# Patient Record
Sex: Female | Born: 1965 | Race: White | Hispanic: No | Smoking: Current some day smoker
Health system: Southern US, Community
[De-identification: ages and names within clinical notes are randomized; demographics above are authoritative.]

## PROBLEM LIST (undated history)

## (undated) DIAGNOSIS — N200 Calculus of kidney: Secondary | ICD-10-CM

## (undated) DIAGNOSIS — K429 Umbilical hernia without obstruction or gangrene: Secondary | ICD-10-CM

## (undated) DIAGNOSIS — I639 Cerebral infarction, unspecified: Secondary | ICD-10-CM

## (undated) DIAGNOSIS — R42 Dizziness and giddiness: Secondary | ICD-10-CM

## (undated) DIAGNOSIS — Z8489 Family history of other specified conditions: Secondary | ICD-10-CM

## (undated) DIAGNOSIS — G43409 Hemiplegic migraine, not intractable, without status migrainosus: Secondary | ICD-10-CM

## (undated) DIAGNOSIS — I2510 Atherosclerotic heart disease of native coronary artery without angina pectoris: Secondary | ICD-10-CM

## (undated) DIAGNOSIS — M9979 Connective tissue and disc stenosis of intervertebral foramina of abdomen and other regions: Secondary | ICD-10-CM

## (undated) DIAGNOSIS — K649 Unspecified hemorrhoids: Secondary | ICD-10-CM

## (undated) DIAGNOSIS — F32A Depression, unspecified: Secondary | ICD-10-CM

## (undated) DIAGNOSIS — M359 Systemic involvement of connective tissue, unspecified: Secondary | ICD-10-CM

## (undated) DIAGNOSIS — I2699 Other pulmonary embolism without acute cor pulmonale: Secondary | ICD-10-CM

## (undated) DIAGNOSIS — J329 Chronic sinusitis, unspecified: Secondary | ICD-10-CM

## (undated) DIAGNOSIS — K2270 Barrett's esophagus without dysplasia: Secondary | ICD-10-CM

## (undated) DIAGNOSIS — I739 Peripheral vascular disease, unspecified: Secondary | ICD-10-CM

## (undated) DIAGNOSIS — K635 Polyp of colon: Secondary | ICD-10-CM

## (undated) DIAGNOSIS — S060XAA Concussion with loss of consciousness status unknown, initial encounter: Secondary | ICD-10-CM

## (undated) DIAGNOSIS — T148XXA Other injury of unspecified body region, initial encounter: Secondary | ICD-10-CM

## (undated) DIAGNOSIS — Z87442 Personal history of urinary calculi: Secondary | ICD-10-CM

## (undated) DIAGNOSIS — M797 Fibromyalgia: Secondary | ICD-10-CM

## (undated) DIAGNOSIS — M329 Systemic lupus erythematosus, unspecified: Secondary | ICD-10-CM

## (undated) DIAGNOSIS — I6529 Occlusion and stenosis of unspecified carotid artery: Secondary | ICD-10-CM

## (undated) DIAGNOSIS — G4700 Insomnia, unspecified: Secondary | ICD-10-CM

## (undated) DIAGNOSIS — F419 Anxiety disorder, unspecified: Secondary | ICD-10-CM

## (undated) DIAGNOSIS — K219 Gastro-esophageal reflux disease without esophagitis: Secondary | ICD-10-CM

## (undated) DIAGNOSIS — S0990XS Unspecified injury of head, sequela: Secondary | ICD-10-CM

## (undated) DIAGNOSIS — I8390 Asymptomatic varicose veins of unspecified lower extremity: Secondary | ICD-10-CM

## (undated) DIAGNOSIS — M5136 Other intervertebral disc degeneration, lumbar region: Secondary | ICD-10-CM

## (undated) DIAGNOSIS — M51369 Other intervertebral disc degeneration, lumbar region without mention of lumbar back pain or lower extremity pain: Secondary | ICD-10-CM

## (undated) DIAGNOSIS — G43109 Migraine with aura, not intractable, without status migrainosus: Secondary | ICD-10-CM

## (undated) DIAGNOSIS — E785 Hyperlipidemia, unspecified: Secondary | ICD-10-CM

## (undated) DIAGNOSIS — N6019 Diffuse cystic mastopathy of unspecified breast: Secondary | ICD-10-CM

## (undated) DIAGNOSIS — I10 Essential (primary) hypertension: Secondary | ICD-10-CM

## (undated) DIAGNOSIS — M352 Behcet's disease: Secondary | ICD-10-CM

## (undated) DIAGNOSIS — S060X9A Concussion with loss of consciousness of unspecified duration, initial encounter: Secondary | ICD-10-CM

## (undated) DIAGNOSIS — IMO0001 Reserved for inherently not codable concepts without codable children: Secondary | ICD-10-CM

## (undated) DIAGNOSIS — K5790 Diverticulosis of intestine, part unspecified, without perforation or abscess without bleeding: Secondary | ICD-10-CM

## (undated) DIAGNOSIS — K589 Irritable bowel syndrome without diarrhea: Secondary | ICD-10-CM

## (undated) DIAGNOSIS — J189 Pneumonia, unspecified organism: Secondary | ICD-10-CM

## (undated) DIAGNOSIS — G4730 Sleep apnea, unspecified: Secondary | ICD-10-CM

## (undated) DIAGNOSIS — N390 Urinary tract infection, site not specified: Secondary | ICD-10-CM

## (undated) DIAGNOSIS — F329 Major depressive disorder, single episode, unspecified: Secondary | ICD-10-CM

## (undated) DIAGNOSIS — K760 Fatty (change of) liver, not elsewhere classified: Secondary | ICD-10-CM

## (undated) DIAGNOSIS — G43909 Migraine, unspecified, not intractable, without status migrainosus: Secondary | ICD-10-CM

## (undated) HISTORY — DX: Behcet's disease: M35.2

## (undated) HISTORY — DX: Reserved for inherently not codable concepts without codable children: IMO0001

## (undated) HISTORY — DX: Major depressive disorder, single episode, unspecified: F32.9

## (undated) HISTORY — DX: Chronic sinusitis, unspecified: J32.9

## (undated) HISTORY — DX: Asymptomatic varicose veins of unspecified lower extremity: I83.90

## (undated) HISTORY — PX: BREAST SURGERY: SHX581

## (undated) HISTORY — DX: Diverticulosis of intestine, part unspecified, without perforation or abscess without bleeding: K57.90

## (undated) HISTORY — DX: Hyperlipidemia, unspecified: E78.5

## (undated) HISTORY — DX: Peripheral vascular disease, unspecified: I73.9

## (undated) HISTORY — DX: Concussion with loss of consciousness of unspecified duration, initial encounter: S06.0X9A

## (undated) HISTORY — PX: COLONOSCOPY W/ BIOPSIES: SHX1374

## (undated) HISTORY — DX: Irritable bowel syndrome, unspecified: K58.9

## (undated) HISTORY — DX: Fatty (change of) liver, not elsewhere classified: K76.0

## (undated) HISTORY — DX: Atherosclerotic heart disease of native coronary artery without angina pectoris: I25.10

## (undated) HISTORY — DX: Urinary tract infection, site not specified: N39.0

## (undated) HISTORY — DX: Systemic lupus erythematosus, unspecified: M32.9

## (undated) HISTORY — DX: Depression, unspecified: F32.A

## (undated) HISTORY — DX: Polyp of colon: K63.5

## (undated) HISTORY — DX: Calculus of kidney: N20.0

## (undated) HISTORY — DX: Insomnia, unspecified: G47.00

## (undated) HISTORY — DX: Anxiety disorder, unspecified: F41.9

## (undated) HISTORY — DX: Occlusion and stenosis of unspecified carotid artery: I65.29

## (undated) HISTORY — DX: Diffuse cystic mastopathy of unspecified breast: N60.19

## (undated) HISTORY — DX: Cerebral infarction, unspecified: I63.9

## (undated) HISTORY — PX: OTHER SURGICAL HISTORY: SHX169

## (undated) HISTORY — DX: Other intervertebral disc degeneration, lumbar region: M51.36

## (undated) HISTORY — DX: Connective tissue and disc stenosis of intervertebral foramina of abdomen and other regions: M99.79

## (undated) HISTORY — PX: TUBAL LIGATION: SHX77

## (undated) HISTORY — DX: Concussion with loss of consciousness status unknown, initial encounter: S06.0XAA

## (undated) HISTORY — DX: Umbilical hernia without obstruction or gangrene: K42.9

## (undated) HISTORY — DX: Other intervertebral disc degeneration, lumbar region without mention of lumbar back pain or lower extremity pain: M51.369

## (undated) HISTORY — DX: Migraine with aura, not intractable, without status migrainosus: G43.109

## (undated) HISTORY — DX: Unspecified hemorrhoids: K64.9

---

## 1988-05-30 DIAGNOSIS — IMO0002 Reserved for concepts with insufficient information to code with codable children: Secondary | ICD-10-CM

## 1988-05-30 DIAGNOSIS — M329 Systemic lupus erythematosus, unspecified: Secondary | ICD-10-CM

## 1988-05-30 HISTORY — DX: Systemic lupus erythematosus, unspecified: M32.9

## 1988-05-30 HISTORY — DX: Reserved for concepts with insufficient information to code with codable children: IMO0002

## 1998-05-30 HISTORY — PX: BREAST EXCISIONAL BIOPSY: SUR124

## 2001-05-30 DIAGNOSIS — I6529 Occlusion and stenosis of unspecified carotid artery: Secondary | ICD-10-CM

## 2001-05-30 HISTORY — DX: Occlusion and stenosis of unspecified carotid artery: I65.29

## 2007-05-31 DIAGNOSIS — IMO0001 Reserved for inherently not codable concepts without codable children: Secondary | ICD-10-CM

## 2007-05-31 HISTORY — DX: Reserved for inherently not codable concepts without codable children: IMO0001

## 2008-08-19 LAB — HM COLONOSCOPY

## 2009-05-30 DIAGNOSIS — M9979 Connective tissue and disc stenosis of intervertebral foramina of abdomen and other regions: Secondary | ICD-10-CM

## 2009-05-30 HISTORY — DX: Connective tissue and disc stenosis of intervertebral foramina of abdomen and other regions: M99.79

## 2009-09-10 LAB — HM PAP SMEAR: HM Pap smear: NORMAL

## 2010-12-12 LAB — HM PAP SMEAR: HM Pap smear: NORMAL

## 2011-05-31 DIAGNOSIS — N200 Calculus of kidney: Secondary | ICD-10-CM

## 2011-05-31 DIAGNOSIS — K429 Umbilical hernia without obstruction or gangrene: Secondary | ICD-10-CM

## 2011-05-31 DIAGNOSIS — K760 Fatty (change of) liver, not elsewhere classified: Secondary | ICD-10-CM

## 2011-05-31 HISTORY — DX: Fatty (change of) liver, not elsewhere classified: K76.0

## 2011-05-31 HISTORY — DX: Calculus of kidney: N20.0

## 2011-05-31 HISTORY — DX: Umbilical hernia without obstruction or gangrene: K42.9

## 2011-07-29 LAB — HM COLONOSCOPY

## 2012-03-22 LAB — HM MAMMOGRAPHY

## 2014-05-08 ENCOUNTER — Encounter: Payer: Self-pay | Admitting: Podiatry

## 2014-05-08 ENCOUNTER — Ambulatory Visit (INDEPENDENT_AMBULATORY_CARE_PROVIDER_SITE_OTHER): Payer: BC Managed Care – PPO

## 2014-05-08 ENCOUNTER — Ambulatory Visit (INDEPENDENT_AMBULATORY_CARE_PROVIDER_SITE_OTHER): Payer: BC Managed Care – PPO | Admitting: Podiatry

## 2014-05-08 VITALS — BP 156/100 | HR 88 | Resp 16 | Ht 65.0 in | Wt 260.0 lb

## 2014-05-08 DIAGNOSIS — M722 Plantar fascial fibromatosis: Secondary | ICD-10-CM

## 2014-05-08 DIAGNOSIS — M79672 Pain in left foot: Secondary | ICD-10-CM

## 2014-05-08 MED ORDER — MELOXICAM 7.5 MG PO TABS: 7.5000 mg | ORAL_TABLET | Freq: Every day | ORAL | Status: DC

## 2014-05-08 NOTE — Progress Notes (Signed)
Subjective:    Patient ID: Shirley Rogers, female    DOB: [birth year], 48 y.o.   MRN: [id number]  HPI Comments: 48 year old female presents the office today with complaints of left heel pain which has been ongoing for approximate one month. 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 She states that she can barely put pressure on her foot without any discomfort. She states that although the pain is in present for approximate one month and has become progressively worse. She does have pain particularly in the mornings or after periods of rest. She is previously been diagnosed with plantar fasciitis while living in [place] and she has had a steroid injection which helped. She states that this pain is about the same as it was prior although worse. She states this feels as if she is walking on glass and almost feels like a bone is "breaking". She's been performing stretching exercises as well as rolling a cold water bottle under her foot and wearing various inserts in her shoes without any resolution of symptoms. She is also been soaking in Epson salts. She denies any recent injury or trauma to the area.   Patient also has secondary complaints stating that she feels as if the second MTPJ  (which she pointed to) is painful and becoming  "dislocated". Denies any recent injury or trauma to the area. No prior treatment for this. No other complaints at this time.  Foot Pain Associated symptoms include fatigue, headaches and weakness.      Review of Systems  Constitutional: Positive for fatigue.  HENT: Positive for ear pain.   Gastrointestinal:       Bloating   Musculoskeletal: Positive for back pain.       Joint pain Rash Difficulty walking Muscle pain   Allergic/Immunologic: Positive for environmental allergies.  Neurological: Positive for weakness and headaches.  Hematological: Bruises/bleeds easily.  All other systems reviewed and are negative.      Objective:    Physical Exam AAO x3, NAD DP/PT pulses palpable bilaterally, CRT less than 3 seconds Protective sensation intact with Simms Weinstein monofilament, vibratory sensation intact, Achilles tendon reflex intact Tenderness palpation along the plantar medial tubercle of the calcaneus at the insertion of the plantar fashion left foot. There is no pain along the course of plantar fascial in the arch of the foot. There is no pain with lateral compression of the calcaneus or pain with vibratory sensation. No pain on the posterior aspect of the calcaneus or pain on the course last insertion of the Achilles tendon. MMT 5/5, ROM WNL. There is no overlying edema, erythema, increased warmth. Mild edema overlying the second MTPJ on the left foot. There is mild pain with range of motion of second MTPJ. There is no pain with palpation or vibratory sensation are on the digit or metatarsals No overlying erythema or increase in warmth.  No open lesions or pre-ulcerative lesions. No pain with calf compression, swelling, warmth, erythema.       Assessment & Plan:  48 year old female with left heel pain, likely plantar fasciitis vs. stress fracture; left second MTPJ likely capsulitis. -X-rays were obtained and reviewed with the patient. -At this time will immobilize and a CAM walker. Risks, complications of immobilization including DVT/PE were discussed the patient injection to the emergency room should any occur. -Ice to the area. -Prescribed meloxicam. -Discussed possible steroid injection however we'll hold off until next appointment we'll repeat x-rays. -Follow-up in  2 weeks for repeat x-rays or sooner if any problems are to arise. In the meantime, call the office in the questions, concerns, change in symptoms. Follow-up with PCP for other issues mentioned in the review of systems.

## 2014-05-19 ENCOUNTER — Ambulatory Visit: Payer: Self-pay | Admitting: Family Medicine

## 2014-05-19 ENCOUNTER — Encounter: Payer: Self-pay | Admitting: Family Medicine

## 2014-05-20 NOTE — Progress Notes (Signed)
error 

## 2014-05-25 DIAGNOSIS — W19XXXA Unspecified fall, initial encounter: Secondary | ICD-10-CM | POA: Insufficient documentation

## 2014-05-27 ENCOUNTER — Ambulatory Visit (INDEPENDENT_AMBULATORY_CARE_PROVIDER_SITE_OTHER): Payer: BC Managed Care – PPO | Admitting: Podiatry

## 2014-05-27 ENCOUNTER — Ambulatory Visit (INDEPENDENT_AMBULATORY_CARE_PROVIDER_SITE_OTHER): Payer: BC Managed Care – PPO

## 2014-05-27 VITALS — BP 140/90 | HR 94 | Resp 16

## 2014-05-27 DIAGNOSIS — M722 Plantar fascial fibromatosis: Secondary | ICD-10-CM

## 2014-05-27 MED ORDER — MELOXICAM 15 MG PO TABS: 15.0000 mg | ORAL_TABLET | Freq: Every day | ORAL | Status: DC

## 2014-05-27 MED ORDER — TRIAMCINOLONE ACETONIDE 10 MG/ML IJ SUSP
10.0000 mg | Freq: Once | INTRAMUSCULAR | Status: AC
  Administered 2014-05-27: 10 mg

## 2014-05-27 NOTE — Patient Instructions (Signed)
Plantar Fasciitis (Heel Spur Syndrome) with Rehab The plantar fascia is a fibrous, ligament-like, soft-tissue structure that spans the bottom of the foot. Plantar fasciitis is a condition that causes pain in the foot due to inflammation of the tissue. SYMPTOMS   Pain and tenderness on the underneath side of the foot.  Pain that worsens with standing or walking. CAUSES  Plantar fasciitis is caused by irritation and injury to the plantar fascia on the underneath side of the foot. Common mechanisms of injury include:  Direct trauma to bottom of the foot.  Damage to a small nerve that runs under the foot where the main fascia attaches to the heel bone.  Stress placed on the plantar fascia due to bone spurs. RISK INCREASES WITH:   Activities that place stress on the plantar fascia (running, jumping, pivoting, or cutting).  Poor strength and flexibility.  Improperly fitted shoes.  Tight calf muscles.  Flat feet.  Failure to warm-up properly before activity.  Obesity. PREVENTION  Warm up and stretch properly before activity.  Allow for adequate recovery between workouts.  Maintain physical fitness:  Strength, flexibility, and endurance.  Cardiovascular fitness.  Maintain a health body weight.  Avoid stress on the plantar fascia.  Wear properly fitted shoes, including arch supports for individuals who have flat feet. PROGNOSIS  If treated properly, then the symptoms of plantar fasciitis usually resolve without surgery. However, occasionally surgery is necessary. RELATED COMPLICATIONS   Recurrent symptoms that may result in a chronic condition.  Problems of the lower back that are caused by compensating for the injury, such as limping.  Pain or weakness of the foot during push-off following surgery.  Chronic inflammation, scarring, and partial or complete fascia tear, occurring more often from repeated injections. TREATMENT  Treatment initially involves the use of  ice and medication to help reduce pain and inflammation. The use of strengthening and stretching exercises may help reduce pain with activity, especially stretches of the Achilles tendon. These exercises may be performed at home or with a therapist. Your caregiver may recommend that you use heel cups of arch supports to help reduce stress on the plantar fascia. Occasionally, corticosteroid injections are given to reduce inflammation. If symptoms persist for greater than 6 months despite non-surgical (conservative), then surgery may be recommended.  MEDICATION   If pain medication is necessary, then nonsteroidal anti-inflammatory medications, such as aspirin and ibuprofen, or other minor pain relievers, such as acetaminophen, are often recommended.  Do not take pain medication within 7 days before surgery.  Prescription pain relievers may be given if deemed necessary by your caregiver. Use only as directed and only as much as you need.  Corticosteroid injections may be given by your caregiver. These injections should be reserved for the most serious cases, because they may only be given a certain number of times. HEAT AND COLD  Cold treatment (icing) relieves pain and reduces inflammation. Cold treatment should be applied for 10 to 15 minutes every 2 to 3 hours for inflammation and pain and immediately after any activity that aggravates your symptoms. Use ice packs or massage the area with a piece of ice (ice massage).  Heat treatment may be used prior to performing the stretching and strengthening activities prescribed by your caregiver, physical therapist, or athletic trainer. Use a heat pack or soak the injury in warm water. SEEK IMMEDIATE MEDICAL CARE IF:  Treatment seems to offer no benefit, or the condition worsens.  Any medications produce adverse side effects. EXERCISES RANGE   OF MOTION (ROM) AND STRETCHING EXERCISES - Plantar Fasciitis (Heel Spur Syndrome) These exercises may help you  when beginning to rehabilitate your injury. Your symptoms may resolve with or without further involvement from your physician, physical therapist or athletic trainer. While completing these exercises, remember:   Restoring tissue flexibility helps normal motion to return to the joints. This allows healthier, less painful movement and activity.  An effective stretch should be held for at least 30 seconds.  A stretch should never be painful. You should only feel a gentle lengthening or release in the stretched tissue. RANGE OF MOTION - Toe Extension, Flexion  Sit with your right / left leg crossed over your opposite knee.  Grasp your toes and gently pull them back toward the top of your foot. You should feel a stretch on the bottom of your toes and/or foot.  Hold this stretch for __________ seconds.  Now, gently pull your toes toward the bottom of your foot. You should feel a stretch on the top of your toes and or foot.  Hold this stretch for __________ seconds. Repeat __________ times. Complete this stretch __________ times per day.  RANGE OF MOTION - Ankle Dorsiflexion, Active Assisted  Remove shoes and sit on a chair that is preferably not on a carpeted surface.  Place right / left foot under knee. Extend your opposite leg for support.  Keeping your heel down, slide your right / left foot back toward the chair until you feel a stretch at your ankle or calf. If you do not feel a stretch, slide your bottom forward to the edge of the chair, while still keeping your heel down.  Hold this stretch for __________ seconds. Repeat __________ times. Complete this stretch __________ times per day.  STRETCH - Gastroc, Standing  Place hands on wall.  Extend right / left leg, keeping the front knee somewhat bent.  Slightly point your toes inward on your back foot.  Keeping your right / left heel on the floor and your knee straight, shift your weight toward the wall, not allowing your back to  arch.  You should feel a gentle stretch in the right / left calf. Hold this position for __________ seconds. Repeat __________ times. Complete this stretch __________ times per day. STRETCH - Soleus, Standing  Place hands on wall.  Extend right / left leg, keeping the other knee somewhat bent.  Slightly point your toes inward on your back foot.  Keep your right / left heel on the floor, bend your back knee, and slightly shift your weight over the back leg so that you feel a gentle stretch deep in your back calf.  Hold this position for __________ seconds. Repeat __________ times. Complete this stretch __________ times per day. STRETCH - Gastrocsoleus, Standing  Note: This exercise can place a lot of stress on your foot and ankle. Please complete this exercise only if specifically instructed by your caregiver.   Place the ball of your right / left foot on a step, keeping your other foot firmly on the same step.  Hold on to the wall or a rail for balance.  Slowly lift your other foot, allowing your body weight to press your heel down over the edge of the step.  You should feel a stretch in your right / left calf.  Hold this position for __________ seconds.  Repeat this exercise with a slight bend in your right / left knee. Repeat __________ times. Complete this stretch __________ times per day.    STRENGTHENING EXERCISES - Plantar Fasciitis (Heel Spur Syndrome)  These exercises may help you when beginning to rehabilitate your injury. They may resolve your symptoms with or without further involvement from your physician, physical therapist or athletic trainer. While completing these exercises, remember:   Muscles can gain both the endurance and the strength needed for everyday activities through controlled exercises.  Complete these exercises as instructed by your physician, physical therapist or athletic trainer. Progress the resistance and repetitions only as guided. STRENGTH -  Towel Curls  Sit in a chair positioned on a non-carpeted surface.  Place your foot on a towel, keeping your heel on the floor.  Pull the towel toward your heel by only curling your toes. Keep your heel on the floor.  If instructed by your physician, physical therapist or athletic trainer, add ____________________ at the end of the towel. Repeat __________ times. Complete this exercise __________ times per day. STRENGTH - Ankle Inversion  Secure one end of a rubber exercise band/tubing to a fixed object (table, pole). Loop the other end around your foot just before your toes.  Place your fists between your knees. This will focus your strengthening at your ankle.  Slowly, pull your big toe up and in, making sure the band/tubing is positioned to resist the entire motion.  Hold this position for __________ seconds.  Have your muscles resist the band/tubing as it slowly pulls your foot back to the starting position. Repeat __________ times. Complete this exercises __________ times per day.  Document Released: 05/16/2005 Document Revised: 08/08/2011 Document Reviewed: 08/28/2008 [employer] Patient Information 2015 [employer]. This information is not intended to replace advice given to you by your health care provider. Make sure you discuss any questions you have with your health care provider.  

## 2014-05-27 NOTE — Progress Notes (Signed)
Patient ID: Shirley Rogers, female   DOB: 1965/06/30, 48 y.o.   MRN: [id number]  Subjective: 48 year old female returns the office in follow-up evaluation of left heel pain. The patient has been immobilized in a cam boot. She states that the pain is somewhat better although it persists. She's been taking the meloxicam without any difficulties. She denies any acute changes since last appointment and no other complaints at this time. Denies any recent injury or trauma to the area. No fevers, chills, nausea, vomiting or other systemic complaints. No other complaints at this time.  Objective: AAO 3, NAD DP/PT pulses palpable, CRT less than 3 seconds Protective sensation intact with Simms Weinstein monofilament, vibratory sensation intact, Achilles tendon reflex intact. Tenderness to palpation overlying the plantar medial tubercle of the calcaneus on the left foot at the insertion of the plantar fascia. There is no pain on the course of plantar fascial within the arch of the foot and the plantar fascia appears to be intact. There is no pain with lateral compression of the calcaneus or pain with vibratory sensation. No pain along the posterior aspect of the calcaneus or along the course/insertion of the Achilles tendon. There is no overlying edema, erythema, increase in warmth. No other areas of pinpoint bony tenderness or pain with vibratory sensation. MMT 5/5, ROM WNL No open lesions or pre-ulcerative lesions. No pain with calf compression, swelling, warmth, erythema.  Assessment: 48 year old female left heel pain, likely plantar fasciitis  Plan: -New x-rays were obtained and reviewed the patient. -Treatment options were discussed including alternatives, risks, competitions. -Patient elects to proceed with steroid injection into the left plantar heel. Under sterile skin preparation, a total of 2.5cc of kenalog 10, 0.5% Marcaine plain, and 2% lidocaine plain were infiltrated into the symptomatic area  without complication. A band-aid was applied. Patient tolerated the injection well without complication. Discussed the patient to ice the area over the next couple days to help prevent a steroid flare. -Dispensed plantar fascial brace. -Patient can transition into a regular sneaker as tolerated with the plantar fascial brace. -Continue ice and stretching exercises. -Continue meloxicam. A new prescription was sent to the pharmacy. -Discussed shoe gear modifications and possible orthotics. -Follow-up in 3 weeks or sooner should any problems arise. In the meantime, call the office with any questions, concerns, change in symptoms.

## 2014-06-07 ENCOUNTER — Emergency Department: Payer: Self-pay | Admitting: Emergency Medicine

## 2014-06-07 LAB — URINALYSIS, COMPLETE
Bilirubin,UR: NEGATIVE
GLUCOSE, UR: NEGATIVE mg/dL (ref 0–75)
Ketone: NEGATIVE
LEUKOCYTE ESTERASE: NEGATIVE
NITRITE: NEGATIVE
PH: 6 (ref 4.5–8.0)
Protein: 100
RBC,UR: 3547 /HPF (ref 0–5)
SPECIFIC GRAVITY: 1.005 (ref 1.003–1.030)
Squamous Epithelial: 5
WBC UR: 120 /HPF (ref 0–5)

## 2014-06-07 LAB — COMPREHENSIVE METABOLIC PANEL
ALT: 41 U/L
Albumin: 4.2 g/dL (ref 3.4–5.0)
Alkaline Phosphatase: 79 U/L
Anion Gap: 7 (ref 7–16)
BILIRUBIN TOTAL: 0.4 mg/dL (ref 0.2–1.0)
BUN: 9 mg/dL (ref 7–18)
CALCIUM: 8.8 mg/dL (ref 8.5–10.1)
CHLORIDE: 102 mmol/L (ref 98–107)
CREATININE: 0.87 mg/dL (ref 0.60–1.30)
Co2: 29 mmol/L (ref 21–32)
EGFR (Non-African Amer.): >60
Glucose: 103 mg/dL — ABNORMAL HIGH (ref 65–99)
Osmolality: 275 (ref 275–301)
Potassium: 4 mmol/L (ref 3.5–5.1)
SGOT(AST): 22 U/L (ref 15–37)
Sodium: 138 mmol/L (ref 136–145)
TOTAL PROTEIN: 7.9 g/dL (ref 6.4–8.2)

## 2014-06-07 LAB — CBC WITH DIFFERENTIAL/PLATELET
BASOS PCT: 0.8 %
Basophil #: 0.1 10*3/uL (ref 0.0–0.1)
EOS PCT: 1.6 %
Eosinophil #: 0.1 10*3/uL (ref 0.0–0.7)
HCT: 44.1 % (ref 35.0–47.0)
HGB: 14.5 g/dL (ref 12.0–16.0)
LYMPHS PCT: 24.3 %
Lymphocyte #: 2 10*3/uL (ref 1.0–3.6)
MCH: 29.8 pg (ref 26.0–34.0)
MCHC: 32.9 g/dL (ref 32.0–36.0)
MCV: 91 fL (ref 80–100)
Monocyte #: 0.5 x10 3/mm (ref 0.2–0.9)
Monocyte %: 5.8 %
NEUTROS PCT: 67.5 %
Neutrophil #: 5.5 10*3/uL (ref 1.4–6.5)
PLATELETS: 245 10*3/uL (ref 150–440)
RBC: 4.85 10*6/uL (ref 3.80–5.20)
RDW: 13 % (ref 11.5–14.5)
WBC: 8.1 10*3/uL (ref 3.6–11.0)

## 2014-06-07 LAB — HCG, QUANTITATIVE, PREGNANCY: Beta Hcg, Quant.: <1 m[IU]/mL — ABNORMAL LOW

## 2014-06-07 LAB — GC/CHLAMYDIA PROBE AMP

## 2014-06-17 ENCOUNTER — Ambulatory Visit (INDEPENDENT_AMBULATORY_CARE_PROVIDER_SITE_OTHER): Payer: BC Managed Care – PPO | Admitting: Podiatry

## 2014-06-17 VITALS — BP 148/93 | HR 89 | Resp 16

## 2014-06-17 DIAGNOSIS — M722 Plantar fascial fibromatosis: Secondary | ICD-10-CM

## 2014-06-17 MED ORDER — TRIAMCINOLONE ACETONIDE 10 MG/ML IJ SUSP: 10.0000 mg | Freq: Once | INTRAMUSCULAR | Status: DC

## 2014-06-17 NOTE — Patient Instructions (Signed)
Plantar Fasciitis (Heel Spur Syndrome) with Rehab The plantar fascia is a fibrous, ligament-like, soft-tissue structure that spans the bottom of the foot. Plantar fasciitis is a condition that causes pain in the foot due to inflammation of the tissue. SYMPTOMS   Pain and tenderness on the underneath side of the foot.  Pain that worsens with standing or walking. CAUSES  Plantar fasciitis is caused by irritation and injury to the plantar fascia on the underneath side of the foot. Common mechanisms of injury include:  Direct trauma to bottom of the foot.  Damage to a small nerve that runs under the foot where the main fascia attaches to the heel bone.  Stress placed on the plantar fascia due to bone spurs. RISK INCREASES WITH:   Activities that place stress on the plantar fascia (running, jumping, pivoting, or cutting).  Poor strength and flexibility.  Improperly fitted shoes.  Tight calf muscles.  Flat feet.  Failure to warm-up properly before activity.  Obesity. PREVENTION  Warm up and stretch properly before activity.  Allow for adequate recovery between workouts.  Maintain physical fitness:  Strength, flexibility, and endurance.  Cardiovascular fitness.  Maintain a health body weight.  Avoid stress on the plantar fascia.  Wear properly fitted shoes, including arch supports for individuals who have flat feet. PROGNOSIS  If treated properly, then the symptoms of plantar fasciitis usually resolve without surgery. However, occasionally surgery is necessary. RELATED COMPLICATIONS   Recurrent symptoms that may result in a chronic condition.  Problems of the lower back that are caused by compensating for the injury, such as limping.  Pain or weakness of the foot during push-off following surgery.  Chronic inflammation, scarring, and partial or complete fascia tear, occurring more often from repeated injections. TREATMENT  Treatment initially involves the use of  ice and medication to help reduce pain and inflammation. The use of strengthening and stretching exercises may help reduce pain with activity, especially stretches of the Achilles tendon. These exercises may be performed at home or with a therapist. Your caregiver may recommend that you use heel cups of arch supports to help reduce stress on the plantar fascia. Occasionally, corticosteroid injections are given to reduce inflammation. If symptoms persist for greater than 6 months despite non-surgical (conservative), then surgery may be recommended.  MEDICATION   If pain medication is necessary, then nonsteroidal anti-inflammatory medications, such as aspirin and ibuprofen, or other minor pain relievers, such as acetaminophen, are often recommended.  Do not take pain medication within 7 days before surgery.  Prescription pain relievers may be given if deemed necessary by your caregiver. Use only as directed and only as much as you need.  Corticosteroid injections may be given by your caregiver. These injections should be reserved for the most serious cases, because they may only be given a certain number of times. HEAT AND COLD  Cold treatment (icing) relieves pain and reduces inflammation. Cold treatment should be applied for 10 to 15 minutes every 2 to 3 hours for inflammation and pain and immediately after any activity that aggravates your symptoms. Use ice packs or massage the area with a piece of ice (ice massage).  Heat treatment may be used prior to performing the stretching and strengthening activities prescribed by your caregiver, physical therapist, or athletic trainer. Use a heat pack or soak the injury in warm water. SEEK IMMEDIATE MEDICAL CARE IF:  Treatment seems to offer no benefit, or the condition worsens.  Any medications produce adverse side effects. EXERCISES RANGE   OF MOTION (ROM) AND STRETCHING EXERCISES - Plantar Fasciitis (Heel Spur Syndrome) These exercises may help you  when beginning to rehabilitate your injury. Your symptoms may resolve with or without further involvement from your physician, physical therapist or athletic trainer. While completing these exercises, remember:   Restoring tissue flexibility helps normal motion to return to the joints. This allows healthier, less painful movement and activity.  An effective stretch should be held for at least 30 seconds.  A stretch should never be painful. You should only feel a gentle lengthening or release in the stretched tissue. RANGE OF MOTION - Toe Extension, Flexion  Sit with your right / left leg crossed over your opposite knee.  Grasp your toes and gently pull them back toward the top of your foot. You should feel a stretch on the bottom of your toes and/or foot.  Hold this stretch for __________ seconds.  Now, gently pull your toes toward the bottom of your foot. You should feel a stretch on the top of your toes and or foot.  Hold this stretch for __________ seconds. Repeat __________ times. Complete this stretch __________ times per day.  RANGE OF MOTION - Ankle Dorsiflexion, Active Assisted  Remove shoes and sit on a chair that is preferably not on a carpeted surface.  Place right / left foot under knee. Extend your opposite leg for support.  Keeping your heel down, slide your right / left foot back toward the chair until you feel a stretch at your ankle or calf. If you do not feel a stretch, slide your bottom forward to the edge of the chair, while still keeping your heel down.  Hold this stretch for __________ seconds. Repeat __________ times. Complete this stretch __________ times per day.  STRETCH - Gastroc, Standing  Place hands on wall.  Extend right / left leg, keeping the front knee somewhat bent.  Slightly point your toes inward on your back foot.  Keeping your right / left heel on the floor and your knee straight, shift your weight toward the wall, not allowing your back to  arch.  You should feel a gentle stretch in the right / left calf. Hold this position for __________ seconds. Repeat __________ times. Complete this stretch __________ times per day. STRETCH - Soleus, Standing  Place hands on wall.  Extend right / left leg, keeping the other knee somewhat bent.  Slightly point your toes inward on your back foot.  Keep your right / left heel on the floor, bend your back knee, and slightly shift your weight over the back leg so that you feel a gentle stretch deep in your back calf.  Hold this position for __________ seconds. Repeat __________ times. Complete this stretch __________ times per day. STRETCH - Gastrocsoleus, Standing  Note: This exercise can place a lot of stress on your foot and ankle. Please complete this exercise only if specifically instructed by your caregiver.   Place the ball of your right / left foot on a step, keeping your other foot firmly on the same step.  Hold on to the wall or a rail for balance.  Slowly lift your other foot, allowing your body weight to press your heel down over the edge of the step.  You should feel a stretch in your right / left calf.  Hold this position for __________ seconds.  Repeat this exercise with a slight bend in your right / left knee. Repeat __________ times. Complete this stretch __________ times per day.    STRENGTHENING EXERCISES - Plantar Fasciitis (Heel Spur Syndrome)  These exercises may help you when beginning to rehabilitate your injury. They may resolve your symptoms with or without further involvement from your physician, physical therapist or athletic trainer. While completing these exercises, remember:   Muscles can gain both the endurance and the strength needed for everyday activities through controlled exercises.  Complete these exercises as instructed by your physician, physical therapist or athletic trainer. Progress the resistance and repetitions only as guided. STRENGTH -  Towel Curls  Sit in a chair positioned on a non-carpeted surface.  Place your foot on a towel, keeping your heel on the floor.  Pull the towel toward your heel by only curling your toes. Keep your heel on the floor.  If instructed by your physician, physical therapist or athletic trainer, add ____________________ at the end of the towel. Repeat __________ times. Complete this exercise __________ times per day. STRENGTH - Ankle Inversion  Secure one end of a rubber exercise band/tubing to a fixed object (table, pole). Loop the other end around your foot just before your toes.  Place your fists between your knees. This will focus your strengthening at your ankle.  Slowly, pull your big toe up and in, making sure the band/tubing is positioned to resist the entire motion.  Hold this position for __________ seconds.  Have your muscles resist the band/tubing as it slowly pulls your foot back to the starting position. Repeat __________ times. Complete this exercises __________ times per day.  Document Released: 05/16/2005 Document Revised: 08/08/2011 Document Reviewed: 08/28/2008 [employer] Patient Information 2015 [employer]. This information is not intended to replace advice given to you by your health care provider. Make sure you discuss any questions you have with your health care provider.  

## 2014-06-18 NOTE — Progress Notes (Signed)
Patient ID: Shirley Rogers, female   DOB: [birth year], 49 y.o.   MRN: [id number]  Subjective: 49 year old female presented to the office they for follow-up evaluation of left heel pain and plantar fasciitis. She does state that her pain has improved greatly compared to last appointment although she does continue to have some discomfort into the heel. She's been continuing with the stretching, icing and various over-the-counter braces. Denies any systemic complaints such as fevers, chills, nausea, vomiting. No acute changes since last appointment, and no other complaints at this time.   Objective: AAO x3, NAD DP/PT pulses palpable bilaterally, CRT less than 3 seconds Protective sensation intact with Simms Weinstein monofilament, vibratory sensation intact, Achilles tendon reflex intact There is tenderness palpation overlying the plantar medial tubercle of the calcaneus at the left foot on the insertion of the plantar fascia. There is no pain along the course of plantar fascial in the arch of the foot and the ligament appears to be intact. There is no pain along the posterior aspect of the calcaneus from a core/insertion of the Achilles tendon. No pain with lateral compression of the calcaneus or pain with vibratory sensation. No other areas of tenderness to bilateral lower extremities. MMT 5/5, ROM WNL. No edema, erythema, increase in warmth to bilateral lower extremities.  No open lesions or pre-ulcerative lesions.  No pain with calf compression, swelling, warmth, erythema  Assessment: 49 year old female with resolving left heel pain, plantar fasciitis.  Plan: -All treatment options discussed with the patient including all alternatives, risks, complications.  -Patient elects to proceed with steroid injection into the left plantar heel. Under sterile skin preparation, a total of 2.5cc of kenalog 10, 0.5% Marcaine plain, and 2% lidocaine plain were infiltrated into the symptomatic area without  complication. A band-aid was applied. Patient tolerated the injection well without complication. Post-injection care with discussed with the patient. Discussed with the patient to ice the area over the next couple of days to help prevent a steroid flare.  -Plantar fascial taking was applied. -Continue with the stretching, icing, braces. -Continue his shoe gear modifications and inserts. -Follow-up in 3 weeks or sooner should any problems arise. -Patient encouraged to call the office with any questions, concerns, change in symptoms.

## 2014-07-15 ENCOUNTER — Ambulatory Visit: Payer: BC Managed Care – PPO | Admitting: Podiatry

## 2014-07-17 ENCOUNTER — Ambulatory Visit (INDEPENDENT_AMBULATORY_CARE_PROVIDER_SITE_OTHER): Payer: BC Managed Care – PPO | Admitting: Podiatry

## 2014-07-17 VITALS — BP 134/79 | HR 93 | Resp 16

## 2014-07-17 DIAGNOSIS — S99922S Unspecified injury of left foot, sequela: Secondary | ICD-10-CM

## 2014-07-17 DIAGNOSIS — M722 Plantar fascial fibromatosis: Secondary | ICD-10-CM

## 2014-07-17 DIAGNOSIS — S8992XS Unspecified injury of left lower leg, sequela: Secondary | ICD-10-CM

## 2014-07-17 DIAGNOSIS — M779 Enthesopathy, unspecified: Secondary | ICD-10-CM

## 2014-07-20 NOTE — Progress Notes (Signed)
Patient ID: Shirley Rogers, female   DOB: Oct 04, 1965, 49 y.o.   MRN: [id number]  Subjective: 49 year old female presented to the office they for follow-up evaluation of left heel pain/plantar fasciitis and symptomatic left second digit. She states that since last appointment her heel pain has resolved and she no longer has any issues with the area. She does that she continues to have some discomfort overlying the left second digit particularly with ambulation and weightbearing. She states that she gets intermittent swelling overlying the second toe joint and discomfort. Denies any recent injury or trauma. No other complaints at this time. No acute changes since last appointment. Denies any systemic complaints as fevers, chills, nausea, vomiting.  Objective: AAO x3, NAD DP/PT pulses palpable bilaterally, CRT less than 3 seconds Protective sensation intact with Simms Weinstein monofilament, vibratory sensation intact, Achilles tendon reflex intact There is no tenderness to palpation overlying the plantar medial tubercle of the calcaneus at the left foot at the insertion of the plantar fascia at this time. There is no pain along the course of plantar fascial in the arch of the foot and the ligament appears to be intact. There is no pain along the posterior aspect of the calcaneus from a core/insertion of the Achilles tendon. No pain with lateral compression of the calcaneus or pain with vibratory sensation. There is mild discomfort along the second MTPJ plantarly with a trace amount of edema around both the plantar and dorsal aspect of the joint. There is no overlying erythema or increase in warmth. There is mild pain with range of motion of the second MTPJ. Upon performing the Lachman test, the left 2nd MTPJ did not appear to be grossly unstable compared to the right.  No other areas of tenderness to bilateral lower extremities. MMT 5/5, ROM WNL. No edema, erythema, increase in warmth to bilateral lower  extremities.  No open lesions or pre-ulcerative lesions.  No pain with calf compression, swelling, warmth, erythema  Assessment: 49 year old female withresolved left plantar fasciitis; left second toe pain, likely early plantar plate injury/capsulitis   Plan: -All treatment options discussed with the patient including all alternatives, risks, complications.  -In regards the plantar fasciitis recommended her to continue the stretching and icing activities as well she gear modifications to help prevent recurrence. -Regards the left second digit discussed likely etiology of her symptoms. Continue anti-inflammatory medications as needed. Discussed with her how to tape the digit down to help take pressure off the plantar plate. Would hold off on steroid injection at this time. Discussed with her long-term sequela of this kind of injury. -Follow-up in 4 weeks if symptoms haven't resolved. In the meantime, encouraged to call the office with any questions, concerns, change in symptoms. -Patient encouraged to call the office with any questions, concerns, change in symptoms.

## 2014-07-22 ENCOUNTER — Emergency Department: Payer: Self-pay | Admitting: Emergency Medicine

## 2014-11-04 ENCOUNTER — Other Ambulatory Visit: Payer: Self-pay

## 2014-11-04 ENCOUNTER — Emergency Department: Payer: Worker's Compensation

## 2014-11-04 ENCOUNTER — Encounter: Payer: Self-pay | Admitting: *Deleted

## 2014-11-04 ENCOUNTER — Emergency Department
Admission: EM | Admit: 2014-11-04 | Discharge: 2014-11-04 | Disposition: A | Discharge status: Home / Self Care | Payer: Worker's Compensation | Attending: Emergency Medicine | Admitting: Emergency Medicine

## 2014-11-04 DIAGNOSIS — G43109 Migraine with aura, not intractable, without status migrainosus: Secondary | ICD-10-CM | POA: Insufficient documentation

## 2014-11-04 DIAGNOSIS — I10 Essential (primary) hypertension: Secondary | ICD-10-CM | POA: Diagnosis not present

## 2014-11-04 DIAGNOSIS — Z720 Tobacco use: Secondary | ICD-10-CM | POA: Diagnosis not present

## 2014-11-04 DIAGNOSIS — M6281 Muscle weakness (generalized): Secondary | ICD-10-CM | POA: Diagnosis not present

## 2014-11-04 DIAGNOSIS — Z791 Long term (current) use of non-steroidal anti-inflammatories (NSAID): Secondary | ICD-10-CM | POA: Insufficient documentation

## 2014-11-04 DIAGNOSIS — R51 Headache: Secondary | ICD-10-CM | POA: Diagnosis present

## 2014-11-04 HISTORY — DX: Hemiplegic migraine, not intractable, without status migrainosus: G43.409

## 2014-11-04 HISTORY — DX: Barrett's esophagus without dysplasia: K22.70

## 2014-11-04 HISTORY — DX: Essential (primary) hypertension: I10

## 2014-11-04 MED ORDER — DIPHENHYDRAMINE HCL 50 MG/ML IJ SOLN
INTRAMUSCULAR | Status: AC
  Administered 2014-11-04: 50 mg via INTRAVENOUS
  Filled 2014-11-04: qty 1

## 2014-11-04 MED ORDER — KETOROLAC TROMETHAMINE 30 MG/ML IJ SOLN
INTRAMUSCULAR | Status: AC
  Administered 2014-11-04: 30 mg via INTRAVENOUS
  Filled 2014-11-04: qty 1

## 2014-11-04 MED ORDER — DIPHENHYDRAMINE HCL 50 MG/ML IJ SOLN
50.0000 mg | Freq: Once | INTRAMUSCULAR | Status: AC
  Administered 2014-11-04: 50 mg via INTRAVENOUS

## 2014-11-04 MED ORDER — METHYLPREDNISOLONE SODIUM SUCC 125 MG IJ SOLR
INTRAMUSCULAR | Status: AC
  Administered 2014-11-04: 125 mg via INTRAVENOUS
  Filled 2014-11-04: qty 2

## 2014-11-04 MED ORDER — METOCLOPRAMIDE HCL 5 MG/ML IJ SOLN
INTRAMUSCULAR | Status: AC
  Administered 2014-11-04: 10 mg via INTRAVENOUS
  Filled 2014-11-04: qty 2

## 2014-11-04 MED ORDER — METOCLOPRAMIDE HCL 5 MG/ML IJ SOLN
10.0000 mg | Freq: Once | INTRAMUSCULAR | Status: AC
  Administered 2014-11-04: 10 mg via INTRAVENOUS

## 2014-11-04 MED ORDER — DIPHENHYDRAMINE HCL 25 MG PO CAPS: 50.0000 mg | ORAL_CAPSULE | Freq: Four times a day (QID) | ORAL | Status: DC | PRN

## 2014-11-04 MED ORDER — METHYLPREDNISOLONE SODIUM SUCC 125 MG IJ SOLR
125.0000 mg | Freq: Once | INTRAMUSCULAR | Status: AC
  Administered 2014-11-04: 125 mg via INTRAVENOUS

## 2014-11-04 MED ORDER — METOCLOPRAMIDE HCL 10 MG PO TABS: 10.0000 mg | ORAL_TABLET | Freq: Three times a day (TID) | ORAL | Status: DC

## 2014-11-04 MED ORDER — SODIUM CHLORIDE 0.9 % IV BOLUS (SEPSIS)
1000.0000 mL | Freq: Once | INTRAVENOUS | Status: AC
  Administered 2014-11-04: 1000 mL via INTRAVENOUS

## 2014-11-04 MED ORDER — KETOROLAC TROMETHAMINE 30 MG/ML IJ SOLN
30.0000 mg | Freq: Once | INTRAMUSCULAR | Status: AC
  Administered 2014-11-04: 30 mg via INTRAVENOUS

## 2014-11-04 NOTE — ED Notes (Signed)
Pulled WC Employer Profile and no UDS, Breath Analysis, or Blood Alcohol required.

## 2014-11-04 NOTE — ED Provider Notes (Signed)
[facility] Emergency Department Provider Note  ____________________________________________  Time seen: 2:30 PM  I have reviewed the triage vital signs and the nursing notes.   HISTORY  Chief Complaint Migraine and Neurologic Problem    HPI Shirley Rogers is a 49 y.o. female who complains of a migraine headache and weakness of the right side of her body that started at 1:00 PM today. 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 Her current symptoms are completely consistent with her long-standing migraine history and there is nothing new about it. She is quite confident that she's not having a stroke and that this is all related to her usual migraines. No vision changes nausea vomiting. 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 All little chemical fumes are often a trigger for her. She gets her care at the [facility] clinic and gets annual three-day suppression therapy.  Currently she complains of weakness in the right side of the face, right weakness in the right hand, paresthesia in the right forearm and hand, and mild weakness in the right leg. At symptom onset she was unable to speak and unable to walk. She now reports that she is feeling much improved after receiving oxygen nonrebreather by EMS as able speak and can walk with a slightly unsteady gait.   Past Medical History  Diagnosis Date  . Hypertension   . Barrett esophagus   . Diverticulitis   . Hemiplegic migraine     There are no active problems to display for this patient.   History reviewed. No pertinent past surgical history.  Current Outpatient Rx  Name  Route  Sig  Dispense  Refill  . diphenhydrAMINE (BENADRYL) 25 mg capsule   Oral   Take 2 capsules (50 mg total) by mouth every 6 (six) hours as  needed.   60 capsule   0   . meloxicam (MOBIC) 15 MG tablet   Oral   Take 1 tablet (15 mg total) by mouth daily.   30 tablet   2   . metoCLOPramide (REGLAN) 10 MG tablet   Oral   Take 1 tablet (10 mg total) by mouth 4 (four) times daily -  before meals and at bedtime.   60 tablet   0     Allergies Biaxin; Morphine and related; Other; and Rocephin  History reviewed. No pertinent family history.  Social History History  Substance Use Topics  . Smoking status: Current Some Day Smoker -- 4.00 packs/day for 1 years    Types: Cigarettes  . Smokeless tobacco: Not on file  . Alcohol Use: 0.0 oz/week    0 Standard drinks or equivalent per week     Comment: social    Review of Systems  Constitutional: No fever or chills. No weight changes Eyes:No blurry vision or double vision.  ENT: No sore throat. Cardiovascular: No chest pain. Respiratory: No dyspnea or cough. Gastrointestinal: Negative for abdominal pain, vomiting and diarrhea.  No BRBPR or melena. Genitourinary: Negative for dysuria, urinary retention, bloody urine, or difficulty urinating. Musculoskeletal: Negative for back pain. No joint swelling or pain. Skin: Negative for rash. Neurological: Right-sided headache and right weakness as above.Marland Kitchen Psychiatric:No anxiety or depression.   Endocrine:No hot/cold intolerance, changes in energy, or sleep difficulty.  10-point ROS otherwise negative.  ____________________________________________   PHYSICAL  EXAM:  VITAL SIGNS: ED Triage Vitals  Enc Vitals Group     BP 11/04/14 1439 146/113 mmHg     Pulse Rate 11/04/14 1439 98     Resp 11/04/14 1439 16     Temp 11/04/14 1439 98.6 F (37 C)     Temp Source 11/04/14 1439 Oral     SpO2 11/04/14 1439 100 %     Weight 11/04/14 1439 243 lb (110.224 kg)     Height 11/04/14 1439 5\' 5"  (1.651 m)     Head Cir --      Peak Flow --      Pain Score --      Pain Loc --      Pain Edu? --      Excl. in [place]? --       Constitutional: Alert and oriented. Well appearing and in no distress. Eyes: No scleral icterus. No conjunctival pallor. PERRL. EOMI ENT   Head: Normocephalic and atraumatic.   Nose: No congestion/rhinnorhea. No septal hematoma   Mouth/Throat: MMM, no pharyngeal erythema. No peritonsillar mass. No uvula shift.   Neck: No stridor. No SubQ emphysema. No meningismus. Hematological/Lymphatic/Immunilogical: No cervical lymphadenopathy. Cardiovascular: RRR. Normal and symmetric distal pulses are present in all extremities. No murmurs, rubs, or gallops. Respiratory: Normal respiratory effort without tachypnea nor retractions. Breath sounds are clear and equal bilaterally. No wheezes/rales/rhonchi. Gastrointestinal: Soft and nontender. No distention. There is no CVA tenderness.  No rebound, rigidity, or guarding. Genitourinary: deferred Musculoskeletal: Nontender with normal range of motion in all extremities. No joint effusions.  No lower extremity tenderness.  No edema. Neurologic:   Dysarthria Right eyelid weakness, right facial droop, tongue deviation to right. Right forehead is spared and is symmetric with the left..  No pronator drift. Right upper extremity is weak in the grip and bicep and tricep function. She had does have 4 out of 6 strength in these areas. The left upper external is normal. Bilateral lower external ears are normal strength. She does report paresthesia to light touch in the right forearm and hand.  Normal finger to nose testing.  Skin:  Skin is warm, dry and intact. No rash noted.  No petechiae, purpura, or bullae. Psychiatric: Mood and affect are normal. Speech and behavior are normal. Patient exhibits appropriate insight and judgment.  ____________________________________________    LABS (pertinent positives/negatives) (all labs ordered are listed, but only abnormal results are displayed) Labs Reviewed - No data to  display ____________________________________________   EKG  Interpreted by me Normal sinus rhythm rate 99, normal axis and intervals, normal QRS and ST segments and T waves.  ____________________________________________    RADIOLOGY  CT head unremarkable  ____________________________________________   PROCEDURES  ____________________________________________   INITIAL IMPRESSION / ASSESSMENT AND PLAN / ED COURSE  Pertinent labs & imaging results that were available during my care of the patient were reviewed by me and considered in my medical decision making (see chart for details).  Patient presents with a complicated migraine that is completely consistent with her long-standing migraine history for which she sees specialist at [facility] clinic. I did discuss with the patient the concern that her hypertension and neurologic deficits to making a [place] distribution stroke are concerning and advise proceeding with a CT scan of the head. She is agreeable with this. However, we'll also treat her with medications for migraine and plan to discharge her home if the CT scan is negative. Her neurologic exam is somewhat inconsistent with acute stroke and does  provide some reassurance that this is all due to, located migraine, in addition to the patient's very clear history that states that she's had symptoms like this many times before. The patient is very well versed with her medical history and is able to describe her current symptoms as a "complicated migraine with hemiplegia".  ____________________________________________   FINAL CLINICAL IMPRESSION(S) / ED DIAGNOSES  Final diagnoses:  Complicated migraine      Carrie Mew, MD 11/04/14 1625

## 2014-11-04 NOTE — ED Notes (Signed)
Pt here with neuro symptoms.  Pt has hx of hemiplegic migraines.  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  Pt had right side facial drooping, right side weakness, and became dizzy.  Pt advises that her migraines causes her symptoms.  Advises she goes to [facility] annually for treatments for same.  EMS report pt symptoms much better now than when they arrived.

## 2014-11-04 NOTE — Discharge Instructions (Signed)

## 2014-12-03 ENCOUNTER — Encounter: Payer: Self-pay | Admitting: Urgent Care

## 2014-12-03 ENCOUNTER — Emergency Department: Payer: BC Managed Care – PPO

## 2014-12-03 ENCOUNTER — Emergency Department
Admission: EM | Admit: 2014-12-03 | Discharge: 2014-12-03 | Disposition: A | Discharge status: Home / Self Care | Payer: BC Managed Care – PPO | Attending: Emergency Medicine | Admitting: Emergency Medicine

## 2014-12-03 DIAGNOSIS — Y9289 Other specified places as the place of occurrence of the external cause: Secondary | ICD-10-CM | POA: Diagnosis not present

## 2014-12-03 DIAGNOSIS — Y998 Other external cause status: Secondary | ICD-10-CM | POA: Diagnosis not present

## 2014-12-03 DIAGNOSIS — Y9389 Activity, other specified: Secondary | ICD-10-CM | POA: Diagnosis not present

## 2014-12-03 DIAGNOSIS — Z79899 Other long term (current) drug therapy: Secondary | ICD-10-CM | POA: Insufficient documentation

## 2014-12-03 DIAGNOSIS — W2209XA Striking against other stationary object, initial encounter: Secondary | ICD-10-CM | POA: Insufficient documentation

## 2014-12-03 DIAGNOSIS — I10 Essential (primary) hypertension: Secondary | ICD-10-CM | POA: Insufficient documentation

## 2014-12-03 DIAGNOSIS — Z720 Tobacco use: Secondary | ICD-10-CM | POA: Diagnosis not present

## 2014-12-03 DIAGNOSIS — S0990XA Unspecified injury of head, initial encounter: Secondary | ICD-10-CM | POA: Diagnosis present

## 2014-12-03 DIAGNOSIS — S060X0A Concussion without loss of consciousness, initial encounter: Secondary | ICD-10-CM | POA: Insufficient documentation

## 2014-12-03 DIAGNOSIS — Z791 Long term (current) use of non-steroidal anti-inflammatories (NSAID): Secondary | ICD-10-CM | POA: Insufficient documentation

## 2014-12-03 HISTORY — DX: Migraine, unspecified, not intractable, without status migrainosus: G43.909

## 2014-12-03 MED ORDER — METOCLOPRAMIDE HCL 10 MG PO TABS: 10.0000 mg | ORAL_TABLET | Freq: Four times a day (QID) | ORAL | Status: DC | PRN

## 2014-12-03 NOTE — ED Notes (Signed)
Gayle,MD consulted. MD made aware of presenting complaints and triage assessment. MD with VORB for CT head without contrast. Orders to be entered and carried by this RN.

## 2014-12-03 NOTE — Discharge Instructions (Signed)
Concussion A concussion, or closed-head injury, is a brain injury caused by a direct blow to the head or by a quick and sudden movement (jolt) of the head or neck. Concussions are usually not life-threatening. Even so, the effects of a concussion can be serious. If you have had a concussion before, you are more likely to experience concussion-like symptoms after a direct blow to the head.  CAUSES  Direct blow to the head, such as from running into another player during a soccer game, being hit in a fight, or hitting your head on a hard surface.  A jolt of the head or neck that causes the brain to move back and forth inside the skull, such as in a car crash. SIGNS AND SYMPTOMS The signs of a concussion can be hard to notice. Early on, they may be missed by you, family members, and health care providers. You may look fine but act or feel differently. Symptoms are usually temporary, but they may last for days, weeks, or even longer. Some symptoms may appear right away while others may not show up for hours or days. Every head injury is different. Symptoms include:  Mild to moderate headaches that will not go away.  A feeling of pressure inside your head.  Having more trouble than usual:  Learning or remembering things you have heard.  Answering questions.  Paying attention or concentrating.  Organizing daily tasks.  Making decisions and solving problems.  Slowness in thinking, acting or reacting, speaking, or reading.  Getting lost or being easily confused.  Feeling tired all the time or lacking energy (fatigued).  Feeling drowsy.  Sleep disturbances.  Sleeping more than usual.  Sleeping less than usual.  Trouble falling asleep.  Trouble sleeping (insomnia).  Loss of balance or feeling lightheaded or dizzy.  Nausea or vomiting.  Numbness or tingling.  Increased sensitivity to:  Sounds.  Lights.  Distractions.  Vision problems or eyes that tire  easily.  Diminished sense of taste or smell.  Ringing in the ears.  Mood changes such as feeling sad or anxious.  Becoming easily irritated or angry for little or no reason.  Lack of motivation.  Seeing or hearing things other people do not see or hear (hallucinations). DIAGNOSIS Your health care provider can usually diagnose a concussion based on a description of your injury and symptoms. He or she will ask whether you passed out (lost consciousness) and whether you are having trouble remembering events that happened right before and during your injury. Your evaluation might include:  A brain scan to look for signs of injury to the brain. Even if the test shows no injury, you may still have a concussion.  Blood tests to be sure other problems are not present. TREATMENT  Concussions are usually treated in an emergency department, in urgent care, or at a clinic. You may need to stay in the hospital overnight for further treatment.  Tell your health care provider if you are taking any medicines, including prescription medicines, over-the-counter medicines, and natural remedies. Some medicines, such as blood thinners (anticoagulants) and aspirin, may increase the chance of complications. Also tell your health care provider whether you have had alcohol or are taking illegal drugs. This information may affect treatment.  Your health care provider will send you home with important instructions to follow.  How fast you will recover from a concussion depends on many factors. These factors include how severe your concussion is, what part of your brain was injured, your  age, and how healthy you were before the concussion.  Most people with mild injuries recover fully. Recovery can take time. In general, recovery is slower in older persons. Also, persons who have had a concussion in the past or have other medical problems may find that it takes longer to recover from their current injury. HOME  CARE INSTRUCTIONS General Instructions  Carefully follow the directions your health care provider gave you.  Only take over-the-counter or prescription medicines for pain, discomfort, or fever as directed by your health care provider.  Take only those medicines that your health care provider has approved.  Do not drink alcohol until your health care provider says you are well enough to do so. Alcohol and certain other drugs may slow your recovery and can put you at risk of further injury.  If it is harder than usual to remember things, write them down.  If you are easily distracted, try to do one thing at a time. For example, do not try to watch TV while fixing dinner.  Talk with family members or close friends when making important decisions.  Keep all follow-up appointments. Repeated evaluation of your symptoms is recommended for your recovery.  Watch your symptoms and tell others to do the same. Complications sometimes occur after a concussion. Older adults with a brain injury may have a higher risk of serious complications, such as a blood clot on the brain.  Tell your teachers, school nurse, school counselor, coach, athletic trainer, or work Freight forwarder about your injury, symptoms, and restrictions. Tell them about what you can or cannot do. They should watch for:  Increased problems with attention or concentration.  Increased difficulty remembering or learning new information.  Increased time needed to complete tasks or assignments.  Increased irritability or decreased ability to cope with stress.  Increased symptoms.  Rest. Rest helps the brain to heal. Make sure you:  Get plenty of sleep at night. Avoid staying up late at night.  Keep the same bedtime hours on weekends and weekdays.  Rest during the day. Take daytime naps or rest breaks when you feel tired.  Limit activities that require a lot of thought or concentration. These include:  Doing homework or job-related  work.  Watching TV.  Working on the computer.  Avoid any situation where there is potential for another head injury (football, hockey, soccer, basketball, martial arts, downhill snow sports and horseback riding). Your condition will get worse every time you experience a concussion. You should avoid these activities until you are evaluated by the appropriate follow-up health care providers. Returning To Your Regular Activities You will need to return to your normal activities slowly, not all at once. You must give your body and brain enough time for recovery.  Do not return to sports or other athletic activities until your health care provider tells you it is safe to do so.  Ask your health care provider when you can drive, ride a bicycle, or operate heavy machinery. Your ability to react may be slower after a brain injury. Never do these activities if you are dizzy.  Ask your health care provider about when you can return to work or school. Preventing Another Concussion It is very important to avoid another brain injury, especially before you have recovered. In rare cases, another injury can lead to permanent brain damage, brain swelling, or death. The risk of this is greatest during the first 7-10 days after a head injury. Avoid injuries by:  Wearing a seat  belt when riding in a car.  Drinking alcohol only in moderation.  Wearing a helmet when biking, skiing, skateboarding, skating, or doing similar activities.  Avoiding activities that could lead to a second concussion, such as contact or recreational sports, until your health care provider says it is okay.  Taking safety measures in your home.  Remove clutter and tripping hazards from floors and stairways.  Use grab bars in bathrooms and handrails by stairs.  Place non-slip mats on floors and in bathtubs.  Improve lighting in dim areas. SEEK MEDICAL CARE IF:  You have increased problems paying attention or  concentrating.  You have increased difficulty remembering or learning new information.  You need more time to complete tasks or assignments than before.  You have increased irritability or decreased ability to cope with stress.  You have more symptoms than before. Seek medical care if you have any of the following symptoms for more than 2 weeks after your injury:  Lasting (chronic) headaches.  Dizziness or balance problems.  Nausea.  Vision problems.  Increased sensitivity to noise or light.  Depression or mood swings.  Anxiety or irritability.  Memory problems.  Difficulty concentrating or paying attention.  Sleep problems.  Feeling tired all the time. SEEK IMMEDIATE MEDICAL CARE IF:  You have severe or worsening headaches. These may be a sign of a blood clot in the brain.  You have weakness (even if only in one hand, leg, or part of the face).  You have numbness.  You have decreased coordination.  You vomit repeatedly.  You have increased sleepiness.  One pupil is larger than the other.  You have convulsions.  You have slurred speech.  You have increased confusion. This may be a sign of a blood clot in the brain.  You have increased restlessness, agitation, or irritability.  You are unable to recognize people or places.  You have neck pain.  It is difficult to wake you up.  You have unusual behavior changes.  You lose consciousness. MAKE SURE YOU:  Understand these instructions.  Will watch your condition.  Will get help right away if you are not doing well or get worse. Document Released: 08/06/2003 Document Revised: 05/21/2013 Document Reviewed: 12/06/2012 [facility] Patient Information 2015 [place], [place]. This information is not intended to replace advice given to you by your health care provider. Make sure you discuss any questions you have with your health care provider.     As we have discussed please get plenty of rest, avoid  stimulation. Please take Tylenol or Motrin as needed for any headache, Reglan as needed for nausea, as prescribed. Please follow-up your primary care doctor soon as possible.

## 2014-12-03 NOTE — ED Provider Notes (Signed)
[facility] Emergency Department Provider Note  Time seen: 9:09 PM  I have reviewed the triage vital signs and the nursing notes.   HISTORY  Chief Complaint Head Injury and Dizziness    HPI Shirley Rogers is a 49 y.o. female with a past medical history of hypertension, reflux, migraines with hemiplegic migraines presents the emergency department after a head injury. According to the patient 3 days ago she hit her head on the corner of a ping-pong table. She states since that time she has felt foggy, having trouble concentrating and off balance at times. Patient also states nausea. Denies any current headache, denies any confusion, slurred speech, focal weakness or numbness at any time. Denies fever. Denies any other injuries. Describes her symptoms as moderate.     Past Medical History  Diagnosis Date  . Hypertension   . Barrett esophagus   . Diverticulitis   . Hemiplegic migraine   . Migraine     There are no active problems to display for this patient.   Past Surgical History  Procedure Laterality Date  . Tubal ligation      Current Outpatient Rx  Name  Route  Sig  Dispense  Refill  . diphenhydrAMINE (BENADRYL) 25 mg capsule   Oral   Take 2 capsules (50 mg total) by mouth every 6 (six) hours as needed.   60 capsule   0   . meloxicam (MOBIC) 15 MG tablet   Oral   Take 1 tablet (15 mg total) by mouth daily.   30 tablet   2   . metoCLOPramide (REGLAN) 10 MG tablet   Oral   Take 1 tablet (10 mg total) by mouth 4 (four) times daily -  before meals and at bedtime.   60 tablet   0     Allergies Biaxin; Morphine and related; Other; and Rocephin  No family history on file.  Social History History  Substance Use Topics  . Smoking status: Current Some Day Smoker -- 4.00 packs/day for 1 years    Types: Cigarettes  . Smokeless tobacco: Not on file  . Alcohol Use: 0.0 oz/week    0 Standard drinks or equivalent per week      Comment: social    Review of Systems Constitutional: Negative for fever. Cardiovascular: Negative for chest pain. Respiratory: Negative for shortness of breath. Gastrointestinal: Negative for abdominal pain, positive for nausea. Musculoskeletal: Negative for back pain. Neurological: Negative for headaches, focal weakness or numbness.  10-point ROS otherwise negative.  ____________________________________________   PHYSICAL EXAM:  VITAL SIGNS: ED Triage Vitals  Enc Vitals Group     BP 12/03/14 1916 143/96 mmHg     Pulse Rate 12/03/14 1916 93     Resp 12/03/14 1916 18     Temp 12/03/14 1916 98.5 F (36.9 C)     Temp Source 12/03/14 1916 Oral     SpO2 12/03/14 1916 98 %     Weight 12/03/14 1916 238 lb (107.956 kg)     Height 12/03/14 1916 5\' 5"  (1.651 m)     Head Cir --      Peak Flow --      Pain Score 12/03/14 1917 6     Pain Loc --      Pain Edu? --      Excl. in [place]? --     Constitutional: Alert and oriented. Well appearing and in no distress. Eyes: Normal exam, 3 mm PERRL bilaterally. ENT   Head: Normocephalic.  Mild tenderness to palpation of the right temple. No hematoma or laceration noted.   Nose: No congestion/rhinnorhea.   Mouth/Throat: Mucous membranes are moist. Cardiovascular: Normal rate, regular rhythm. No murmur Respiratory: Normal respiratory effort without tachypnea nor retractions. Breath sounds are clear  Gastrointestinal: Soft and nontender. No distention.   Musculoskeletal: Nontender with normal range of motion in all extremities.  Neurologic:  Normal speech and language. No gross focal neurologic deficits are appreciated. Speech is normal. No pronator drift. 5/5 motor. Skin:  Skin is warm, dry and intact.  Psychiatric: Mood and affect are normal. Speech and behavior are normal.  ____________________________________________      RADIOLOGY  CT head shows no acute  abnormalities.  ____________________________________________   INITIAL IMPRESSION / ASSESSMENT AND PLAN / ED COURSE  Pertinent labs & imaging results that were available during my care of the patient were reviewed by me and considered in my medical decision making (see chart for details).  Patient with symptoms 3 days status post head injury. Symptoms most consistent with concussion. CT head shows no acute abnormalities. I discussed with the patient Tylenol/Motrin as needed, for any headaches. We will prescribe Reglan as needed for any nausea as the patient states this works very well for her. I discussed with the patient staying home from work, with plenty of rest and avoiding stimulation. Patient agreeable to plan we'll discharge the patient home with primary care follow-up. Patient agreeable.  ____________________________________________   FINAL CLINICAL IMPRESSION(S) / ED DIAGNOSES  concussion   Harvest Dark, MD 12/03/14 2112

## 2014-12-03 NOTE — ED Notes (Signed)
Patient presents with c/o severe dizziness, nausea, and pre-syncopal feelings since Sunday. Patient reports that she was sitting in the floor on Sunday and turned her head - hit head on a table. Patient advising that it did not knoock her out, however she "saw stars." Patient reporting that her equilibrium is off and she has been "falling to the side" today.

## 2014-12-26 ENCOUNTER — Emergency Department
Admission: EM | Admit: 2014-12-26 | Discharge: 2014-12-26 | Disposition: A | Discharge status: Home / Self Care | Payer: BC Managed Care – PPO | Attending: Emergency Medicine | Admitting: Emergency Medicine

## 2014-12-26 ENCOUNTER — Emergency Department: Payer: BC Managed Care – PPO

## 2014-12-26 DIAGNOSIS — Z720 Tobacco use: Secondary | ICD-10-CM | POA: Insufficient documentation

## 2014-12-26 DIAGNOSIS — Y9389 Activity, other specified: Secondary | ICD-10-CM | POA: Insufficient documentation

## 2014-12-26 DIAGNOSIS — Z79899 Other long term (current) drug therapy: Secondary | ICD-10-CM | POA: Diagnosis not present

## 2014-12-26 DIAGNOSIS — Y998 Other external cause status: Secondary | ICD-10-CM | POA: Diagnosis not present

## 2014-12-26 DIAGNOSIS — Y9289 Other specified places as the place of occurrence of the external cause: Secondary | ICD-10-CM | POA: Insufficient documentation

## 2014-12-26 DIAGNOSIS — S3992XA Unspecified injury of lower back, initial encounter: Secondary | ICD-10-CM | POA: Diagnosis present

## 2014-12-26 DIAGNOSIS — I10 Essential (primary) hypertension: Secondary | ICD-10-CM | POA: Diagnosis not present

## 2014-12-26 DIAGNOSIS — S161XXA Strain of muscle, fascia and tendon at neck level, initial encounter: Secondary | ICD-10-CM | POA: Insufficient documentation

## 2014-12-26 DIAGNOSIS — S0990XA Unspecified injury of head, initial encounter: Secondary | ICD-10-CM | POA: Insufficient documentation

## 2014-12-26 DIAGNOSIS — S39012A Strain of muscle, fascia and tendon of lower back, initial encounter: Secondary | ICD-10-CM | POA: Insufficient documentation

## 2014-12-26 DIAGNOSIS — T148 Other injury of unspecified body region: Secondary | ICD-10-CM | POA: Insufficient documentation

## 2014-12-26 MED ORDER — TRAMADOL HCL 50 MG PO TABS
ORAL_TABLET | ORAL | Status: AC
  Administered 2014-12-26: 50 mg via ORAL
  Filled 2014-12-26: qty 1

## 2014-12-26 MED ORDER — TRAMADOL HCL 50 MG PO TABS
50.0000 mg | ORAL_TABLET | Freq: Once | ORAL | Status: AC
  Administered 2014-12-26: 50 mg via ORAL

## 2014-12-26 MED ORDER — HYDROCODONE-ACETAMINOPHEN 5-325 MG PO TABS: 1.0000 | ORAL_TABLET | ORAL | Status: DC | PRN

## 2014-12-26 MED ORDER — DIPHENHYDRAMINE HCL 50 MG/ML IJ SOLN
50.0000 mg | Freq: Once | INTRAMUSCULAR | Status: AC
  Administered 2014-12-26: 50 mg via INTRAVENOUS
  Filled 2014-12-26: qty 1

## 2014-12-26 MED ORDER — KETOROLAC TROMETHAMINE 30 MG/ML IJ SOLN
30.0000 mg | Freq: Once | INTRAMUSCULAR | Status: AC
  Administered 2014-12-26: 30 mg via INTRAVENOUS
  Filled 2014-12-26: qty 1

## 2014-12-26 MED ORDER — METOCLOPRAMIDE HCL 5 MG/ML IJ SOLN
10.0000 mg | Freq: Once | INTRAMUSCULAR | Status: AC
  Administered 2014-12-26: 10 mg via INTRAVENOUS
  Filled 2014-12-26: qty 2

## 2014-12-26 MED ORDER — SODIUM CHLORIDE 0.9 % IV BOLUS (SEPSIS)
1000.0000 mL | Freq: Once | INTRAVENOUS | Status: AC
  Administered 2014-12-26: 1000 mL via INTRAVENOUS
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
## 2014-12-26 NOTE — ED Notes (Signed)
Patient transported to X-ray 

## 2014-12-26 NOTE — Discharge Instructions (Signed)
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
## 2014-12-26 NOTE — ED Provider Notes (Signed)
-----------------------------------------   10:26 PM on 12/26/2014 -----------------------------------------  Care was assumed from Dr. Kerman Passey at approximately 10 PM pending results of x-ray of the L-spine and pelvis which are negative for any acute traumatic pathology. Pain improved. Patient is hypertensive but asymptomatic. Pain improved. Will discharge with return precautions, close PCP follow-up. Discussed that she needs to have her blood pressure rechecked by a doctor in one week. She voices understanding of this.  Joanne Gavel, MD 12/29/14 [phone]

## 2014-12-26 NOTE — ED Notes (Signed)
Pt up to the bathroom

## 2014-12-26 NOTE — ED Notes (Signed)
Pt had diagnosis of concussion 3 weeks ago after hitting head on ping pong table

## 2015-01-06 ENCOUNTER — Emergency Department
Admission: EM | Admit: 2015-01-06 | Discharge: 2015-01-06 | Disposition: A | Discharge status: Home / Self Care | Payer: BC Managed Care – PPO | Attending: Emergency Medicine | Admitting: Emergency Medicine

## 2015-01-06 ENCOUNTER — Encounter: Payer: Self-pay | Admitting: *Deleted

## 2015-01-06 DIAGNOSIS — R51 Headache: Secondary | ICD-10-CM | POA: Insufficient documentation

## 2015-01-06 DIAGNOSIS — Z720 Tobacco use: Secondary | ICD-10-CM | POA: Diagnosis not present

## 2015-01-06 DIAGNOSIS — R42 Dizziness and giddiness: Secondary | ICD-10-CM | POA: Diagnosis present

## 2015-01-06 DIAGNOSIS — Z79899 Other long term (current) drug therapy: Secondary | ICD-10-CM | POA: Insufficient documentation

## 2015-01-06 DIAGNOSIS — I10 Essential (primary) hypertension: Secondary | ICD-10-CM | POA: Insufficient documentation

## 2015-01-06 LAB — BASIC METABOLIC PANEL
Anion gap: 8 (ref 5–15)
BUN: 17 mg/dL (ref 6–20)
CALCIUM: 8.9 mg/dL (ref 8.9–10.3)
CHLORIDE: 103 mmol/L (ref 101–111)
CO2: 29 mmol/L (ref 22–32)
CREATININE: 0.68 mg/dL (ref 0.44–1.00)
GFR calc Af Amer: >60 mL/min (ref >60)
GFR calc non Af Amer: >60 mL/min (ref >60)
Glucose, Bld: 133 mg/dL — ABNORMAL HIGH (ref 65–99)
POTASSIUM: 4 mmol/L (ref 3.5–5.1)
SODIUM: 140 mmol/L (ref 135–145)

## 2015-01-06 LAB — CBC
HEMATOCRIT: 41.9 % (ref 35.0–47.0)
HEMOGLOBIN: 13.9 g/dL (ref 12.0–16.0)
MCH: 29.2 pg (ref 26.0–34.0)
MCHC: 33.3 g/dL (ref 32.0–36.0)
MCV: 87.7 fL (ref 80.0–100.0)
Platelets: 231 10*3/uL (ref 150–440)
RBC: 4.78 MIL/uL (ref 3.80–5.20)
RDW: 13 % (ref 11.5–14.5)
WBC: 7.9 10*3/uL (ref 3.6–11.0)

## 2015-01-06 LAB — URINALYSIS COMPLETE WITH MICROSCOPIC (ARMC ONLY)
Bilirubin Urine: NEGATIVE
GLUCOSE, UA: NEGATIVE mg/dL
Hgb urine dipstick: NEGATIVE
KETONES UR: NEGATIVE mg/dL
Nitrite: NEGATIVE
PROTEIN: NEGATIVE mg/dL
Specific Gravity, Urine: 1.024 (ref 1.005–1.030)
pH: 5 (ref 5.0–8.0)

## 2015-01-06 MED ORDER — MECLIZINE HCL 25 MG PO TABS: 25.0000 mg | ORAL_TABLET | Freq: Three times a day (TID) | ORAL | Status: DC | PRN

## 2015-01-06 MED ORDER — MECLIZINE HCL 25 MG PO TABS
25.0000 mg | ORAL_TABLET | Freq: Once | ORAL | Status: AC
  Administered 2015-01-06: 25 mg via ORAL
  Filled 2015-01-06: qty 1

## 2015-01-06 NOTE — ED Provider Notes (Signed)
[facility] Emergency Department Provider Note  Time seen: 9:20 PM  I have reviewed the triage vital signs and the nursing notes.   HISTORY  Chief Complaint Dizziness    HPI Shirley Rogers is a 49 y.o. female who presents the emergency department with dizziness. Patient states several months of dizziness, continues to affect her. Patient does not have a local PCP, has not seen neurology or ENT. Patient has been seen several times for the same, with normal CT scans in the past per the patient denies any headache today, but states she often has a headache. Denies any focal weakness or numbness. Patient states the dizziness is largely positional. States she becomes dizzy she turns her head quickly or stands up quickly. Also states she becomes dizzy if she watches movement on TV screens are computer screens. Patient has not been prescribed or taken any over-the-counter medications for the symptoms.     Past Medical History  Diagnosis Date  . Hypertension   . Barrett esophagus   . Diverticulitis   . Hemiplegic migraine   . Migraine     There are no active problems to display for this patient.   Past Surgical History  Procedure Laterality Date  . Tubal ligation      Current Outpatient Rx  Name  Route  Sig  Dispense  Refill  . diphenhydrAMINE (BENADRYL) 25 mg capsule   Oral   Take 2 capsules (50 mg total) by mouth every 6 (six) hours as needed. Patient taking differently: Take 50 mg by mouth every 6 (six) hours as needed (for headaches).    60 capsule   0   . HYDROcodone-acetaminophen (NORCO/VICODIN) 5-325 MG per tablet   Oral   Take 1 tablet by mouth every 4 (four) hours as needed for moderate pain.   12 tablet   0   . magnesium oxide (MAG-OX) 400 MG tablet   Oral   Take 1,200 mg by mouth daily.         . meloxicam (MOBIC) 15 MG tablet   Oral   Take 1 tablet (15 mg total) by mouth daily. Patient taking differently: Take 15 mg by mouth  daily as needed for pain.    30 tablet   2   . metoCLOPramide (REGLAN) 10 MG tablet   Oral   Take 1 tablet (10 mg total) by mouth every 6 (six) hours as needed for nausea.   20 tablet   0   . naproxen sodium (ANAPROX) 220 MG tablet   Oral   Take 660 mg by mouth 2 (two) times daily as needed (for pain/headaches).           Allergies Rocephin; Biaxin; Morphine and related; and Other  No family history on file.  Social History History  Substance Use Topics  . Smoking status: Current Some Day Smoker -- 4.00 packs/day for 1 years    Types: Cigarettes  . Smokeless tobacco: Not on file  . Alcohol Use: 0.0 oz/week    0 Standard drinks or equivalent per week     Comment: social    Review of Systems Constitutional: Negative for fever. Cardiovascular: Negative for chest pain. Respiratory: Negative for shortness of breath. Gastrointestinal: Negative for abdominal pain Genitourinary: Negative for dysuria. Neurological: Positive for intermittent headache. None currently. Positive for dizziness. No focal weakness or numbness. 10-point ROS otherwise negative.  ____________________________________________   PHYSICAL EXAM:  VITAL SIGNS: ED Triage Vitals  Enc Vitals Group  BP 01/06/15 1802 174/107 mmHg     Pulse Rate 01/06/15 1802 90     Resp 01/06/15 1802 20     Temp 01/06/15 1802 99 F (37.2 C)     Temp Source 01/06/15 1802 Oral     SpO2 01/06/15 1802 100 %     Weight 01/06/15 1802 235 lb (106.595 kg)     Height 01/06/15 1802 5\' 5"  (1.651 m)     Head Cir --      Peak Flow --      Pain Score 01/06/15 1805 0     Pain Loc --      Pain Edu? --      Excl. in [place]? --     Constitutional: Alert and oriented. Well appearing and in no distress. Eyes: Normal exam, 2 mm PERRL ENT   Head: Normocephalic and atraumatic.   Mouth/Throat: Mucous membranes are moist. Cardiovascular: Normal rate, regular rhythm. No murmur Respiratory: Normal respiratory effort without  tachypnea nor retractions. Breath sounds are clear and equal bilaterally. No wheezes/rales/rhonchi. Gastrointestinal: Soft and nontender. No distention.  Musculoskeletal: Nontender with normal range of motion in all extremities.  Neurologic:  Normal speech and language. No gross focal neurologic deficits are appreciated. Speech is normal. Normal finger to nose, equal grip strengths. Skin:  Skin is warm, dry and intact.  Psychiatric: Mood and affect are normal. Speech and behavior are normal.   ____________________________________________    EKG  EKG reviewed and interpreted by mouth shows normal sinus rhythm at 92 bpm, narrow QRS, normal axis, normal intervals, no ST changes noted. Overall normal EKG.  ____________________________________________   INITIAL IMPRESSION / ASSESSMENT AND PLAN / ED COURSE  Pertinent labs & imaging results that were available during my care of the patient were reviewed by me and considered in my medical decision making (see chart for details).  Patient with symptoms most suggestive of vertigo. Patient has not taken any medications. We will treat with meclizine in the emergency department, and monitor for improvement. Patient has had multiple CT scans for brain including 12/26/14. None of which have shown any acute abnormalities that would cause her symptoms. Labs today are within normal limits. If patient presented meclizine we will discharge with ENT follow-up.  Patient states some improvement with meclizine. We'll discharge home with ENT follow-up. Patient agreeable to plan.  ____________________________________________   FINAL CLINICAL IMPRESSION(S) / ED DIAGNOSES  Vertigo   Harvest Dark, MD 01/06/15 2234
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
## 2015-01-06 NOTE — Discharge Instructions (Signed)

## 2015-03-12 ENCOUNTER — Other Ambulatory Visit: Payer: Self-pay | Admitting: Otolaryngology

## 2015-03-12 DIAGNOSIS — R42 Dizziness and giddiness: Secondary | ICD-10-CM

## 2015-03-13 ENCOUNTER — Encounter: Payer: Self-pay | Admitting: Family Medicine

## 2015-03-13 ENCOUNTER — Telehealth: Payer: Self-pay | Admitting: Family Medicine

## 2015-03-13 ENCOUNTER — Ambulatory Visit (INDEPENDENT_AMBULATORY_CARE_PROVIDER_SITE_OTHER): Payer: BC Managed Care – PPO | Admitting: Family Medicine

## 2015-03-13 ENCOUNTER — Ambulatory Visit
Admission: RE | Admit: 2015-03-13 | Discharge: 2015-03-13 | Disposition: A | Discharge status: Home / Self Care | Payer: BC Managed Care – PPO | Source: Ambulatory Visit | Attending: Family Medicine | Admitting: Family Medicine

## 2015-03-13 VITALS — BP 135/91 | HR 91 | Temp 98.8°F | Ht 64.2 in | Wt 241.0 lb

## 2015-03-13 DIAGNOSIS — I10 Essential (primary) hypertension: Secondary | ICD-10-CM

## 2015-03-13 DIAGNOSIS — F329 Major depressive disorder, single episode, unspecified: Secondary | ICD-10-CM

## 2015-03-13 DIAGNOSIS — F0781 Postconcussional syndrome: Secondary | ICD-10-CM

## 2015-03-13 DIAGNOSIS — R0989 Other specified symptoms and signs involving the circulatory and respiratory systems: Secondary | ICD-10-CM | POA: Insufficient documentation

## 2015-03-13 DIAGNOSIS — Z1239 Encounter for other screening for malignant neoplasm of breast: Secondary | ICD-10-CM

## 2015-03-13 DIAGNOSIS — I129 Hypertensive chronic kidney disease with stage 1 through stage 4 chronic kidney disease, or unspecified chronic kidney disease: Secondary | ICD-10-CM | POA: Insufficient documentation

## 2015-03-13 DIAGNOSIS — Z1211 Encounter for screening for malignant neoplasm of colon: Secondary | ICD-10-CM

## 2015-03-13 DIAGNOSIS — K5790 Diverticulosis of intestine, part unspecified, without perforation or abscess without bleeding: Secondary | ICD-10-CM | POA: Insufficient documentation

## 2015-03-13 DIAGNOSIS — Z8673 Personal history of transient ischemic attack (TIA), and cerebral infarction without residual deficits: Secondary | ICD-10-CM | POA: Insufficient documentation

## 2015-03-13 DIAGNOSIS — K2270 Barrett's esophagus without dysplasia: Secondary | ICD-10-CM | POA: Insufficient documentation

## 2015-03-13 DIAGNOSIS — G43409 Hemiplegic migraine, not intractable, without status migrainosus: Secondary | ICD-10-CM | POA: Insufficient documentation

## 2015-03-13 DIAGNOSIS — M329 Systemic lupus erythematosus, unspecified: Secondary | ICD-10-CM | POA: Insufficient documentation

## 2015-03-13 DIAGNOSIS — F32A Depression, unspecified: Secondary | ICD-10-CM

## 2015-03-13 MED ORDER — CITALOPRAM HYDROBROMIDE 20 MG PO TABS: 10.0000 mg | ORAL_TABLET | Freq: Every day | ORAL | Status: DC

## 2015-03-13 NOTE — Assessment & Plan Note (Signed)
Not under good control. Likely due to her concussion. Wants medication. Will start celexa. Follow up in 2-3 weeks. Continue to monitor.

## 2015-03-13 NOTE — Assessment & Plan Note (Signed)
Will call with what meds she was on previously. Will start lower dose of medicine if on higher dose previously on a higher dose. Recheck next visit.
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
## 2015-03-13 NOTE — Telephone Encounter (Signed)
Left voicemail for patient letting her know the results were normal.

## 2015-03-13 NOTE — Assessment & Plan Note (Signed)
Doing poorly. Saw ENT on Wednesday. To see neurology on Monday and to have MRI. Await results. Continue to monitor closely. Continue brain rest.

## 2015-03-13 NOTE — Telephone Encounter (Signed)
Please let her know that her x-ray came back normal. Keep taking the proair and we'll check a breathing test next visit.

## 2015-03-16 ENCOUNTER — Telehealth: Payer: Self-pay

## 2015-03-16 ENCOUNTER — Other Ambulatory Visit: Payer: Self-pay

## 2015-03-16 NOTE — Telephone Encounter (Signed)
Gastroenterology Pre-Procedure Review  Request Date: 03/26/15 Requesting Physician: Alvin Critchley, DO  PATIENT REVIEW QUESTIONS: The patient responded to the following health history questions as indicated:    1. Are you having any GI issues? yes (Constipation x couple of weeks then diarrhea) 2. Do you have a personal history of Polyps? yes (2011 removed polys, diverticulosis, bloating) 3. Do you have a family history of Colon Cancer or Polyps? no 4. Diabetes Mellitus? no 5. Joint replacements in the past 12 months?no 6. Major health problems in the past 3 months?no 7. Any artificial heart valves, MVP, or defibrillator?no    MEDICATIONS & ALLERGIES:    Patient reports the following regarding taking any anticoagulation/antiplatelet therapy:   Plavix, Coumadin, Eliquis, Xarelto, Lovenox, Pradaxa, Brilinta, or Effient? no Aspirin? no  Patient confirms/reports the following medications:  Current Outpatient Prescriptions  Medication Sig Dispense Refill  . benzonatate (TESSALON) 100 MG capsule TK 1 C PO TID PRN COU  0  . citalopram (CELEXA) 20 MG tablet Take 0.5 tablets (10 mg total) by mouth daily. For 1 week, then increase to 1 tablet daily 30 tablet 1  . diphenhydrAMINE (BENADRYL) 25 mg capsule Take 2 capsules (50 mg total) by mouth every 6 (six) hours as needed. (Patient taking differently: Take 50 mg by mouth every 6 (six) hours as needed (for headaches). ) 60 capsule 0  . magnesium oxide (MAG-OX) 400 MG tablet Take 1,200 mg by mouth daily.    . metoCLOPramide (REGLAN) 10 MG tablet Take 1 tablet (10 mg total) by mouth every 6 (six) hours as needed for nausea. 20 tablet 0  . naproxen sodium (ANAPROX) 220 MG tablet Take 660 mg by mouth 2 (two) times daily as needed (for pain/headaches).    Marland Kitchen PROAIR HFA 108 (90 BASE) MCG/ACT inhaler INHALE 2 PUFFS PO Q 4 H PRF WHEEZING  0   Current Facility-Administered Medications  Medication Dose Route Frequency Provider Last Rate Last Dose  .  triamcinolone acetonide (KENALOG) 10 MG/ML injection 10 mg  10 mg Other Once Trula Slade, DPM        Patient confirms/reports the following allergies:  Allergies  Allergen Reactions  . Rocephin [Ceftriaxone] Anaphylaxis  . Biaxin [Clarithromycin] Itching and Nausea And Vomiting  . Morphine And Related Nausea And Vomiting  . Other Itching, Nausea And Vomiting and Other (See Comments)    Pt states that she is allergic to all mycin drugs.      No orders of the defined types were placed in this encounter.    AUTHORIZATION INFORMATION Primary Insurance: 1D#: Group #:  Secondary Insurance: 1D#: Group #:  SCHEDULE INFORMATION: Date: 03/26/15 Time: Location: [place]

## 2015-03-20 ENCOUNTER — Ambulatory Visit
Admission: RE | Admit: 2015-03-20 | Discharge: 2015-03-20 | Disposition: A | Discharge status: Home / Self Care | Payer: BC Managed Care – PPO | Source: Ambulatory Visit | Attending: Otolaryngology | Admitting: Otolaryngology

## 2015-03-20 DIAGNOSIS — R42 Dizziness and giddiness: Secondary | ICD-10-CM | POA: Insufficient documentation

## 2015-03-20 DIAGNOSIS — G930 Cerebral cysts: Secondary | ICD-10-CM | POA: Diagnosis not present

## 2015-03-20 DIAGNOSIS — S0990XS Unspecified injury of head, sequela: Secondary | ICD-10-CM | POA: Insufficient documentation

## 2015-03-20 MED ORDER — GADOBENATE DIMEGLUMINE 529 MG/ML IV SOLN
20.0000 mL | Freq: Once | INTRAVENOUS | Status: AC | PRN
  Administered 2015-03-20: 20 mL via INTRAVENOUS

## 2015-03-23 ENCOUNTER — Encounter: Payer: Self-pay | Admitting: *Deleted

## 2015-03-23 ENCOUNTER — Ambulatory Visit (INDEPENDENT_AMBULATORY_CARE_PROVIDER_SITE_OTHER): Payer: BC Managed Care – PPO | Admitting: Diagnostic Neuroimaging

## 2015-03-23 ENCOUNTER — Encounter: Payer: Self-pay | Admitting: Anesthesiology

## 2015-03-23 ENCOUNTER — Encounter: Payer: Self-pay | Admitting: Diagnostic Neuroimaging

## 2015-03-23 VITALS — Ht 64.0 in | Wt 245.8 lb

## 2015-03-23 DIAGNOSIS — G44329 Chronic post-traumatic headache, not intractable: Secondary | ICD-10-CM | POA: Diagnosis not present

## 2015-03-23 DIAGNOSIS — F0781 Postconcussional syndrome: Secondary | ICD-10-CM

## 2015-03-23 NOTE — Patient Instructions (Signed)
Thank you for coming to see [place] at [facility]. I hope we have been able to provide you high quality care today.  You may receive a patient satisfaction survey over the next few weeks. We would appreciate your feedback and comments so that we may continue to improve ourselves and the health of our patients.  - gradually increase activities - try physical therapy sessions   ~~~~~~~~~~~~~~~~~~~~~~~~~~~~~~~~~~~~~~~~~~~~~~~~~~~~~~~~~~~~~~~~~  DR. PENUMALLI'S GUIDE TO HAPPY AND HEALTHY LIVING These are some of my general health and wellness recommendations. Some of them may apply to you better than others. Please use common sense as you try these suggestions and feel free to ask me any questions.   ACTIVITY/FITNESS Mental, social, emotional and physical stimulation are very important for brain and body health. Try learning a new activity (arts, music, language, sports, games).  Keep moving your body to the best of your abilities. You can do this at home, inside or outside, the park, community center, gym or anywhere you like. Consider a physical therapist or personal trainer to get started. Consider the app Sworkit. Fitness trackers such as smart-watches, smart-phones or Fitbits can help as well.   NUTRITION Eat more plants: colorful vegetables, nuts, seeds and berries.  Eat less sugar, salt, preservatives and processed foods.  Avoid toxins such as cigarettes and alcohol.  Drink water when you are thirsty. Warm water with a slice of lemon is an excellent morning drink to start the day.  Consider these websites for more information The Nutrition Source (https://www.henry-hernandez.biz/) [employer] Nutrition (WindowBlog.ch)   RELAXATION Consider practicing mindfulness meditation or other relaxation techniques such as deep breathing, prayer, yoga, tai chi, massage. See website mindful.org or the apps Headspace or Calm to help get  started.   SLEEP Try to get at least 7-8+ hours sleep per day. Regular exercise and reduced caffeine will help you sleep better. Practice good sleep hygeine techniques. See website sleep.org for more information.   PLANNING Prepare estate planning, living will, healthcare POA documents. Sometimes this is best planned with the help of an attorney. Theconversationproject.org and agingwithdignity.org are excellent resources.
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
## 2015-03-24 ENCOUNTER — Telehealth: Payer: Self-pay | Admitting: Family Medicine

## 2015-03-24 NOTE — Telephone Encounter (Signed)
Dr.johnson, I know that this patient has had concussions in the past, did you discuss headaches at her visit? She is requesting the following.

## 2015-03-24 NOTE — Telephone Encounter (Signed)
Pt is having headaches and would like to have something called in to [facility]

## 2015-03-24 NOTE — Discharge Instructions (Signed)

## 2015-03-24 NOTE — Telephone Encounter (Signed)
I did not talk about any medication for her headache with her. Her neurologist was considering a migraine cocktail with her per his note from yesterday- I would advise that she call him, otherwise she will need an appointment.

## 2015-03-25 ENCOUNTER — Telehealth: Payer: Self-pay | Admitting: Gastroenterology

## 2015-03-25 NOTE — Telephone Encounter (Signed)
Left a voicemail to notify patient of Dr.Johnson's response.

## 2015-03-25 NOTE — Telephone Encounter (Signed)
Patient called to cancel colonoscopy due to medical issues. She will call back to reschedule when she gets clearance from her PCP. [facility] was notified.

## 2015-03-26 ENCOUNTER — Ambulatory Visit
Admission: RE | Admit: 2015-03-26 | Payer: BC Managed Care – PPO | Source: Ambulatory Visit | Admitting: Gastroenterology

## 2015-03-26 HISTORY — DX: Dizziness and giddiness: R42

## 2015-03-26 HISTORY — DX: Gastro-esophageal reflux disease without esophagitis: K21.9

## 2015-03-26 HISTORY — DX: Family history of other specified conditions: Z84.89

## 2015-03-26 HISTORY — DX: Sleep apnea, unspecified: G47.30

## 2015-03-26 SURGERY — COLONOSCOPY WITH PROPOFOL
Anesthesia: Choice

## 2015-03-30 ENCOUNTER — Encounter: Payer: Self-pay | Admitting: Family Medicine

## 2015-03-30 ENCOUNTER — Telehealth: Payer: Self-pay | Admitting: *Deleted

## 2015-03-30 ENCOUNTER — Ambulatory Visit (INDEPENDENT_AMBULATORY_CARE_PROVIDER_SITE_OTHER): Payer: BC Managed Care – PPO | Admitting: Family Medicine

## 2015-03-30 ENCOUNTER — Other Ambulatory Visit: Payer: Self-pay | Admitting: Family Medicine

## 2015-03-30 VITALS — BP 157/112 | HR 92 | Temp 99.3°F | Ht 64.0 in | Wt 245.0 lb

## 2015-03-30 DIAGNOSIS — F32A Depression, unspecified: Secondary | ICD-10-CM

## 2015-03-30 DIAGNOSIS — IMO0002 Reserved for concepts with insufficient information to code with codable children: Secondary | ICD-10-CM

## 2015-03-30 DIAGNOSIS — F458 Other somatoform disorders: Secondary | ICD-10-CM

## 2015-03-30 DIAGNOSIS — R0602 Shortness of breath: Secondary | ICD-10-CM

## 2015-03-30 DIAGNOSIS — R09A2 Foreign body sensation, throat: Secondary | ICD-10-CM

## 2015-03-30 DIAGNOSIS — I10 Essential (primary) hypertension: Secondary | ICD-10-CM | POA: Diagnosis not present

## 2015-03-30 DIAGNOSIS — M329 Systemic lupus erythematosus, unspecified: Secondary | ICD-10-CM

## 2015-03-30 DIAGNOSIS — R0989 Other specified symptoms and signs involving the circulatory and respiratory systems: Secondary | ICD-10-CM | POA: Insufficient documentation

## 2015-03-30 DIAGNOSIS — F329 Major depressive disorder, single episode, unspecified: Secondary | ICD-10-CM | POA: Diagnosis not present

## 2015-03-30 DIAGNOSIS — F0781 Postconcussional syndrome: Secondary | ICD-10-CM | POA: Diagnosis not present

## 2015-03-30 MED ORDER — TRAZODONE HCL 50 MG PO TABS: 25.0000 mg | ORAL_TABLET | Freq: Every evening | ORAL | Status: DC | PRN

## 2015-03-30 MED ORDER — TRAMADOL HCL 50 MG PO TABS: 50.0000 mg | ORAL_TABLET | Freq: Three times a day (TID) | ORAL | Status: DC | PRN

## 2015-03-30 NOTE — Telephone Encounter (Signed)
Patient calling requesting migraine cocktail this week. She states she spoke with Otila Kluver, infusion RN who stated Dr Leta Baptist will need to place orders, and she can schedule patient. Patient states she is not requesting this today, but does want to receive it this week due to HA, migraine not improving. Informed patient would route her request to Dr Colen Darling and have Otila Kluver call her back to schedule. Patient verbalized understanding, agreement to plan.

## 2015-03-30 NOTE — Assessment & Plan Note (Signed)
Not under good control today, but in severe pain due to migraine. Recheck next visit.

## 2015-03-30 NOTE — Progress Notes (Signed)
BP 157/112 mmHg  Pulse 92  Temp(Src) 99.3 F (37.4 C)  Ht 5\' 4"  (1.626 m)  Wt 245 lb (111.131 kg)  BMI 42.03 kg/m2  SpO2 98%   Subjective:    Patient ID: Shirley Rogers, female    DOB: [birth year], 49 y.o.   MRN: [id number]  HPI: Shirley Rogers is a 49 y.o. female  Chief Complaint  Patient presents with  . Depression  . Shortness of Breath    Follow up on bronchitis  . Headache  . Insomnia   DEPRESSION- has been taking it 2 weeks now.  Mood status: uncontrolled Satisfied with current treatment?: no Symptom severity: moderate  Duration of current treatment : yes Side effects: yes, belly pain and nausea, fatigue Medication compliance: excellent compliance Psychotherapy/counseling: no  Previous psychiatric medications: none Depressed mood: yes Anxious mood: yes Anhedonia: no Significant weight loss or gain: no Insomnia: yes hard to fall asleep and hard to stay asleep Fatigue: yes Feelings of worthlessness or guilt: yes Impaired concentration/indecisiveness: yes Suicidal ideations: no Hopelessness: yes Crying spells: yes Depression screen PHQ 2/9 03/30/2015  Decreased Interest 1  Down, Depressed, Hopeless 2  PHQ - 2 Score 3  Altered sleeping 3  Tired, decreased energy 2  Change in appetite 2  Feeling bad or failure about yourself  1  Trouble concentrating 2  Moving slowly or fidgety/restless 1  Suicidal thoughts 0  PHQ-9 Score 14  Difficult doing work/chores Very difficult    SHORTNESS OF BREATH- still feeling tight in her chest, constantly clearing her throat Duration:  X 1-2 months since having bronchitis. Feels like a tightness in her throat. Had a nodule on ultrasound of her thyroid, quite concerned about that Onset: sudden Description of breathing discomfort: tightness in her throat Severity: moderate Episode duration: nearly constant Frequency: daily Related to exertion: yes Cough: yes non-productive Chest tightness: yes Wheezing:  no Fevers: no Chest pain: no Palpitations: no  Nausea: no Diaphoresis: no Deconditioning: yes Status: stable   INSOMNIA Duration: 3 months Satisfied with sleep quality: no Difficulty falling asleep: yes Difficulty staying asleep: yes Waking a few hours after sleep onset: yes Early morning awakenings: yes Daytime hypersomnolence: yes Wakes feeling refreshed: no Good sleep hygiene: yes Apnea: no Snoring: no Depressed/anxious mood: yes Recent stress: yes  Restless legs/nocturnal leg cramps: no Chronic pain/arthritis: no History of sleep study: no Treatments attempted: melatonin and benadryl   To be seeing her neurologist for a migraine cocktail this week.   Relevant past medical, surgical, family and social history reviewed and updated as indicated. Interim medical history since our last visit reviewed. Allergies and medications reviewed and updated.  Review of Systems  Respiratory: Negative.   Cardiovascular: Negative.   Musculoskeletal: Negative.   Neurological: Positive for dizziness, weakness, light-headedness, numbness and headaches. Negative for tremors, seizures, syncope, facial asymmetry and speech difficulty.  Psychiatric/Behavioral: Negative.     Per HPI unless specifically indicated above     Objective:    BP 157/112 mmHg  Pulse 92  Temp(Src) 99.3 F (37.4 C)  Ht 5\' 4"  (1.626 m)  Wt 245 lb (111.131 kg)  BMI 42.03 kg/m2  SpO2 98%  Wt Readings from Last 3 Encounters:  03/30/15 245 lb (111.131 kg)  03/23/15 245 lb 12.8 oz (111.494 kg)  03/23/15 241 lb (109.317 kg)    Physical Exam  Constitutional: She is oriented to person, place, and time. She appears well-developed and well-nourished. No distress.  HENT:  Head: Normocephalic and atraumatic.  Right  Ear: Hearing normal.  Left Ear: Hearing normal.  Nose: Nose normal.  Eyes: Conjunctivae and lids are normal. Right eye exhibits no discharge. Left eye exhibits no discharge. No scleral icterus.   Neck: Normal range of motion. Neck supple. No JVD present. No tracheal deviation present. No thyromegaly present.  Cardiovascular: Normal rate, regular rhythm, normal heart sounds and intact distal pulses.  Exam reveals no gallop and no friction rub.   No murmur heard. Pulmonary/Chest: Effort normal and breath sounds normal. No stridor. No respiratory distress. She has no wheezes. She has no rales. She exhibits no tenderness.  Musculoskeletal: Normal range of motion.  Lymphadenopathy:    She has no cervical adenopathy.  Neurological: She is alert and oriented to person, place, and time.  Skin: Skin is warm, dry and intact. No rash noted. No erythema. No pallor.  Psychiatric: She has a normal mood and affect. Her speech is normal and behavior is normal. Judgment and thought content normal. Cognition and memory are normal.  Nursing note and vitals reviewed.   Results for orders placed or performed in visit on 03/13/15  HM COLONOSCOPY  Result Value Ref Range   HM Colonoscopy Needs another one done       Assessment & Plan:   Problem List Items Addressed This Visit      Cardiovascular and Mediastinum   Hypertension    Not under good control today, but in severe pain due to migraine. Recheck next visit.       Relevant Orders   Comprehensive metabolic panel   TSH   T3   T4     Nervous and Auditory   Post-concussion syndrome    Continue to follow with neurology. Continue brain rest. Tramadol given today for severe migraines.       Relevant Medications   traZODone (DESYREL) 50 MG tablet   Other Relevant Orders   Comprehensive metabolic panel     Other   Lupus ([place])    Will recheck ANA today. Records from previous doctor just arrived, have not been able to be reviewed yet.       Relevant Orders   CBC with Differential/Platelet   Antinuclear Antib (ANA)   Depression - Primary    2 weeks in on her celexa. Not noticing a difference. Will check in in 2-3 weeks to see how she  is doing and up the dose if needed. Rx for trazodone for severe insomnia given today. Await results of CBC and Thyroid panel checked today.       Relevant Medications   traZODone (DESYREL) 50 MG tablet   Other Relevant Orders   CBC with Differential/Platelet   Comprehensive metabolic panel   TSH   T3   T4   Globus sensation    Will check soft tissue ultrasound. Possibly due to stress. Await results.       Relevant Orders   US Soft Tissue Head/Neck    Other Visit Diagnoses    SOB (shortness of breath)        Possibly due to globus sensation. Lungs clear. Checking labs. Spiro normal today.        Follow up plan: Return 2-3 weeks, for follow up depression.

## 2015-03-30 NOTE — Assessment & Plan Note (Signed)
Will check soft tissue ultrasound. Possibly due to stress. Await results.

## 2015-03-30 NOTE — Assessment & Plan Note (Addendum)
2 weeks in on her celexa. Not noticing a difference. Will check in in 2-3 weeks to see how she is doing and up the dose if needed. Rx for trazodone for severe insomnia given today. Await results of CBC and Thyroid panel checked today.

## 2015-03-30 NOTE — Assessment & Plan Note (Signed)
Continue to follow with neurology. Continue brain rest. Tramadol given today for severe migraines.

## 2015-03-30 NOTE — Assessment & Plan Note (Signed)
Will recheck ANA today. Records from previous doctor just arrived, have not been able to be reviewed yet.

## 2015-03-31 ENCOUNTER — Encounter: Payer: Self-pay | Admitting: Family Medicine

## 2015-03-31 ENCOUNTER — Telehealth: Payer: Self-pay | Admitting: Family Medicine

## 2015-03-31 DIAGNOSIS — M3219 Other organ or system involvement in systemic lupus erythematosus: Secondary | ICD-10-CM

## 2015-03-31 DIAGNOSIS — M329 Systemic lupus erythematosus, unspecified: Principal | ICD-10-CM

## 2015-03-31 LAB — CBC WITH DIFFERENTIAL/PLATELET
BASOS ABS: 0 10*3/uL (ref 0.0–0.2)
BASOS: 0 %
EOS (ABSOLUTE): 0.1 10*3/uL (ref 0.0–0.4)
Eos: 2 %
HEMOGLOBIN: 14.4 g/dL (ref 11.1–15.9)
Hematocrit: 41.9 % (ref 34.0–46.6)
IMMATURE GRANS (ABS): 0 10*3/uL (ref 0.0–0.1)
Immature Granulocytes: 0 %
LYMPHS ABS: 2.2 10*3/uL (ref 0.7–3.1)
LYMPHS: 28 %
MCH: 30.1 pg (ref 26.6–33.0)
MCHC: 34.4 g/dL (ref 31.5–35.7)
MCV: 88 fL (ref 79–97)
MONOCYTES: 7 %
Monocytes Absolute: 0.6 10*3/uL (ref 0.1–0.9)
NEUTROS ABS: 4.8 10*3/uL (ref 1.4–7.0)
Neutrophils: 63 %
Platelets: 286 10*3/uL (ref 150–379)
RBC: 4.79 x10E6/uL (ref 3.77–5.28)
RDW: 13.8 % (ref 12.3–15.4)
WBC: 7.6 10*3/uL (ref 3.4–10.8)

## 2015-03-31 LAB — COMPREHENSIVE METABOLIC PANEL
A/G RATIO: 2.1 (ref 1.1–2.5)
ALBUMIN: 4.4 g/dL (ref 3.5–5.5)
ALT: 15 IU/L (ref 0–32)
AST: 19 IU/L (ref 0–40)
Alkaline Phosphatase: 65 IU/L (ref 39–117)
BILIRUBIN TOTAL: 0.2 mg/dL (ref 0.0–1.2)
BUN/Creatinine Ratio: 27 — ABNORMAL HIGH (ref 9–23)
BUN: 16 mg/dL (ref 6–24)
CHLORIDE: 102 mmol/L (ref 97–106)
CO2: 27 mmol/L (ref 18–29)
CREATININE: 0.6 mg/dL (ref 0.57–1.00)
Calcium: 9.4 mg/dL (ref 8.7–10.2)
GFR calc non Af Amer: 106 mL/min/{1.73_m2} (ref >59)
GFR, EST AFRICAN AMERICAN: 122 mL/min/{1.73_m2} (ref >59)
GLOBULIN, TOTAL: 2.1 g/dL (ref 1.5–4.5)
GLUCOSE: 106 mg/dL — AB (ref 65–99)
Potassium: 4.8 mmol/L (ref 3.5–5.2)
Sodium: 144 mmol/L (ref 136–144)
TOTAL PROTEIN: 6.5 g/dL (ref 6.0–8.5)

## 2015-03-31 LAB — ANA: ANA: NEGATIVE

## 2015-03-31 LAB — TSH: TSH: 0.619 u[IU]/mL (ref 0.450–4.500)

## 2015-03-31 LAB — T4: T4 TOTAL: 8.6 ug/dL (ref 4.5–12.0)

## 2015-03-31 LAB — T3: T3, Total: 110 ng/dL (ref 71–180)

## 2015-03-31 NOTE — Telephone Encounter (Addendum)
Needs repeat carotid ultrasound- mild atherosclerosis in 2003 per old records Likely needs referral to rheumatology regarding lupus/ms/behcet's- 1 ANA positive with 1:160, but concern for vasculitis on imaging, other ANAs have been negative.  MRIs and labs to be scanned into the chart.

## 2015-04-01 NOTE — Telephone Encounter (Signed)
Pt returned called after you were gone and i didn't feel comfortable with discussing what was said in the previous messages so she would like a call back.

## 2015-04-01 NOTE — Telephone Encounter (Signed)
Called to give [facility] her results. LMOM for her to call back. Everything looked normal including thyroid panel. ANA also came back normal. 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 May be worth it to see a rheumatologist to see what they think and also to put her mind a little at ease. OK to give her this message if she calls back. Let me know if she wants to see rheum and I'll put the order in for her. It also looks like she is due for a repeat carotid ultrasound. If she would like to do this before the end of the year, let me know and I'll order it, otherwise we'll talk about it at her follow up.

## 2015-04-02 ENCOUNTER — Telehealth: Payer: Self-pay | Admitting: *Deleted

## 2015-04-02 NOTE — Telephone Encounter (Signed)
Can you call Shirley Rogers and [employer]. I'll be happy to talk to her if she needs to talk to me

## 2015-04-02 NOTE — Telephone Encounter (Signed)
Left vm advising pt this RN was calling to check on her status after receiving migraine infusion yesterday.requested she call back for any needs, concerns. Informed her this RN is off tomorrow but Dr Leta Baptist is in office. Left office number.

## 2015-04-02 NOTE — Telephone Encounter (Signed)
Tried to call patient, no answer left a voicemail for patient to return my call.

## 2015-04-03 ENCOUNTER — Ambulatory Visit: Payer: BC Managed Care – PPO | Attending: Otolaryngology

## 2015-04-03 DIAGNOSIS — R269 Unspecified abnormalities of gait and mobility: Secondary | ICD-10-CM | POA: Diagnosis present

## 2015-04-03 DIAGNOSIS — R42 Dizziness and giddiness: Secondary | ICD-10-CM | POA: Diagnosis not present

## 2015-04-03 NOTE — Patient Instructions (Signed)
Feet Together, Head Motion - Eyes Open    With eyes open, feet together, move head slowly: left and right. Perform for 30 seconds Repeat _3___ times per session. Do _4__ sessions per day.  Copyright  VHI. All rights reserved.
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
## 2015-04-03 NOTE — Telephone Encounter (Signed)
Referral generated today. I'm glad she had her ultrasound done.

## 2015-04-03 NOTE — Telephone Encounter (Signed)
Spoke with patient, she would like the referral to rheumatology. Also she states that she just had the carotid US done when she had the MRI.

## 2015-04-09 ENCOUNTER — Ambulatory Visit: Payer: BC Managed Care – PPO | Admitting: Physical Therapy

## 2015-04-09 ENCOUNTER — Encounter: Payer: Self-pay | Admitting: Family Medicine

## 2015-04-10 ENCOUNTER — Ambulatory Visit (INDEPENDENT_AMBULATORY_CARE_PROVIDER_SITE_OTHER): Payer: BC Managed Care – PPO | Admitting: Family Medicine

## 2015-04-10 ENCOUNTER — Encounter: Payer: Self-pay | Admitting: Family Medicine

## 2015-04-10 VITALS — BP 132/91 | HR 97 | Temp 98.0°F | Wt 250.0 lb

## 2015-04-10 DIAGNOSIS — Z23 Encounter for immunization: Secondary | ICD-10-CM

## 2015-04-10 DIAGNOSIS — F0781 Postconcussional syndrome: Secondary | ICD-10-CM

## 2015-04-10 DIAGNOSIS — N3000 Acute cystitis without hematuria: Secondary | ICD-10-CM

## 2015-04-10 DIAGNOSIS — F329 Major depressive disorder, single episode, unspecified: Secondary | ICD-10-CM | POA: Diagnosis not present

## 2015-04-10 DIAGNOSIS — R300 Dysuria: Secondary | ICD-10-CM

## 2015-04-10 DIAGNOSIS — F32A Depression, unspecified: Secondary | ICD-10-CM

## 2015-04-10 LAB — MICROSCOPIC EXAMINATION

## 2015-04-10 MED ORDER — BUSPIRONE HCL 5 MG PO TABS
ORAL_TABLET | ORAL | Status: DC
Start: 2015-04-10 — End: 2015-05-28

## 2015-04-10 MED ORDER — CITALOPRAM HYDROBROMIDE 20 MG PO TABS: 30.0000 mg | ORAL_TABLET | Freq: Every day | ORAL | Status: DC

## 2015-04-10 MED ORDER — CIPROFLOXACIN HCL 500 MG PO TABS: 500.0000 mg | ORAL_TABLET | Freq: Two times a day (BID) | ORAL | Status: DC

## 2015-04-10 NOTE — Assessment & Plan Note (Signed)
Complicating depression recovery. Continue to follow with neurology. Continue to monitor.

## 2015-04-10 NOTE — Patient Instructions (Signed)
Buspirone tablets What is this medicine? BUSPIRONE (byoo SPYE rone) is used to treat anxiety disorders. This medicine may be used for other purposes; ask your health care provider or pharmacist if you have questions. What should I tell my health care provider before I take this medicine? They need to know if you have any of these conditions: -kidney or liver disease -an unusual or allergic reaction to buspirone, other medicines, foods, dyes, or preservatives -pregnant or trying to get pregnant -breast-feeding How should I use this medicine? Take this medicine by mouth with a glass of water. Follow the directions on the prescription label. You may take this medicine with or without food. To ensure that this medicine always works the same way for you, you should take it either always with or always without food. Take your doses at regular intervals. Do not take your medicine more often than directed. Do not stop taking except on the advice of your doctor or health care professional. Talk to your pediatrician regarding the use of this medicine in children. Special care may be needed. Overdosage: If you think you have taken too much of this medicine contact a poison control center or emergency room at once. NOTE: This medicine is only for you. Do not share this medicine with others. What if I miss a dose? If you miss a dose, take it as soon as you can. If it is almost time for your next dose, take only that dose. Do not take double or extra doses. What may interact with this medicine? Do not take this medicine with any of the following medications: -linezolid -MAOIs like Carbex, Eldepryl, Marplan, Nardil, and Parnate -methylene blue -procarbazine This medicine may also interact with the following medications: -diazepam -digoxin -diltiazem -erythromycin -grapefruit juice -haloperidol -medicines for mental depression or mood problems -medicines for seizures like carbamazepine, phenobarbital  and phenytoin -nefazodone -other medications for anxiety -rifampin -ritonavir -some antifungal medicines like itraconazole, ketoconazole, and voriconazole -verapamil -warfarin This list may not describe all possible interactions. Give your health care provider a list of all the medicines, herbs, non-prescription drugs, or dietary supplements you use. Also tell them if you smoke, drink alcohol, or use illegal drugs. Some items may interact with your medicine. What should I watch for while using this medicine? Visit your doctor or health care professional for regular checks on your progress. It may take 1 to 2 weeks before your anxiety gets better. You may get drowsy or dizzy. Do not drive, use machinery, or do anything that needs mental alertness until you know how this drug affects you. Do not stand or sit up quickly, especially if you are an older patient. This reduces the risk of dizzy or fainting spells. Alcohol can make you more drowsy and dizzy. Avoid alcoholic drinks. What side effects may I notice from receiving this medicine? Side effects that you should report to your doctor or health care professional as soon as possible: -blurred vision or other vision changes -chest pain -confusion -difficulty breathing -feelings of hostility or anger -muscle aches and pains -numbness or tingling in hands or feet -ringing in the ears -skin rash and itching -vomiting -weakness Side effects that usually do not require medical attention (report to your doctor or health care professional if they continue or are bothersome): -disturbed dreams, nightmares -headache -nausea -restlessness or nervousness -sore throat and nasal congestion -stomach upset This list may not describe all possible side effects. Call your doctor for medical advice about side effects. You may report   side effects to FDA at 1-800-FDA-1088. Where should I keep my medicine? Keep out of the reach of children. Store at room  temperature below 30 degrees C (86 degrees F). Protect from light. Keep container tightly closed. Throw away any unused medicine after the expiration date. NOTE: This sheet is a summary. It may not cover all possible information. If you have questions about this medicine, talk to your doctor, pharmacist, or health care provider.    2016, Elsevier/Gold Standard. (2009-12-24 18:06:11)  

## 2015-04-10 NOTE — Assessment & Plan Note (Signed)
Will increase citalopram to 30mg  daily. Will start buspar for anxiety. Recheck 1 month to see how she is doing.

## 2015-04-10 NOTE — Progress Notes (Signed)
BP 132/91 mmHg  Pulse 97  Temp(Src) 98 F (36.7 C)  Wt 250 lb (113.399 kg)  SpO2 98%   Subjective:    Patient ID: Shirley Rogers, female    DOB: [birth year], 49 y.o.   MRN: [id number]  HPI: Shirley Rogers is a 49 y.o. female  Chief Complaint  Patient presents with  . Depression   DEPRESSION- doing a little bit better, but not sure if it's better Mood status: better Satisfied with current treatment?: no Symptom severity: moderate  Duration of current treatment : 1 month Side effects: yes- abdominal pain Medication compliance: excellent compliance Psychotherapy/counseling: no  Depressed mood: yes Anxious mood: yes Anhedonia: no Significant weight loss or gain: no Insomnia: yes hard to fall asleep Fatigue: yes Feelings of worthlessness or guilt: yes Impaired concentration/indecisiveness: yes Suicidal ideations: no Hopelessness: yes Crying spells: yes Depression screen [facility] 2/9 04/10/2015 03/30/2015  Decreased Interest 0 1  Down, Depressed, Hopeless 1 2  PHQ - 2 Score 1 3  Altered sleeping 3 3  Tired, decreased energy 1 2  Change in appetite 0 2  Feeling bad or failure about yourself  1 1  Trouble concentrating 2 2  Moving slowly or fidgety/restless 1 1  Suicidal thoughts 0 0  PHQ-9 Score 9 14  Difficult doing work/chores Somewhat difficult Very difficult   URINARY SYMPTOMS Duration: week Dysuria: no Urinary frequency: yes Urgency: yes Small volume voids: yes Symptom severity: moderate Urinary incontinence: no Foul odor: yes Hematuria: no Abdominal pain: yes Back pain: yes Suprapubic pain/pressure: yes Flank pain: yes Fever:  no Vomiting: no Relief with cranberry juice: no Relief with pyridium: no Status: stable Previous urinary tract infection: yes Recurrent urinary tract infection: no Vaginal discharge: no Treatments attempted: increasing fluids  Relevant past medical, surgical, family and social history reviewed and updated as  indicated. Interim medical history since our last visit reviewed. Allergies and medications reviewed and updated.  Review of Systems  Constitutional: Negative.   Respiratory: Negative.   Cardiovascular: Negative.   Gastrointestinal: Negative.   Genitourinary: Negative.   Psychiatric/Behavioral: Negative.    Per HPI unless specifically indicated above     Objective:    BP 132/91 mmHg  Pulse 97  Temp(Src) 98 F (36.7 C)  Wt 250 lb (113.399 kg)  SpO2 98%  Wt Readings from Last 3 Encounters:  04/10/15 250 lb (113.399 kg)  03/30/15 245 lb (111.131 kg)  03/23/15 245 lb 12.8 oz (111.494 kg)    Physical Exam  Constitutional: She is oriented to person, place, and time. She appears well-developed and well-nourished. No distress.  HENT:  Head: Normocephalic and atraumatic.  Right Ear: Hearing normal.  Left Ear: Hearing normal.  Nose: Nose normal.  Eyes: Conjunctivae and lids are normal. Right eye exhibits no discharge. Left eye exhibits no discharge. No scleral icterus.  Cardiovascular: Normal rate, regular rhythm, normal heart sounds and intact distal pulses.  Exam reveals no gallop and no friction rub.   No murmur heard. Pulmonary/Chest: Effort normal and breath sounds normal. No respiratory distress. She has no wheezes. She has no rales. She exhibits no tenderness.  Musculoskeletal: Normal range of motion.  Neurological: She is alert and oriented to person, place, and time.  Skin: Skin is warm, dry and intact. No rash noted. No erythema. No pallor.  Psychiatric: She has a normal mood and affect. Her speech is normal and behavior is normal. Judgment and thought content normal. Cognition and memory are normal.  Nursing note and vitals  reviewed.   Results for orders placed or performed in visit on 04/09/15  HM COLONOSCOPY  Result Value Ref Range   HM Colonoscopy      diminutive polyp of sigmoid, melanosis coli, diverticulosis coli, follow up colonoscopy in 10 years       Assessment & Plan:   Problem List Items Addressed This Visit      Nervous and Auditory   Post-concussion syndrome    Complicating depression recovery. Continue to follow with neurology. Continue to monitor.       Relevant Medications   citalopram (CELEXA) 20 MG tablet   busPIRone (BUSPAR) 5 MG tablet     Other   Depression - Primary    Will increase citalopram to 30mg  daily. Will start buspar for anxiety. Recheck 1 month to see how she is doing.       Relevant Medications   citalopram (CELEXA) 20 MG tablet   busPIRone (BUSPAR) 5 MG tablet    Other Visit Diagnoses    Dysuria        UA checked today. Shows UTI    Relevant Orders    UA/M w/rflx Culture, Routine    Immunization due        Tdap given today.    Relevant Orders    Tdap vaccine greater than or equal to 7yo IM (Completed)    Acute cystitis without hematuria        Will treat with cipro. Call if not getting better or getting worse.         Follow up plan: Return in about 4 weeks (around 05/08/2015) for Follow up depression.

## 2015-04-11 LAB — UA/M W/RFLX CULTURE, ROUTINE: Organism ID, Bacteria: NO GROWTH

## 2015-04-13 ENCOUNTER — Ambulatory Visit: Payer: BC Managed Care – PPO | Admitting: Family Medicine

## 2015-04-13 ENCOUNTER — Ambulatory Visit
Admission: RE | Admit: 2015-04-13 | Discharge: 2015-04-13 | Disposition: A | Discharge status: Home / Self Care | Payer: BC Managed Care – PPO | Source: Ambulatory Visit | Attending: Family Medicine | Admitting: Family Medicine

## 2015-04-13 DIAGNOSIS — R0989 Other specified symptoms and signs involving the circulatory and respiratory systems: Secondary | ICD-10-CM

## 2015-04-13 DIAGNOSIS — F458 Other somatoform disorders: Secondary | ICD-10-CM | POA: Insufficient documentation

## 2015-04-13 DIAGNOSIS — E042 Nontoxic multinodular goiter: Secondary | ICD-10-CM | POA: Diagnosis not present

## 2015-04-14 ENCOUNTER — Telehealth: Payer: Self-pay | Admitting: Family Medicine

## 2015-04-14 DIAGNOSIS — E041 Nontoxic single thyroid nodule: Secondary | ICD-10-CM

## 2015-04-14 NOTE — Telephone Encounter (Signed)
Called [place] and gave her the results of her soft tissue ultrasound. + thyroid nodule on the R. They recommend biopsy. She would like to proceed. Referral to endocrine generated today. Discussed with Tiffany Foxx.

## 2015-04-16 ENCOUNTER — Ambulatory Visit: Payer: BC Managed Care – PPO | Admitting: Physical Therapy

## 2015-04-16 ENCOUNTER — Encounter: Payer: Self-pay | Admitting: Physical Therapy

## 2015-04-16 DIAGNOSIS — R42 Dizziness and giddiness: Secondary | ICD-10-CM

## 2015-04-16 DIAGNOSIS — R269 Unspecified abnormalities of gait and mobility: Secondary | ICD-10-CM

## 2015-04-16 NOTE — Therapy (Signed)
[place] MAIN [facility] SERVICES [address] [place], [place], [address] Phone: [phone]   Fax:  [phone]  Physical Therapy Treatment  Patient Details  Name: Shirley Rogers MRN: [id number] Date of Birth: [birth year] Referring Provider: Carloyn Manner  Encounter Date: 04/16/2015      PT End of Session - 04/16/15 1430    Visit Number 2   Number of Visits 7   Date for PT Re-Evaluation 05/15/15   Authorization Type no g codes   PT Start Time 1325   PT Stop Time 1412   PT Time Calculation (min) 47 min   Equipment Utilized During Treatment Gait belt   Activity Tolerance Other (comment)  Pt did require multiple rest breaks secondary to dizziness and anxiety   Behavior During Therapy [facility] for tasks assessed/performed      Past Medical History  Diagnosis Date  . Barrett esophagus   . Diverticulosis   . Concussion     1974,1982,1987,2016,2016  . 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 Family history of adverse reaction to anesthesia     father - slow to wake  . Hypertension     no meds currently.  . Stroke ([place])     2000 - no deficits  . Hemiplegic migraine     daily  . Migraine   . GERD (gastroesophageal reflux disease)   . Vertigo     from concussive disorder.  seeing neuro 03/23/15  . Sleep apnea     CPAP machine broken  . Vascular spasm ([place])   . Complicated migraine     with facial drooping  . DDD (degenerative disc disease), lumbar     MRI 2008, L3-4, L5-S1, spondylotic changes, multilevel facet joint hypertrophic changes  . Recurrent sinusitis   . Recurrent UTI     question about IC in the past  . Depression     on paxil, lexapro, cymbalta in the past  . Anxiety     on xanax in the past  . IBS (irritable bowel syndrome)   . Fibrocystic breast disease   . Hyperlipidemia   . Colon polyp   . Hemorrhoids   . ASCVD (arteriosclerotic  cardiovascular disease)   . Insomnia chronic  . Fatty liver 2013    On CT abdomen/pelvis  . Kidney stone 2013    on CT abdomen/pelvis  . Umbilical hernia 0000000    on CT abdomen/pelvis  . Normal cardiac stress test 2009    EF 66%  . Narrowing of intervertebral disc space 2011    C5-6  . Carotid atherosclerosis 2003    on ultrasound    Past Surgical History  Procedure Laterality Date  . Tubal ligation    . Breast surgery Right     atypical hyperplasia  . Colonoscopy w/ biopsies      Removed 5 polyps    There were no vitals filed for this visit.  Visit Diagnosis:  Dizziness and giddiness  Abnormality of gait      Subjective Assessment - 04/16/15 1329    Subjective Pt states she had a thyroid scan and is going to have a thyroid needle biopsy December 1st. Pt reports that her walking is better but turning corners she still feels like she is loosing her balance. Pt states she started walking in the [place]. Pt states she is trying to build up her stamina so she can go  back to work. Pt states she bought a cane and has been using it when she is out in the community. Pt states she over did it yesterday and states her main problem now is she is having panic attacks and states she has had several of them in the past several weeks. Pt states she has been doing her HEP.       Neuromuscular Re-education:   Active eye movement VOR X2: Pt performed active eye movements between two targets horiz 3 reps of 30 seconds each in sitting. Pt reports 5/10 dizziness with this activity.   Performed in sitting visual scanning of letters on mirror to spell words.   Walking while scanning for visual targets: Performed 120' trials times 2 of forward ambulation while scanning for visual targets in hallway with CGA. Pt reports 5/10 dizziness with this activity. Pt required short sitting rest break after this activity.   In // bars, performed feet together on firm surface while doing body turns side  to side and then on Airex pad with WBOS and then narrow BOS performed body turns side to side, multiple 30 second reps of each with CGA.          PT Long Term Goals - 04/03/15 1031    PT LONG TERM GOAL #1   Title Pt will be independent with HEP for symptom management in order to improve function at home and return to work by 05/15/15   Status New   PT LONG TERM GOAL #2   Title Pt will improve ABC by 13% to demonstrate clinically significant improvement in balance confidence and decreased risk for falls by 05/15/15   Baseline 04/03/15: 40%   Status New   PT LONG TERM GOAL #3   Title Pt will decrease DHI by 18 points in order to demonstrate clinically significant decrease in severity of symptoms by 05/15/15   Baseline 04/03/15: 88/100   Status New   PT LONG TERM GOAL #4   Title Pt will improve DGI by 3 points in order to demonstrate ability to perform head turns with ambulation with less disruption to gait by 05/15/15   Baseline 04/03/15: 10/24   Status New   PT LONG TERM GOAL #5   Title Pt will improve foam eyes closed balance to >10 seconds in order to improve balance on compliant surfaces (grass) in low light situations by 05/15/15   Baseline 04/03/15: 3.4 seconds   Status New   Additional Long Term Goals   Additional Long Term Goals Yes   PT LONG TERM GOAL #6   Title Pt will increase gait speed to at least 0.8 m/s in order to resume full function at home and partial function with community ambulation by 05/15/15   Baseline 04/03/15: 0.36 m/s   Status New               Plan - 04/16/15 1432    Clinical Impression Statement Pt arrives to clinic ambulating without AD but with noted decreased scanning of visual environment, decreased gait speed and en bloc movements and demonstrates incresaed difficultyw ith turning required increased time and multiple short steps. Pt does report that she has increased her activity level and is using a cane at times in the community since last  seen in the clinic. Pt reports that her symptoms are improving and that she is encouraged. Pt motivated to session and attempts all activities. Pt  is challenged by all activities in the clinic this date. Pt would benefit  from continued PT services to address goals and to reduce symptoms.    Pt will benefit from skilled therapeutic intervention in order to improve on the following deficits Abnormal gait;Decreased balance;Dizziness   Rehab Potential Good   Clinical Impairments Affecting Rehab Potential Positive: motivation, age; Negative: chronicity, repeated concussions   PT Frequency 1x / week   PT Duration 6 weeks   PT Treatment/Interventions ADLs/Self Care Home Management;Canalith Repostioning;DME Instruction;Gait training;Stair training;Functional mobility training;Therapeutic activities;Therapeutic exercise;Balance training;Neuromuscular re-education;Patient/family education;Manual techniques;Vestibular   PT Next Visit Plan If patient reports that she has been doing well with HEP progressions consider requesting change to treatment frequency to twice per week as pt requests to increase frequency as she is eager to return to work. Continue to gradually progress vestibular and balance exercises per pt tolerance level. Review HEP and consider doing ambulation with head  turns , retro ambulation while scanning for targets.    PT Home Exercise Plan NBOS with slow horizontal head turns 3 x 30 seconds, 4 times/day   Consulted and Agree with Plan of Care Patient        Problem List Patient Active Problem List   Diagnosis Date Noted  . 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 Globus sensation 03/30/2015  . Post-concussion syndrome 03/13/2015  . Depression 03/13/2015  . Hypertension   . Barrett esophagus   . Diverticulosis   . Hemiplegic migraine   . Lupus ([place])   . Stroke [facility])    Lady Deutscher PT, DPT T[place]ia Harral 04/16/2015, 2:47 PM  [place] MAIN [facility] SERVICES [address] [place], [place], [address] Phone: [phone]   Fax:  [phone]  Name: Thaleia Cozens MRN: [id number] Date of Birth: August 31, 1965

## 2015-04-20 ENCOUNTER — Encounter: Payer: Self-pay | Admitting: Physical Therapy

## 2015-04-20 ENCOUNTER — Ambulatory Visit: Payer: BC Managed Care – PPO | Admitting: Physical Therapy

## 2015-04-20 DIAGNOSIS — R42 Dizziness and giddiness: Secondary | ICD-10-CM | POA: Diagnosis not present

## 2015-04-20 DIAGNOSIS — R269 Unspecified abnormalities of gait and mobility: Secondary | ICD-10-CM

## 2015-04-20 NOTE — Addendum Note (Signed)
Addended by: Lorna Dibble on: 04/20/2015 03:37 PM   Modules accepted: Orders

## 2015-04-20 NOTE — Therapy (Addendum)
[place] MAIN [facility] SERVICES [address] [place], [place], [address] Phone: [phone]   Fax:  [phone]  Physical Therapy Treatment  Patient Details  Name: Shirley Rogers MRN: [id number] Date of Birth: 1966-01-07 Referring Provider: Carloyn Manner  Encounter Date: 04/20/2015      PT End of Session - 04/20/15 0954    Visit Number 3   Number of Visits 10   Date for PT Re-Evaluation 05/15/15   Authorization Type no g codes   PT Start Time 0900   PT Stop Time 0946   PT Time Calculation (min) 46 min   Equipment Utilized During Treatment Gait belt   Activity Tolerance Patient tolerated treatment well   Behavior During Therapy [facility] for tasks assessed/performed      Past Medical History  Diagnosis Date  . Barrett esophagus   . Diverticulosis   . Concussion     1974,1982,1987,2016,2016  . 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 Family history of adverse reaction to anesthesia     father - slow to wake  . Hypertension     no meds currently.  . Stroke ([place])     2000 - no deficits  . Hemiplegic migraine     daily  . Migraine   . GERD (gastroesophageal reflux disease)   . Vertigo     from concussive disorder.  seeing neuro 03/23/15  . Sleep apnea     CPAP machine broken  . Vascular spasm ([place])   . Complicated migraine     with facial drooping  . DDD (degenerative disc disease), lumbar     MRI 2008, L3-4, L5-S1, spondylotic changes, multilevel facet joint hypertrophic changes  . Recurrent sinusitis   . Recurrent UTI     question about IC in the past  . Depression     on paxil, lexapro, cymbalta in the past  . Anxiety     on xanax in the past  . IBS (irritable bowel syndrome)   . Fibrocystic breast disease   . Hyperlipidemia   . Colon polyp   . Hemorrhoids   . ASCVD (arteriosclerotic cardiovascular disease)   . Insomnia chronic  . Fatty liver  2013    On CT abdomen/pelvis  . Kidney stone 2013    on CT abdomen/pelvis  . Umbilical hernia 0000000    on CT abdomen/pelvis  . Normal cardiac stress test 2009    EF 66%  . Narrowing of intervertebral disc space 2011    C5-6  . Carotid atherosclerosis 2003    on ultrasound    Past Surgical History  Procedure Laterality Date  . Tubal ligation    . Breast surgery Right     atypical hyperplasia  . Colonoscopy w/ biopsies      Removed 5 polyps    There were no vitals filed for this visit.  Visit Diagnosis:  Dizziness and giddiness  Abnormality of gait      Subjective Assessment - 04/20/15 0902    Subjective Pt reports she did well with the new exercises this past week. Pt states she is eager to return to work. Pt reports that the HEP is still challenging but is getting a little easier. Pt reports that she has been trying to get up at the time she would for work as she is trying to get back to her prior routine. Pt states she feels drained at  times due to the increase in her activity level.         Neuromuscular Re-education: VOR X 2 horiz 3 reps 30 seconds in sitting. Pt demonstrates good technique.   Active eye movement between 2 Targets: Performed in sitting active eye movements between 2 targets: 2 reps of 30 sec and 1 rep of 1 minute. Pt reports 5/10 dizziness.  Ball Toss:  Performed static standing ball toss side to side and then circles around her back.  Airex pad activities:  NBOS EC 30 second trials Feet together static holds with and without horiz head turns semi-tandem stance progression holds with alternate lead leg without head turns.   Walking head turns: Performed multiple 76' reps of walking with horiz and vert head turns forward and retro and forward walking with ball toss over shoulder.   Quick Turns: Walking 10' with alternating L/R quick turns 5 reps. Pt was able to increase her turning rate but it is still below average from expected given her age.           PT Long Term Goals - 04/03/15 1031    PT LONG TERM GOAL #1   Title Pt will be independent with HEP for symptom management in order to improve function at home and return to work by 05/15/15   Status New   PT LONG TERM GOAL #2   Title Pt will improve ABC by 13% to demonstrate clinically significant improvement in balance confidence and decreased risk for falls by 05/15/15   Baseline 04/03/15: 40%   Status New   PT LONG TERM GOAL #3   Title Pt will decrease DHI by 18 points in order to demonstrate clinically significant decrease in severity of symptoms by 05/15/15   Baseline 04/03/15: 88/100   Status New   PT LONG TERM GOAL #4   Title Pt will improve DGI by 3 points in order to demonstrate ability to perform head turns with ambulation with less disruption to gait by 05/15/15   Baseline 04/03/15: 10/24   Status New   PT LONG TERM GOAL #5   Title Pt will improve foam eyes closed balance to >10 seconds in order to improve balance on compliant surfaces (grass) in low light situations by 05/15/15   Baseline 04/03/15: 3.4 seconds   Status New   Additional Long Term Goals   Additional Long Term Goals Yes   PT LONG TERM GOAL #6   Title Pt will increase gait speed to at least 0.8 m/s in order to resume full function at home and partial function with community ambulation by 05/15/15   Baseline 04/03/15: 0.36 m/s   Status New               Plan - 04/20/15 0954    Clinical Impression Statement Pt reporting improvement in her symptoms and increase in her activity levels. Pt progressing well with therapy and able to tolerate advancement of HEP last visit and worked on further progerssions of balance and vestibular activities this date. Pt eager to return to work and is requesting to increase treatment frequency to 2 times per week. Encouraged pt to continue to follow-up as scheduled.    Pt will benefit from skilled therapeutic intervention in order to improve on the following  deficits Abnormal gait;Decreased balance;Dizziness   Rehab Potential Good   Clinical Impairments Affecting Rehab Potential Positive: motivation, age; Negative: chronicity, repeated concussions   PT Frequency 2x / week   PT Duration 6 weeks   PT  Treatment/Interventions ADLs/Self Care Home Management;Canalith Repostioning;DME Instruction;Gait training;Stair training;Functional mobility training;Therapeutic activities;Therapeutic exercise;Balance training;Neuromuscular re-education;Patient/family education;Manual techniques;Vestibular   PT Next Visit Plan Continue to work on quick turn activities- consider slow hallway ball toss activity, forward and retro ball toss activity. Review HEP.    PT Home Exercise Plan NBOS with slow horizontal head turns 3 x 30 seconds, 4 times/day   Consulted and Agree with Plan of Care Patient        Problem List Patient Active Problem List   Diagnosis Date Noted  . 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 Globus sensation 03/30/2015  . Post-concussion syndrome 03/13/2015  . Depression 03/13/2015  . Hypertension   . Barrett esophagus   . Diverticulosis   . Hemiplegic migraine   . Lupus ([place])   . Stroke [facility])    Lady Deutscher PT, DPT Murphy,Dorriea 04/20/2015, 3:29 PM  [place] MAIN [facility] SERVICES [address] [place], [place], [address] Phone: [phone]   Fax:  [phone]  Name: Shirley Rogers MRN: [id number] Date of Birth: Oct 08, 1965

## 2015-04-21 ENCOUNTER — Ambulatory Visit: Payer: BC Managed Care – PPO | Admitting: Physical Therapy

## 2015-04-27 ENCOUNTER — Ambulatory Visit: Payer: BC Managed Care – PPO

## 2015-04-27 DIAGNOSIS — R42 Dizziness and giddiness: Secondary | ICD-10-CM | POA: Diagnosis not present

## 2015-04-27 DIAGNOSIS — R269 Unspecified abnormalities of gait and mobility: Secondary | ICD-10-CM

## 2015-04-27 NOTE — Therapy (Signed)
[place] MAIN [facility] SERVICES [address] [place], [place], [address] Phone: [phone]   Fax:  [phone]  Physical Therapy Treatment  Patient Details  Name: Shirley Rogers MRN: [id number] Date of Birth: [birth year] Referring Provider: Carloyn Manner  Encounter Date: 04/27/2015      PT End of Session - 04/27/15 1501    Visit Number 4   Number of Visits 10   Date for PT Re-Evaluation 05/15/15   Authorization Type no g codes   PT Start Time 1400   PT Stop Time 1445   PT Time Calculation (min) 45 min   Equipment Utilized During Treatment Gait belt   Activity Tolerance Patient tolerated treatment well   Behavior During Therapy [facility] for tasks assessed/performed      Past Medical History  Diagnosis Date  . Barrett esophagus   . Diverticulosis   . Concussion     1974,1982,1987,2016,2016  . 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 Family history of adverse reaction to anesthesia     father - slow to wake  . Hypertension     no meds currently.  . Stroke ([place])     2000 - no deficits  . Hemiplegic migraine     daily  . Migraine   . GERD (gastroesophageal reflux disease)   . Vertigo     from concussive disorder.  seeing neuro 03/23/15  . Sleep apnea     CPAP machine broken  . Vascular spasm ([place])   . Complicated migraine     with facial drooping  . DDD (degenerative disc disease), lumbar     MRI 2008, L3-4, L5-S1, spondylotic changes, multilevel facet joint hypertrophic changes  . Recurrent sinusitis   . Recurrent UTI     question about IC in the past  . Depression     on paxil, lexapro, cymbalta in the past  . Anxiety     on xanax in the past  . IBS (irritable bowel syndrome)   . Fibrocystic breast disease   . Hyperlipidemia   . Colon polyp   . Hemorrhoids   . ASCVD (arteriosclerotic cardiovascular disease)   . Insomnia chronic  . Fatty liver  2013    On CT abdomen/pelvis  . Kidney stone 2013    on CT abdomen/pelvis  . Umbilical hernia 0000000    on CT abdomen/pelvis  . Normal cardiac stress test 2009    EF 66%  . Narrowing of intervertebral disc space 2011    C5-6  . Carotid atherosclerosis 2003    on ultrasound    Past Surgical History  Procedure Laterality Date  . Tubal ligation    . Breast surgery Right     atypical hyperplasia  . Colonoscopy w/ biopsies      Removed 5 polyps    There were no vitals filed for this visit.  Visit Diagnosis:  Dizziness and giddiness  Abnormality of gait      Subjective Assessment - 04/27/15 1411    Subjective Pt states that she has been forcing herself to go to the grocery store and [employer] (once) since her last therapy session. She states that she did have an anxiety attack in [employer] with diaphoresis and rapid heart rate.  Pt reports intermittent dizziness with exercises as well. Overall she believes that she is at least 25% improved since starting therapy. She has been performing HEP and states that  she even performed some of the ambulation with head turning exercises with her brother over the holiday who is a PTA. She did hit her head  on the doorframe of the car but does not report any increased dizziness since that episode. No specific questions or concerns at this time. Pt is eager to get back to work.    Currently in Pain? No/denies        Neuromuscular Re-education:  Active eye movement between 2 Targets: Performed in sitting active eye movements between 2 targets: 3 reps of 1 minute. Pt reports 4/10 dizziness. Pt progressed exercise independently at home to 60 seconds;  VOR VOR X 2 horiz 2 reps 60 seconds in sitting. Pt demonstrates good technique; VOR X 2 vertical 1 reps 60 seconds in sitting (5/10), added to HEP;  [employer] Toss:  Performed static standing on [employer] ball toss side to side in doorway WBOS x 30 seconds (3/10), NBOS (4/10) x 30 seconds, NBOS  diagonals x 30 seconds to each side (3/10);  Walking [employer]; Performed multiple 75' reps of walking with horiz and vert ball toss as well as ball toss over shoulder varying height between head, shoulder, and waist;   Quick Turns: Forward walking 10' with alternating L/R quick turns 5 reps each direction followed by retro walking 10' with alternating L/R quick turns 5 reps each. Pt demonstrates improving speed with turns and does not require stepping to correct for balance.                            PT Education - 04/27/15 1501    Education provided Yes   Education Details Reinforced HEP and progress VOR x 2   Person(s) Educated Patient   Methods Explanation;Demonstration   Comprehension Verbalized understanding             PT Long Term Goals - 04/03/15 1031    PT LONG TERM GOAL #1   Title Pt will be independent with HEP for symptom management in order to improve function at home and return to work by 05/15/15   Status New   PT LONG TERM GOAL #2   Title Pt will improve ABC by 13% to demonstrate clinically significant improvement in balance confidence and decreased risk for falls by 05/15/15   Baseline 04/03/15: 40%   Status New   PT LONG TERM GOAL #3   Title Pt will decrease DHI by 18 points in order to demonstrate clinically significant decrease in severity of symptoms by 05/15/15   Baseline 04/03/15: 88/100   Status New   PT LONG TERM GOAL #4   Title Pt will improve DGI by 3 points in order to demonstrate ability to perform head turns with ambulation with less disruption to gait by 05/15/15   Baseline 04/03/15: 10/24   Status New   PT LONG TERM GOAL #5   Title Pt will improve foam eyes closed balance to >10 seconds in order to improve balance on compliant surfaces (grass) in low light situations by 05/15/15   Baseline 04/03/15: 3.4 seconds   Status New   Additional Long Term Goals   Additional Long Term Goals Yes   PT LONG TERM GOAL #6   Title Pt  will increase gait speed to at least 0.8 m/s in order to resume full function at home and partial function with community ambulation by 05/15/15   Baseline 04/03/15: 0.36 m/s   Status New  Plan - 04/27/15 1504    Clinical Impression Statement Pt states that her symptoms are improving and she is being intentional about increasing her activity level and exposure. She is self-progressing duration of exercises as well as difficulty at home which is excellent. Pt provided progression of VOR x 2 today to incorporate vertical direction which increases symptoms on this date. Pt encouraged to continue additional HEP and progress as she feels safe and comfortable without significant increase in symptoms. Pt encouraged to follow-up as scheduled.    Pt will benefit from skilled therapeutic intervention in order to improve on the following deficits Abnormal gait;Decreased balance;Dizziness   Rehab Potential Good   Clinical Impairments Affecting Rehab Potential Positive: motivation, age; Negative: chronicity, repeated concussions   PT Frequency 2x / week   PT Duration 6 weeks   PT Treatment/Interventions ADLs/Self Care Home Management;Canalith Repostioning;DME Instruction;Gait training;Stair training;Functional mobility training;Therapeutic activities;Therapeutic exercise;Balance training;Neuromuscular re-education;Patient/family education;Manual techniques;Vestibular   PT Next Visit Plan Continue to work on quick turn activities, progress VOR x 2 to conflicting background as tolerated, continue forward and retro ball toss activity. Progress HEP as able    PT Home Exercise Plan NBOS with slow horizontal head turns 3 x 30 seconds, 4 times/day, VOR x 2 horizontal 60 seconds x 2 reps/vertical x 2 reps twice/day;   Consulted and Agree with Plan of Care Patient        Problem List Patient Active Problem List   Diagnosis Date Noted  . 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  Globus sensation 03/30/2015  . Post-concussion syndrome 03/13/2015  . Depression 03/13/2015  . Hypertension   . Barrett esophagus   . Diverticulosis   . Hemiplegic migraine   . Lupus ([place])   . Stroke [facility])    Phillips Grout PT, DPT   Cara Aguino 04/27/2015, 3:07 PM  [place] MAIN [facility] SERVICES [address] [place], [place], [address] Phone: [phone]   Fax:  [phone]  Name: Shirley Rogers MRN: [id number] Date of Birth: 08/16/1965

## 2015-04-29 ENCOUNTER — Ambulatory Visit: Payer: BC Managed Care – PPO

## 2015-04-29 ENCOUNTER — Other Ambulatory Visit: Payer: Self-pay | Admitting: Family Medicine

## 2015-04-29 VITALS — BP 142/94 | HR 91

## 2015-04-29 DIAGNOSIS — R269 Unspecified abnormalities of gait and mobility: Secondary | ICD-10-CM

## 2015-04-29 DIAGNOSIS — R42 Dizziness and giddiness: Secondary | ICD-10-CM

## 2015-04-29 NOTE — Therapy (Signed)
[place] MAIN [facility] SERVICES [address] [place], [place], [address] Phone: [phone]   Fax:  [phone]  Physical Therapy Treatment  Patient Details  Name: Shirley Rogers MRN: [id number] Date of Birth: 09-13-1965 Referring Provider: Carloyn Manner  Encounter Date: 04/29/2015      PT End of Session - 04/29/15 1646    Visit Number 5   Number of Visits 10   Date for PT Re-Evaluation 05/15/15   Authorization Type no g codes   PT Start Time [id number]   PT Stop Time 1620   PT Time Calculation (min) 45 min   Equipment Utilized During Treatment Gait belt   Activity Tolerance Patient tolerated treatment well   Behavior During Therapy [facility] for tasks assessed/performed      Past Medical History  Diagnosis Date  . Barrett esophagus   . Diverticulosis   . Concussion     1974,1982,1987,2016,2016  . 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 Family history of adverse reaction to anesthesia     father - slow to wake  . Hypertension     no meds currently.  . Stroke ([place])     2000 - no deficits  . Hemiplegic migraine     daily  . Migraine   . GERD (gastroesophageal reflux disease)   . Vertigo     from concussive disorder.  seeing neuro 03/23/15  . Sleep apnea     CPAP machine broken  . Vascular spasm ([place])   . Complicated migraine     with facial drooping  . DDD (degenerative disc disease), lumbar     MRI 2008, L3-4, L5-S1, spondylotic changes, multilevel facet joint hypertrophic changes  . Recurrent sinusitis   . Recurrent UTI     question about IC in the past  . Depression     on paxil, lexapro, cymbalta in the past  . Anxiety     on xanax in the past  . IBS (irritable bowel syndrome)   . Fibrocystic breast disease   . Hyperlipidemia   . Colon polyp   . Hemorrhoids   . ASCVD (arteriosclerotic cardiovascular disease)   . Insomnia chronic  . Fatty liver  2013    On CT abdomen/pelvis  . Kidney stone 2013    on CT abdomen/pelvis  . Umbilical hernia 0000000    on CT abdomen/pelvis  . Normal cardiac stress test 2009    EF 66%  . Narrowing of intervertebral disc space 2011    C5-6  . Carotid atherosclerosis 2003    on ultrasound    Past Surgical History  Procedure Laterality Date  . Tubal ligation    . Breast surgery Right     atypical hyperplasia  . Colonoscopy w/ biopsies      Removed 5 polyps    Filed Vitals:   04/29/15 1537  BP: 142/94  Pulse: 91  SpO2: 100%    Visit Diagnosis:  Dizziness and giddiness  Abnormality of gait      Subjective Assessment - 04/29/15 1540    Subjective Pt reports she was really fatigued after last session and the following day. She was however able to complete her HEP yesterday. Pt does report a trip to [employer] since last session and states she made it all the way to the back of the store and started to have a panic attack and had to leave. Pt reports that  overall she feels 70% improved but is anxious about worsening symptoms with return to more stimulating environments. She would like to know if she could try going to [employer] early in the morning to get on the stationary bike. No pain currently.   Currently in Pain? No/denies      Neuromuscular Re-education:  VOR VOR X 2 horiz x 60 seconds in sitting with conflicting background. Pt demonstrates good technique (3/10); VOR X 2 vertical x 60 seconds in sitting with conflicting background ([id number]); VOR X 2 horiz x 60 seconds in standing WBOS with conflicting background ([id number]); VOR X 2 vertical x 60 seconds in standing NBOS with conflicting background (0000000);  Active eye movement between 2 Targets: Performed in standing NBOS with conflicting background active eye movements between 2 targets: 2 reps of 1 minute. Pt reports 4/10 dizziness with first rep, 5/10 dizziness with second rep;.   Walking [employer]; Vertical ball toss to self 75' x 2  (0/10 dizziness); Horizontal ball toss to self 75' x 2 each direction (2/10 dizziness);   Retro ambulation ball toss over shoulder varying height between head, shoulder, and waist 75' x 2 (2/10); Forward ambulation with ball dribble from side to side with head/eye follow 75' x 2, no reported dizziness  Stairs Ascend/descend 2 flights step-over-step pattern no UE support; (anxiety with descend) Ascend/descend 2 flights step-over-step pattern no UE support with naming task (girls/boys names A-Z) (2/10 dizziness with anxiety) Ascend/descend 2 flights step-over-step pattern no UE support with serial 7's and cone tapping (4/10 dizziness with anxiety)  Education regarding graded exercise program at [employer] as well as graded exposure to community including stores/restaurants.                            PT Education - 04/29/15 1646    Education provided Yes   Education Details Progressed HEP, discussed graded exercise program   Person(s) Educated Patient   Methods Explanation   Comprehension Verbalized understanding             PT Long Term Goals - 04/03/15 1031    PT LONG TERM GOAL #1   Title Pt will be independent with HEP for symptom management in order to improve function at home and return to work by 05/15/15   Status New   PT LONG TERM GOAL #2   Title Pt will improve ABC by 13% to demonstrate clinically significant improvement in balance confidence and decreased risk for falls by 05/15/15   Baseline 04/03/15: 40%   Status New   PT LONG TERM GOAL #3   Title Pt will decrease DHI by 18 points in order to demonstrate clinically significant decrease in severity of symptoms by 05/15/15   Baseline 04/03/15: 88/100   Status New   PT LONG TERM GOAL #4   Title Pt will improve DGI by 3 points in order to demonstrate ability to perform head turns with ambulation with less disruption to gait by 05/15/15   Baseline 04/03/15: 10/24   Status New   PT LONG TERM GOAL  #5   Title Pt will improve foam eyes closed balance to >10 seconds in order to improve balance on compliant surfaces (grass) in low light situations by 05/15/15   Baseline 04/03/15: 3.4 seconds   Status New   Additional Long Term Goals   Additional Long Term Goals Yes   PT LONG TERM GOAL #6   Title Pt will increase gait speed  to at least 0.8 m/s in order to resume full function at home and partial function with community ambulation by 05/15/15   Baseline 04/03/15: 0.36 m/s   Status New               Plan - 04/29/15 1647    Clinical Impression Statement Pt reports overall she feels approximately 70% improvement in symptoms since starting therapy. She did recently increase her Celexa dosage to 1.5 tabs/day. Pt educated about returning to [employer] at times when they are not busy for 10-15 minutes of stationary biking initially to assess tolerance. If symptoms worsen or return then decrease intensity and/or duration. Gradually increase intensity/duration (no more than 60% max HR or 6/10 on RPE and increase by no more than 5 minutes at a time). Pt reports increased anxiety and dizziness with stair ascend/descend (particulaly with descend) today. Will continue to increasing exposure to stimuli during therapy session with goal of ambulation in medical mall with head turns, pivoting, and cognitive tasks.    Pt will benefit from skilled therapeutic intervention in order to improve on the following deficits Abnormal gait;Decreased balance;Dizziness   Rehab Potential Good   Clinical Impairments Affecting Rehab Potential Positive: motivation, age; Negative: chronicity, repeated concussions   PT Frequency 2x / week   PT Duration 6 weeks   PT Treatment/Interventions ADLs/Self Care Home Management;Canalith Repostioning;DME Instruction;Gait training;Stair training;Functional mobility training;Therapeutic activities;Therapeutic exercise;Balance training;Neuromuscular re-education;Patient/family  education;Manual techniques;Vestibular   PT Next Visit Plan Continue to work on quick turn activities, progress VOR x 2 to conflicting background as tolerated, continue forward and retro ball toss activity. Progress HEP as able    PT Home Exercise Plan NBOS with slow horizontal head turns 3 x 30 seconds on pillow, 4 times/day, VOR x 2 horizontal 60 seconds x 2 reps/vertical x 2 reps twice/day NBOS with conflicting background, gait in hallway with head turns to view cards on wall.   Consulted and Agree with Plan of Care Patient        Problem List Patient Active Problem List   Diagnosis Date Noted  . 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 Globus sensation 03/30/2015  . Post-concussion syndrome 03/13/2015  . Depression 03/13/2015  . Hypertension   . Barrett esophagus   . Diverticulosis   . Hemiplegic migraine   . Lupus ([place])   . Stroke [facility])    Phillips Grout PT, DPT   Huprich,Jason 04/29/2015, 4:53 PM  [place] MAIN [facility] SERVICES [address] [place], [place], [address] Phone: [phone]   Fax:  [phone]  Name: Shirley Rogers MRN: [id number] Date of Birth: [birth year]

## 2015-05-06 ENCOUNTER — Ambulatory Visit: Payer: BC Managed Care – PPO | Attending: Otolaryngology | Admitting: Physical Therapy

## 2015-05-06 ENCOUNTER — Encounter: Payer: Self-pay | Admitting: Physical Therapy

## 2015-05-06 VITALS — BP 142/95 | HR 94

## 2015-05-06 DIAGNOSIS — R42 Dizziness and giddiness: Secondary | ICD-10-CM | POA: Diagnosis not present

## 2015-05-06 DIAGNOSIS — R269 Unspecified abnormalities of gait and mobility: Secondary | ICD-10-CM | POA: Diagnosis present

## 2015-05-06 NOTE — Therapy (Signed)
[place] MAIN [facility] SERVICES [address] [place], [place], [address] Phone: [phone]   Fax:  [phone]  Physical Therapy Treatment  Patient Details  Name: Shirley Rogers MRN: [id number] Date of Birth: Mar 27, 1966 Referring Provider: Carloyn Manner  Encounter Date: 05/06/2015      PT End of Session - 05/06/15 1008    Visit Number 6   Number of Visits 10   Date for PT Re-Evaluation 05/15/15   Authorization Type no g codes   PT Start Time 1000   PT Stop Time 1045   PT Time Calculation (min) 45 min   Equipment Utilized During Treatment Gait belt   Activity Tolerance Patient tolerated treatment well   Behavior During Therapy [facility] for tasks assessed/performed      Past Medical History  Diagnosis Date  . Barrett esophagus   . Diverticulosis   . Concussion     1974,1982,1987,2016,2016  . 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 Family history of adverse reaction to anesthesia     father - slow to wake  . Hypertension     no meds currently.  . Stroke ([place])     2000 - no deficits  . Hemiplegic migraine     daily  . Migraine   . GERD (gastroesophageal reflux disease)   . Vertigo     from concussive disorder.  seeing neuro 03/23/15  . Sleep apnea     CPAP machine broken  . Vascular spasm ([place])   . Complicated migraine     with facial drooping  . DDD (degenerative disc disease), lumbar     MRI 2008, L3-4, L5-S1, spondylotic changes, multilevel facet joint hypertrophic changes  . Recurrent sinusitis   . Recurrent UTI     question about IC in the past  . Depression     on paxil, lexapro, cymbalta in the past  . Anxiety     on xanax in the past  . IBS (irritable bowel syndrome)   . Fibrocystic breast disease   . Hyperlipidemia   . Colon polyp   . Hemorrhoids   . ASCVD (arteriosclerotic cardiovascular disease)   . Insomnia chronic  . Fatty liver  2013    On CT abdomen/pelvis  . Kidney stone 2013    on CT abdomen/pelvis  . Umbilical hernia 0000000    on CT abdomen/pelvis  . Normal cardiac stress test 2009    EF 66%  . Narrowing of intervertebral disc space 2011    C5-6  . Carotid atherosclerosis 2003    on ultrasound    Past Surgical History  Procedure Laterality Date  . Tubal ligation    . Breast surgery Right     atypical hyperplasia  . Colonoscopy w/ biopsies      Removed 5 polyps    Filed Vitals:   05/06/15 1002  BP: 142/95  Pulse: 94  SpO2: 98%    Visit Diagnosis:  Dizziness and giddiness  Abnormality of gait      Subjective Assessment - 05/06/15 0957    Subjective Pt states she is feeling unwell today. She had GI upset last Thursday and was vomiting. Pt started having R sided flank pain and went to see her MD who told her she had pleurisy. She was placed on a steroid taper. Pt also underwent a thyroid biopsy yesterday. Pt moving very slow on arrival today. No specific questions currently.  Currently in Pain? No/denies       Neuromuscular Re-education:  VOR VOR X 2 horiz x 60 seconds in sitting with connect 4 board focusing on token in the middle (2/10); VOR X 2 vertical x 60 seconds in standing NBOS wwith connect 4 board focusing on token in the middle (4/10); VOR X 2 horizontal x 60 seconds in standing NBOS wwith connect 4 board focusing on token in the middle (3/10); VOR x 1 horizontal with forward and retro ambulation (very slow head turns) 35' x 2;  Remembered Targets Seated remembered target with movement in different planes 60 seconds x 3;  Active eye movement between 2 Targets: Performed in seated and then standing NBOS with conflicting background active eye movements between 2 targets: 3 reps of 1 minute. Pt reports 4/10 dizziness;  Airex Airex eyes closed NBOS x 30 seconds; Firm eyes closed marching x 30 seconds; Airex eyes closed marching x 30 seconds (6/10);  Turns Forward/retro  ambulation with quick turns on command   Education regarding increased environments with auditory and visual stimulus. Possible shadowing at school in a different room to reintroduce stimulus and practice returning to school.                        PT Education - 05/06/15 1006    Education provided Yes   Education Details Reviewed HEP and progressed   Person(s) Educated Patient   Methods Explanation   Comprehension Verbalized understanding             PT Long Term Goals - 04/03/15 1031    PT LONG TERM GOAL #1   Title Pt will be independent with HEP for symptom management in order to improve function at home and return to work by 05/15/15   Status New   PT LONG TERM GOAL #2   Title Pt will improve ABC by 13% to demonstrate clinically significant improvement in balance confidence and decreased risk for falls by 05/15/15   Baseline 04/03/15: 40%   Status New   PT LONG TERM GOAL #3   Title Pt will decrease DHI by 18 points in order to demonstrate clinically significant decrease in severity of symptoms by 05/15/15   Baseline 04/03/15: 88/100   Status New   PT LONG TERM GOAL #4   Title Pt will improve DGI by 3 points in order to demonstrate ability to perform head turns with ambulation with less disruption to gait by 05/15/15   Baseline 04/03/15: 10/24   Status New   PT LONG TERM GOAL #5   Title Pt will improve foam eyes closed balance to >10 seconds in order to improve balance on compliant surfaces (grass) in low light situations by 05/15/15   Baseline 04/03/15: 3.4 seconds   Status New   Additional Long Term Goals   Additional Long Term Goals Yes   PT LONG TERM GOAL #6   Title Pt will increase gait speed to at least 0.8 m/s in order to resume full function at home and partial function with community ambulation by 05/15/15   Baseline 04/03/15: 0.36 m/s   Status New               Plan - 05/06/15 1008    Clinical Impression Statement Pt is somewhat  limited today due to R flank pain from pleurisy. Limited session to avoid excessive reaching and ball passes. Pt reports increase in dizziness today with remembered targets as well as VOR x 2  in standing with conflicting backgrounds. Discussed slow progression to more visually and auditory stimulating environments. Discussed trying to find videos online as well in first person view to introduce stimulus in a controlled environment. Progressed HEP on this date to include remembered targets. As pt is feeling better plan to repeat stair training as well as ambulation in medical mall with head turning.    Pt will benefit from skilled therapeutic intervention in order to improve on the following deficits Abnormal gait;Decreased balance;Dizziness   Rehab Potential Good   Clinical Impairments Affecting Rehab Potential Positive: motivation, age; Negative: chronicity, repeated concussions   PT Frequency 2x / week   PT Duration 6 weeks   PT Treatment/Interventions ADLs/Self Care Home Management;Canalith Repostioning;DME Instruction;Gait training;Stair training;Functional mobility training;Therapeutic activities;Therapeutic exercise;Balance training;Neuromuscular re-education;Patient/family education;Manual techniques;Vestibular   PT Next Visit Plan Continue to work on quick turn activities, remembered targets, stairs and medical mall as able, continue forward and retro ball toss activity. Progress HEP as able    PT Home Exercise Plan NBOS with slow horizontal head turns 3 x 30 seconds on pillow, 4 times/day, VOR x 2 horizontal 60 seconds x 2 reps/vertical x 2 reps twice/day NBOS with conflicting background, gait in hallway with head turns to view cards on wall, remembered targets   Consulted and Agree with Plan of Care Patient        Problem List Patient Active Problem List   Diagnosis Date Noted  . 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 Globus sensation 03/30/2015  . Post-concussion  syndrome 03/13/2015  . Depression 03/13/2015  . Hypertension   . Barrett esophagus   . Diverticulosis   . Hemiplegic migraine   . Lupus ([place])   . Stroke [facility])    Phillips Grout PT, DPT   Julian Askin 05/06/2015, 4:28 PM  [place] MAIN [facility] SERVICES [address] [place], [place], [address] Phone: [phone]   Fax:  [phone]  Name: Sotheary Grodi MRN: [id number] Date of Birth: Feb 08, 1966

## 2015-05-07 ENCOUNTER — Ambulatory Visit: Payer: BC Managed Care – PPO | Admitting: Physical Therapy

## 2015-05-08 ENCOUNTER — Encounter: Payer: Self-pay | Admitting: Family Medicine

## 2015-05-08 ENCOUNTER — Ambulatory Visit (INDEPENDENT_AMBULATORY_CARE_PROVIDER_SITE_OTHER): Payer: BC Managed Care – PPO | Admitting: Family Medicine

## 2015-05-08 VITALS — BP 151/99 | HR 108 | Temp 99.0°F | Ht 65.0 in | Wt 251.8 lb

## 2015-05-08 DIAGNOSIS — Z87440 Personal history of urinary (tract) infections: Secondary | ICD-10-CM | POA: Diagnosis not present

## 2015-05-08 DIAGNOSIS — F32A Depression, unspecified: Secondary | ICD-10-CM

## 2015-05-08 DIAGNOSIS — F329 Major depressive disorder, single episode, unspecified: Secondary | ICD-10-CM

## 2015-05-08 DIAGNOSIS — R1011 Right upper quadrant pain: Secondary | ICD-10-CM | POA: Diagnosis not present

## 2015-05-08 LAB — CBC WITH DIFFERENTIAL/PLATELET
HEMATOCRIT: 40.5 % (ref 34.0–46.6)
HEMOGLOBIN: 14.2 g/dL (ref 11.1–15.9)
Lymphocytes Absolute: 0.9 10*3/uL (ref 0.7–3.1)
Lymphs: 10 %
MCH: 31.7 pg (ref 26.6–33.0)
MCHC: 35.1 g/dL (ref 31.5–35.7)
MCV: 90 fL (ref 79–97)
MID (Absolute): 0.2 10*3/uL (ref 0.1–1.6)
MID: 2 %
NEUTROS PCT: 88 %
Neutrophils Absolute: 7.5 10*3/uL — ABNORMAL HIGH (ref 1.4–7.0)
Platelets: 238 10*3/uL (ref 150–379)
RBC: 4.48 x10E6/uL (ref 3.77–5.28)
RDW: 12.7 % (ref 12.3–15.4)
WBC: 8.6 10*3/uL (ref 3.4–10.8)

## 2015-05-08 LAB — MICROSCOPIC EXAMINATION

## 2015-05-08 MED ORDER — TRAZODONE HCL 100 MG PO TABS
100.0000 mg | ORAL_TABLET | Freq: Every evening | ORAL | Status: DC | PRN
Start: 2015-05-08 — End: 2015-10-06

## 2015-05-08 MED ORDER — PROAIR HFA 108 (90 BASE) MCG/ACT IN AERS: INHALATION_SPRAY | RESPIRATORY_TRACT | Status: DC

## 2015-05-08 NOTE — Assessment & Plan Note (Signed)
Under better control on current regimen. Continue current regimen. Continue to monitor. Will increase trazodone for sleep. Rx sent to her pharmacy.

## 2015-05-08 NOTE — Progress Notes (Signed)
BP 151/99 mmHg  Pulse 108  Temp(Src) 99 F (37.2 C)  Ht 5\' 5"  (1.651 m)  Wt 251 lb 12.8 oz (114.216 kg)  BMI 41.90 kg/m2  SpO2 97%   Subjective:    Patient ID: Shirley Rogers, female    DOB: 1965/09/04, 49 y.o.   MRN: [id number]  HPI: Shirley Rogers is a 49 y.o. female  Chief Complaint  Patient presents with  . Follow-up    Depression is better.  . Flank Pain    Right Side  . Other    Thyroid biopsy done Tuesday, feels like food stuck in throat   DEPRESSION Mood status: better Satisfied with current treatment?: yes Symptom severity: mild  Duration of current treatment : 2 months Side effects: no Medication compliance: excellent compliance Psychotherapy/counseling: yes current Depressed mood: yes Anxious mood: yes Anhedonia: no Significant weight loss or gain: no Insomnia: yes hard to fall asleep Fatigue: yes Feelings of worthlessness or guilt: no Impaired concentration/indecisiveness: yes Suicidal ideations: no Hopelessness: no Crying spells: no Depression screen [facility] 2/9 05/08/2015 04/10/2015 03/30/2015  Decreased Interest 1 0 1  Down, Depressed, Hopeless 0 1 2  PHQ - 2 Score 1 1 3   Altered sleeping - 3 3  Tired, decreased energy - 1 2  Change in appetite - 0 2  Feeling bad or failure about yourself  - 1 1  Trouble concentrating - 2 2  Moving slowly or fidgety/restless - 1 1  Suicidal thoughts - 0 0  PHQ-9 Score - 9 14  Difficult doing work/chores - Somewhat difficult Very difficult   Has been coughing up blood for about a week- was done before the biopsy  ABDOMINAL PAIN- felt like she might have cracked a rib. Went to [place] and they were treating her as if she has pleurisy. Rib cage feels bruised. Antibiotics are not doing anything. Prednisone didn't do anything  Duration: Last Wednesday Onset: sudden Severity: severe Quality: sharp and stabbing Location:  RUQ  Episode duration: constant for the past 3 days Radiation: yes into her  back Frequency: constant Alleviating factors: nothing Aggravating factors: eating Status: worse Treatments attempted: antacids Fever: no but having hot flashes Nausea: yes Vomiting: yes Weight loss: no Decreased appetite: yes Diarrhea: yes Constipation: yes Blood in stool: no Heartburn: yes-severe Jaundice: no Rash: no Dysuria/urinary frequency: no Hematuria: no History of sexually transmitted disease: no Recurrent NSAID use: no  Relevant past medical, surgical, family and social history reviewed and updated as indicated. Interim medical history since our last visit reviewed. Allergies and medications reviewed and updated.  Review of Systems  Constitutional: Negative.   Gastrointestinal: Positive for abdominal pain. Negative for nausea, vomiting, diarrhea, constipation, blood in stool, abdominal distention, anal bleeding and rectal pain.    Per HPI unless specifically indicated above     Objective:    BP 151/99 mmHg  Pulse 108  Temp(Src) 99 F (37.2 C)  Ht 5\' 5"  (1.651 m)  Wt 251 lb 12.8 oz (114.216 kg)  BMI 41.90 kg/m2  SpO2 97%  Wt Readings from Last 3 Encounters:  05/08/15 251 lb 12.8 oz (114.216 kg)  04/10/15 250 lb (113.399 kg)  03/30/15 245 lb (111.131 kg)    Physical Exam  Constitutional: She is oriented to person, place, and time. She appears well-developed and well-nourished. No distress.  HENT:  Head: Normocephalic and atraumatic.  Right Ear: Hearing normal.  Left Ear: Hearing normal.  Nose: Nose normal.  Eyes: Conjunctivae and lids are normal. Right  eye exhibits no discharge. Left eye exhibits no discharge. No scleral icterus.  Cardiovascular: Normal rate, regular rhythm, normal heart sounds and intact distal pulses.  Exam reveals no gallop and no friction rub.   No murmur heard. Pulmonary/Chest: Effort normal and breath sounds normal. No respiratory distress. She has no wheezes. She has no rales. She exhibits no tenderness.  Abdominal: Soft.  Bowel sounds are normal. She exhibits no distension and no mass. There is tenderness. There is no rebound and no guarding.  Musculoskeletal: Normal range of motion.  Neurological: She is alert and oriented to person, place, and time.  Skin: Skin is warm, dry and intact. No rash noted. She is not diaphoretic. No erythema. No pallor.  Psychiatric: She has a normal mood and affect. Her speech is normal and behavior is normal. Judgment and thought content normal. Cognition and memory are normal.  Nursing note and vitals reviewed.   Results for orders placed or performed in visit on 05/08/15  Microscopic Examination  Result Value Ref Range   WBC, UA 0-5 0 -  5 /hpf   RBC, UA 0-2 0 -  2 /hpf   Epithelial Cells (non renal) 0-10 0 - 10 /hpf   Bacteria, UA Few None seen/Few  UA/M w/rflx Culture, Routine  Result Value Ref Range   Specific Gravity, UA 1.015 1.005 - 1.030   pH, UA 7.0 5.0 - 7.5   Color, UA Yellow Yellow   Appearance Ur Clear Clear   Leukocytes, UA 1+ (A) Negative   Protein, UA Negative Negative/Trace   Glucose, UA Negative Negative   Ketones, UA Negative Negative   RBC, UA Negative Negative   Bilirubin, UA Negative Negative   Urobilinogen, Ur 0.2 0.2 - 1.0 mg/dL   Nitrite, UA Negative Negative   Microscopic Examination See below:    Urinalysis Reflex Comment   CBC With Differential/Platelet  Result Value Ref Range   WBC 8.6 3.4 - 10.8 x10E3/uL   RBC 4.48 3.77 - 5.28 x10E6/uL   Hemoglobin 14.2 11.1 - 15.9 g/dL   Hematocrit 40.5 34.0 - 46.6 %   MCV 90 79 - 97 fL   MCH 31.7 26.6 - 33.0 pg   MCHC 35.1 31.5 - 35.7 g/dL   RDW 12.7 12.3 - 15.4 %   Platelets 238 150 - 379 x10E3/uL   Neutrophils 88 %   Lymphs 10 %   MID 2 %   Neutrophils Absolute 7.5 (H) 1.4 - 7.0 x10E3/uL   Lymphocytes Absolute 0.9 0.7 - 3.1 x10E3/uL   MID (Absolute) 0.2 0.1 - 1.6 X10E3/uL      Assessment & Plan:   Problem List Items Addressed This Visit      Other   Depression    Under better  control on current regimen. Continue current regimen. Continue to monitor. Will increase trazodone for sleep. Rx sent to her pharmacy.       Relevant Medications   traZODone (DESYREL) 100 MG tablet    Other Visit Diagnoses    RUQ pain    -  Primary    Significant concern for gall stones given symptoms. Will check CBC, CMP and RUQ ultrasound. Await results. Check up by phone next week.     Relevant Orders    Comprehensive metabolic panel    CBC With Differential/Platelet (Completed)    US Abdomen Limited RUQ    History of cystitis        UA checked today- trace leuks only. Will await culture and treat  as needed.     Relevant Orders    UA/M w/rflx Culture, Routine (Completed)    Microscopic Examination (Completed)        Follow up plan: Return if symptoms worsen or fail to improve.

## 2015-05-09 LAB — COMPREHENSIVE METABOLIC PANEL
A/G RATIO: 2 (ref 1.1–2.5)
ALT: 12 IU/L (ref 0–32)
AST: 14 IU/L (ref 0–40)
Albumin: 4.5 g/dL (ref 3.5–5.5)
Alkaline Phosphatase: 67 IU/L (ref 39–117)
BUN/Creatinine Ratio: 28 — ABNORMAL HIGH (ref 9–23)
BUN: 22 mg/dL (ref 6–24)
Bilirubin Total: 0.3 mg/dL (ref 0.0–1.2)
CALCIUM: 9.6 mg/dL (ref 8.7–10.2)
CO2: 28 mmol/L (ref 18–29)
CREATININE: 0.78 mg/dL (ref 0.57–1.00)
Chloride: 96 mmol/L — ABNORMAL LOW (ref 97–106)
GFR, EST AFRICAN AMERICAN: 102 mL/min/{1.73_m2} (ref >59)
GFR, EST NON AFRICAN AMERICAN: 88 mL/min/{1.73_m2} (ref >59)
GLUCOSE: 153 mg/dL — AB (ref 65–99)
Globulin, Total: 2.3 g/dL (ref 1.5–4.5)
Potassium: 4.8 mmol/L (ref 3.5–5.2)
Sodium: 140 mmol/L (ref 136–144)
TOTAL PROTEIN: 6.8 g/dL (ref 6.0–8.5)

## 2015-05-10 LAB — UA/M W/RFLX CULTURE, ROUTINE: Organism ID, Bacteria: NO GROWTH

## 2015-05-11 ENCOUNTER — Telehealth: Payer: Self-pay | Admitting: Family Medicine

## 2015-05-11 ENCOUNTER — Encounter: Payer: Self-pay | Admitting: Physical Therapy

## 2015-05-11 ENCOUNTER — Ambulatory Visit: Payer: BC Managed Care – PPO | Admitting: Physical Therapy

## 2015-05-11 DIAGNOSIS — R42 Dizziness and giddiness: Secondary | ICD-10-CM | POA: Diagnosis not present

## 2015-05-11 DIAGNOSIS — R269 Unspecified abnormalities of gait and mobility: Secondary | ICD-10-CM

## 2015-05-11 NOTE — Telephone Encounter (Signed)
Patient notified

## 2015-05-11 NOTE — Therapy (Addendum)
[place] MAIN [facility] SERVICES [address] [place], [place], [address] Phone: [phone]   Fax:  [phone]  Physical Therapy Treatment  Patient Details  Name: Shirley Rogers MRN: [id number] Date of Birth: [birth year] Referring Provider: Carloyn Manner  Encounter Date: 05/11/2015    Past Medical History  Diagnosis Date  . Barrett esophagus   . Diverticulosis   . Concussion     1974,1982,1987,2016,2016  . 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 Family history of adverse reaction to anesthesia     father - slow to wake  . Hypertension     no meds currently.  . Stroke ([place])     2000 - no deficits  . Hemiplegic migraine     daily  . Migraine   . GERD (gastroesophageal reflux disease)   . Vertigo     from concussive disorder.  seeing neuro 03/23/15  . Sleep apnea     CPAP machine broken  . Vascular spasm ([place])   . Complicated migraine     with facial drooping  . DDD (degenerative disc disease), lumbar     MRI 2008, L3-4, L5-S1, spondylotic changes, multilevel facet joint hypertrophic changes  . Recurrent sinusitis   . Recurrent UTI     question about IC in the past  . Depression     on paxil, lexapro, cymbalta in the past  . Anxiety     on xanax in the past  . IBS (irritable bowel syndrome)   . Fibrocystic breast disease   . Hyperlipidemia   . Colon polyp   . Hemorrhoids   . ASCVD (arteriosclerotic cardiovascular disease)   . Insomnia chronic  . Fatty liver 2013    On CT abdomen/pelvis  . Kidney stone 2013    on CT abdomen/pelvis  . Umbilical hernia 0000000    on CT abdomen/pelvis  . Normal cardiac stress test 2009    EF 66%  . Narrowing of intervertebral disc space 2011    C5-6  . Carotid atherosclerosis 2003    on ultrasound    Past Surgical History  Procedure Laterality Date  . Tubal ligation    . Breast surgery Right     atypical  hyperplasia  . Colonoscopy w/ biopsies      Removed 5 polyps  . Biopsy punch thyroid      There were no vitals filed for this visit.  Visit Diagnosis:  No diagnosis found.      Subjective Assessment - 05/11/15 1007    Subjective Pt states her headaches are occuring less frequently but same intensity. Pt states she is doing better and her energy level is better. Pt states she has been trying to go back to work for short periods of time. Pt reports she has a headache. Pt states she had a bad day yesterday where she was stumbling and states she had a sinus pressure headache but states other than that she has done very well.       Neuromuscular Re-education: Discussed having patient try to gradually reintegrate back into working. Pt has been going in for gradually increasing periods of time to work. Pt states that she is fatigued after working but states otherwise it is going well.   Walking while scanning for visual targets: Performed ambulation at self-selected speed and at increased pace in busy visual environment (medical mall) while scanning for visual targets in hallway  as called out by therapist. Pt ambulated over 1000'.  Stairs: Pt ambulated up/down one flight of stairs without rails with step over step gait pattern with and without dual tasks such as naming and alternate hand clapping on top and under hand. Pt reports 4/10 dizziness with this activity.  Pt required sitting rest break after the stair and ambulation in the medical mall activities. Pt declined to do further activities this date secondary to pt report of headache.  Pt reports majority of activities this date caused her to feel anxious and dizziness level was 4/10.       PT Long Term Goals - 04/03/15 1031    PT LONG TERM GOAL #1   Title Pt will be independent with HEP for symptom management in order to improve function at home and return to work by 05/15/15   Status New   PT LONG TERM GOAL #2   Title Pt will  improve ABC by 13% to demonstrate clinically significant improvement in balance confidence and decreased risk for falls by 05/15/15   Baseline 04/03/15: 40%   Status New   PT LONG TERM GOAL #3   Title Pt will decrease DHI by 18 points in order to demonstrate clinically significant decrease in severity of symptoms by 05/15/15   Baseline 04/03/15: 88/100   Status New   PT LONG TERM GOAL #4   Title Pt will improve DGI by 3 points in order to demonstrate ability to perform head turns with ambulation with less disruption to gait by 05/15/15   Baseline 04/03/15: 10/24   Status New   PT LONG TERM GOAL #5   Title Pt will improve foam eyes closed balance to >10 seconds in order to improve balance on compliant surfaces (grass) in low light situations by 05/15/15   Baseline 04/03/15: 3.4 seconds   Status New   Additional Long Term Goals   Additional Long Term Goals Yes   PT LONG TERM GOAL #6   Title Pt will increase gait speed to at least 0.8 m/s in order to resume full function at home and partial function with community ambulation by 05/15/15   Baseline 04/03/15: 0.36 m/s   Status New            Plan - 05/14/15 0956    Clinical Impression Statement Pt has started to try return to work in limited exposures of a few hours at a time which has been going well except 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 Pt did well with ambulaiton on stairs and with ambulating in busy visual environment with dual tasks however this was challenging for patient and she reported incrased in er symptoms with attempts.    Pt will benefit from skilled therapeutic intervention in order to improve on the following deficits Abnormal gait;Decreased balance;Dizziness   Rehab Potential Good   Clinical Impairments Affecting Rehab Potential Positive: motivation, age; Negative: chronicity, repeated concussions   PT Frequency 2x / week   PT Duration 6 weeks   PT Treatment/Interventions  ADLs/Self Care Home Management;Canalith Repostioning;DME Instruction;Gait training;Stair training;Functional mobility training;Therapeutic activities;Therapeutic exercise;Balance training;Neuromuscular re-education;Patient/family education;Manual techniques;Vestibular   PT Next Visit Plan Continue working on busy visual environments while doing dual activities. Consider working on quick turns activities and remembered targets. Progress HEP as able.    PT Home Exercise Plan NBOS with slow horizontal head turns 3 x 30 seconds on pillow, 4 times/day, VOR x 2 horizontal 60 seconds x 2 reps/vertical  x 2 reps twice/day NBOS with conflicting background, gait in hallway with head turns to view cards on wall, remembered targets   Consulted and Agree with Plan of Care Patient         PT End of Session - 05/14/15 0958    Visit Number 7   Number of Visits 10   Date for PT Re-Evaluation 05/15/15   Authorization Type no g codes   PT Start Time 1004   PT Stop Time 1045   PT Time Calculation (min) 41 min   Equipment Utilized During Treatment Gait belt   Activity Tolerance Treatment limited secondary to agitation   Behavior During Therapy [facility] for tasks assessed/performed              Problem List Patient Active Problem List   Diagnosis Date Noted  . 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 Globus sensation 03/30/2015  . Post-concussion syndrome 03/13/2015  . Depression 03/13/2015  . Hypertension   . Barrett esophagus   . Diverticulosis   . Hemiplegic migraine   . Lupus ([place])   . Stroke [facility])    Lady Deutscher PT, DPT Kyia Rhude 05/11/2015, 10:38 AM  [place] MAIN [facility] SERVICES [address] [place], [place], [address] Phone: [phone]   Fax:  [phone]  Name: Shirley Rogers MRN: [id number] Date of Birth: 05/14/1966

## 2015-05-11 NOTE — Telephone Encounter (Signed)
Please let her know that her labs came back normal. No growth on the urine. We'll wait on the ultrasound. Thanks!

## 2015-05-12 ENCOUNTER — Ambulatory Visit: Payer: BC Managed Care – PPO | Admitting: Physical Therapy

## 2015-05-12 ENCOUNTER — Telehealth: Payer: Self-pay | Admitting: Family Medicine

## 2015-05-12 ENCOUNTER — Ambulatory Visit
Admission: RE | Admit: 2015-05-12 | Discharge: 2015-05-12 | Disposition: A | Discharge status: Home / Self Care | Payer: BC Managed Care – PPO | Source: Ambulatory Visit | Attending: Family Medicine | Admitting: Family Medicine

## 2015-05-12 DIAGNOSIS — R1011 Right upper quadrant pain: Secondary | ICD-10-CM | POA: Diagnosis not present

## 2015-05-12 DIAGNOSIS — K760 Fatty (change of) liver, not elsewhere classified: Secondary | ICD-10-CM | POA: Insufficient documentation

## 2015-05-12 DIAGNOSIS — K839 Disease of biliary tract, unspecified: Secondary | ICD-10-CM | POA: Insufficient documentation

## 2015-05-12 NOTE — Telephone Encounter (Signed)
Dr. Wynetta Emery please let patient know that she is scheduled at [facility] January 19,2017 @ 2:00 Tammi Klippel., when you call her this evening

## 2015-05-12 NOTE — Telephone Encounter (Signed)
Spoke to patient and gave her results and appointment. Will check CT abdomen due to pain. Order put in today. Await results.

## 2015-05-12 NOTE — Telephone Encounter (Signed)
We can get the 2017 appointment and wait on the MRCP right this minute. Thanks. I'll call her this PM to give her the results.

## 2015-05-12 NOTE — Telephone Encounter (Signed)
Spoke with [facility], they are not able to order the MRCP "only", I am not sure why they think that I just wanted that ordered. They said that their new patient appointments are booking into 2017, and that we can order it and then send the results to them. What would you like for me to do, would you like for me to go ahead and get her scheduled as non-urgent in 2017 or  Go ahead and the scan?

## 2015-05-14 ENCOUNTER — Ambulatory Visit (INDEPENDENT_AMBULATORY_CARE_PROVIDER_SITE_OTHER): Payer: BC Managed Care – PPO | Admitting: Diagnostic Neuroimaging

## 2015-05-14 ENCOUNTER — Ambulatory Visit: Payer: BC Managed Care – PPO | Admitting: Physical Therapy

## 2015-05-14 ENCOUNTER — Encounter: Payer: Self-pay | Admitting: Diagnostic Neuroimaging

## 2015-05-14 ENCOUNTER — Encounter: Payer: Self-pay | Admitting: Physical Therapy

## 2015-05-14 VITALS — BP 152/97 | HR 101 | Wt 250.0 lb

## 2015-05-14 DIAGNOSIS — G4733 Obstructive sleep apnea (adult) (pediatric): Secondary | ICD-10-CM | POA: Diagnosis not present

## 2015-05-14 DIAGNOSIS — F0781 Postconcussional syndrome: Secondary | ICD-10-CM | POA: Diagnosis not present

## 2015-05-14 DIAGNOSIS — R42 Dizziness and giddiness: Secondary | ICD-10-CM

## 2015-05-14 DIAGNOSIS — R269 Unspecified abnormalities of gait and mobility: Secondary | ICD-10-CM

## 2015-05-14 MED ORDER — VERAPAMIL HCL 40 MG PO TABS: 40.0000 mg | ORAL_TABLET | Freq: Two times a day (BID) | ORAL | Status: DC

## 2015-05-14 NOTE — Therapy (Signed)
[place] MAIN [facility] SERVICES [address] [place], [place], [address] Phone: [phone]   Fax:  [phone]  Physical Therapy Treatment  Patient Details  Name: Shirley Rogers MRN: [id number] Date of Birth: 1966/03/08 Referring Provider: Carloyn Manner  Encounter Date: 05/14/2015      PT End of Session - 05/14/15 1323    Visit Number 8   Number of Visits 10   Date for PT Re-Evaluation 05/15/15   Authorization Type no g codes   PT Start Time [id number]   PT Stop Time 1130   PT Time Calculation (min) 46 min   Equipment Utilized During Treatment Gait belt   Activity Tolerance Treatment limited secondary to agitation   Behavior During Therapy [facility] for tasks assessed/performed      Past Medical History  Diagnosis Date  . Barrett esophagus   . Diverticulosis   . Concussion     1974,1982,1987,2016,2016  . 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 Family history of adverse reaction to anesthesia     father - slow to wake  . Hypertension     no meds currently.  . Stroke ([place])     2000 - no deficits  . Hemiplegic migraine     daily  . Migraine   . GERD (gastroesophageal reflux disease)   . Vertigo     from concussive disorder.  seeing neuro 03/23/15  . Sleep apnea     CPAP machine broken  . Vascular spasm ([place])   . Complicated migraine     with facial drooping  . DDD (degenerative disc disease), lumbar     MRI 2008, L3-4, L5-S1, spondylotic changes, multilevel facet joint hypertrophic changes  . Recurrent sinusitis   . Recurrent UTI     question about IC in the past  . Depression     on paxil, lexapro, cymbalta in the past  . Anxiety     on xanax in the past  . IBS (irritable bowel syndrome)   . Fibrocystic breast disease   . Hyperlipidemia   . Colon polyp   . Hemorrhoids   . ASCVD (arteriosclerotic cardiovascular disease)   . Insomnia chronic  .  Fatty liver 2013    On CT abdomen/pelvis  . Kidney stone 2013    on CT abdomen/pelvis  . Umbilical hernia 0000000    on CT abdomen/pelvis  . Normal cardiac stress test 2009    EF 66%  . Narrowing of intervertebral disc space 2011    C5-6  . Carotid atherosclerosis 2003    on ultrasound    Past Surgical History  Procedure Laterality Date  . Tubal ligation    . Breast surgery Right     atypical hyperplasia  . Colonoscopy w/ biopsies      Removed 5 polyps  . Biopsy punch thyroid      There were no vitals filed for this visit.  Visit Diagnosis:  Dizziness and giddiness  Abnormality of gait      Subjective Assessment - 05/14/15 1046    Subjective Pt states that she has a headache today. Pt seeing her neurologist today and has a follow-up appointment with Dr. Pryor Ochoa next Thursday. Pt states she has been trying to go to work for short periods of time, get out in the community more and has been doing her HEP. Pt states she is "starting to feel normal again" but is  concerned about how she will do when she returns to work full time. Pt expresses that she is eager to be able to return to working full time again.      Neuromuscular Re-education:  Discussed how patient has been doing with gradual increase in activity level and intensity of activities and pt reports she has been doing well except reports earlier this week on Tuesday she had therapy session, went to [place] and went to school for many hours and towards the end of the day she began to have headache, dizziness and fatigue. Pt states she spent much of yesterday recuperating and resting. Discussed gaging of intensity of activities and pacing.  Worked on forwards ambulation with random head turns on level and ramp/incline surface in busy visual environment while doing naming tasks/dual tasks with SBA. Pt ambulated greater than 1000' without LOB or veering and was able to maintain gait speed.  Pt ambulated up/down 1 flight of  steps times 2 reps while doing alternate cone tapping with UEs and while doing naming tasks. Pt was able to ascend/descend without holding railing without LOB with good pace.   Worked on cone obstacle course whereby patient would do 1/4 and full quick turns L and R with sunglasses donned to mimic low light environement.  On Airex pad with feet together, worked on letter finding from random letters on mirrors (busy visual background) with sunglasses donned to mimic low light environement.  Then repeated activity on 1/2 foam roll with flat side down and then while sidestepping L/R while scanning for letters with sunglasses donned to mimic low light environement.          PT Education - 05/14/15 1321    Education provided Yes   Education Details Reinforced HEP; discussed adding decreased lighting as a progress for HEP. Discussed gradual reintroduction to prior activities and pacing with return to work.   Person(s) Educated Patient   Methods Explanation   Comprehension Verbalized understanding             PT Long Term Goals - 04/03/15 1031    PT LONG TERM GOAL #1   Title Pt will be independent with HEP for symptom management in order to improve function at home and return to work by 05/15/15   Status New   PT LONG TERM GOAL #2   Title Pt will improve ABC by 13% to demonstrate clinically significant improvement in balance confidence and decreased risk for falls by 05/15/15   Baseline 04/03/15: 40%   Status New   PT LONG TERM GOAL #3   Title Pt will decrease DHI by 18 points in order to demonstrate clinically significant decrease in severity of symptoms by 05/15/15   Baseline 04/03/15: 88/100   Status New   PT LONG TERM GOAL #4   Title Pt will improve DGI by 3 points in order to demonstrate ability to perform head turns with ambulation with less disruption to gait by 05/15/15   Baseline 04/03/15: 10/24   Status New   PT LONG TERM GOAL #5   Title Pt will improve foam eyes closed  balance to >10 seconds in order to improve balance on compliant surfaces (grass) in low light situations by 05/15/15   Baseline 04/03/15: 3.4 seconds   Status New   Additional Long Term Goals   Additional Long Term Goals Yes   PT LONG TERM GOAL #6   Title Pt will increase gait speed to at least 0.8 m/s in order to resume full  function at home and partial function with community ambulation by 05/15/15   Baseline 04/03/15: 0.36 m/s   Status New               Plan - 05/14/15 1324    Clinical Impression Statement Pt is progressing well with therapy and was able to do ambulate in busy visual environment while doing random head turns and dual task of naming this date. Pt was able to maintain walking speed and ambulated without veering. In addition, pt reported 2/10 dizziness during session. Pt has been gradually increasing her activity level and going to work for short periods of time working on building up the time as able. Pt encouraged to follow up as scheduled.     Pt will benefit from skilled therapeutic intervention in order to improve on the following deficits Abnormal gait;Decreased balance;Dizziness   Rehab Potential Good   Clinical Impairments Affecting Rehab Potential Positive: motivation, age; Negative: chronicity, repeated concussions   PT Frequency 2x / week   PT Duration 6 weeks   PT Treatment/Interventions ADLs/Self Care Home Management;Canalith Repostioning;DME Instruction;Gait training;Stair training;Functional mobility training;Therapeutic activities;Therapeutic exercise;Balance training;Neuromuscular re-education;Patient/family education;Manual techniques;Vestibular   PT Next Visit Plan Continue working on progressions of vestibular exercises.    PT Home Exercise Plan NBOS with slow horizontal head turns 3 x 30 seconds on pillow, 4 times/day, VOR x 2 horizontal 60 seconds x 2 reps/vertical x 2 reps twice/day NBOS with conflicting background, gait in hallway with head turns to  view cards on wall, remembered targets   Consulted and Agree with Plan of Care Patient        Problem List Patient Active Problem List   Diagnosis Date Noted  . 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 Globus sensation 03/30/2015  . Post-concussion syndrome 03/13/2015  . Depression 03/13/2015  . Hypertension   . Barrett esophagus   . Diverticulosis   . Hemiplegic migraine   . Lupus ([place])   . Stroke [facility])    Lady Deutscher PT, DPT Lady Deutscher 05/14/2015, 1:29 PM  [place] MAIN [facility] SERVICES [address] [place], [place], [address] Phone: [phone]   Fax:  [phone]  Name: Priscila Tercero MRN: [id number] Date of Birth: [birth year]

## 2015-05-14 NOTE — Patient Instructions (Signed)
  Trial of verapamil 40mg  twice a day.  I will refer you to sleep clinic / sleep study.
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
## 2015-05-18 ENCOUNTER — Other Ambulatory Visit: Payer: Self-pay | Admitting: Gastroenterology

## 2015-05-18 DIAGNOSIS — R198 Other specified symptoms and signs involving the digestive system and abdomen: Secondary | ICD-10-CM

## 2015-05-18 DIAGNOSIS — R1084 Generalized abdominal pain: Secondary | ICD-10-CM

## 2015-05-18 DIAGNOSIS — R197 Diarrhea, unspecified: Secondary | ICD-10-CM

## 2015-05-19 ENCOUNTER — Ambulatory Visit: Payer: BC Managed Care – PPO | Admitting: Physical Therapy

## 2015-05-19 ENCOUNTER — Encounter: Payer: Self-pay | Admitting: Physical Therapy

## 2015-05-19 DIAGNOSIS — R269 Unspecified abnormalities of gait and mobility: Secondary | ICD-10-CM

## 2015-05-19 DIAGNOSIS — R42 Dizziness and giddiness: Secondary | ICD-10-CM | POA: Diagnosis not present

## 2015-05-19 NOTE — Therapy (Signed)
[place] MAIN [facility] SERVICES [address] [place], [place], [address] Phone: [phone]   Fax:  [phone]  Physical Therapy Treatment/Discharge Summary  Patient Details  Name: Shirley Rogers MRN: [id number] Date of Birth: 24-Sep-1965 Referring Provider: Carloyn Manner  Encounter Date: 05/19/2015      PT End of Session - 05/20/15 1130    Visit Number 9   Number of Visits 10   Date for PT Re-Evaluation 05/15/15   Authorization Type no g codes   PT Start Time 0915   PT Stop Time 1000   PT Time Calculation (min) 45 min   Activity Tolerance Treatment limited secondary to agitation   Behavior During Therapy [facility] for tasks assessed/performed      Past Medical History  Diagnosis Date  . Barrett esophagus   . Diverticulosis   . Concussion     1974,1982,1987,2016,2016  . 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 Family history of adverse reaction to anesthesia     father - slow to wake  . Hypertension     no meds currently.  . Stroke ([place])     1998-08-10 - no deficits  . Hemiplegic migraine     daily  . Migraine   . GERD (gastroesophageal reflux disease)   . Vertigo     from concussive disorder.  seeing neuro 03/23/15  . Sleep apnea     CPAP machine broken  . Vascular spasm ([place])   . Complicated migraine     with facial drooping  . DDD (degenerative disc disease), lumbar     MRI August 10, 2006, L3-4, L5-S1, spondylotic changes, multilevel facet joint hypertrophic changes  . Recurrent sinusitis   . Recurrent UTI     question about IC in the past  . Depression     on paxil, lexapro, cymbalta in the past  . Anxiety     on xanax in the past  . IBS (irritable bowel syndrome)   . Fibrocystic breast disease   . Hyperlipidemia   . Colon polyp   . Hemorrhoids   . ASCVD (arteriosclerotic cardiovascular disease)   . Insomnia chronic  . Fatty liver August 11, 2011    On CT  abdomen/pelvis  . Kidney stone 08/11/2011    on CT abdomen/pelvis  . Umbilical hernia 3094    on CT abdomen/pelvis  . Normal cardiac stress test 2007-08-11    EF 66%  . Narrowing of intervertebral disc space Aug 10, 2009    C5-6  . Carotid atherosclerosis 08-10-01    on ultrasound    Past Surgical History  Procedure Laterality Date  . Tubal ligation    . Breast surgery Right     atypical hyperplasia  . Colonoscopy w/ biopsies      Removed 5 polyps  . Biopsy punch thyroid      There were no vitals filed for this visit.  Visit Diagnosis:  Dizziness and giddiness  Abnormality of gait      Subjective Assessment - 05/19/15 0920    Subjective Pt states that she had a neurology appointment last week and states it went well. Pt states they discussed her gradual return to work. Pt states she is going to Dr. Darien Ramus office on Thursday. Pt states she has been doing her HEP daily and states that her symptoms are much improved. Pt plans on returning to school after the winter break. Pt is in agreement with discharge  from PT services at this time.         Physical Performance: Performed DGI, ABC scale DHI and 10 meter walking speed functional outcome measures. Discussed test results and compared to prior testing. Discussed progress towards goals and discharge plans. Discussed community based balance and exercise programs including Marketing executive. Reviewed patient's HEP and discussed progressions. Discussed strategies for return to work and pacing of activity level.    FUNCTIONAL OUTCOME MEASURES:  Results Comments  DHI 88 severe 14 low perception of handicap  ABC Scale 40% 85%  DGI 10/24 24/24  10 meter Walking Speed 0.36 m/sec 1.11 m/sec   Pt is able to stand for 30 seconds with feet together on foam with EC with +1 sway.        PT Education - 05/20/15 1129    Education provided Yes   Education Details Reviewed HEP; discussed strategies for return to work and gradual  reintroduction to the work environment. Discussed community balance and exercise classes.    Person(s) Educated Patient   Methods Explanation   Comprehension Verbalized understanding             PT Long Term Goals - 05/20/15 1131    PT LONG TERM GOAL #1   Status Achieved   PT LONG TERM GOAL #2   Title Pt will improve ABC by 13% to demonstrate clinically significant improvement in balance confidence and decreased risk for falls by 05/15/15   Baseline 04/03/15: 40%   Status Achieved   PT LONG TERM GOAL #3   Title Pt will decrease DHI by 18 points in order to demonstrate clinically significant decrease in severity of symptoms by 05/15/15   Baseline 04/03/15: 88/100   Status Achieved   PT LONG TERM GOAL #4   Title Pt will improve DGI by 3 points in order to demonstrate ability to perform head turns with ambulation with less disruption to gait by 05/15/15   Baseline 04/03/15: 10/24   Status Achieved   PT LONG TERM GOAL #5   Title Pt will improve foam eyes closed balance to >10 seconds in order to improve balance on compliant surfaces (grass) in low light situations by 05/15/15   Baseline 04/03/15: 3.4 seconds   Status Achieved   PT LONG TERM GOAL #6   Title Pt will increase gait speed to at least 0.8 m/s in order to resume full function at home and partial function with community ambulation by 05/15/15   Baseline 04/03/15: 0.36 m/s   Status Achieved               Plan - 05/20/15 1132    Clinical Impression Statement Pt has demonstrated consistent improvement with therapy and she has met all of her goals as set on POC. Pt improved form a 40% to 85% on the ABC scale which indicates decreased falls risk. Pt improved from an 65 which is severe perception of handicap on the [facility] to a 14 which is low perception of handicap. Pt improved from a 10/24 on the DGI to a 24/24. Pt 's 10 meter walking speed improved from 0.36 m/sec to 1/11 m/sec and pt is able to stand for 30 seconds with feet  togehter on foam with EC. Pt has already begun to return to work for short periods of time and discussed strategies for when she reutrns to work and to gradually increase her time at work. Pt is independent in with her HEP and is knowledgable of progressions for  the exercises.    Pt will benefit from skilled therapeutic intervention in order to improve on the following deficits Abnormal gait;Decreased balance;Dizziness   Rehab Potential Good   Clinical Impairments Affecting Rehab Potential Positive: motivation, age; Negative: chronicity, repeated concussions   PT Frequency 2x / week   PT Duration 6 weeks   PT Treatment/Interventions ADLs/Self Care Home Management;Canalith Repostioning;DME Instruction;Gait training;Stair training;Functional mobility training;Therapeutic activities;Therapeutic exercise;Balance training;Neuromuscular re-education;Patient/family education;Manual techniques;Vestibular   PT Home Exercise Plan NBOS with slow horizontal head turns 3 x 30 seconds on pillow, 4 times/day, VOR x 2 horizontal 60 seconds x 2 reps/vertical x 2 reps twice/day NBOS with conflicting background, gait in hallway with head turns to view cards on wall, remembered targets   Consulted and Agree with Plan of Care Patient        Problem List Patient Active Problem List   Diagnosis Date Noted  . 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 Globus sensation 03/30/2015  . Post-concussion syndrome 03/13/2015  . Depression 03/13/2015  . Hypertension   . Barrett esophagus   . Diverticulosis   . Hemiplegic migraine   . Lupus ([place])   . Stroke [facility])    Lady Deutscher PT, DPT Dameisha Tschida 05/20/2015, 11:36 AM  [place] MAIN [facility] SERVICES [address] [place], [place], [address] Phone: [phone]   Fax:  [phone]  Name: Shirley Rogers MRN: [id number] Date of Birth: Jan 23, 1966

## 2015-05-21 ENCOUNTER — Ambulatory Visit: Payer: BC Managed Care – PPO | Admitting: Physical Therapy

## 2015-05-27 ENCOUNTER — Other Ambulatory Visit: Payer: Self-pay | Admitting: Gastroenterology

## 2015-05-27 ENCOUNTER — Ambulatory Visit (HOSPITAL_COMMUNITY)
Admission: RE | Admit: 2015-05-27 | Discharge: 2015-05-27 | Disposition: A | Discharge status: Home / Self Care | Payer: BC Managed Care – PPO | Source: Ambulatory Visit | Attending: Gastroenterology | Admitting: Gastroenterology

## 2015-05-27 DIAGNOSIS — R1084 Generalized abdominal pain: Secondary | ICD-10-CM

## 2015-05-27 DIAGNOSIS — R198 Other specified symptoms and signs involving the digestive system and abdomen: Secondary | ICD-10-CM

## 2015-05-27 DIAGNOSIS — N281 Cyst of kidney, acquired: Secondary | ICD-10-CM | POA: Insufficient documentation

## 2015-05-27 DIAGNOSIS — R197 Diarrhea, unspecified: Secondary | ICD-10-CM | POA: Insufficient documentation

## 2015-05-27 DIAGNOSIS — R140 Abdominal distension (gaseous): Secondary | ICD-10-CM | POA: Diagnosis not present

## 2015-05-27 DIAGNOSIS — K828 Other specified diseases of gallbladder: Secondary | ICD-10-CM | POA: Insufficient documentation

## 2015-05-27 DIAGNOSIS — K449 Diaphragmatic hernia without obstruction or gangrene: Secondary | ICD-10-CM | POA: Insufficient documentation

## 2015-05-27 MED ORDER — GADOBENATE DIMEGLUMINE 529 MG/ML IV SOLN
20.0000 mL | Freq: Once | INTRAVENOUS | Status: AC | PRN
  Administered 2015-05-27: 20 mL via INTRAVENOUS

## 2015-05-28 ENCOUNTER — Other Ambulatory Visit: Payer: Self-pay | Admitting: Family Medicine

## 2015-06-10 ENCOUNTER — Ambulatory Visit (INDEPENDENT_AMBULATORY_CARE_PROVIDER_SITE_OTHER): Payer: BC Managed Care – PPO | Admitting: Neurology

## 2015-06-10 ENCOUNTER — Encounter: Payer: Self-pay | Admitting: Neurology

## 2015-06-10 VITALS — BP 150/80 | HR 90 | Resp 20 | Ht 65.0 in | Wt 251.0 lb

## 2015-06-10 DIAGNOSIS — G4733 Obstructive sleep apnea (adult) (pediatric): Secondary | ICD-10-CM

## 2015-06-10 DIAGNOSIS — G4730 Sleep apnea, unspecified: Secondary | ICD-10-CM | POA: Insufficient documentation

## 2015-06-10 DIAGNOSIS — R0683 Snoring: Secondary | ICD-10-CM | POA: Diagnosis not present

## 2015-06-10 DIAGNOSIS — F0781 Postconcussional syndrome: Secondary | ICD-10-CM | POA: Diagnosis not present

## 2015-06-10 DIAGNOSIS — Z9989 Dependence on other enabling machines and devices: Principal | ICD-10-CM
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
## 2015-06-24 ENCOUNTER — Other Ambulatory Visit: Payer: Self-pay | Admitting: Family Medicine

## 2015-06-24 ENCOUNTER — Other Ambulatory Visit: Payer: Self-pay | Admitting: Podiatry

## 2015-06-25 ENCOUNTER — Other Ambulatory Visit: Payer: Self-pay | Admitting: Family Medicine

## 2015-07-02 ENCOUNTER — Other Ambulatory Visit: Payer: Self-pay

## 2015-07-02 ENCOUNTER — Encounter
Admission: RE | Admit: 2015-07-02 | Discharge: 2015-07-02 | Disposition: A | Discharge status: Home / Self Care | Payer: BC Managed Care – PPO | Source: Ambulatory Visit | Attending: Surgery | Admitting: Surgery

## 2015-07-02 DIAGNOSIS — K811 Chronic cholecystitis: Secondary | ICD-10-CM | POA: Diagnosis not present

## 2015-07-02 DIAGNOSIS — Z01812 Encounter for preprocedural laboratory examination: Secondary | ICD-10-CM | POA: Diagnosis present

## 2015-07-02 HISTORY — DX: Dizziness and giddiness: R42

## 2015-07-02 HISTORY — DX: Dizziness and giddiness: S09.90XS

## 2015-07-02 LAB — CBC
HEMATOCRIT: 40.1 % (ref 35.0–47.0)
HEMOGLOBIN: 13.7 g/dL (ref 12.0–16.0)
MCH: 29.8 pg (ref 26.0–34.0)
MCHC: 34.3 g/dL (ref 32.0–36.0)
MCV: 87.1 fL (ref 80.0–100.0)
Platelets: 217 10*3/uL (ref 150–440)
RBC: 4.6 MIL/uL (ref 3.80–5.20)
RDW: 12.9 % (ref 11.5–14.5)
WBC: 5 10*3/uL (ref 3.6–11.0)

## 2015-07-02 LAB — DIFFERENTIAL
Basophils Absolute: 0.1 10*3/uL (ref 0–0.1)
Basophils Relative: 1 %
Eosinophils Absolute: 0.1 10*3/uL (ref 0–0.7)
Eosinophils Relative: 2 %
LYMPHS ABS: 1.7 10*3/uL (ref 1.0–3.6)
LYMPHS PCT: 34 %
Monocytes Absolute: 0.4 10*3/uL (ref 0.2–0.9)
Monocytes Relative: 9 %
NEUTROS ABS: 2.7 10*3/uL (ref 1.4–6.5)
Neutrophils Relative %: 54 %

## 2015-07-02 LAB — PROTIME-INR
INR: 1.03
PROTHROMBIN TIME: 13.7 s (ref 11.4–15.0)

## 2015-07-02 LAB — PLATELET FUNCTION ASSAY
COLLAGEN / ADP: 86 s (ref 0–118)
Collagen / Epinephrine: 102 seconds (ref 0–193)

## 2015-07-02 LAB — APTT: APTT: 28 s (ref 24–36)

## 2015-07-02 NOTE — Patient Instructions (Signed)
  Your procedure is scheduled on: February 9. 2017 (Thursday) Report to Day Surgery.[facility]) Second Floor To find out your arrival time please call [phone] between 1PM - 3PM on July 08, 2015 (Wednesday).  Remember: Instructions that are not followed completely may result in serious medical risk, up to and including death, or upon the discretion of your surgeon and anesthesiologist your surgery may need to be rescheduled.    __x__ 1. Do not eat food or drink liquids after midnight. No gum chewing or hard candies.     __x__ 2. No Alcohol for 24 hours before or after surgery.   ____ 3. Bring all medications with you on the day of surgery if instructed.    _x___ 4. Notify your doctor if there is any change in your medical condition     (cold, fever, infections).     Do not wear jewelry, make-up, hairpins, clips or nail polish.  Do not wear lotions, powders, or perfumes. You may wear deodorant.  Do not shave 48 hours prior to surgery. Men may shave face and neck.  Do not bring valuables to the hospital.    [facility] is not responsible for any belongings or valuables.               Contacts, dentures or bridgework may not be worn into surgery.  Leave your suitcase in the car. After surgery it may be brought to your room.  For patients admitted to the hospital, discharge time is determined by your                treatment team.   Patients discharged the day of surgery will not be allowed to drive home.   Please read over the following fact sheets that you were given:   Surgical Site Infection Prevention   ____ Take these medicines the morning of surgery with A SIP OF WATER:    1. Omeprazole (Omeprazole at bedtime the night before surgery)  2. Mag- Oxide  3.   4.  5.  6.  ____ Fleet Enema (as directed)   __x__ Use CHG Soap as directed  __x_ Use inhalers on the day of surgery (Use Pro-Air inhaler the morning of surgery and bring to hospital )  ____ Stop metformin  2 days prior to surgery    ____ Take 1/2 of usual insulin dose the night before surgery and none on the morning of surgery.   ____ Stop Coumadin/Plavix/aspirin on   ____ Stop Anti-inflammatories on (STOP Naproxen , Diclofenac and all NSAIDS now) Tylenol ok take for pain if needed   ____ Stop supplements until after surgery.    ____ Bring C-Pap to the hospital. (C-PAP Broken)

## 2015-07-06 ENCOUNTER — Ambulatory Visit
Admission: EM | Admit: 2015-07-06 | Discharge: 2015-07-06 | Disposition: A | Discharge status: Home / Self Care | Payer: BC Managed Care – PPO | Attending: Family Medicine | Admitting: Family Medicine

## 2015-07-06 DIAGNOSIS — N390 Urinary tract infection, site not specified: Secondary | ICD-10-CM

## 2015-07-06 DIAGNOSIS — B349 Viral infection, unspecified: Secondary | ICD-10-CM

## 2015-07-06 LAB — CBC WITH DIFFERENTIAL/PLATELET
BASOS ABS: 0 10*3/uL (ref 0–0.1)
BASOS PCT: 1 %
EOS ABS: 0.1 10*3/uL (ref 0–0.7)
EOS PCT: 2 %
HCT: 39.9 % (ref 35.0–47.0)
Hemoglobin: 13.6 g/dL (ref 12.0–16.0)
Lymphocytes Relative: 25 %
Lymphs Abs: 0.9 10*3/uL — ABNORMAL LOW (ref 1.0–3.6)
MCH: 29.6 pg (ref 26.0–34.0)
MCHC: 34.2 g/dL (ref 32.0–36.0)
MCV: 86.6 fL (ref 80.0–100.0)
Monocytes Absolute: 0.4 10*3/uL (ref 0.2–0.9)
Monocytes Relative: 10 %
NEUTROS PCT: 62 %
Neutro Abs: 2.4 10*3/uL (ref 1.4–6.5)
Platelets: 167 10*3/uL (ref 150–440)
RBC: 4.61 MIL/uL (ref 3.80–5.20)
RDW: 13 % (ref 11.5–14.5)
WBC: 3.8 10*3/uL (ref 3.6–11.0)

## 2015-07-06 LAB — RAPID INFLUENZA A&B ANTIGENS: Influenza A (ARMC): NOT DETECTED

## 2015-07-06 LAB — BASIC METABOLIC PANEL
Anion gap: 9 (ref 5–15)
BUN: 10 mg/dL (ref 6–20)
CALCIUM: 8.8 mg/dL — AB (ref 8.9–10.3)
CHLORIDE: 94 mmol/L — AB (ref 101–111)
CO2: 28 mmol/L (ref 22–32)
CREATININE: 0.79 mg/dL (ref 0.44–1.00)
GFR calc Af Amer: >60 mL/min (ref >60)
GFR calc non Af Amer: >60 mL/min (ref >60)
GLUCOSE: 83 mg/dL (ref 65–99)
Potassium: 3.3 mmol/L — ABNORMAL LOW (ref 3.5–5.1)
Sodium: 131 mmol/L — ABNORMAL LOW (ref 135–145)

## 2015-07-06 LAB — URINALYSIS COMPLETE WITH MICROSCOPIC (ARMC ONLY)
Bilirubin Urine: NEGATIVE
GLUCOSE, UA: NEGATIVE mg/dL
Ketones, ur: NEGATIVE mg/dL
Nitrite: NEGATIVE
PH: 6 (ref 5.0–8.0)
PROTEIN: NEGATIVE mg/dL
Specific Gravity, Urine: 1.02 (ref 1.005–1.030)

## 2015-07-06 LAB — RAPID INFLUENZA A&B ANTIGENS (ARMC ONLY): INFLUENZA B (ARMC): NOT DETECTED

## 2015-07-06 MED ORDER — CIPROFLOXACIN HCL 500 MG PO TABS: 500.0000 mg | ORAL_TABLET | Freq: Two times a day (BID) | ORAL | Status: DC

## 2015-07-06 NOTE — ED Notes (Signed)
States started with sudden onset Friday of body aches, cough, runny nose, fever, headache. 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 Yesterday started with low back pain, urgency and unable to void, frequency

## 2015-07-06 NOTE — ED Provider Notes (Signed)
[facility]  ____________________________________________  Time seen: Approximately [id number] PM  I have reviewed the triage vital signs and the nursing notes.   HISTORY  Chief Complaint Influenza and Urinary Tract Infection   HPI Shirley Rogers is a 50 y.o. female presents for the complaints of 3 days of runny nose, nasal congestion, body aches and cough. Patient reports that the symptoms came on quickly Friday evening after work. 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 Reports multiple of her students were for flu positive. Patient also reports that yesterday she began to notice that she was urinating more frequently. Patient states that she has a history of urinary tract infections that normally starts in this manner. Patient states that since yesterday she has noticed urinary frequency, urinary urgency and occasional burning with urination.  Patient reports that she has generalized body aches since Friday symptoms. Denies known fevers. Reports occasional nausea. Denies vomiting or diarrhea. Patient reports that she has been having chronic abdominal pain for at least 6 months, reports that she is scheduled at the end of this week to have her gallbladder removed. Patient denies any changes of her abdominal pain from baseline. Patient reports that with her gallbladder she normally has some back pain. Patient states that she has continued to have similar back pain. Patient again denies any acute changes in abdominal or back pain.   Reports has continued to drink fluids very well but with slight decrease in appetite.  Denies chest pain, shortness of breath, dizziness, weakness, vomiting, diarrhea, rash or extremity swelling.  PCP: Wynetta Emery  Past Medical History  Diagnosis Date  . Barrett esophagus   . Diverticulosis   . Concussion     1974,1982,1987,2016,2016  . 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 Family history of adverse reaction to anesthesia     father - slow to wake  . Hypertension     no meds currently.  . Stroke ([place])     2000 - no deficits  . Hemiplegic migraine     daily  . Migraine   . GERD (gastroesophageal reflux disease)   . Vertigo     from concussive disorder.  seeing neuro 03/23/15  . Sleep apnea     CPAP machine broken  . Vascular spasm ([place])   . Complicated migraine     with facial drooping  . DDD (degenerative disc disease), lumbar     MRI 2008, L3-4, L5-S1, spondylotic changes, multilevel facet joint hypertrophic changes  . Recurrent sinusitis   . Recurrent UTI     question about IC in the past  . Depression     on paxil, lexapro, cymbalta in the past  . Anxiety     on xanax in the past  . IBS (irritable bowel syndrome)   . Fibrocystic breast disease   . Hyperlipidemia   . Colon polyp   . Hemorrhoids   . ASCVD (arteriosclerotic cardiovascular disease)   . Insomnia chronic  . Fatty liver 2013    On CT abdomen/pelvis  . Kidney stone 2013    on CT abdomen/pelvis  . Umbilical hernia 0000000    on CT abdomen/pelvis  . Normal cardiac stress test 2009    EF 66%  . Narrowing of intervertebral disc space 2011    C5-6  . Carotid atherosclerosis  2003    on ultrasound  . Dizziness due to old head injury   . Diverticulosis     Patient Active Problem List   Diagnosis Date Noted  . Postconcussion syndrome 06/10/2015  . Snoring 06/10/2015  . OSA on CPAP 06/10/2015  . 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 Globus sensation 03/30/2015  . Post-concussion syndrome 03/13/2015  . Depression 03/13/2015  . Hypertension   . Barrett esophagus   . Diverticulosis   . Hemiplegic migraine   . Lupus ([place])   . Stroke [facility])     Past Surgical History  Procedure Laterality Date  . Tubal ligation    . Breast  surgery Right     atypical hyperplasia  . Colonoscopy w/ biopsies      Removed 5 polyps  . Biopsy punch thyroid      Current Outpatient Rx  Name  Route  Sig  Dispense  Refill  . busPIRone (BUSPAR) 5 MG tablet      TAKE 1 TO 3 TABLETS BY MOUTH UP TO THREE TIMES DAILY AS NEEDED   810 tablet   0     **Patient requests 90 days supply**   . citalopram (CELEXA) 20 MG tablet      TAKE 1 AND 1/2 TABLETS(30 MG) BY MOUTH DAILY   45 tablet   1   . magnesium oxide (MAG-OX) 400 MG tablet   Oral   Take 1,200 mg by mouth daily. Reported on 05/14/2015         . omeprazole (PRILOSEC) 40 MG capsule   Oral   Take 40 mg by mouth daily.         Marland Kitchen PROAIR HFA 108 (90 BASE) MCG/ACT inhaler      INHALE 2 PUFFS PO Q 4 H PRF WHEEZING   18 g   3     Dispense as written.   . traMADol (ULTRAM) 50 MG tablet      TAKE 1 TABLET BY MOUTH EVERY 8 HOURS AS NEEDED.   84 tablet   0   . traZODone (DESYREL) 100 MG tablet   Oral   Take 1 tablet (100 mg total) by mouth at bedtime as needed for sleep.   90 tablet   1   . verapamil (VERELAN PM) 240 MG 24 hr capsule   Oral   Take 240 mg by mouth at bedtime.         . ciprofloxacin (CIPRO) 500 MG tablet   Oral   Take 1 tablet (500 mg total) by mouth 2 (two) times daily.   14 tablet   0   . Diclofenac Potassium (CAMBIA) 50 MG PACK   Oral   Take 50 mg by mouth as needed. Reported on 06/10/2015         . metoCLOPramide (REGLAN) 10 MG tablet   Oral   Take 1 tablet (10 mg total) by mouth every 6 (six) hours as needed for nausea. Patient not taking: Reported on 05/14/2015   20 tablet   0   . naproxen sodium (ANAPROX) 220 MG tablet   Oral   Take 660 mg by mouth 2 (two) times daily as needed (for pain/headaches). Reported on 06/10/2015           Allergies Rocephin; Ace inhibitors; Compazine; Morphine and related; Other; Prochlorperazine edisylate; Sulfa antibiotics; Zocor; Biaxin; Erythromycin; and Latex  Family History  Problem  Relation Age of Onset  . Other Mother  IBS  . Heart disease Father   . Diverticulosis Father   . Other Son     Ulcerative Colitis, Polonidal cyst  . Parkinson's disease Maternal Grandmother   . Cancer Maternal Grandfather     Bladder, Prostate  . Mental illness Paternal Grandmother   . Stroke Paternal Grandmother   . Migraines Father   . Cancer Maternal Grandmother   . Stroke Maternal Grandfather   . Macular degeneration    . Cancer Maternal Grandfather     bladder  . Cancer Maternal Grandfather     prostate  . Cancer Paternal Grandmother     brain cancer  . Bipolar disorder Paternal Grandmother   . Heart disease Paternal Grandfather   . Heart attack Paternal Grandfather   . Hypertension Mother   . Diabetes Brother     Social History Social History  Substance Use Topics  . Smoking status: Former Smoker -- 0.25 packs/day for 1 years    Types: Cigarettes    Quit date: 06/16/2015  . Smokeless tobacco: Never Used     Comment: 1-2 cigarettes approx 3 x/wk  . Alcohol Use: 0.0 oz/week    0 Standard drinks or equivalent per week     Comment: social    Review of Systems Constitutional: No fever/chills Eyes: No visual changes. ENT: No sore throat. positive runny nose, nasal congestion, cough. Cardiovascular: Denies chest pain. Respiratory: Denies shortness of breath. Gastrointestinal: No abdominal pain.  No nausea, no vomiting.  No diarrhea.  No constipation. Denies blood in stool or dark stool. Genitourinary:  Positive for dysuria. Musculoskeletal: Negative for back pain. Skin: Negative for rash. Neurological: Negative for headaches, focal weakness or numbness.  10-point ROS otherwise negative.  ____________________________________________   PHYSICAL EXAM:  VITAL SIGNS: ED Triage Vitals  Enc Vitals Group     BP 07/06/15 1431 134/89 mmHg     Pulse Rate 07/06/15 1431 90     Resp 07/06/15 1431 16     Temp 07/06/15 1431 96.9 F (36.1 C)     Temp Source  07/06/15 1431 Tympanic     SpO2 07/06/15 1431 100 %     Weight 07/06/15 1431 243 lb (110.224 kg)     Height 07/06/15 1431 5\' 5"  (1.651 m)     Head Cir --      Peak Flow --      Pain Score 07/06/15 1434 8     Pain Loc --      Pain Edu? --      Excl. in [place]? --     Constitutional: Alert and oriented. Well appearing and in no acute distress. Eyes: Conjunctivae are normal. PERRL. EOMI. Head: Atraumatic. No sinus tenderness to palpation.  Ears: no erythema, normal TMs bilaterally.   Nose: Nasal congestion with clear rhinorrhea.  Mouth/Throat: Mucous membranes are moist.  Oropharynx non-erythematous. No tonsillar swelling or exudate. Neck: No stridor.  No cervical spine tenderness to palpation. Hematological/Lymphatic/Immunilogical: No cervical lymphadenopathy. Cardiovascular: Normal rate, regular rhythm. Grossly normal heart sounds.  Good peripheral circulation. Respiratory: Normal respiratory effort.  No retractions. Lungs CTAB. No wheezes, rales or rhonchi. Good air movement. Gastrointestinal: Mild tenderness to palpation right upper quadrant as well as mild right CVA tenderness, per patient this is chronic and unchanged. No left CVA tenderness. Abdomen otherwise soft and nontender. Obese abdomen. Musculoskeletal: No lower or upper extremity tenderness nor edema. No cervical, thoracic or lumbar tenderness to palpation. No calf tenderness bilaterally.  Neurologic:  Normal speech and language. No gross  focal neurologic deficits are appreciated. No gait instability. Skin:  Skin is warm, dry and intact. No rash noted. Psychiatric: Mood and affect are normal. Speech and behavior are normal.  ____________________________________________   LABS (all labs ordered are listed, but only abnormal results are displayed)  Labs Reviewed  URINALYSIS COMPLETEWITH MICROSCOPIC ([place]) - Abnormal; Notable for the following:    APPearance HAZY (*)    Hgb urine dipstick 2+ (*)    Leukocytes, UA TRACE  (*)    Bacteria, UA FEW (*)    Squamous Epithelial / LPF 6-30 (*)    All other components within normal limits  CBC WITH DIFFERENTIAL/PLATELET - Abnormal; Notable for the following:    Lymphs Abs 0.9 (*)    All other components within normal limits  BASIC METABOLIC PANEL - Abnormal; Notable for the following:    Sodium 131 (*)    Potassium 3.3 (*)    Chloride 94 (*)    Calcium 8.8 (*)    All other components within normal limits  RAPID INFLUENZA A&B ANTIGENS ([facility] ONLY)  URINE CULTURE    INITIAL IMPRESSION / ASSESSMENT AND PLAN / ED COURSE  Pertinent labs & imaging results that were available during my care of the patient were reviewed by me and considered in my medical decision making (see chart for details).  Well-appearing patient. No acute distress. Ambulatory in room a steady gait. Presents for the complaints of 3 days of runny nose, nasal congestion, cough and generalized body aches has acute onset Friday evening after recent positive exposure to influenza. Patient also reports that since yesterday with some dysuria. Patient with history of recurrent urinary tract infections. States last UTI was at least 6 months ago and unsure of what she was treated with. Moist mucous membranes. Suspect viral illness such as influenza, and concern for urinary tract infection. Will evaluate CBC and BMP.  In discussing in detail with patient regarding symptom onset and time frame suspect viral infection, urinary tract infection. Influenza negative. Urinalysis positive for few bacteria, trace leukocytes, 2+ hemoglobin, hazy appearance and with reported symptoms suspect urinary tract infection. Will culture urine. With patient's chronic abdominal pain and back pain and complaints of generalized body aches, difficult to decipher symptom relations. Difficult to assess for pyelonephritis due to other chronic pain. However patient eating and drinking, tolerating oral fluids well, will treat uti with oral  Cipro and recommended follow-up with primary care physician tomorrow. Patient reports that she will take over-the-counter cough and congestion medications as needed.  Labs reviewed and discussed with patient. Sodium 131, potassium 3.3, chloride 94, calcium 8.8. Patient actively drinking Pedialyte in room and tolerating well. Again discussed in detail with patient to follow-up very closely with her primary care physician tomorrow.  Discussed follow up with Primary care physician this week. Discussed follow up and return parameters including return to urgent care proceed to ER for chest pain, shortness of breath, change or increase in abdominal pain or concern of abdominal pain,dizziness, weakness,  back pain, inability to eat or drink, inability to tolerate medications, no resolution or any worsening concerns. Patient verbalized understanding and agreed to plan.   ____________________________________________   FINAL CLINICAL IMPRESSION(S) / ED DIAGNOSES  Final diagnoses:  Viral illness  UTI (lower urinary tract infection)      Note: This dictation was prepared with Dragon dictation along with smaller phrase technology. Any transcriptional errors that result from this process are unintentional.    Marylene Land, NP 07/06/15 1849

## 2015-07-06 NOTE — Discharge Instructions (Signed)
Take medication as prescribed. Rest. Drink plenty of fluids. Eat regularly.  Follow up very closely with your primary care physician. Follow-up tomorrow as discussed.  Return to urgent care as needed. As discussed seek immediate follow-up to urgent care or ER for fevers, chest pain, shortness of breath, increased pain, inability to eat or drink, dizziness, weakness, new or worsening concerns.  Urinary Tract Infection Urinary tract infections (UTIs) can develop anywhere along your urinary tract. Your urinary tract is your body's drainage system for removing wastes and extra water. Your urinary tract includes two kidneys, two ureters, a bladder, and a urethra. Your kidneys are a pair of bean-shaped organs. Each kidney is about the size of your fist. They are located below your ribs, one on each side of your spine. CAUSES Infections are caused by microbes, which are microscopic organisms, including fungi, viruses, and bacteria. These organisms are so small that they can only be seen through a microscope. Bacteria are the microbes that most commonly cause UTIs. SYMPTOMS  Symptoms of UTIs may vary by age and gender of the patient and by the location of the infection. Symptoms in young women typically include a frequent and intense urge to urinate and a painful, burning feeling in the bladder or urethra during urination. Older women and men are more likely to be tired, shaky, and weak and have muscle aches and abdominal pain. A fever may mean the infection is in your kidneys. Other symptoms of a kidney infection include pain in your back or sides below the ribs, nausea, and vomiting. DIAGNOSIS To diagnose a UTI, your caregiver will ask you about your symptoms. Your caregiver will also ask you to provide a urine sample. The urine sample will be tested for bacteria and white blood cells. White blood cells are made by your body to help fight infection. TREATMENT  Typically, UTIs can be treated with medication.  Because most UTIs are caused by a bacterial infection, they usually can be treated with the use of antibiotics. The choice of antibiotic and length of treatment depend on your symptoms and the type of bacteria causing your infection. HOME CARE INSTRUCTIONS  If you were prescribed antibiotics, take them exactly as your caregiver instructs you. Finish the medication even if you feel better after you have only taken some of the medication.  Drink enough water and fluids to keep your urine clear or pale yellow.  Avoid caffeine, tea, and carbonated beverages. They tend to irritate your bladder.  Empty your bladder often. Avoid holding urine for long periods of time.  Empty your bladder before and after sexual intercourse.  After a bowel movement, women should cleanse from front to back. Use each tissue only once. SEEK MEDICAL CARE IF:   You have back pain.  You develop a fever.  Your symptoms do not begin to resolve within 3 days. SEEK IMMEDIATE MEDICAL CARE IF:   You have severe back pain or lower abdominal pain.  You develop chills.  You have nausea or vomiting.  You have continued burning or discomfort with urination. MAKE SURE YOU:   Understand these instructions.  Will watch your condition.  Will get help right away if you are not doing well or get worse.   This information is not intended to replace advice given to you by your health care provider. Make sure you discuss any questions you have with your health care provider.   Document Released: 02/23/2005 Document Revised: 02/04/2015 Document Reviewed: 06/24/2011 Elsevier Interactive Patient Education 2016  [place].  Viral Infections A viral infection can be caused by different types of viruses.Most viral infections are not serious and resolve on their own. However, some infections may cause severe symptoms and may lead to further complications. SYMPTOMS Viruses can frequently cause:  Minor sore throat.  Aches  and pains.  Headaches.  Runny nose.  Different types of rashes.  Watery eyes.  Tiredness.  Cough.  Loss of appetite.  Gastrointestinal infections, resulting in nausea, vomiting, and diarrhea. These symptoms do not respond to antibiotics because the infection is not caused by bacteria. However, you might catch a bacterial infection following the viral infection. This is sometimes called a "superinfection." Symptoms of such a bacterial infection may include:  Worsening sore throat with pus and difficulty swallowing.  Swollen neck glands.  Chills and a high or persistent fever.  Severe headache.  Tenderness over the sinuses.  Persistent overall ill feeling (malaise), muscle aches, and tiredness (fatigue).  Persistent cough.  Yellow, green, or brown mucus production with coughing. HOME CARE INSTRUCTIONS   Only take over-the-counter or prescription medicines for pain, discomfort, diarrhea, or fever as directed by your caregiver.  Drink enough water and fluids to keep your urine clear or pale yellow. Sports drinks can provide valuable electrolytes, sugars, and hydration.  Get plenty of rest and maintain proper nutrition. Soups and broths with crackers or rice are fine. SEEK IMMEDIATE MEDICAL CARE IF:   You have severe headaches, shortness of breath, chest pain, neck pain, or an unusual rash.  You have uncontrolled vomiting, diarrhea, or you are unable to keep down fluids.  You or your child has an oral temperature above 102 F (38.9 C), not controlled by medicine.  Your baby is older than 3 months with a rectal temperature of 102 F (38.9 C) or higher.  Your baby is 40 months old or younger with a rectal temperature of 100.4 F (38 C) or higher. MAKE SURE YOU:   Understand these instructions.  Will watch your condition.  Will get help right away if you are not doing well or get worse.   This information is not intended to replace advice given to you by your  health care provider. Make sure you discuss any questions you have with your health care provider.   Document Released: 02/23/2005 Document Revised: 08/08/2011 Document Reviewed: 10/22/2014 Elsevier Interactive Patient Education [employer].

## 2015-07-08 LAB — URINE CULTURE

## 2015-07-10 ENCOUNTER — Other Ambulatory Visit: Payer: Self-pay

## 2015-07-10 NOTE — Pre-Procedure Instructions (Signed)
Met B results sent to anesthesia and Dr. Nicholes Stairs for review.

## 2015-07-14 ENCOUNTER — Telehealth: Payer: Self-pay | Admitting: Emergency Medicine

## 2015-07-14 NOTE — ED Notes (Signed)
Patient was notified of her urine culture result.  Patient states that she is feeling better.  Patient was instructed to finish all of her antibiotic and that if her symptoms do not improve to follow-up with her PCP. Patient verbalized understanding.

## 2015-07-20 ENCOUNTER — Other Ambulatory Visit: Payer: Self-pay | Admitting: Family Medicine

## 2015-07-20 ENCOUNTER — Telehealth: Payer: Self-pay | Admitting: *Deleted

## 2015-07-20 MED ORDER — TRAMADOL HCL 50 MG PO TABS: ORAL_TABLET | ORAL | Status: DC

## 2015-07-20 NOTE — Telephone Encounter (Signed)
Fax Refill request for Meloxicam.  LOV 06/2014, pt needs an appt if continuing to have pain.  Return fax as denied.

## 2015-07-23 ENCOUNTER — Ambulatory Visit: Payer: BC Managed Care – PPO | Admitting: Diagnostic Neuroimaging

## 2015-07-24 ENCOUNTER — Ambulatory Visit: Payer: BC Managed Care – PPO | Admitting: Anesthesiology

## 2015-07-24 ENCOUNTER — Encounter
Admission: RE | Disposition: A | Discharge status: Home / Self Care | Payer: Self-pay | Source: Ambulatory Visit | Attending: Surgery

## 2015-07-24 ENCOUNTER — Ambulatory Visit: Payer: BC Managed Care – PPO

## 2015-07-24 ENCOUNTER — Ambulatory Visit
Admission: RE | Admit: 2015-07-24 | Discharge: 2015-07-24 | Disposition: A | Discharge status: Home / Self Care | Payer: BC Managed Care – PPO | Source: Ambulatory Visit | Attending: Surgery | Admitting: Surgery

## 2015-07-24 ENCOUNTER — Encounter: Payer: Self-pay | Admitting: *Deleted

## 2015-07-24 DIAGNOSIS — Z79899 Other long term (current) drug therapy: Secondary | ICD-10-CM | POA: Diagnosis not present

## 2015-07-24 DIAGNOSIS — Z8042 Family history of malignant neoplasm of prostate: Secondary | ICD-10-CM | POA: Diagnosis not present

## 2015-07-24 DIAGNOSIS — Z823 Family history of stroke: Secondary | ICD-10-CM | POA: Insufficient documentation

## 2015-07-24 DIAGNOSIS — Z885 Allergy status to narcotic agent status: Secondary | ICD-10-CM | POA: Insufficient documentation

## 2015-07-24 DIAGNOSIS — Z91040 Latex allergy status: Secondary | ICD-10-CM | POA: Insufficient documentation

## 2015-07-24 DIAGNOSIS — G43409 Hemiplegic migraine, not intractable, without status migrainosus: Secondary | ICD-10-CM | POA: Insufficient documentation

## 2015-07-24 DIAGNOSIS — M329 Systemic lupus erythematosus, unspecified: Secondary | ICD-10-CM | POA: Insufficient documentation

## 2015-07-24 DIAGNOSIS — Z8052 Family history of malignant neoplasm of bladder: Secondary | ICD-10-CM | POA: Insufficient documentation

## 2015-07-24 DIAGNOSIS — Z8673 Personal history of transient ischemic attack (TIA), and cerebral infarction without residual deficits: Secondary | ICD-10-CM | POA: Insufficient documentation

## 2015-07-24 DIAGNOSIS — K219 Gastro-esophageal reflux disease without esophagitis: Secondary | ICD-10-CM | POA: Insufficient documentation

## 2015-07-24 DIAGNOSIS — Z6841 Body Mass Index (BMI) 40.0 and over, adult: Secondary | ICD-10-CM | POA: Diagnosis not present

## 2015-07-24 DIAGNOSIS — Z8379 Family history of other diseases of the digestive system: Secondary | ICD-10-CM | POA: Diagnosis not present

## 2015-07-24 DIAGNOSIS — Z8719 Personal history of other diseases of the digestive system: Secondary | ICD-10-CM | POA: Diagnosis not present

## 2015-07-24 DIAGNOSIS — Z87891 Personal history of nicotine dependence: Secondary | ICD-10-CM | POA: Diagnosis not present

## 2015-07-24 DIAGNOSIS — Z881 Allergy status to other antibiotic agents status: Secondary | ICD-10-CM | POA: Diagnosis not present

## 2015-07-24 DIAGNOSIS — Z7951 Long term (current) use of inhaled steroids: Secondary | ICD-10-CM | POA: Insufficient documentation

## 2015-07-24 DIAGNOSIS — Z882 Allergy status to sulfonamides status: Secondary | ICD-10-CM | POA: Diagnosis not present

## 2015-07-24 DIAGNOSIS — I739 Peripheral vascular disease, unspecified: Secondary | ICD-10-CM | POA: Insufficient documentation

## 2015-07-24 DIAGNOSIS — K8020 Calculus of gallbladder without cholecystitis without obstruction: Secondary | ICD-10-CM

## 2015-07-24 DIAGNOSIS — F329 Major depressive disorder, single episode, unspecified: Secondary | ICD-10-CM | POA: Insufficient documentation

## 2015-07-24 DIAGNOSIS — M1990 Unspecified osteoarthritis, unspecified site: Secondary | ICD-10-CM | POA: Diagnosis not present

## 2015-07-24 DIAGNOSIS — Z8249 Family history of ischemic heart disease and other diseases of the circulatory system: Secondary | ICD-10-CM | POA: Diagnosis not present

## 2015-07-24 DIAGNOSIS — Z888 Allergy status to other drugs, medicaments and biological substances status: Secondary | ICD-10-CM | POA: Diagnosis not present

## 2015-07-24 DIAGNOSIS — F418 Other specified anxiety disorders: Secondary | ICD-10-CM | POA: Insufficient documentation

## 2015-07-24 DIAGNOSIS — E669 Obesity, unspecified: Secondary | ICD-10-CM | POA: Diagnosis not present

## 2015-07-24 DIAGNOSIS — F17200 Nicotine dependence, unspecified, uncomplicated: Secondary | ICD-10-CM | POA: Diagnosis not present

## 2015-07-24 DIAGNOSIS — I10 Essential (primary) hypertension: Secondary | ICD-10-CM | POA: Insufficient documentation

## 2015-07-24 DIAGNOSIS — R51 Headache: Secondary | ICD-10-CM | POA: Insufficient documentation

## 2015-07-24 DIAGNOSIS — K8010 Calculus of gallbladder with chronic cholecystitis without obstruction: Secondary | ICD-10-CM | POA: Insufficient documentation

## 2015-07-24 DIAGNOSIS — R1011 Right upper quadrant pain: Secondary | ICD-10-CM | POA: Diagnosis not present

## 2015-07-24 DIAGNOSIS — G4730 Sleep apnea, unspecified: Secondary | ICD-10-CM | POA: Insufficient documentation

## 2015-07-24 HISTORY — PX: CHOLECYSTECTOMY: SHX55

## 2015-07-24 SURGERY — LAPAROSCOPIC CHOLECYSTECTOMY WITH INTRAOPERATIVE CHOLANGIOGRAM
Anesthesia: General | Wound class: Clean Contaminated

## 2015-07-24 MED ORDER — PROPOFOL 10 MG/ML IV BOLUS
INTRAVENOUS | Status: DC | PRN
  Administered 2015-07-24: 200 mg via INTRAVENOUS

## 2015-07-24 MED ORDER — ROCURONIUM BROMIDE 100 MG/10ML IV SOLN
INTRAVENOUS | Status: DC | PRN
  Administered 2015-07-24: 40 mg via INTRAVENOUS

## 2015-07-24 MED ORDER — ONDANSETRON HCL 4 MG/2ML IJ SOLN: 4.0000 mg | Freq: Once | INTRAMUSCULAR | Status: DC | PRN

## 2015-07-24 MED ORDER — OXYCODONE-ACETAMINOPHEN 5-325 MG PO TABS
ORAL_TABLET | ORAL | Status: AC
  Filled 2015-07-24: qty 2

## 2015-07-24 MED ORDER — FENTANYL CITRATE (PF) 100 MCG/2ML IJ SOLN
INTRAMUSCULAR | Status: AC
  Administered 2015-07-24: 25 ug via INTRAVENOUS
  Filled 2015-07-24: qty 2

## 2015-07-24 MED ORDER — FENTANYL CITRATE (PF) 100 MCG/2ML IJ SOLN
INTRAMUSCULAR | Status: AC
  Filled 2015-07-24: qty 2

## 2015-07-24 MED ORDER — PHENYLEPHRINE HCL 10 MG/ML IJ SOLN
INTRAMUSCULAR | Status: DC | PRN
  Administered 2015-07-24: 200 ug via INTRAVENOUS

## 2015-07-24 MED ORDER — ACETAMINOPHEN 10 MG/ML IV SOLN
INTRAVENOUS | Status: DC | PRN
  Administered 2015-07-24: 1000 mg via INTRAVENOUS

## 2015-07-24 MED ORDER — LACTATED RINGERS IV SOLN
INTRAVENOUS | Status: DC
  Administered 2015-07-24 (×3): via INTRAVENOUS

## 2015-07-24 MED ORDER — NEOSTIGMINE METHYLSULFATE 10 MG/10ML IV SOLN
INTRAVENOUS | Status: DC | PRN
  Administered 2015-07-24: 4 mg via INTRAVENOUS

## 2015-07-24 MED ORDER — LIDOCAINE HCL (CARDIAC) 20 MG/ML IV SOLN
INTRAVENOUS | Status: DC | PRN
  Administered 2015-07-24: 30 mg via INTRAVENOUS

## 2015-07-24 MED ORDER — DEXAMETHASONE SODIUM PHOSPHATE 10 MG/ML IJ SOLN
INTRAMUSCULAR | Status: DC | PRN
  Administered 2015-07-24: 5 mg via INTRAVENOUS

## 2015-07-24 MED ORDER — FENTANYL CITRATE (PF) 100 MCG/2ML IJ SOLN
INTRAMUSCULAR | Status: DC | PRN
  Administered 2015-07-24: 50 ug via INTRAVENOUS
  Administered 2015-07-24 (×2): 100 ug via INTRAVENOUS

## 2015-07-24 MED ORDER — MIDAZOLAM HCL 2 MG/2ML IJ SOLN
INTRAMUSCULAR | Status: DC | PRN
Start: 2015-07-24 — End: 2015-07-24
  Administered 2015-07-24: 2 mg via INTRAVENOUS

## 2015-07-24 MED ORDER — KETOROLAC TROMETHAMINE 30 MG/ML IJ SOLN
INTRAMUSCULAR | Status: DC | PRN
  Administered 2015-07-24: 30 mg via INTRAVENOUS

## 2015-07-24 MED ORDER — FENTANYL CITRATE (PF) 100 MCG/2ML IJ SOLN
25.0000 ug | INTRAMUSCULAR | Status: DC | PRN
  Administered 2015-07-24 (×5): 25 ug via INTRAVENOUS

## 2015-07-24 MED ORDER — EPHEDRINE SULFATE 50 MG/ML IJ SOLN
INTRAMUSCULAR | Status: DC | PRN
  Administered 2015-07-24: 20 mg via INTRAVENOUS
  Administered 2015-07-24: 10 mg via INTRAVENOUS
  Administered 2015-07-24: 20 mg via INTRAVENOUS

## 2015-07-24 MED ORDER — ONDANSETRON HCL 4 MG/2ML IJ SOLN
INTRAMUSCULAR | Status: DC | PRN
  Administered 2015-07-24: 4 mg via INTRAVENOUS

## 2015-07-24 MED ORDER — OXYCODONE-ACETAMINOPHEN 5-325 MG PO TABS
1.0000 | ORAL_TABLET | ORAL | Status: DC | PRN
  Administered 2015-07-24: 2 via ORAL

## 2015-07-24 MED ORDER — OXYCODONE-ACETAMINOPHEN 5-325 MG PO TABS: 1.0000 | ORAL_TABLET | ORAL | Status: DC | PRN

## 2015-07-24 MED ORDER — GLYCOPYRROLATE 0.2 MG/ML IJ SOLN
INTRAMUSCULAR | Status: DC | PRN
  Administered 2015-07-24: 0.6 mg via INTRAVENOUS

## 2015-07-24 MED ORDER — ACETAMINOPHEN 10 MG/ML IV SOLN
INTRAVENOUS | Status: AC
  Filled 2015-07-24: qty 100

## 2015-07-24 SURGICAL SUPPLY — 36 items
APPLIER CLIP ROT 10 11.4 M/L (STAPLE) ×2
CANISTER SUCT 1200ML W/VALVE (MISCELLANEOUS) ×2 IMPLANT
CANNULA DILATOR 10 W/SLV (CANNULA) ×2 IMPLANT
CATH REDDICK CHOLANGI 4FR 50CM (CATHETERS) ×2 IMPLANT
CHLORAPREP W/TINT 26ML (MISCELLANEOUS) ×2 IMPLANT
CLIP APPLIE ROT 10 11.4 M/L (STAPLE) ×1 IMPLANT
DRAPE SHEET LG 3/4 BI-LAMINATE (DRAPES) ×2 IMPLANT
ELECT REM PT RETURN 9FT ADLT (ELECTROSURGICAL) ×2
ELECTRODE REM PT RTRN 9FT ADLT (ELECTROSURGICAL) ×1 IMPLANT
GAUZE SPONGE 4X4 12PLY STRL (GAUZE/BANDAGES/DRESSINGS) ×2 IMPLANT
GLOVE BIO SURGEON STRL SZ7.5 (GLOVE) IMPLANT
GLOVE PROTEXIS LATEX SZ 7.5 (GLOVE) ×8 IMPLANT
GOWN STRL REUS W/ TWL LRG LVL3 (GOWN DISPOSABLE) ×3 IMPLANT
GOWN STRL REUS W/TWL LRG LVL3 (GOWN DISPOSABLE) ×3
IRRIGATION STRYKERFLOW (MISCELLANEOUS) ×1 IMPLANT
IRRIGATOR STRYKERFLOW (MISCELLANEOUS) ×2
IV NS 1000ML (IV SOLUTION) ×1
IV NS 1000ML BAXH (IV SOLUTION) ×1 IMPLANT
KIT RM TURNOVER STRD PROC AR (KITS) ×2 IMPLANT
LABEL OR SOLS (LABEL) IMPLANT
NDL INSUFF ACCESS 14 VERSASTEP (NEEDLE) ×2 IMPLANT
NEEDLE FILTER BLUNT 18X 1/2SAF (NEEDLE) ×1
NEEDLE FILTER BLUNT 18X1 1/2 (NEEDLE) ×1 IMPLANT
NS IRRIG 500ML POUR BTL (IV SOLUTION) ×2 IMPLANT
PACK LAP CHOLECYSTECTOMY (MISCELLANEOUS) ×2 IMPLANT
SCISSORS METZENBAUM CVD 33 (INSTRUMENTS) ×2 IMPLANT
SEAL FOR SCOPE WARMER C3101 (MISCELLANEOUS) ×2 IMPLANT
SLEEVE ENDOPATH XCEL 5M (ENDOMECHANICALS) ×2 IMPLANT
STRIP CLOSURE SKIN 1/2X4 (GAUZE/BANDAGES/DRESSINGS) ×2 IMPLANT
SUT CHROMIC 5 0 RB 1 27 (SUTURE) ×2 IMPLANT
SUT VIC AB 0 CT2 27 (SUTURE) IMPLANT
SYR 3ML LL SCALE MARK (SYRINGE) ×2 IMPLANT
TROCAR XCEL NON-BLD 11X100MML (ENDOMECHANICALS) ×2 IMPLANT
TROCAR XCEL NON-BLD 5MMX100MML (ENDOMECHANICALS) ×2 IMPLANT
TUBING INSUFFLATOR HI FLOW (MISCELLANEOUS) ×2 IMPLANT
WATER STERILE IRR 1000ML POUR (IV SOLUTION) ×2 IMPLANT

## 2015-07-24 NOTE — Anesthesia Procedure Notes (Signed)
Procedure Name: Intubation Performed by: Angelia Hazell Pre-anesthesia Checklist: Patient identified, Patient being monitored, Timeout performed, Emergency Drugs available and Suction available Patient Re-evaluated:Patient Re-evaluated prior to inductionOxygen Delivery Method: Circle system utilized Preoxygenation: Pre-oxygenation with 100% oxygen Intubation Type: IV induction Ventilation: Mask ventilation without difficulty Laryngoscope Size: Mac and 3 Grade View: Grade I Tube type: Oral Tube size: 7.0 mm Number of attempts: 1 Airway Equipment and Method: Stylet Placement Confirmation: ETT inserted through vocal cords under direct vision,  positive ETCO2 and breath sounds checked- equal and bilateral Secured at: 21 cm Tube secured with: Tape Dental Injury: Teeth and Oropharynx as per pre-operative assessment        

## 2015-07-24 NOTE — Op Note (Signed)
OPERATIVE REPORT  PREOPERATIVE DIAGNOSIS:  Chronic cholecystitis cholelithiasis  POSTOPERATIVE DIAGNOSIS: Chronic cholecystitis cholelithiasis  PROCEDURE: Laparoscopic cholecystectomy  ANESTHESIA: General  SURGEON: Rochel Brome M.D.  INDICATIONS: She has history of right upper quadrant pains and fatty food intolerance. MRCP demonstrated sludge within the gallbladder representing tiny stones. Laparoscopic cholecystectomy was recommended for definitive treatment.  With the patient on the operating table in the supine position under general endotracheal anesthesia the abdomen was prepared with ChloraPrep solution and draped in a sterile manner. A short incision was made in the inferior aspect of the umbilicus and carried down to the deep fascia which was grasped with a laryngeal hook. A Veress needle was inserted aspirated and irrigated with a saline solution. The peritoneal cavity was insufflated with carbon dioxide. The Veress needle was removed. The 10 mm cannula was inserted. The 10 mm 0 laparoscope was inserted to view the peritoneal cavity.  Another incision was made in the epigastrium slightly to the right of the midline to introduce an 11 mm cannula. 2 incisions were made in the lateral aspect of the right upper quadrant to introduce 2   5 mm cannulas. The liver appeared to have a smooth surface. The gallbladder appeared typical.  The gallbladder was retracted towards the right shoulder.  The gallbladder neck was retracted inferiorly and laterally.  The porta hepatis was identified. The gallbladder was mobilized with incision of the visceral peritoneum. The cystic duct was dissected free from surrounding structures. The cystic artery was dissected free from surrounding structures. A critical view of safety was demonstrated  An Endo Clip was placed across the cystic duct adjacent to the gallbladder neck. An incision was made in the cystic duct to introduce a Reddick catheter. The cystic duct  appeared to be small in size and the  Reddick catheter would not thread into the cystic duct. The Reddick catheter was removed. The cystic duct was doubly ligated with endoclips and divided. The cystic artery was controlled with double endoclips and divided. The gallbladder was dissected free from the liver with use of hook and cautery and blunt dissection. Bleeding was minimal and hemostasis was intact. The gallbladder was delivered up through the infraumbilical incision opened and suctioned.  There were some small stones within the gallbladder. Also cholesterolosis was demonstrated. The gallbladder was submitted in formalin for routine pathology. The right upper quadrant was further inspected and could see hemostasis was intact. The cannulas were removed allowing carbon dioxide to escape from the peritoneal cavity. The skin incisions were closed with 5-0 chromic subcuticular suture benzoin and Steri-Strips. Dressings were applied with paper tape  The patient tolerated the procedure satisfactorily and was then prepared for transfer to the recovery room  Rochel Brome M.D.    The skin incisions were closed with interrupted 5-0 chromic subcutaneous suture benzoin and Steri-Strips. Gauze dressings were applied with paper tape.  The patient appeared to be in satisfactory condition and was prepared for transfer to the recovery room  [place].D.

## 2015-07-24 NOTE — Transfer of Care (Signed)
Immediate Anesthesia Transfer of Care Note  Patient: Shirley Rogers  Procedure(s) Performed: Procedure(s): LAPAROSCOPIC CHOLECYSTECTOMY WITH INTRAOPERATIVE CHOLANGIOGRAM (N/A)  Patient Location: PACU  Anesthesia Type:General  Level of Consciousness: awake, alert  and oriented  Airway & Oxygen Therapy: Patient Spontanous Breathing and Patient connected to nasal cannula oxygen  Post-op Assessment: Report given to RN and Post -op Vital signs reviewed and stable  Post vital signs: Reviewed and stable  Last Vitals:  Filed Vitals:   07/24/15 1032  BP: 158/104  Pulse: 77  Temp: 36.8 C  Resp: 16    Complications: No apparent anesthesia complications

## 2015-07-24 NOTE — H&P (Signed)
  She reports continued right upper quadrant pains associated with eating. MRCP with sludge in her gallbladder.  She reports no change in overall condition since the day of the office visit.  Recent lab work with normal PT and PTT and platelet function.  Blood pressure is elevated has started on verapamil and does have cardiology follow-up planned.  I discussed the plan for laparoscopic cholecystectomy

## 2015-07-24 NOTE — Anesthesia Preprocedure Evaluation (Signed)
Anesthesia Evaluation  Patient identified by MRN, date of birth, ID band Patient awake    Reviewed: Allergy & Precautions, NPO status , Patient's Chart, lab work & pertinent test results  Airway Mallampati: III  TM Distance: <3 FB Neck ROM: Full    Dental  (+) Chipped, Caps   Pulmonary sleep apnea , former smoker,    Pulmonary exam normal breath sounds clear to auscultation       Cardiovascular hypertension, Pt. on medications + Peripheral Vascular Disease  Normal cardiovascular exam     Neuro/Psych  Headaches, PSYCHIATRIC DISORDERS Anxiety Depression Hx of concussion CVA, No Residual Symptoms    GI/Hepatic Neg liver ROS, GERD  Medicated and Controlled,  Endo/Other  negative endocrine ROS  Renal/GU stones     Musculoskeletal  (+) Arthritis , Osteoarthritis,    Abdominal Normal abdominal exam  (+) + obese,   Peds negative pediatric ROS (+)  Hematology negative hematology ROS (+)   Anesthesia Other Findings   Reproductive/Obstetrics                             Anesthesia Physical Anesthesia Plan  ASA: III  Anesthesia Plan: General   Post-op Pain Management:    Induction: Intravenous  Airway Management Planned: Oral ETT  Additional Equipment:   Intra-op Plan:   Post-operative Plan: Extubation in OR  Informed Consent: I have reviewed the patients History and Physical, chart, labs and discussed the procedure including the risks, benefits and alternatives for the proposed anesthesia with the patient or authorized representative who has indicated his/her understanding and acceptance.   Dental advisory given  Plan Discussed with: CRNA and Surgeon  Anesthesia Plan Comments:         Anesthesia Quick Evaluation

## 2015-07-24 NOTE — Discharge Instructions (Addendum)
Take Tylenol or Percocet if needed for pain.  Remove dressings on Saturday. May shower Sunday.  Avoid straining and heavy lifting for the first week after surgery.  Laparoscopic Cholecystectomy, Care After Refer to this sheet in the next few weeks. These instructions provide you with information about caring for yourself after your procedure. Your health care provider may also give you more specific instructions. Your treatment has been planned according to current medical practices, but problems sometimes occur. Call your health care provider if you have any problems or questions after your procedure. WHAT TO EXPECT AFTER THE PROCEDURE After your procedure, it is common to have:  Pain at your incision sites. You will be given pain medicines to control your pain.  Mild nausea or vomiting. This should improve after the first 24 hours.  Bloating and possible shoulder pain from the gas that was used during the procedure. This will improve after the first 24 hours. HOME CARE INSTRUCTIONS Incision Care  Follow instructions from your health care provider about how to take care of your incisions. Make sure you:  Wash your hands with soap and water before you change your bandage (dressing). If soap and water are not available, use hand sanitizer.  Change your dressing as told by your health care provider.  Leave stitches (sutures), skin glue, or adhesive strips in place. These skin closures may need to be in place for 2 weeks or longer. If adhesive strip edges start to loosen and curl up, you may trim the loose edges. Do not remove adhesive strips completely unless your health care provider tells you to do that.  Do not take baths, swim, or use a hot tub until your health care provider approves. Ask your health care provider if you can take showers. You may only be allowed to take sponge baths for bathing. General Instructions  Take over-the-counter and prescription medicines only as told by  your health care provider.  Do not drive or operate heavy machinery while taking prescription pain medicine.  Return to your normal diet as told by your health care provider.  Do not lift anything that is heavier than 10 lb (4.5 kg).  Do not play contact sports for one week or until your health care provider approves. SEEK MEDICAL CARE IF:   You have redness, swelling, or pain at the site of your incision.  You have fluid, blood, or pus coming from your incision.  You notice a bad smell coming from your incision area.  Your surgical incisions break open.  You have a fever. SEEK IMMEDIATE MEDICAL CARE IF:  You develop a rash.  You have difficulty breathing.  You have chest pain.  You have increasing pain in your shoulders (shoulder strap areas).  You faint or have dizzy episodes while you are standing.  You have severe pain in your abdomen.  You have nausea or vomiting that lasts for more than one day.   This information is not intended to replace advice given to you by your health care provider. Make sure you discuss any questions you have with your health care provider.   Document Released: 05/16/2005 Document Revised: 02/04/2015 Document Reviewed: 12/26/2012 Elsevier Interactive Patient Education 2016 [place] Anesthesia, Adult, Care After Refer to this sheet in the next few weeks. These instructions provide you with information on caring for yourself after your procedure. Your health care provider may also give you more specific instructions. Your treatment has been planned according to current medical practices, but  problems sometimes occur. Call your health care provider if you have any problems or questions after your procedure. WHAT TO EXPECT AFTER THE PROCEDURE After the procedure, it is typical to experience:  Sleepiness.  Nausea and vomiting. HOME CARE INSTRUCTIONS  For the first 24 hours after general anesthesia:  Have a responsible  person with you.  Do not drive a car. If you are alone, do not take public transportation.  Do not drink alcohol.  Do not take medicine that has not been prescribed by your health care provider.  Do not sign important papers or make important decisions.  You may resume a normal diet and activities as directed by your health care provider.  Change bandages (dressings) as directed.  If you have questions or problems that seem related to general anesthesia, call the hospital and ask for the anesthetist or anesthesiologist on call. SEEK MEDICAL CARE IF:  You have nausea and vomiting that continue the day after anesthesia.  You develop a rash. SEEK IMMEDIATE MEDICAL CARE IF:   You have difficulty breathing.  You have chest pain.  You have any allergic problems.   This information is not intended to replace advice given to you by your health care provider. Make sure you discuss any questions you have with your health care provider.   Document Released: 08/22/2000 Document Revised: 06/06/2014 Document Reviewed: 09/14/2011 Elsevier Interactive Patient Education [employer].

## 2015-07-24 NOTE — Anesthesia Postprocedure Evaluation (Signed)
Anesthesia Post Note  Patient: Shirley Rogers  Procedure(s) Performed: Procedure(s) (LRB): LAPAROSCOPIC CHOLECYSTECTOMY WITH INTRAOPERATIVE CHOLANGIOGRAM (N/A)  Patient location during evaluation: PACU Anesthesia Type: General Level of consciousness: awake and alert and oriented Pain management: pain level controlled Vital Signs Assessment: post-procedure vital signs reviewed and stable Respiratory status: spontaneous breathing Cardiovascular status: blood pressure returned to baseline Anesthetic complications: no    Last Vitals:  Filed Vitals:   07/24/15 1518 07/24/15 1528  BP: 127/82 130/80  Pulse: 86 78  Temp:    Resp: 16 17    Last Pain:  Filed Vitals:   07/24/15 1528  PainSc: 6                  [place]field [place]

## 2015-07-27 ENCOUNTER — Encounter: Payer: Self-pay | Admitting: Diagnostic Neuroimaging

## 2015-07-27 LAB — SURGICAL PATHOLOGY

## 2015-07-28 ENCOUNTER — Encounter: Payer: Self-pay | Admitting: Surgery

## 2015-08-11 ENCOUNTER — Ambulatory Visit (INDEPENDENT_AMBULATORY_CARE_PROVIDER_SITE_OTHER): Payer: BC Managed Care – PPO | Admitting: Family Medicine

## 2015-08-11 ENCOUNTER — Telehealth: Payer: Self-pay | Admitting: Neurology

## 2015-08-11 ENCOUNTER — Encounter: Payer: Self-pay | Admitting: Family Medicine

## 2015-08-11 VITALS — BP 126/86 | HR 98 | Temp 98.6°F | Ht 64.2 in | Wt 246.0 lb

## 2015-08-11 DIAGNOSIS — L03317 Cellulitis of buttock: Secondary | ICD-10-CM | POA: Diagnosis not present

## 2015-08-11 DIAGNOSIS — R519 Headache, unspecified: Secondary | ICD-10-CM

## 2015-08-11 DIAGNOSIS — R51 Headache: Principal | ICD-10-CM

## 2015-08-11 MED ORDER — DOXYCYCLINE HYCLATE 100 MG PO TABS: 100.0000 mg | ORAL_TABLET | Freq: Two times a day (BID) | ORAL | Status: DC

## 2015-08-11 NOTE — Progress Notes (Signed)
BP 126/86 mmHg  Pulse 98  Temp(Src) 98.6 F (37 C)  Ht 5' 4.2" (1.631 m)  Wt 246 lb (111.585 kg)  BMI 41.95 kg/m2  SpO2 98%  LMP 07/01/2013   Subjective:    Patient ID: Shirley Rogers, female    DOB: 1965-07-06, 50 y.o.   MRN: [id number]  HPI: Shirley Rogers is a 50 y.o. female  Chief Complaint  Patient presents with  . Abscess    Patient has a place on her buttucks that she had her room mate pop. It is scabbing over now but is concerned that she may have MRSA   SKIN INFECTION- had one earlier in the same spot when she was baby, rolled over 2 days ago and it burst, tried to drain it and couldn't get anything out. Has been getting in sitz baths, has been using bactroban Duration: 4-5 days ago Location: on the top of her bottom History of trauma in area: no Pain: yes Quality: sharp Severity: severe Redness: yes Swelling: yes Oozing: yes Pus: yes Fevers: yes Nausea/vomiting: yes Status: better Treatments attempted:antibiotics and warm compresses  Tetanus: UTD  Relevant past medical, surgical, family and social history reviewed and updated as indicated. Interim medical history since our last visit reviewed. Allergies and medications reviewed and updated.  Review of Systems  Constitutional: Negative.   Respiratory: Negative.   Cardiovascular: Negative.   Musculoskeletal: Negative.   Skin: Positive for rash and wound. Negative for color change and pallor.  Psychiatric/Behavioral: Negative.     Per HPI unless specifically indicated above     Objective:    BP 126/86 mmHg  Pulse 98  Temp(Src) 98.6 F (37 C)  Ht 5' 4.2" (1.631 m)  Wt 246 lb (111.585 kg)  BMI 41.95 kg/m2  SpO2 98%  LMP 07/01/2013  Wt Readings from Last 3 Encounters:  08/11/15 246 lb (111.585 kg)  07/06/15 243 lb (110.224 kg)  07/02/15 243 lb (110.224 kg)    Physical Exam  Constitutional: She is oriented to person, place, and time. She appears well-developed and well-nourished. No distress.   HENT:  Head: Normocephalic and atraumatic.  Right Ear: Hearing normal.  Left Ear: Hearing normal.  Nose: Nose normal.  Eyes: Conjunctivae and lids are normal. Right eye exhibits no discharge. Left eye exhibits no discharge. No scleral icterus.  Cardiovascular: Normal rate, regular rhythm, normal heart sounds and intact distal pulses.  Exam reveals no gallop and no friction rub.   No murmur heard. Pulmonary/Chest: Effort normal and breath sounds normal. No respiratory distress. She has no wheezes. She has no rales. She exhibits no tenderness.  Musculoskeletal: Normal range of motion.  Neurological: She is alert and oriented to person, place, and time.  Skin: Skin is warm, dry and intact. No rash noted. There is erythema (1 cm warm erythematous lesion on the L upper buttock, no fluctuance + heat). No pallor.  Psychiatric: She has a normal mood and affect. Her speech is normal and behavior is normal. Judgment and thought content normal. Cognition and memory are normal.  Nursing note and vitals reviewed.       Assessment & Plan:   Problem List Items Addressed This Visit    None    Visit Diagnoses    Cellulitis of buttock    -  Primary    Mild. Continue hot compresses. Will start on doxycycline. Call if not getting better or getting worse.         Follow up plan: Return if symptoms  worsen or fail to improve.

## 2015-08-11 NOTE — Telephone Encounter (Signed)
Patient called to set up her Sleep Study, there isn't an order in my workqueue for a sleep study

## 2015-08-13 ENCOUNTER — Encounter: Payer: Self-pay | Admitting: Diagnostic Neuroimaging

## 2015-08-13 ENCOUNTER — Ambulatory Visit (INDEPENDENT_AMBULATORY_CARE_PROVIDER_SITE_OTHER): Payer: BC Managed Care – PPO | Admitting: Diagnostic Neuroimaging

## 2015-08-13 ENCOUNTER — Telehealth: Payer: Self-pay | Admitting: Family Medicine

## 2015-08-13 VITALS — BP 143/91 | HR 83 | Ht 64.0 in | Wt 250.0 lb

## 2015-08-13 DIAGNOSIS — R29898 Other symptoms and signs involving the musculoskeletal system: Secondary | ICD-10-CM

## 2015-08-13 DIAGNOSIS — F0781 Postconcussional syndrome: Secondary | ICD-10-CM | POA: Diagnosis not present

## 2015-08-13 DIAGNOSIS — G43409 Hemiplegic migraine, not intractable, without status migrainosus: Secondary | ICD-10-CM | POA: Diagnosis not present

## 2015-08-13 DIAGNOSIS — M6289 Other specified disorders of muscle: Secondary | ICD-10-CM

## 2015-08-13 MED ORDER — AMOXICILLIN-POT CLAVULANATE 875-125 MG PO TABS: 1.0000 | ORAL_TABLET | Freq: Two times a day (BID) | ORAL | Status: DC

## 2015-08-13 NOTE — Telephone Encounter (Signed)
Augmentin sent to her pharmacy.

## 2015-08-13 NOTE — Telephone Encounter (Signed)
Pt called and stated that she had a temp of 100 yesterday and she is really achy but can't take the medication she was given for the infection on Tuesday because it makes her sick .She would like to have something else sent to [facility].

## 2015-08-13 NOTE — Telephone Encounter (Signed)
Patient has taken penicillin in the past, some itching but can be tolerated. Rocephin- throat swelling
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
## 2015-08-13 NOTE — Telephone Encounter (Signed)
Can you make sure that she is taking her medicine with food, and not just a cracker, but like a full meal. If she is, can you find out if she is allergic to penicillin- she's allergic to rocephin, but I don't have reactions.

## 2015-08-18 ENCOUNTER — Other Ambulatory Visit: Payer: Self-pay | Admitting: Family Medicine

## 2015-08-18 MED ORDER — TRAMADOL HCL 50 MG PO TABS: ORAL_TABLET | ORAL | Status: DC

## 2015-09-14 ENCOUNTER — Other Ambulatory Visit: Payer: Self-pay | Admitting: Family Medicine

## 2015-09-14 MED ORDER — TRAMADOL HCL 50 MG PO TABS: ORAL_TABLET | ORAL | Status: DC

## 2015-09-15 ENCOUNTER — Emergency Department: Payer: BC Managed Care – PPO

## 2015-09-15 ENCOUNTER — Emergency Department
Admission: EM | Admit: 2015-09-15 | Discharge: 2015-09-16 | Disposition: A | Discharge status: Home / Self Care | Payer: BC Managed Care – PPO | Attending: Emergency Medicine | Admitting: Emergency Medicine

## 2015-09-15 DIAGNOSIS — I10 Essential (primary) hypertension: Secondary | ICD-10-CM | POA: Diagnosis not present

## 2015-09-15 DIAGNOSIS — R0602 Shortness of breath: Secondary | ICD-10-CM | POA: Diagnosis present

## 2015-09-15 DIAGNOSIS — Z91040 Latex allergy status: Secondary | ICD-10-CM | POA: Insufficient documentation

## 2015-09-15 DIAGNOSIS — M5136 Other intervertebral disc degeneration, lumbar region: Secondary | ICD-10-CM | POA: Insufficient documentation

## 2015-09-15 DIAGNOSIS — Z79899 Other long term (current) drug therapy: Secondary | ICD-10-CM | POA: Insufficient documentation

## 2015-09-15 DIAGNOSIS — R600 Localized edema: Secondary | ICD-10-CM | POA: Diagnosis not present

## 2015-09-15 DIAGNOSIS — Z87891 Personal history of nicotine dependence: Secondary | ICD-10-CM | POA: Diagnosis not present

## 2015-09-15 DIAGNOSIS — R0600 Dyspnea, unspecified: Secondary | ICD-10-CM

## 2015-09-15 DIAGNOSIS — E785 Hyperlipidemia, unspecified: Secondary | ICD-10-CM | POA: Diagnosis not present

## 2015-09-15 DIAGNOSIS — R5383 Other fatigue: Secondary | ICD-10-CM | POA: Diagnosis not present

## 2015-09-15 DIAGNOSIS — Z8673 Personal history of transient ischemic attack (TIA), and cerebral infarction without residual deficits: Secondary | ICD-10-CM | POA: Insufficient documentation

## 2015-09-15 DIAGNOSIS — R609 Edema, unspecified: Secondary | ICD-10-CM

## 2015-09-15 LAB — BASIC METABOLIC PANEL
ANION GAP: 8 (ref 5–15)
BUN: 19 mg/dL (ref 6–20)
CHLORIDE: 101 mmol/L (ref 101–111)
CO2: 31 mmol/L (ref 22–32)
Calcium: 9 mg/dL (ref 8.9–10.3)
Creatinine, Ser: 1.01 mg/dL — ABNORMAL HIGH (ref 0.44–1.00)
GFR calc Af Amer: >60 mL/min (ref >60)
GFR calc non Af Amer: >60 mL/min (ref >60)
GLUCOSE: 117 mg/dL — AB (ref 65–99)
POTASSIUM: 3.6 mmol/L (ref 3.5–5.1)
Sodium: 140 mmol/L (ref 135–145)

## 2015-09-15 LAB — CBC
HEMATOCRIT: 38.8 % (ref 35.0–47.0)
HEMOGLOBIN: 13.4 g/dL (ref 12.0–16.0)
MCH: 30.6 pg (ref 26.0–34.0)
MCHC: 34.7 g/dL (ref 32.0–36.0)
MCV: 88.3 fL (ref 80.0–100.0)
Platelets: 229 10*3/uL (ref 150–440)
RBC: 4.39 MIL/uL (ref 3.80–5.20)
RDW: 13.4 % (ref 11.5–14.5)
WBC: 6.5 10*3/uL (ref 3.6–11.0)

## 2015-09-15 LAB — URINALYSIS COMPLETE WITH MICROSCOPIC (ARMC ONLY)
Bilirubin Urine: NEGATIVE
Glucose, UA: NEGATIVE mg/dL
Hgb urine dipstick: NEGATIVE
Ketones, ur: NEGATIVE mg/dL
LEUKOCYTES UA: NEGATIVE
Nitrite: NEGATIVE
PH: 6 (ref 5.0–8.0)
PROTEIN: NEGATIVE mg/dL
SPECIFIC GRAVITY, URINE: 1.004 — AB (ref 1.005–1.030)

## 2015-09-15 LAB — TROPONIN I: Troponin I: <0.03 ng/mL (ref <0.031)

## 2015-09-15 NOTE — ED Notes (Signed)
Patient ambulatory to triage with steady gait, without difficulty or distress noted; pt reports x 2wks "feeling tired, hard time catching my breath, pain under my right breast radiating into back with swelling to legs"; denies hx of same

## 2015-09-16 LAB — BLOOD GAS, VENOUS
ACID-BASE EXCESS: 5.9 mmol/L — AB (ref 0.0–3.0)
BICARBONATE: 31.7 meq/L — AB (ref 21.0–28.0)
FIO2: 0.21
O2 Saturation: 89 %
PATIENT TEMPERATURE: 37
pCO2, Ven: 50 mmHg (ref 44.0–60.0)
pH, Ven: 7.41 (ref 7.320–7.430)
pO2, Ven: 56 mmHg — ABNORMAL HIGH (ref 31.0–45.0)

## 2015-09-16 LAB — HEPATIC FUNCTION PANEL
ALT: 20 U/L (ref 14–54)
AST: 24 U/L (ref 15–41)
Albumin: 4.3 g/dL (ref 3.5–5.0)
Alkaline Phosphatase: 62 U/L (ref 38–126)
TOTAL PROTEIN: 6.9 g/dL (ref 6.5–8.1)
Total Bilirubin: 0.5 mg/dL (ref 0.3–1.2)

## 2015-09-16 LAB — TSH: TSH: 1.364 u[IU]/mL (ref 0.350–4.500)

## 2015-09-16 LAB — BRAIN NATRIURETIC PEPTIDE: B Natriuretic Peptide: 14 pg/mL (ref 0.0–100.0)

## 2015-09-16 LAB — MONONUCLEOSIS SCREEN: Mono Screen: NEGATIVE

## 2015-09-16 MED ORDER — DIPHENHYDRAMINE HCL 50 MG/ML IJ SOLN
25.0000 mg | Freq: Once | INTRAMUSCULAR | Status: AC
  Administered 2015-09-16: 25 mg via INTRAVENOUS
  Filled 2015-09-16: qty 1

## 2015-09-16 MED ORDER — METOCLOPRAMIDE HCL 5 MG/ML IJ SOLN
10.0000 mg | Freq: Once | INTRAMUSCULAR | Status: AC
  Administered 2015-09-16: 10 mg via INTRAVENOUS
  Filled 2015-09-16: qty 2

## 2015-09-16 MED ORDER — KETOROLAC TROMETHAMINE 30 MG/ML IJ SOLN
30.0000 mg | Freq: Once | INTRAMUSCULAR | Status: AC
  Administered 2015-09-16: 30 mg via INTRAVENOUS
  Filled 2015-09-16: qty 1

## 2015-09-16 MED ORDER — SODIUM CHLORIDE 0.9 % IV BOLUS (SEPSIS): 1000.0000 mL | Freq: Once | INTRAVENOUS | Status: DC

## 2015-09-16 NOTE — Discharge Instructions (Signed)
Edema °Edema is an abnormal buildup of fluids in your body tissues. Edema is somewhat dependent on gravity to pull the fluid to the lowest place in your body. That makes the condition more common in the legs and thighs (lower extremities). Painless swelling of the feet and ankles is common and becomes more likely as you get older. It is also common in looser tissues, like around your eyes.  °When the affected area is squeezed, the fluid may move out of that spot and leave a dent for a few moments. This dent is called pitting.  °CAUSES  °There are many possible causes of edema. Eating too much salt and being on your feet or sitting for a long time can cause edema in your legs and ankles. Hot weather may make edema worse. Common medical causes of edema include: °· Heart failure. °· Liver disease. °· Kidney disease. °· Weak blood vessels in your legs. °· Cancer. °· An injury. °· Pregnancy. °· Some medications. °· Obesity.  °SYMPTOMS  °Edema is usually painless. Your skin may look swollen or shiny.  °DIAGNOSIS  °Your health care provider may be able to diagnose edema by asking about your medical history and doing a physical exam. You may need to have tests such as X-rays, an electrocardiogram, or blood tests to check for medical conditions that may cause edema.  °TREATMENT  °Edema treatment depends on the cause. If you have heart, liver, or kidney disease, you need the treatment appropriate for these conditions. General treatment may include: °· Elevation of the affected body part above the level of your heart. °· Compression of the affected body part. Pressure from elastic bandages or support stockings squeezes the tissues and forces fluid back into the blood vessels. This keeps fluid from entering the tissues. °· Restriction of fluid and salt intake. °· Use of a water pill (diuretic). These medications are appropriate only for some types of edema. They pull fluid out of your body and make you urinate more often. This  gets rid of fluid and reduces swelling, but diuretics can have side effects. Only use diuretics as directed by your health care provider. °HOME CARE INSTRUCTIONS  °· Keep the affected body part above the level of your heart when you are lying down.   °· Do not sit still or stand for prolonged periods.   °· Do not put anything directly under your knees when lying down. °· Do not wear constricting clothing or garters on your upper legs.   °· Exercise your legs to work the fluid back into your blood vessels. This may help the swelling go down.   °· Wear elastic bandages or support stockings to reduce ankle swelling as directed by your health care provider.   °· Eat a low-salt diet to reduce fluid if your health care provider recommends it.   °· Only take medicines as directed by your health care provider.  °SEEK MEDICAL CARE IF:  °· Your edema is not responding to treatment. °· You have heart, liver, or kidney disease and notice symptoms of edema. °· You have edema in your legs that does not improve after elevating them.   °· You have sudden and unexplained weight gain. °SEEK IMMEDIATE MEDICAL CARE IF:  °· You develop shortness of breath or chest pain.   °· You cannot breathe when you lie down. °· You develop pain, redness, or warmth in the swollen areas.   °· You have heart, liver, or kidney disease and suddenly get edema. °· You have a fever and your symptoms suddenly get worse. °MAKE SURE YOU:  °·   Understand these instructions.  Will watch your condition.  Will get help right away if you are not doing well or get worse.   This information is not intended to replace advice given to you by your health care provider. Make sure you discuss any questions you have with your health care provider.   Document Released: 05/16/2005 Document Revised: 06/06/2014 Document Reviewed: 03/08/2013 Elsevier Interactive Patient Education 2016 [employer].  Fatigue Fatigue is feeling tired all of the time, a lack of energy,  or a lack of motivation. Occasional or mild fatigue is often a normal response to activity or life in general. However, long-lasting (chronic) or extreme fatigue may indicate an underlying medical condition. HOME CARE INSTRUCTIONS  Watch your fatigue for any changes. The following actions may help to lessen any discomfort you are feeling:  Talk to your health care provider about how much sleep you need each night. Try to get the required amount every night.  Take medicines only as directed by your health care provider.  Eat a healthy and nutritious diet. Ask your health care provider if you need help changing your diet.  Drink enough fluid to keep your urine clear or pale yellow.  Practice ways of relaxing, such as yoga, meditation, massage therapy, or acupuncture.  Exercise regularly.   Change situations that cause you stress. Try to keep your work and personal routine reasonable.  Do not abuse illegal drugs.  Limit alcohol intake to no more than 1 drink per day for nonpregnant women and 2 drinks per day for men. One drink equals 12 ounces of beer, 5 ounces of wine, or 1 ounces of hard liquor.  Take a multivitamin, if directed by your health care provider. SEEK MEDICAL CARE IF:   Your fatigue does not get better.  You have a fever.   You have unintentional weight loss or gain.  You have headaches.   You have difficulty:   Falling asleep.  Sleeping throughout the night.  You feel angry, guilty, anxious, or sad.   You are unable to have a bowel movement (constipation).   You skin is dry.   Your legs or another part of your body is swollen.  SEEK IMMEDIATE MEDICAL CARE IF:   You feel confused.   Your vision is blurry.  You feel faint or pass out.   You have a severe headache.   You have severe abdominal, pelvic, or back pain.   You have chest pain, shortness of breath, or an irregular or fast heartbeat.   You are unable to urinate or you  urinate less than normal.   You develop abnormal bleeding, such as bleeding from the rectum, vagina, nose, lungs, or nipples.  You vomit blood.   You have thoughts about harming yourself or committing suicide.   You are worried that you might harm someone else.    This information is not intended to replace advice given to you by your health care provider. Make sure you discuss any questions you have with your health care provider.   Document Released: 03/13/2007 Document Revised: 06/06/2014 Document Reviewed: 09/17/2013 Elsevier Interactive Patient Education 2016 [employer] Shortness of breath means you have trouble breathing. It could also mean that you have a medical problem. You should get immediate medical care for shortness of breath. CAUSES   Not enough oxygen in the air such as with high altitudes or a smoke-filled room.  Certain lung diseases, infections, or problems.  Heart disease or  conditions, such as angina or heart failure.  Low red blood cells (anemia).  Poor physical fitness, which can cause shortness of breath when you exercise.  Chest or back injuries or stiffness.  Being overweight.  Smoking.  Anxiety, which can make you feel like you are not getting enough air. DIAGNOSIS  Serious medical problems can often be found during your physical exam. Tests may also be done to determine why you are having shortness of breath. Tests may include:  Chest X-rays.  Lung function tests.  Blood tests.  An electrocardiogram (ECG).  An ambulatory electrocardiogram. An ambulatory ECG records your heartbeat patterns over a 24-hour period.  Exercise testing.  A transthoracic echocardiogram (TTE). During echocardiography, sound waves are used to evaluate how blood flows through your heart.  A transesophageal echocardiogram (TEE).  Imaging scans. Your health care provider may not be able to find a cause for your shortness of breath  after your exam. In this case, it is important to have a follow-up exam with your health care provider as directed.  TREATMENT  Treatment for shortness of breath depends on the cause of your symptoms and can vary greatly. HOME CARE INSTRUCTIONS   Do not smoke. Smoking is a common cause of shortness of breath. If you smoke, ask for help to quit.  Avoid being around chemicals or things that may bother your breathing, such as paint fumes and dust.  Rest as needed. Slowly resume your usual activities.  If medicines were prescribed, take them as directed for the full length of time directed. This includes oxygen and any inhaled medicines.  Keep all follow-up appointments as directed by your health care provider. SEEK MEDICAL CARE IF:   Your condition does not improve in the time expected.  You have a hard time doing your normal activities even with rest.  You have any new symptoms. SEEK IMMEDIATE MEDICAL CARE IF:   Your shortness of breath gets worse.  You feel light-headed, faint, or develop a cough not controlled with medicines.  You start coughing up blood.  You have pain with breathing.  You have chest pain or pain in your arms, shoulders, or abdomen.  You have a fever.  You are unable to walk up stairs or exercise the way you normally do. MAKE SURE YOU:  Understand these instructions.  Will watch your condition.  Will get help right away if you are not doing well or get worse.   This information is not intended to replace advice given to you by your health care provider. Make sure you discuss any questions you have with your health care provider.   Document Released: 02/08/2001 Document Revised: 05/21/2013 Document Reviewed: 08/01/2011 Elsevier Interactive Patient Education [employer].

## 2015-09-16 NOTE — ED Provider Notes (Signed)
[facility] Emergency Department Provider Note  ____________________________________________  Time seen: Approximately 2352 PM  I have reviewed the triage vital signs and the nursing notes.   HISTORY  Chief Complaint Shortness of Breath; Weakness; and Leg Swelling    HPI Shirley Rogers is a 50 y.o. female comes in today with multiple complaints. The patient reports that she's been having 2 weeks of fatigue and no energy. She is also having some pain in her sides going from the front to the back. She reports she is up 4-5 times a night going to the bathroom and never filled that she's emptying. Today the patient's feet and ankle were swollen after school. She's had some shortness of breath with wheezing for the past 1-1/2-2 weeks that's worse with walking. The patient has had lupus in the past but was told that it was not a correct diagnosis. The patient had a positive ANA and has been seeing a rheumatologist. She also had a thyroid biopsy recently and a cholecystectomy. The patient reports that she was treated for lupus for 8 years and was on steroids for 2 of those years. She reports that sometime she does have weird symptoms which is why she is evaluated. Her next appointment with rheumatologist as an May. The patient feels as though she has mono like she did previously. She reports that she smokes been sleeping all day. She is also complaining of a headache which developed while she was in the waiting room. 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   Past Medical History  Diagnosis Date  . Barrett esophagus   . Diverticulosis   . Concussion     1974,1982,1987,2016,2016  . 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 Family history of adverse reaction to  anesthesia     father - slow to wake  . Hypertension     no meds currently.  . Stroke ([place])     2000 - no deficits  . Hemiplegic migraine     daily  . Migraine   . GERD (gastroesophageal reflux disease)   . Vertigo     from concussive disorder.  seeing neuro 03/23/15  . Sleep apnea     CPAP machine broken  . Vascular spasm ([place])   . Complicated migraine     with facial drooping  . DDD (degenerative disc disease), lumbar     MRI 2008, L3-4, L5-S1, spondylotic changes, multilevel facet joint hypertrophic changes  . Recurrent sinusitis   . Recurrent UTI     question about IC in the past  . Depression     on paxil, lexapro, cymbalta in the past  . Anxiety     on xanax in the past  . IBS (irritable bowel syndrome)   . Fibrocystic breast disease   . Hyperlipidemia   . Colon polyp   . Hemorrhoids   . ASCVD (arteriosclerotic cardiovascular disease)   . Insomnia chronic  . Fatty liver 2013    On CT abdomen/pelvis  . Kidney stone 2013    on CT abdomen/pelvis  . Umbilical hernia 0000000    on CT abdomen/pelvis  . Normal cardiac stress test 2009    EF 66%  . Narrowing of intervertebral disc space 2011    C5-6  .  Carotid atherosclerosis 2003    on ultrasound  . Dizziness due to old head injury   . Diverticulosis     Patient Active Problem List   Diagnosis Date Noted  . Postconcussion syndrome 06/10/2015  . Snoring 06/10/2015  . OSA on CPAP 06/10/2015  . 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 Globus sensation 03/30/2015  . Post-concussion syndrome 03/13/2015  . Depression 03/13/2015  . Hypertension   . Barrett esophagus   . Diverticulosis   . Hemiplegic migraine   . Lupus ([place])   . Stroke [facility])     Past Surgical History  Procedure Laterality Date  . Tubal ligation    . Breast surgery Right     atypical hyperplasia  . Colonoscopy w/ biopsies      Removed 5 polyps  . Biopsy punch thyroid    . Cholecystectomy  N/A 07/24/2015    Procedure: LAPAROSCOPIC CHOLECYSTECTOMY WITH INTRAOPERATIVE CHOLANGIOGRAM;  Surgeon: Leonie Green, MD;  Location: [facility] ORS;  Service: General;  Laterality: N/A;    Current Outpatient Rx  Name  Route  Sig  Dispense  Refill  . amoxicillin-clavulanate (AUGMENTIN) 875-125 MG tablet   Oral   Take 1 tablet by mouth 2 (two) times daily.   20 tablet   0   . busPIRone (BUSPAR) 5 MG tablet      TAKE 1 TO 3 TABLETS BY MOUTH UP TO THREE TIMES DAILY AS NEEDED   810 tablet   0     **Patient requests 90 days supply**   . citalopram (CELEXA) 20 MG tablet      TAKE 1 AND 1/2 TABLETS(30 MG) BY MOUTH DAILY   45 tablet   1   . gabapentin (NEURONTIN) 300 MG capsule   Oral   Take 300 mg by mouth 3 (three) times daily.          . magnesium oxide (MAG-OX) 400 MG tablet   Oral   Take 1,200 mg by mouth daily. Reported on 05/14/2015         . metoCLOPramide (REGLAN) 10 MG tablet   Oral   Take 1 tablet (10 mg total) by mouth every 6 (six) hours as needed for nausea.   20 tablet   0   . naproxen sodium (ANAPROX) 220 MG tablet   Oral   Take 660 mg by mouth 2 (two) times daily as needed (for pain/headaches). Reported on 06/10/2015         . omeprazole (PRILOSEC) 40 MG capsule   Oral   Take 40 mg by mouth daily.         Marland Kitchen PROAIR HFA 108 (90 BASE) MCG/ACT inhaler      INHALE 2 PUFFS PO Q 4 H PRF WHEEZING   18 g   3     Dispense as written.   . traMADol (ULTRAM) 50 MG tablet      TAKE 1 TABLET BY MOUTH EVERY 8 HOURS AS NEEDED.   84 tablet   0   . traZODone (DESYREL) 100 MG tablet   Oral   Take 1 tablet (100 mg total) by mouth at bedtime as needed for sleep.   90 tablet   1   . verapamil (VERELAN PM) 240 MG 24 hr capsule   Oral   Take 240 mg by mouth at bedtime.           Allergies Rocephin; Doxycycline; Ace inhibitors; Compazine; Morphine and related; Other;  Prochlorperazine edisylate; Sulfa antibiotics; Zocor; Biaxin; Erythromycin; and  Latex  Family History  Problem Relation Age of Onset  . Other Mother     IBS  . Heart disease Father   . Diverticulosis Father   . Other Son     Ulcerative Colitis, Polonidal cyst  . Parkinson's disease Maternal Grandmother   . Cancer Maternal Grandfather     Bladder, Prostate  . Mental illness Paternal Grandmother   . Stroke Paternal Grandmother   . Migraines Father   . Cancer Maternal Grandmother   . Stroke Maternal Grandfather   . Macular degeneration    . Cancer Maternal Grandfather     bladder  . Cancer Maternal Grandfather     prostate  . Cancer Paternal Grandmother     brain cancer  . Bipolar disorder Paternal Grandmother   . Heart disease Paternal Grandfather   . Heart attack Paternal Grandfather   . Hypertension Mother   . Diabetes Brother     Social History Social History  Substance Use Topics  . Smoking status: Former Smoker -- 0.25 packs/day for 1 years    Types: Cigarettes    Quit date: 06/16/2015  . Smokeless tobacco: Never Used     Comment: 1-2 cigarettes approx 3 x/wk  . Alcohol Use: 0.0 oz/week    0 Standard drinks or equivalent per week     Comment: social    Review of Systems Constitutional: Fatigue Eyes: No visual changes. ENT: No sore throat. Cardiovascular: Denies chest pain. Respiratory:  shortness of breath. Gastrointestinal:  abdominal pain.  No nausea, no vomiting.  No diarrhea.  No constipation. Genitourinary: Polyuria Musculoskeletal: Negative for back pain. Skin: Negative for rash. Neurological: Negative for headaches, focal weakness or numbness. Lymph: Swelling to the feet and ankles.  10-point ROS otherwise negative.  ____________________________________________   PHYSICAL EXAM:  VITAL SIGNS: ED Triage Vitals  Enc Vitals Group     BP 09/15/15 1906 171/91 mmHg     Pulse Rate 09/15/15 1906 87     Resp 09/15/15 1906 20     Temp 09/15/15 1906 98.3 F (36.8 C)     Temp Source 09/15/15 1906 Oral     SpO2 09/15/15 1906  97 %     Weight 09/15/15 1906 243 lb (110.224 kg)     Height 09/15/15 1906 5\' 5"  (1.651 m)     Head Cir --      Peak Flow --      Pain Score 09/15/15 1906 8     Pain Loc --      Pain Edu? --      Excl. in [place]? --     Constitutional: Alert and oriented. Well appearing and in Mild distress Eyes: Conjunctivae are normal. PERRL. EOMI. Head: Atraumatic. Nose: No congestion/rhinnorhea. Mouth/Throat: Mucous membranes are moist.  Oropharynx non-erythematous. Cardiovascular: Normal rate, regular rhythm. Grossly normal heart sounds.  Good peripheral circulation. Respiratory: Normal respiratory effort.  No retractions. Lungs CTAB. Gastrointestinal: Soft with minimal left-sided tenderness to palpation. No distention. Positive bowel sounds Musculoskeletal: No lower extremity tenderness nor edema.   Neurologic:  Normal speech and language. Patient has some right-sided facial droop as well as some right upper extremity weakness which she reports is residual from her previous stroke and consistent with her hemiplegic migraines. Skin:  Skin is warm, dry and intact.  Psychiatric: Mood and affect are normal.   ____________________________________________   LABS (all labs ordered are listed, but only abnormal results are displayed)  Labs Reviewed  BASIC METABOLIC PANEL - Abnormal; Notable for the following:    Glucose, Bld 117 (*)    Creatinine, Ser 1.01 (*)    All other components within normal limits  URINALYSIS COMPLETEWITH MICROSCOPIC ([facility] ONLY) - Abnormal; Notable for the following:    Color, Urine STRAW (*)    APPearance CLEAR (*)    Specific Gravity, Urine 1.004 (*)    Bacteria, UA RARE (*)    Squamous Epithelial / LPF 0-5 (*)    All other components within normal limits  BLOOD GAS, VENOUS - Abnormal; Notable for the following:    pO2, Ven 56.0 (*)    Bicarbonate 31.7 (*)    Acid-Base Excess 5.9 (*)    All other components within normal limits  HEPATIC FUNCTION PANEL - Abnormal;  Notable for the following:    Bilirubin, Direct <0.1 (*)    All other components within normal limits  CBC  TROPONIN I  TSH  BRAIN NATRIURETIC PEPTIDE  MONONUCLEOSIS SCREEN   ____________________________________________  EKG  ED ECG REPORT I, Loney Hering, the attending physician, personally viewed and interpreted this ECG.   Date: 09/15/2015  EKG Time: 1910  Rate: 84  Rhythm: normal sinus rhythm  Axis: normal  Intervals:none  ST&T Change: none  ____________________________________________  RADIOLOGY  Chest x-ray: No acute process ____________________________________________   PROCEDURES  Procedure(s) performed: None  Critical Care performed: No  ____________________________________________   INITIAL IMPRESSION / ASSESSMENT AND PLAN / ED COURSE  Pertinent labs & imaging results that were available during my care of the patient were reviewed by me and considered in my medical decision making (see chart for details).  This is a 50 year old female who comes into the hospital today with multiple complaints including fatigue and decreased energy intermittent shortness of breath with exertion for urination and swelling of her feet and ankles. The patient does have some rheumatologic disease and possibility of lupus with a positive ANA and is currently being evaluated by rheumatologist. We did check the patient's blood work to include CBC CMP as well as a Monospot urinalysis chest x-ray and heart failure enzymes. Although the patient's blood work was unremarkable. The patient did have a headache so I did give her some Toradol, Benadryl and Reglan. The patient's headache was improved as well as her facial droop which is worsened when she has a headache. At this time I informed the patient that I am unsure what causing her symptoms but feel like she needs further evaluation with her rheumatologist as these persistent symptoms may be part of her rheumatologic disease. The  patient was frustrated that I was not giving her medication for swelling to her legs but it did inform her that since it was 1 day she should work on elevating her legs as well as cutting salt out of her diet which may be contributing to the swelling. The patient be discharged home to follow-up with her primary care physician and her rheumatologist. ____________________________________________   FINAL CLINICAL IMPRESSION(S) / ED DIAGNOSES  Final diagnoses:  Other fatigue  Dyspnea  Peripheral edema      Loney Hering, MD 09/16/15 [id number]

## 2015-09-17 ENCOUNTER — Telehealth: Payer: Self-pay | Admitting: Neurology

## 2015-09-17 ENCOUNTER — Encounter (INDEPENDENT_AMBULATORY_CARE_PROVIDER_SITE_OTHER): Payer: Self-pay | Admitting: Diagnostic Neuroimaging

## 2015-09-17 ENCOUNTER — Ambulatory Visit (INDEPENDENT_AMBULATORY_CARE_PROVIDER_SITE_OTHER): Payer: BC Managed Care – PPO | Admitting: Diagnostic Neuroimaging

## 2015-09-17 DIAGNOSIS — R208 Other disturbances of skin sensation: Secondary | ICD-10-CM | POA: Diagnosis not present

## 2015-09-17 DIAGNOSIS — Z0289 Encounter for other administrative examinations: Secondary | ICD-10-CM

## 2015-09-17 DIAGNOSIS — R29898 Other symptoms and signs involving the musculoskeletal system: Secondary | ICD-10-CM

## 2015-09-17 DIAGNOSIS — R200 Anesthesia of skin: Secondary | ICD-10-CM

## 2015-09-17 DIAGNOSIS — M352 Behcet's disease: Secondary | ICD-10-CM

## 2015-09-17 NOTE — Procedures (Signed)
   [facility]  NCS (NERVE CONDUCTION STUDY) WITH EMG (ELECTROMYOGRAPHY) REPORT   STUDY DATE: 09/17/15 PATIENT NAME: Shirley Rogers DOB: 07-17-1965 MRN: [id number]  ORDERING CLINICIAN: Andrey Spearman, MD   TECHNOLOGIST: Laretta Alstrom  ELECTROMYOGRAPHER: Earlean Polka. Penumalli, MD  CLINICAL INFORMATION: 50 year old female with left hand numbness and weakness. Evaluate for carpal tunnel syndrome versus left cervical radiculopathy.  FINDINGS: NERVE CONDUCTION STUDY: Bilateral median and ulnar motor responses and F wave latencies are normal. Bilateral median and ulnar sensory responses are normal. Bilateral median and ulnar transcarpal mixed nerve responses are normal.   NEEDLE ELECTROMYOGRAPHY: Needle examination of left upper extremity deltoid, biceps, triceps, flexor carpi radialis, first dorsal interosseous and left C5-6 and left C7-T1 paraspinal muscles is normal.   IMPRESSION:  This is a normal study. No electrodiagnostic evidence of large fiber neuropathy, carpal tunnel syndrome, cervical radiculopathy at this time.    INTERPRETING PHYSICIAN:  Penni Bombard, MD Certified in Neurology, Neurophysiology and Neuroimaging  [facility] [address] [place], [place] [address] [phone]

## 2015-09-17 NOTE — Telephone Encounter (Signed)
Patient wants to schedule her sleep study and I need an order.  I can't see one in the system to attach.  The chart says split.  Thanks

## 2015-09-17 NOTE — Telephone Encounter (Signed)
Order was placed for a split night and capnograpy by Dr. Brett Fairy on 08/11/2015. The order for split night is attached to the pt's split night appt on 10/07/15 at 8:00.  Please let me know if you have further difficulty with this order, perhaps IT could help [place]?

## 2015-09-18 ENCOUNTER — Emergency Department
Admission: EM | Admit: 2015-09-18 | Discharge: 2015-09-18 | Disposition: A | Discharge status: Home / Self Care | Payer: BC Managed Care – PPO | Attending: Emergency Medicine | Admitting: Emergency Medicine

## 2015-09-18 ENCOUNTER — Ambulatory Visit: Payer: BC Managed Care – PPO | Admitting: Family Medicine

## 2015-09-18 ENCOUNTER — Emergency Department: Payer: BC Managed Care – PPO

## 2015-09-18 DIAGNOSIS — F329 Major depressive disorder, single episode, unspecified: Secondary | ICD-10-CM | POA: Insufficient documentation

## 2015-09-18 DIAGNOSIS — Z791 Long term (current) use of non-steroidal anti-inflammatories (NSAID): Secondary | ICD-10-CM | POA: Diagnosis not present

## 2015-09-18 DIAGNOSIS — M5136 Other intervertebral disc degeneration, lumbar region: Secondary | ICD-10-CM | POA: Diagnosis not present

## 2015-09-18 DIAGNOSIS — E785 Hyperlipidemia, unspecified: Secondary | ICD-10-CM | POA: Diagnosis not present

## 2015-09-18 DIAGNOSIS — R42 Dizziness and giddiness: Secondary | ICD-10-CM | POA: Diagnosis present

## 2015-09-18 DIAGNOSIS — G43409 Hemiplegic migraine, not intractable, without status migrainosus: Secondary | ICD-10-CM

## 2015-09-18 DIAGNOSIS — Z87891 Personal history of nicotine dependence: Secondary | ICD-10-CM | POA: Insufficient documentation

## 2015-09-18 DIAGNOSIS — Z792 Long term (current) use of antibiotics: Secondary | ICD-10-CM | POA: Insufficient documentation

## 2015-09-18 DIAGNOSIS — Z79899 Other long term (current) drug therapy: Secondary | ICD-10-CM | POA: Insufficient documentation

## 2015-09-18 DIAGNOSIS — Z8673 Personal history of transient ischemic attack (TIA), and cerebral infarction without residual deficits: Secondary | ICD-10-CM | POA: Diagnosis not present

## 2015-09-18 DIAGNOSIS — I10 Essential (primary) hypertension: Secondary | ICD-10-CM | POA: Diagnosis not present

## 2015-09-18 LAB — BASIC METABOLIC PANEL
Anion gap: 8 (ref 5–15)
BUN: 16 mg/dL (ref 6–20)
CO2: 30 mmol/L (ref 22–32)
Calcium: 9.3 mg/dL (ref 8.9–10.3)
Chloride: 101 mmol/L (ref 101–111)
Creatinine, Ser: 0.82 mg/dL (ref 0.44–1.00)
GFR calc Af Amer: >60 mL/min (ref >60)
GLUCOSE: 141 mg/dL — AB (ref 65–99)
POTASSIUM: 4 mmol/L (ref 3.5–5.1)
Sodium: 139 mmol/L (ref 135–145)

## 2015-09-18 LAB — CBC
HEMATOCRIT: 41.9 % (ref 35.0–47.0)
Hemoglobin: 14 g/dL (ref 12.0–16.0)
MCH: 29.7 pg (ref 26.0–34.0)
MCHC: 33.4 g/dL (ref 32.0–36.0)
MCV: 88.9 fL (ref 80.0–100.0)
Platelets: 208 10*3/uL (ref 150–440)
RBC: 4.72 MIL/uL (ref 3.80–5.20)
RDW: 13.3 % (ref 11.5–14.5)
WBC: 5.5 10*3/uL (ref 3.6–11.0)

## 2015-09-18 LAB — TROPONIN I: Troponin I: <0.03 ng/mL (ref <0.031)

## 2015-09-18 MED ORDER — METOCLOPRAMIDE HCL 5 MG/ML IJ SOLN
10.0000 mg | Freq: Once | INTRAMUSCULAR | Status: AC
  Administered 2015-09-18: 10 mg via INTRAVENOUS
  Filled 2015-09-18: qty 2

## 2015-09-18 MED ORDER — DIPHENHYDRAMINE HCL 50 MG/ML IJ SOLN
25.0000 mg | Freq: Once | INTRAMUSCULAR | Status: AC
  Administered 2015-09-18: 25 mg via INTRAVENOUS
  Filled 2015-09-18: qty 1

## 2015-09-18 MED ORDER — KETOROLAC TROMETHAMINE 30 MG/ML IJ SOLN
30.0000 mg | Freq: Once | INTRAMUSCULAR | Status: AC
  Administered 2015-09-18: 30 mg via INTRAVENOUS
  Filled 2015-09-18: qty 1

## 2015-09-18 MED ORDER — SODIUM CHLORIDE 0.9 % IV BOLUS (SEPSIS)
1000.0000 mL | Freq: Once | INTRAVENOUS | Status: AC
  Administered 2015-09-18: 1000 mL via INTRAVENOUS

## 2015-09-18 NOTE — ED Notes (Signed)
Pt c/o dizziness, with HA, chest pain, facial droop, pt has a hx of hemiplegic migraines, these sx are similar but not the chest pain.. States she was just seen her a couple of days ago with similar sx that improved and then returned today.the patient is a/ox3 on arrival..

## 2015-09-18 NOTE — ED Provider Notes (Signed)
[facility] Emergency Department Provider Note  ____________________________________________  Time seen: Approximately 5:38 PM  I have reviewed the triage vital signs and the nursing notes.   HISTORY  Chief Complaint Dizziness and Chest Pain    HPI Shirley Rogers is a 50 y.o. female reports that around 10 AM this morning she began feeling lightheaded headache, some dizziness, and feeling tired. She went to the school nurse's office, they called medics however she did not wish to come, but rather had a friend drive her to the hospital.  She reports that she started developing a headache which she describes as left sided throbbing "migraine" which she reports she has up to 6 times a month with associated weakness in her right hand and left face. She reports to me a history of "hemiplegic migraines" which she can have up to 6 times monthly that of the same. She came in today because she continues to feel fatigued and weak. She did not take any medicine prior to arrival.  She apparently had mentioned to triage that she was having chest pain and shortness of breath, but she denies these symptoms to me at this time. She denies any fevers or chills.  She has very clear that she has these migraines on a regular basis and her symptoms of left facial weakness R hand and concur several times a month the same. She tells me she is to get a "migraine cocktail including Toradol, Reglan, and Benadryl" and this usually takes care of it.   Past Medical History  Diagnosis Date  . Barrett esophagus   . Diverticulosis   . Concussion     1974,1982,1987,2016,2016  . 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 Family history of adverse reaction to anesthesia     father - slow to wake  . Hypertension     no meds currently.  . Stroke ([place])     2000 - no deficits  . Hemiplegic migraine     daily  . Migraine   . GERD  (gastroesophageal reflux disease)   . Vertigo     from concussive disorder.  seeing neuro 03/23/15  . Sleep apnea     CPAP machine broken  . Vascular spasm ([place])   . Complicated migraine     with facial drooping  . DDD (degenerative disc disease), lumbar     MRI 2008, L3-4, L5-S1, spondylotic changes, multilevel facet joint hypertrophic changes  . Recurrent sinusitis   . Recurrent UTI     question about IC in the past  . Depression     on paxil, lexapro, cymbalta in the past  . Anxiety     on xanax in the past  . IBS (irritable bowel syndrome)   . Fibrocystic breast disease   . Hyperlipidemia   . Colon polyp   . Hemorrhoids   . ASCVD (arteriosclerotic cardiovascular disease)   . Insomnia chronic  . Fatty liver 2013    On CT abdomen/pelvis  . Kidney stone 2013    on CT abdomen/pelvis  . Umbilical hernia 0000000    on CT abdomen/pelvis  . Normal cardiac stress test 2009    EF 66%  . Narrowing of intervertebral disc space 2011    C5-6  . Carotid atherosclerosis 2003    on ultrasound  . Dizziness due to old head injury   . Diverticulosis     Patient Active Problem  List   Diagnosis Date Noted  . Postconcussion syndrome 06/10/2015  . Snoring 06/10/2015  . OSA on CPAP 06/10/2015  . 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 Globus sensation 03/30/2015  . Post-concussion syndrome 03/13/2015  . Depression 03/13/2015  . Hypertension   . Barrett esophagus   . Diverticulosis   . Hemiplegic migraine   . Lupus ([place])   . Stroke [facility])     Past Surgical History  Procedure Laterality Date  . Tubal ligation    . Breast surgery Right     atypical hyperplasia  . Colonoscopy w/ biopsies      Removed 5 polyps  . Biopsy punch thyroid    . Cholecystectomy N/A 07/24/2015    Procedure: LAPAROSCOPIC CHOLECYSTECTOMY WITH INTRAOPERATIVE CHOLANGIOGRAM;  Surgeon: Leonie Green, MD;  Location: [facility] ORS;  Service: General;   Laterality: N/A;    Current Outpatient Rx  Name  Route  Sig  Dispense  Refill  . amoxicillin-clavulanate (AUGMENTIN) 875-125 MG tablet   Oral   Take 1 tablet by mouth 2 (two) times daily.   20 tablet   0   . busPIRone (BUSPAR) 5 MG tablet      TAKE 1 TO 3 TABLETS BY MOUTH UP TO THREE TIMES DAILY AS NEEDED   810 tablet   0     **Patient requests 90 days supply**   . citalopram (CELEXA) 20 MG tablet      TAKE 1 AND 1/2 TABLETS(30 MG) BY MOUTH DAILY   45 tablet   1   . gabapentin (NEURONTIN) 300 MG capsule   Oral   Take 300 mg by mouth 3 (three) times daily.          . magnesium oxide (MAG-OX) 400 MG tablet   Oral   Take 1,200 mg by mouth daily. Reported on 05/14/2015         . metoCLOPramide (REGLAN) 10 MG tablet   Oral   Take 1 tablet (10 mg total) by mouth every 6 (six) hours as needed for nausea.   20 tablet   0   . naproxen sodium (ANAPROX) 220 MG tablet   Oral   Take 660 mg by mouth 2 (two) times daily as needed (for pain/headaches). Reported on 06/10/2015         . omeprazole (PRILOSEC) 40 MG capsule   Oral   Take 40 mg by mouth daily.         Marland Kitchen PROAIR HFA 108 (90 BASE) MCG/ACT inhaler      INHALE 2 PUFFS PO Q 4 H PRF WHEEZING   18 g   3     Dispense as written.   . traMADol (ULTRAM) 50 MG tablet      TAKE 1 TABLET BY MOUTH EVERY 8 HOURS AS NEEDED.   84 tablet   0   . traZODone (DESYREL) 100 MG tablet   Oral   Take 1 tablet (100 mg total) by mouth at bedtime as needed for sleep.   90 tablet   1   . verapamil (VERELAN PM) 240 MG 24 hr capsule   Oral   Take 240 mg by mouth at bedtime.           Allergies Rocephin; Doxycycline; Ace inhibitors; Compazine; Morphine and related; Other; Prochlorperazine edisylate; Sulfa antibiotics; Zocor; Biaxin; Erythromycin; and Latex  Family History  Problem Relation Age of Onset  . Other Mother     IBS  .  Heart disease Father   . Diverticulosis Father   . Other Son     Ulcerative Colitis,  Polonidal cyst  . Parkinson's disease Maternal Grandmother   . Cancer Maternal Grandfather     Bladder, Prostate  . Mental illness Paternal Grandmother   . Stroke Paternal Grandmother   . Migraines Father   . Cancer Maternal Grandmother   . Stroke Maternal Grandfather   . Macular degeneration    . Cancer Maternal Grandfather     bladder  . Cancer Maternal Grandfather     prostate  . Cancer Paternal Grandmother     brain cancer  . Bipolar disorder Paternal Grandmother   . Heart disease Paternal Grandfather   . Heart attack Paternal Grandfather   . Hypertension Mother   . Diabetes Brother     Social History Social History  Substance Use Topics  . Smoking status: Former Smoker -- 0.25 packs/day for 1 years    Types: Cigarettes    Quit date: 06/16/2015  . Smokeless tobacco: Never Used     Comment: 1-2 cigarettes approx 3 x/wk  . Alcohol Use: 0.0 oz/week    0 Standard drinks or equivalent per week     Comment: social    Review of Systems Constitutional: No fever/chills Eyes: No visual changes. ENT: No sore throat. Cardiovascular: Denies chest pain. Respiratory: Denies shortness of breath. Gastrointestinal: No abdominal pain.  No nausea, no vomiting.  No diarrhea.  No constipation. Genitourinary: Negative for dysuria. Musculoskeletal: Negative for back pain. Skin: Negative for rash. Neurological: Negative for numbness.  10-point ROS otherwise negative.  ____________________________________________   PHYSICAL EXAM:  VITAL SIGNS: ED Triage Vitals  Enc Vitals Group     BP 09/18/15 1425 144/68 mmHg     Pulse Rate 09/18/15 1422 77     Resp 09/18/15 1422 18     Temp 09/18/15 1422 97.9 F (36.6 C)     Temp Source 09/18/15 1422 Oral     SpO2 09/18/15 1422 96 %     Weight 09/18/15 1422 243 lb (110.224 kg)     Height 09/18/15 1422 5\' 5"  (1.651 m)     Head Cir --      Peak Flow --      Pain Score 09/18/15 1424 8     Pain Loc --      Pain Edu? --      Excl. in  [place]? --    Constitutional: Alert and oriented. Well appearing and in no acute distress. Eyes: Conjunctivae are normal. PERRL. EOMI. Head: Atraumatic. Nose: No congestion/rhinnorhea. Mouth/Throat: Mucous membranes are moist.  Oropharynx non-erythematous. Neck: No stridor.   Cardiovascular: Normal rate, regular rhythm. Grossly normal heart sounds.  Good peripheral circulation. Respiratory: Normal respiratory effort.  No retractions. Lungs CTAB. Gastrointestinal: Soft and nontender. No distention. No abdominal bruits. No CVA tenderness. Musculoskeletal: No lower extremity tenderness nor edema.  No joint effusions. Neurologic:  Normal speech and language.  NIH score equals 2, performed by me at bedside. The patient has no pronator drift. The patient has normal cranial nerve exam except for a mild L sided facial droop. Extraocular movements are normal. Visual fields are normal. Patient has 5 out of 5 strength in all extremities. There is no numbness or gross, acute sensory abnormality in the extremities bilaterally except the R hand reports some mild tingling. No speech disturbance. No dysarthria. No aphasia. No ataxia. Normal finger nose finger bilat. Patient speaking in full and clear sentences.   Skin:  Skin is warm, dry and intact. No rash noted. Psychiatric: Mood and affect are normal. Speech and behavior are normal.  ____________________________________________   LABS (all labs ordered are listed, but only abnormal results are displayed)  Labs Reviewed  BASIC METABOLIC PANEL - Abnormal; Notable for the following:    Glucose, Bld 141 (*)    All other components within normal limits  CBC  TROPONIN I   ____________________________________________  EKG  ED ECG REPORT I, Avedis Bevis, the attending physician, personally viewed and interpreted this ECG.  Date: 09/18/2015 EKG Time: 1420 Rate: 35 Rhythm: normal sinus rhythm QRS Axis: Possible LVH Intervals: normal ST/T  Wave abnormalities: normal Conduction Disturbances: none Narrative Interpretation: unremarkable, there arechanges for LVH are noted  ____________________________________________  RADIOLOGY   ____________________________________________   PROCEDURES  Procedure(s) performed: None  Critical Care performed: No  ____________________________________________   INITIAL IMPRESSION / ASSESSMENT AND PLAN / ED COURSE  Pertinent labs & imaging results that were available during my care of the patient were reviewed by me and considered in my medical decision making (see chart for details).  Patient presents for evaluation of fatigue, headache, and she has notable neurologic deficits including left-sided facial droop and right hand paresthesias. Interestingly though these are bilateral, and unlikely represents an acute stroke and the patient reports via her history that this is typical in that she has a long history of hemiplegic migraines with the same symptoms. She also reports that she's been worked up over the years for possible autoimmune disorder, height possibly lupus. However she reports these symptoms seem to come about whenever she has a migraine.  Based on the patient's reported long history of similar symptoms, which she reports to be this a.m. I find it unlikely to think this is an acute stroke especially with bilateral symptomatology. This is associated with a throbbing headache which she describes her migraine. Rather than obtaining CT imaging at this time, we will treat her with headache cocktail and reevaluate for symptoms. Should she have persistent deficits, then I would consider potentially obtaining imaging, but at this point feel most likely represents her typical migraine symptoms.  ----------------------------------------- 7:22 PM on 09/18/2015 -----------------------------------------  The patient case clinical history and extensive neurology notes are reviewed. Discussed  this with Dr. Regenia Skeeter including the patient's left-sided headache, facial droop, etc.  the patient reports that she would like to be a little home, and in further discussing with her she assures me she's had the same type of headache and same symptoms and usually her symptoms go away after about a day. Appears to be that this correlates clinically well with her chronic migraines and hemiplegia. I'll discharge her to home and she'll follow up closely with her neurologist. Headache return precautions discussed the patient is quite agreeable. She reports her headache is getting better, just very mild she preferred to home at this time. I think this is reasonable. Discussed with Dr. Doy Mince and she advises no need for further evaluation or neuro imaging at this time given the patient's reported history of similar. ____________________________________________   FINAL CLINICAL IMPRESSION(S) / ED DIAGNOSES  Final diagnoses:  Hemiplegic migraine without status migrainosus, not intractable      Delman Kitten, MD 09/18/15 1933

## 2015-09-18 NOTE — Discharge Instructions (Signed)
Recurrent Migraine Headache A migraine headache is an intense, throbbing pain on one or both sides of your head. Recurrent migraines keep coming back. A migraine can last for 30 minutes to several hours. CAUSES  The exact cause of a migraine headache is not always known. However, a migraine may be caused when nerves in the brain become irritated and release chemicals that cause inflammation. This causes pain. Certain things may also trigger migraines, such as:   Alcohol.  Smoking.  Stress.  Menstruation.  Aged cheeses.  Foods or drinks that contain nitrates, glutamate, aspartame, or tyramine.  Lack of sleep.  Chocolate.  Caffeine.  Hunger.  Physical exertion.  Fatigue.  Medicines used to treat chest pain (nitroglycerine), birth control pills, estrogen, and some blood pressure medicines. SYMPTOMS   Pain on one or both sides of your head.  Pulsating or throbbing pain.  Severe pain that prevents daily activities.  Pain that is aggravated by any physical activity.  Nausea, vomiting, or both.  Dizziness.  Pain with exposure to bright lights, loud noises, or activity.  General sensitivity to bright lights, loud noises, or smells. Before you get a migraine, you may get warning signs that a migraine is coming (aura). An aura may include:  Seeing flashing lights.  Seeing bright spots, halos, or zigzag lines.  Having tunnel vision or blurred vision.  Having feelings of numbness or tingling.  Having trouble talking.  Having muscle weakness. DIAGNOSIS  A recurrent migraine headache is often diagnosed based on:  Symptoms.  Physical examination.  A CT scan or MRI of your head. These imaging tests cannot diagnose migraines but can help rule out other causes of headaches.  TREATMENT  Medicines may be given for pain and nausea. Medicines can also be given to help prevent recurrent migraines. HOME CARE INSTRUCTIONS  Only take over-the-counter or prescription  medicines for pain or discomfort as directed by your health care provider. The use of long-term narcotics is not recommended.  Lie down in a dark, quiet room when you have a migraine.  Keep a journal to find out what may trigger your migraine headaches. For example, write down:  What you eat and drink.  How much sleep you get.  Any change to your diet or medicines.  Limit alcohol consumption.  Quit smoking if you smoke.  Get 7-9 hours of sleep, or as recommended by your health care provider.  Limit stress.  Keep lights dim if bright lights bother you and make your migraines worse. SEEK MEDICAL CARE IF:   You do not get relief from the medicines given to you.  You have a recurrence of pain.  You have a fever. SEEK IMMEDIATE MEDICAL CARE IF:  Your migraine becomes severe.  You have a stiff neck.  You have loss of vision.  You have muscular weakness or loss of muscle control.  You start losing your balance or have trouble walking.  You feel faint or pass out.  You have severe symptoms that are different from your first symptoms. MAKE SURE YOU:   Understand these instructions.  Will watch your condition.  Will get help right away if you are not doing well or get worse.   This information is not intended to replace advice given to you by your health care provider. Make sure you discuss any questions you have with your health care provider.   Document Released: 02/08/2001 Document Revised: 06/06/2014 Document Reviewed: 01/21/2013 Elsevier Interactive Patient Education 2016 [employer]  

## 2015-09-21 ENCOUNTER — Ambulatory Visit: Payer: BC Managed Care – PPO | Admitting: Family Medicine

## 2015-09-24 ENCOUNTER — Ambulatory Visit (INDEPENDENT_AMBULATORY_CARE_PROVIDER_SITE_OTHER): Payer: BC Managed Care – PPO | Admitting: Family Medicine

## 2015-09-24 ENCOUNTER — Encounter: Payer: Self-pay | Admitting: Family Medicine

## 2015-09-24 VITALS — BP 148/93 | HR 86 | Temp 98.8°F | Ht 65.4 in | Wt 246.0 lb

## 2015-09-24 DIAGNOSIS — I10 Essential (primary) hypertension: Secondary | ICD-10-CM

## 2015-09-24 DIAGNOSIS — R404 Transient alteration of awareness: Secondary | ICD-10-CM | POA: Diagnosis not present

## 2015-09-24 DIAGNOSIS — R0683 Snoring: Secondary | ICD-10-CM

## 2015-09-24 MED ORDER — EPINEPHRINE 0.3 MG/0.3ML IJ SOAJ
0.3000 mg | Freq: Once | INTRAMUSCULAR | Status: DC
Start: 2015-09-24 — End: 2016-12-04

## 2015-09-24 MED ORDER — PROAIR HFA 108 (90 BASE) MCG/ACT IN AERS: INHALATION_SPRAY | RESPIRATORY_TRACT | Status: DC

## 2015-09-24 MED ORDER — HYDROCHLOROTHIAZIDE 25 MG PO TABS: 25.0000 mg | ORAL_TABLET | Freq: Every day | ORAL | Status: DC

## 2015-09-24 NOTE — Assessment & Plan Note (Signed)
Will start HCTZ. Recheck BP in 1 month. Call with any concerns.

## 2015-09-24 NOTE — Progress Notes (Signed)
BP 148/93 mmHg  Pulse 86  Temp(Src) 98.8 F (37.1 C)  Ht 5' 5.4" (1.661 m)  Wt 246 lb (111.585 kg)  BMI 40.45 kg/m2  SpO2 98%  LMP 07/01/2013   Subjective:    Patient ID: Shirley Rogers, female    DOB: 1965/06/05, 50 y.o.   MRN: [id number]  HPI: Shirley Rogers is a 50 y.o. female  Chief Complaint  Patient presents with  . Hospitalization Follow-up    Patient states that she went to the ER for loss of consciousness, she was not given a diagnosis. Her behavior was very off and she can not remember anything from that day, which was last Friday. Patient states that she is very fatigued, no appetitate and  lethargic  . Medication Refill    Patient needs a refill on her Albuterol, also she needs an extra epi-pen that can stay at work with her, so that she will have it if she has the major headache again where her throat closes up.   ER FOLLOW UP- has been to the hospital 2x in the last 10 days. 1st time for SOB and leg swelling and host of weird symptoms- was worked up for CHF and not thought to have it. Likely rheumatologic issue. Time since discharge: 9 days Hospital/facility: [facility] Diagnosis: ?rheumatologic issus Procedures/tests: labs, ekg, CXR Consultants: none New medications: tordol  Discharge instructions:  Follow up here and with rheum Status: better  Legs began swelling about 10 days ago. Painful. Bilateral, R>L  To have sleep study done on 10/07/15. Scheduled for a split night.  Saw neurology for EMG 4/20 for L hand weakness- normal  ER FOLLOW UP-Had career day at school, has loss of memory from about mid day with behavioral issues, not like her self, speech started to slur, and almost fell several times, was swearing quite a bit and acting almost drunk and beligerant- doesn't remember any of this, went to the nurses office, had facial droop and had to have her epi-pen due to paresis in her throat, sweating profusely, not responsive, hadn't taken any medication, nothing else  going on Back to the ER on 09/18/15 for dizziness and chest pain Time since discharge: 6 days  Hospital/facility: [facility] Diagnosis: ?? Migraine Procedures/tests: glucose, EKG Consultants: Neurology New medications: Migraine cocktail Discharge instructions:  Follow up here and with neurology Status: better   Having MRI done of her head and neck on 09/30/15  Saw cardiology for preop clearance in March with echo and myoview which were normal.   Seeing Rheumatology again in May, Seeing neurology again in June  Relevant past medical, surgical, family and social history reviewed and updated as indicated. Interim medical history since our last visit reviewed. Allergies and medications reviewed and updated.  Review of Systems  Constitutional: Negative.   Respiratory: Negative.   Cardiovascular: Positive for leg swelling. Negative for chest pain and palpitations.  Musculoskeletal: Negative.   Neurological: Positive for dizziness, facial asymmetry, speech difficulty and headaches. Negative for tremors, seizures, syncope, weakness, light-headedness and numbness.  Psychiatric/Behavioral: Positive for behavioral problems, confusion and agitation. Negative for suicidal ideas, hallucinations, sleep disturbance, self-injury, dysphoric mood and decreased concentration. The patient is nervous/anxious. The patient is not hyperactive.     Per HPI unless specifically indicated above     Objective:    BP 148/93 mmHg  Pulse 86  Temp(Src) 98.8 F (37.1 C)  Ht 5' 5.4" (1.661 m)  Wt 246 lb (111.585 kg)  BMI 40.45 kg/m2  SpO2 98%  LMP 07/01/2013  Wt Readings from Last 3 Encounters:  09/24/15 246 lb (111.585 kg)  09/18/15 243 lb (110.224 kg)  09/15/15 243 lb (110.224 kg)    Physical Exam  Constitutional: She is oriented to person, place, and time. She appears well-developed and well-nourished. No distress.  HENT:  Head: Normocephalic and atraumatic.  Right Ear: Hearing and external ear normal.   Left Ear: Hearing and external ear normal.  Nose: Nose normal.  Mouth/Throat: Oropharynx is clear and moist. No oropharyngeal exudate.  Eyes: Conjunctivae and lids are normal. Right eye exhibits no discharge. Left eye exhibits no discharge. No scleral icterus.  Cardiovascular: Normal rate, regular rhythm, normal heart sounds and intact distal pulses.  Exam reveals no gallop and no friction rub.   No murmur heard. Pulmonary/Chest: Effort normal and breath sounds normal. No respiratory distress. She has no wheezes. She has no rales. She exhibits no tenderness.  Musculoskeletal: Normal range of motion.  Neurological: She is alert and oriented to person, place, and time.  Skin: Skin is warm, dry and intact. No rash noted. She is not diaphoretic. No erythema. No pallor.  Psychiatric: She has a normal mood and affect. Her speech is normal and behavior is normal. Judgment and thought content normal. Cognition and memory are normal.  Nursing note and vitals reviewed.   Results for orders placed or performed during the hospital encounter of [id number]  Basic metabolic panel  Result Value Ref Range   Sodium 139 135 - 145 mmol/L   Potassium 4.0 3.5 - 5.1 mmol/L   Chloride 101 101 - 111 mmol/L   CO2 30 22 - 32 mmol/L   Glucose, Bld 141 (H) 65 - 99 mg/dL   BUN 16 6 - 20 mg/dL   Creatinine, Ser 0.82 0.44 - 1.00 mg/dL   Calcium 9.3 8.9 - 10.3 mg/dL   GFR calc non Af Amer >60 >60 mL/min   GFR calc Af Amer >60 >60 mL/min   Anion gap 8 5 - 15  CBC  Result Value Ref Range   WBC 5.5 3.6 - 11.0 K/uL   RBC 4.72 3.80 - 5.20 MIL/uL   Hemoglobin 14.0 12.0 - 16.0 g/dL   HCT 41.9 35.0 - 47.0 %   MCV 88.9 80.0 - 100.0 fL   MCH 29.7 26.0 - 34.0 pg   MCHC 33.4 32.0 - 36.0 g/dL   RDW 13.3 11.5 - 14.5 %   Platelets 208 150 - 440 K/uL  Troponin I  Result Value Ref Range   Troponin I <0.03 <0.031 ng/mL      Assessment & Plan:   Problem List Items Addressed This Visit      Cardiovascular and Mediastinum    Hypertension - Primary    Will start HCTZ. Recheck BP in 1 month. Call with any concerns.       Relevant Medications   EPINEPHrine (EPIPEN 2-PAK) 0.3 mg/0.3 mL IJ SOAJ injection   hydrochlorothiazide (HYDRODIURIL) 25 MG tablet     Other   Snoring    Sleep study to be done 10/07/15.      Transient alteration of awareness    Of unclear etiology. Labs recently checked and were normal. To have MRI next week. Concern for possible low-grade seizure activity with post-ictal state. Will follow up with neurology. Appointment made today. Continue to monitor closely.          Follow up plan: Return in about 4 weeks (around 10/22/2015).

## 2015-09-24 NOTE — Assessment & Plan Note (Signed)
Of unclear etiology. Labs recently checked and were normal. To have MRI next week. Concern for possible low-grade seizure activity with post-ictal state. Will follow up with neurology. Appointment made today. Continue to monitor closely.

## 2015-09-24 NOTE — Assessment & Plan Note (Signed)
Sleep study to be done 10/07/15.

## 2015-09-30 ENCOUNTER — Ambulatory Visit (INDEPENDENT_AMBULATORY_CARE_PROVIDER_SITE_OTHER): Payer: BC Managed Care – PPO

## 2015-09-30 DIAGNOSIS — R208 Other disturbances of skin sensation: Secondary | ICD-10-CM | POA: Diagnosis not present

## 2015-09-30 DIAGNOSIS — M352 Behcet's disease: Secondary | ICD-10-CM

## 2015-09-30 DIAGNOSIS — R200 Anesthesia of skin: Secondary | ICD-10-CM

## 2015-09-30 DIAGNOSIS — M6289 Other specified disorders of muscle: Secondary | ICD-10-CM

## 2015-09-30 DIAGNOSIS — R29898 Other symptoms and signs involving the musculoskeletal system: Secondary | ICD-10-CM

## 2015-10-01 MED ORDER — GADOPENTETATE DIMEGLUMINE 469.01 MG/ML IV SOLN: 20.0000 mL | Freq: Once | INTRAVENOUS | Status: AC | PRN

## 2015-10-06 ENCOUNTER — Other Ambulatory Visit: Payer: Self-pay | Admitting: Family Medicine

## 2015-10-07 ENCOUNTER — Ambulatory Visit (INDEPENDENT_AMBULATORY_CARE_PROVIDER_SITE_OTHER): Payer: BC Managed Care – PPO | Admitting: Neurology

## 2015-10-07 ENCOUNTER — Other Ambulatory Visit: Payer: Self-pay | Admitting: Family Medicine

## 2015-10-07 DIAGNOSIS — G4710 Hypersomnia, unspecified: Secondary | ICD-10-CM

## 2015-10-07 DIAGNOSIS — R51 Headache: Principal | ICD-10-CM

## 2015-10-07 DIAGNOSIS — R519 Headache, unspecified: Secondary | ICD-10-CM

## 2015-10-07 MED ORDER — TRAMADOL HCL 50 MG PO TABS: ORAL_TABLET | ORAL | Status: DC

## 2015-10-13 ENCOUNTER — Encounter: Payer: Self-pay | Admitting: Diagnostic Neuroimaging

## 2015-10-13 ENCOUNTER — Ambulatory Visit (INDEPENDENT_AMBULATORY_CARE_PROVIDER_SITE_OTHER): Payer: BC Managed Care – PPO | Admitting: Diagnostic Neuroimaging

## 2015-10-13 VITALS — BP 140/96 | HR 71 | Ht 64.0 in | Wt 250.0 lb

## 2015-10-13 DIAGNOSIS — R404 Transient alteration of awareness: Secondary | ICD-10-CM

## 2015-10-13 DIAGNOSIS — M352 Behcet's disease: Secondary | ICD-10-CM | POA: Diagnosis not present

## 2015-10-13 DIAGNOSIS — G43409 Hemiplegic migraine, not intractable, without status migrainosus: Secondary | ICD-10-CM | POA: Diagnosis not present

## 2015-10-13 NOTE — Patient Instructions (Signed)
Thank you for coming to see [place] at [facility]. I hope we have been able to provide you high quality care today.  You may receive a patient satisfaction survey over the next few weeks. We would appreciate your feedback and comments so that we may continue to improve ourselves and the health of our patients.  - I will check EEG   ~~~~~~~~~~~~~~~~~~~~~~~~~~~~~~~~~~~~~~~~~~~~~~~~~~~~~~~~~~~~~~~~~  DR. PENUMALLI'S GUIDE TO HAPPY AND HEALTHY LIVING These are some of my general health and wellness recommendations. Some of them may apply to you better than others. Please use common sense as you try these suggestions and feel free to ask me any questions.   ACTIVITY/FITNESS Mental, social, emotional and physical stimulation are very important for brain and body health. Try learning a new activity (arts, music, language, sports, games).  Keep moving your body to the best of your abilities. You can do this at home, inside or outside, the park, community center, gym or anywhere you like. Consider a physical therapist or personal trainer to get started. Consider the app Sworkit. Fitness trackers such as smart-watches, smart-phones or Fitbits can help as well.   NUTRITION Eat more plants: colorful vegetables, nuts, seeds and berries.  Eat less sugar, salt, preservatives and processed foods.  Avoid toxins such as cigarettes and alcohol.  Drink water when you are thirsty. Warm water with a slice of lemon is an excellent morning drink to start the day.  Consider these websites for more information The Nutrition Source (https://www.henry-hernandez.biz/) Precision Nutrition (WindowBlog.ch)   RELAXATION Consider practicing mindfulness meditation or other relaxation techniques such as deep breathing, prayer, yoga, tai chi, massage. See website mindful.org or the apps Headspace or Calm to help get started.   SLEEP Try to get at least 7-8+  hours sleep per day. Regular exercise and reduced caffeine will help you sleep better. Practice good sleep hygeine techniques. See website sleep.org for more information.   PLANNING Prepare estate planning, living will, healthcare POA documents. Sometimes this is best planned with the help of an attorney. Theconversationproject.org and agingwithdignity.org are excellent resources.
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
## 2015-10-15 ENCOUNTER — Telehealth: Payer: Self-pay

## 2015-10-15 DIAGNOSIS — G4733 Obstructive sleep apnea (adult) (pediatric): Secondary | ICD-10-CM

## 2015-10-15 NOTE — Telephone Encounter (Signed)
Spoke to pt regarding her sleep study results. 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 PAP therapy is indicated. Dr. Roxy Horseman recommends proceeding with a CPAP titration study to optimize therapy. I advised pt that alternative therapies such as oral appliance or ENT evaluation treat snoring and mild apnea but do not improve REM dependent hypoventilation and hypoxemia. I advised her that weight loss and positional therapy may be entertained. I advised pt to avoid sedative-hypnotics which may worsen sleep apnea, alcohol and tobacco. I advised pt to lose weight, diet, and exercise if not contraindicated by her other physicians. Pt was advised to not drive or operate hazardous machinery when sleepy.  Pt verbalized understanding. I advised pt that our sleep lab will call her to set up her cpap titration study. Pt verbalized understanding of results. Pt had no questions at this time but was encouraged to call back if questions arise.

## 2015-10-19 DIAGNOSIS — R768 Other specified abnormal immunological findings in serum: Secondary | ICD-10-CM | POA: Insufficient documentation

## 2015-10-22 ENCOUNTER — Telehealth: Payer: Self-pay | Admitting: Diagnostic Neuroimaging

## 2015-10-22 NOTE — Telephone Encounter (Signed)
I spoke with NP at [facility] rheumatology. Recommended cerebral angiogram and LP for further evaluation. They will setup with [facility] and [facility]. -VRP

## 2015-10-22 NOTE — Telephone Encounter (Signed)
Called Raechel Ache NP,  [facility],   [phone], who stated she needs to talk to Dr Leta Baptist about an MRA and LP on patient. Inquired if this is emergent; she stated "yes and no." She is requesting a call back within 30-60 min. At the latest.  Informed her would inform Dr Leta Baptist. She verbalized understanding, appreciation.

## 2015-10-22 NOTE — Telephone Encounter (Signed)
Raechel Ache NP [facility] at [phone] request a call asap regarding Makinly Mangel mrn # [id number].  She has more recommendations regarding workup for vasculitis.

## 2015-10-27 ENCOUNTER — Other Ambulatory Visit: Payer: Self-pay | Admitting: Family Medicine

## 2015-10-27 MED ORDER — TRAMADOL HCL 50 MG PO TABS: ORAL_TABLET | ORAL | Status: DC

## 2015-10-28 ENCOUNTER — Telehealth: Payer: Self-pay | Admitting: Diagnostic Neuroimaging

## 2015-10-28 ENCOUNTER — Ambulatory Visit
Admission: RE | Admit: 2015-10-28 | Discharge: 2015-10-28 | Disposition: A | Discharge status: Home / Self Care | Payer: BC Managed Care – PPO | Source: Ambulatory Visit | Attending: Family Medicine | Admitting: Family Medicine

## 2015-10-28 DIAGNOSIS — M352 Behcet's disease: Secondary | ICD-10-CM

## 2015-10-28 DIAGNOSIS — Z1239 Encounter for other screening for malignant neoplasm of breast: Secondary | ICD-10-CM

## 2015-10-28 NOTE — Telephone Encounter (Signed)
Message For: ON CALL              Taken 31-MAY-17 at  8:03AM by LSJ ------------------------------------------------------------  Caller  [facility]      CID  [id number]   Patient  [facility]           Pt's Dr  Thalia Party       Area Code  252  Phone#  [address] [place]  [phone]     RE  L.P. &  M.R.A  SCHEDULING/SHE NEEDS TO SPEAK      WITH SOMEONE WITH IN 15 MIN                           Disp:Y/N  Y  If Y = C/B If No Response In 18minutes  ============================================================

## 2015-10-28 NOTE — Telephone Encounter (Signed)
Spoke with [facility] who stated Dr Stann Mainland would like Dr Leta Baptist to order the following: MRA head/neck LP with gram stain, culture, fluid, cell count, protein, glucose, oligoclonal bands, Igg index, serum albumin. She stated Igg index and blood draw to be done at same time as LP.  She requested call back when orders have been placed.

## 2015-10-30 NOTE — Telephone Encounter (Signed)
Orders placed. -VRP 

## 2015-11-02 NOTE — Telephone Encounter (Signed)
LVM informing Shirley Rogers orders were placed as requested, on 10/30/15. Left name,number for questions.

## 2015-11-03 ENCOUNTER — Other Ambulatory Visit: Payer: Self-pay | Admitting: Family Medicine

## 2015-11-03 ENCOUNTER — Encounter: Payer: Self-pay | Admitting: Family Medicine

## 2015-11-03 ENCOUNTER — Ambulatory Visit (INDEPENDENT_AMBULATORY_CARE_PROVIDER_SITE_OTHER): Payer: BC Managed Care – PPO | Admitting: Family Medicine

## 2015-11-03 VITALS — BP 138/84 | HR 81 | Temp 98.7°F | Wt 250.0 lb

## 2015-11-03 DIAGNOSIS — Z87440 Personal history of urinary (tract) infections: Secondary | ICD-10-CM | POA: Diagnosis not present

## 2015-11-03 DIAGNOSIS — N644 Mastodynia: Secondary | ICD-10-CM | POA: Diagnosis not present

## 2015-11-03 DIAGNOSIS — I10 Essential (primary) hypertension: Secondary | ICD-10-CM

## 2015-11-03 MED ORDER — HYDROCHLOROTHIAZIDE 25 MG PO TABS: 25.0000 mg | ORAL_TABLET | Freq: Every day | ORAL | Status: DC

## 2015-11-03 NOTE — Progress Notes (Signed)
BP 138/84 mmHg  Pulse 81  Temp(Src) 98.7 F (37.1 C)  Wt 250 lb (113.399 kg)  SpO2 98%  LMP 07/01/2013   Subjective:    Patient ID: Shirley Rogers, female    DOB: [birth year], 50 y.o.   MRN: [id number]  HPI: Shirley Rogers is a 50 y.o. female  Chief Complaint  Patient presents with  . Hypertension  . Question    Patient has a few questions about her inflammation, she went to the ER on Sunday, she was given prednisone dose pack, which I accidentally discontinued.   HYPERTENSION Hypertension status: better  Satisfied with current treatment? yes Duration of hypertension: chronic BP monitoring frequency:  not checking BP medication side effects:  no Medication compliance: excellent compliance Aspirin: yes Recurrent headaches: yes Visual changes: yes Palpitations: no Dyspnea: no Chest pain: no Lower extremity edema: no Dizzy/lightheaded: no  Went to the urgent care for swelling in her foot. Has been taking the prednisone and it has helped. Her rheumatologist wants to have her have a LP- to be ordered with her neurologist. Still working with them. They would like her to see a urologist. She has had a bad experience with one before with being diagnosed with IC and noting that they wanted to do a lot of treatments on her. She is anxious about going back to see someone and would like to see a female provider if possible.   She also notes that she has been having some burning and feels like it might have a mass in it, she was supposed to have a screening mammogram, but had to get it changed to a diagnostic.   Relevant past medical, surgical, family and social history reviewed and updated as indicated. Interim medical history since our last visit reviewed. Allergies and medications reviewed and updated.  Review of Systems  Constitutional: Negative.   Respiratory: Negative.   Cardiovascular: Negative.   Musculoskeletal: Negative.   Psychiatric/Behavioral: Negative.     Per HPI  unless specifically indicated above     Objective:    BP 138/84 mmHg  Pulse 81  Temp(Src) 98.7 F (37.1 C)  Wt 250 lb (113.399 kg)  SpO2 98%  LMP 07/01/2013  Wt Readings from Last 3 Encounters:  11/03/15 250 lb (113.399 kg)  10/13/15 250 lb (113.399 kg)  09/28/15 250 lb (113.399 kg)    Physical Exam  Constitutional: She is oriented to person, place, and time. She appears well-developed and well-nourished. No distress.  HENT:  Head: Normocephalic and atraumatic.  Right Ear: Hearing normal.  Left Ear: Hearing normal.  Nose: Nose normal.  Eyes: Conjunctivae and lids are normal. Right eye exhibits no discharge. Left eye exhibits no discharge. No scleral icterus.  Cardiovascular: Normal rate, regular rhythm, normal heart sounds and intact distal pulses.  Exam reveals no gallop and no friction rub.   No murmur heard. Pulmonary/Chest: Effort normal and breath sounds normal. No respiratory distress. She has no wheezes. She has no rales. She exhibits no tenderness.  Musculoskeletal: Normal range of motion.  Neurological: She is alert and oriented to person, place, and time.  Skin: Skin is warm, dry and intact. No rash noted. No erythema. No pallor.  Psychiatric: She has a normal mood and affect. Her speech is normal and behavior is normal. Judgment and thought content normal. Cognition and memory are normal.  Nursing note and vitals reviewed.      Assessment & Plan:   Problem List Items Addressed This Visit  Cardiovascular and Mediastinum   Hypertension - Primary    Under good control. Continue current regimen. Continue to monitor.       Relevant Medications   hydrochlorothiazide (HYDRODIURIL) 25 MG tablet    Other Visit Diagnoses    History of cystitis        Refereral to urology made. She would like to see a female provider.     Relevant Orders    Ambulatory referral to Urology    Breast pain        Diagnostic mammogram ordered. Await results.     Relevant Orders     MM Digital Diagnostic Bilat    US BREAST COMPLETE UNI RIGHT INC AXILLA        Follow up plan: Return 2-3 months Pap, 6 months follow up.

## 2015-11-03 NOTE — Assessment & Plan Note (Signed)
Under good control. Continue current regimen. Continue to monitor.  

## 2015-11-04 ENCOUNTER — Telehealth: Payer: Self-pay | Admitting: Family Medicine

## 2015-11-04 ENCOUNTER — Encounter: Payer: Self-pay | Admitting: Family Medicine

## 2015-11-04 DIAGNOSIS — N644 Mastodynia: Secondary | ICD-10-CM

## 2015-11-04 LAB — BASIC METABOLIC PANEL
BUN / CREAT RATIO: 17 (ref 9–23)
BUN: 14 mg/dL (ref 6–24)
CALCIUM: 9.3 mg/dL (ref 8.7–10.2)
CO2: 25 mmol/L (ref 18–29)
Chloride: 95 mmol/L — ABNORMAL LOW (ref 96–106)
Creatinine, Ser: 0.82 mg/dL (ref 0.57–1.00)
GFR, EST AFRICAN AMERICAN: 96 mL/min/{1.73_m2} (ref >59)
GFR, EST NON AFRICAN AMERICAN: 83 mL/min/{1.73_m2} (ref >59)
Glucose: 160 mg/dL — ABNORMAL HIGH (ref 65–99)
Potassium: 4 mmol/L (ref 3.5–5.2)
Sodium: 139 mmol/L (ref 134–144)

## 2015-11-04 NOTE — Telephone Encounter (Signed)
Needs order for L [place]- ordered.

## 2015-11-10 ENCOUNTER — Ambulatory Visit (INDEPENDENT_AMBULATORY_CARE_PROVIDER_SITE_OTHER): Payer: BC Managed Care – PPO | Admitting: Diagnostic Neuroimaging

## 2015-11-10 DIAGNOSIS — M352 Behcet's disease: Secondary | ICD-10-CM

## 2015-11-10 DIAGNOSIS — R4182 Altered mental status, unspecified: Secondary | ICD-10-CM | POA: Diagnosis not present

## 2015-11-10 DIAGNOSIS — R404 Transient alteration of awareness: Secondary | ICD-10-CM

## 2015-11-10 DIAGNOSIS — G43409 Hemiplegic migraine, not intractable, without status migrainosus: Secondary | ICD-10-CM

## 2015-11-11 NOTE — Procedures (Signed)
   [facility]  EEG (ELECTROENCEPHALOGRAM) REPORT   STUDY DATE: 11/10/15 PATIENT NAME: Shirley Rogers DOB: [birth year] MRN: [id number]  ORDERING CLINICIAN: Andrey Spearman, MD   TECHNOLOGIST: Laretta Alstrom  TECHNIQUE: Electroencephalogram was recorded utilizing standard 10-20 system of lead placement and reformatted into average and bipolar montages.  RECORDING TIME: 27 minutes ACTIVATION: hyperventilation  CLINICAL INFORMATION: 50 year old female with abnormal spells and memory lapses.  FINDINGS: Background rhythms of 9-10 hertz and 20-30 microvolts. No focal, lateralizing, epileptiform activity or seizures are seen. Patient recorded in the awake and drowsy state. EKG channel shows regular rhythms of 70-75 beats per minute.   IMPRESSION:  Normal EEG in the awake and drowsy state.    INTERPRETING PHYSICIAN:  Penni Bombard, MD Certified in Neurology, Neurophysiology and Neuroimaging  [facility] [address], [place], [place] [address] [phone]

## 2015-11-13 ENCOUNTER — Ambulatory Visit
Admission: RE | Admit: 2015-11-13 | Discharge: 2015-11-13 | Disposition: A | Discharge status: Home / Self Care | Payer: BC Managed Care – PPO | Source: Ambulatory Visit | Attending: Family Medicine | Admitting: Family Medicine

## 2015-11-13 ENCOUNTER — Telehealth: Payer: Self-pay | Admitting: Family Medicine

## 2015-11-13 ENCOUNTER — Other Ambulatory Visit: Payer: Self-pay | Admitting: Family Medicine

## 2015-11-13 DIAGNOSIS — N644 Mastodynia: Secondary | ICD-10-CM | POA: Diagnosis present

## 2015-11-13 NOTE — Telephone Encounter (Signed)
Patient notified

## 2015-11-13 NOTE — Telephone Encounter (Signed)
Please let her know that her mammogram and ultrasound were negative. If her breast pain continues, let me know and we'll see you.

## 2015-11-23 ENCOUNTER — Ambulatory Visit: Payer: BC Managed Care – PPO | Admitting: Diagnostic Neuroimaging

## 2015-11-30 ENCOUNTER — Other Ambulatory Visit: Payer: Self-pay | Admitting: Family Medicine

## 2015-11-30 NOTE — Telephone Encounter (Signed)
Can you please remind her she is on the 28 day cycle and can come pick up her Rx. Thanks!

## 2015-12-02 ENCOUNTER — Ambulatory Visit (INDEPENDENT_AMBULATORY_CARE_PROVIDER_SITE_OTHER): Payer: BC Managed Care – PPO | Admitting: Neurology

## 2015-12-02 DIAGNOSIS — G4733 Obstructive sleep apnea (adult) (pediatric): Secondary | ICD-10-CM | POA: Diagnosis not present

## 2015-12-02 NOTE — Telephone Encounter (Signed)
Spoke with patient about 28 day cycle for her controlled substances.  Pt states she understands prescription will be ready every 4th Monday after 2pm to pick up, she does not need to call.

## 2015-12-03 ENCOUNTER — Telehealth: Payer: Self-pay | Admitting: *Deleted

## 2015-12-03 ENCOUNTER — Other Ambulatory Visit: Payer: Self-pay | Admitting: Diagnostic Neuroimaging

## 2015-12-03 ENCOUNTER — Other Ambulatory Visit: Payer: Self-pay

## 2015-12-03 ENCOUNTER — Ambulatory Visit
Admission: RE | Admit: 2015-12-03 | Discharge: 2015-12-03 | Disposition: A | Discharge status: Home / Self Care | Payer: BC Managed Care – PPO | Source: Ambulatory Visit | Attending: Diagnostic Neuroimaging | Admitting: Diagnostic Neuroimaging

## 2015-12-03 DIAGNOSIS — M352 Behcet's disease: Secondary | ICD-10-CM

## 2015-12-03 DIAGNOSIS — Z720 Tobacco use: Secondary | ICD-10-CM | POA: Insufficient documentation

## 2015-12-03 LAB — CSF CELL COUNT WITH DIFFERENTIAL
BASOPHILS, %: 0 %
EOS CSF: 0 %
Lymphs, CSF: 93 % — ABNORMAL HIGH (ref 40–80)
MONOCYTE/MACROPHAGE: 7 % — AB (ref 15–45)
RBC Count, CSF: 0 cells/uL (ref 0–10)
SEGMENTED NEUTROPHILS-CSF: 0 % (ref 0–6)
WBC, CSF: 3 cells/uL (ref 0–5)

## 2015-12-03 LAB — PROTEIN, CSF: Total Protein, CSF: 25 mg/dL (ref 15–45)

## 2015-12-03 LAB — GLUCOSE, CSF: Glucose, CSF: 68 mg/dL (ref 43–76)

## 2015-12-03 MED ORDER — DIAZEPAM 5 MG PO TABS
10.0000 mg | ORAL_TABLET | Freq: Once | ORAL | Status: AC
  Administered 2015-12-03: 10 mg via ORAL

## 2015-12-03 NOTE — Progress Notes (Signed)
1 SST tube drawn from right AC to go with spinal fluid. Site is unremarkable and pt tolerated procedure well. 

## 2015-12-03 NOTE — Discharge Instructions (Signed)

## 2015-12-03 NOTE — Progress Notes (Signed)
Discharge instructions explained to patient after LP.

## 2015-12-03 NOTE — Telephone Encounter (Signed)
Received call from [facility] with report on patient's CSF that she stated was collected today.  She reported "no WBCs seen; no organisms seen". Will inform Dr Leta Baptist.

## 2015-12-05 LAB — CNS IGG SYNTHESIS RATE, CSF+BLOOD
ALBUMIN, SERUM(NEPH): 4.4 g/dL (ref 3.5–4.9)
Albumin, CSF: 9.2 mg/dL (ref 8.0–42.0)
IGG, CSF: 0.8 mg/dL (ref 0.8–7.7)
IGG, SERUM: 904 mg/dL (ref 694–1618)
IgG Index, CSF: 0.42 (ref <0.66)
MS CNS IgG Synthesis Rate: -3.9 mg/24 h (ref <=3.3)

## 2015-12-06 ENCOUNTER — Encounter: Payer: Self-pay | Admitting: Emergency Medicine

## 2015-12-06 ENCOUNTER — Emergency Department
Admission: EM | Admit: 2015-12-06 | Discharge: 2015-12-06 | Disposition: A | Discharge status: Home / Self Care | Payer: BC Managed Care – PPO | Attending: Student | Admitting: Student

## 2015-12-06 DIAGNOSIS — Z86010 Personal history of colonic polyps: Secondary | ICD-10-CM | POA: Diagnosis not present

## 2015-12-06 DIAGNOSIS — Z8673 Personal history of transient ischemic attack (TIA), and cerebral infarction without residual deficits: Secondary | ICD-10-CM | POA: Diagnosis not present

## 2015-12-06 DIAGNOSIS — Z79899 Other long term (current) drug therapy: Secondary | ICD-10-CM | POA: Diagnosis not present

## 2015-12-06 DIAGNOSIS — Z91040 Latex allergy status: Secondary | ICD-10-CM | POA: Insufficient documentation

## 2015-12-06 DIAGNOSIS — I83891 Varicose veins of right lower extremities with other complications: Secondary | ICD-10-CM

## 2015-12-06 DIAGNOSIS — F329 Major depressive disorder, single episode, unspecified: Secondary | ICD-10-CM | POA: Insufficient documentation

## 2015-12-06 DIAGNOSIS — I10 Essential (primary) hypertension: Secondary | ICD-10-CM | POA: Diagnosis not present

## 2015-12-06 DIAGNOSIS — I8391 Asymptomatic varicose veins of right lower extremity: Secondary | ICD-10-CM | POA: Insufficient documentation

## 2015-12-06 DIAGNOSIS — M5136 Other intervertebral disc degeneration, lumbar region: Secondary | ICD-10-CM | POA: Insufficient documentation

## 2015-12-06 DIAGNOSIS — E785 Hyperlipidemia, unspecified: Secondary | ICD-10-CM | POA: Insufficient documentation

## 2015-12-06 DIAGNOSIS — Z8719 Personal history of other diseases of the digestive system: Secondary | ICD-10-CM | POA: Diagnosis not present

## 2015-12-06 DIAGNOSIS — Z87891 Personal history of nicotine dependence: Secondary | ICD-10-CM | POA: Insufficient documentation

## 2015-12-06 LAB — BASIC METABOLIC PANEL
Anion gap: 8 (ref 5–15)
BUN: 15 mg/dL (ref 6–20)
CALCIUM: 9 mg/dL (ref 8.9–10.3)
CO2: 31 mmol/L (ref 22–32)
CREATININE: 1.1 mg/dL — AB (ref 0.44–1.00)
Chloride: 100 mmol/L — ABNORMAL LOW (ref 101–111)
GFR calc non Af Amer: 57 mL/min — ABNORMAL LOW (ref >60)
Glucose, Bld: 108 mg/dL — ABNORMAL HIGH (ref 65–99)
Potassium: 3.5 mmol/L (ref 3.5–5.1)
SODIUM: 139 mmol/L (ref 135–145)

## 2015-12-06 LAB — CBC WITH DIFFERENTIAL/PLATELET
BASOS PCT: 2 %
Basophils Absolute: 0.1 10*3/uL (ref 0–0.1)
EOS ABS: 0.1 10*3/uL (ref 0–0.7)
EOS PCT: 2 %
HCT: 39.5 % (ref 35.0–47.0)
Hemoglobin: 13.8 g/dL (ref 12.0–16.0)
Lymphocytes Relative: 36 %
Lymphs Abs: 2.2 10*3/uL (ref 1.0–3.6)
MCH: 30.9 pg (ref 26.0–34.0)
MCHC: 34.9 g/dL (ref 32.0–36.0)
MCV: 88.6 fL (ref 80.0–100.0)
MONO ABS: 0.4 10*3/uL (ref 0.2–0.9)
MONOS PCT: 7 %
Neutro Abs: 3.1 10*3/uL (ref 1.4–6.5)
Neutrophils Relative %: 53 %
Platelets: 238 10*3/uL (ref 150–440)
RBC: 4.46 MIL/uL (ref 3.80–5.20)
RDW: 13.1 % (ref 11.5–14.5)
WBC: 5.9 10*3/uL (ref 3.6–11.0)

## 2015-12-06 LAB — CSF CULTURE W GRAM STAIN: Gram Stain: NONE SEEN

## 2015-12-06 LAB — CSF CULTURE
GRAM STAIN: NONE SEEN
ORGANISM ID, BACTERIA: NO GROWTH

## 2015-12-06 NOTE — Discharge Instructions (Signed)
Bleeding Varicose Veins Varicose veins are veins that have become enlarged and twisted. Valves in the veins help return blood from the leg to the heart. If these valves are damaged, blood flows backward and backs up into the veins in the leg near the skin. This causes the veins to become larger because of increased pressure within them. Sometimes these veins bleed. CAUSES  Factors that can lead to bleeding varicose veins include:  Thinning of the skin that covers the veins. This skin is stretched as the veins enlarge.  Weak and thinning walls of the varicose veins. These thin walls are part of the reason why blood is not flowing normally to the heart.  Having high pressure in the veins. This high pressure occurs because the blood is not flowing freely back up to the heart.  Injury. Even a small injury to a varicose vein can cause bleeding.  Open wounds. A sore may develop near a varicose vein and not heal. This makes bleeding more likely.  Taking medicine that thins the blood. These medicines may include aspirin, anti-inflammatory medicine, and other blood thinners. SIGNS AND SYMPTOMS  If bleeding is on the outside surface of the skin, blood can be seen. Sometimes, the bleeding stays under the skin. If this happens, the blue or purple area will spread beyond the vein. This discoloration may be visible. DIAGNOSIS  To decide if you have a bleeding varicose vein, your health care provider may:  Ask about your symptoms. This will include when you first saw bleeding.  Ask about how long you have had varicose veins and if they cause you problems.  Ask about your overall health.  Ask about possible causes, such as recent cuts or if the area near the varicose veins was bumped or injured.  Examine the skin or leg that concerns you. Your health care provider will probably feel the veins.  Order imaging tests. These create detailed pictures of the veins. TREATMENT  The first goal of treating  bleeding varicose veins is to stop the bleeding. Then, the aim is to keep any bleeding from happening again. Treatment will depend on the cause of the bleeding and how bad it is. Ask your health care provider about what would be best for you. Options include:  Raising (elevating) your leg. Lie down with your leg propped up on a pillow or cushion. Your foot should be above the level of your heart.  Applying pressure to the spot that is bleeding. The bleeding should stop in a short time.  Wearing elastic stockings that "compress" your legs (compression stockings). An elastic bandage may do the same thing.  Applying an antibiotic cream on sores that are not healing.  Closing off or surgically removing the bleeding varicose veins with one of the following:  Sclerotherapy. A solution is injected into the vein to close it off.  Laser treatment. A laser is used to heat the vein to close it off.  Radiofrequency vein ablation. An electrical current produced by radio waves is used to close off the vein.  Phlebectomy. The vein is surgically removed through small incisions made over the varicose vein.  Vein ligation and stripping. The vein is surgically removed through incisions made over the varicose vein after the vein has been tied (ligated). HOME CARE INSTRUCTIONS   Apply any creams that your health care provider prescribed. Follow the directions carefully.  Wear compression stockings or any wraps as directed by your health care provider. Make sure you know:  If   you should wear them every day.  How long you should wear them.  If veins were removed or closed, a bandage (dressing) will probably cover the area. Make sure you know:  How often the dressing should be changed.  Whether the area can get wet.  When you can leave the skin uncovered.  Check your skin every day. Look for new sores and signs of bleeding.  To prevent future bleeding:  Use extra care in situations where you could  cut your legs, such as when shaving or gardening.  Try to keep your legs elevated as much as possible. Lie down when you can. SEEK MEDICAL CARE IF:   Your veins continue to bleed.  You develop new sores near your varicose veins.  You have a sore that does not heal or gets bigger.  You have increased pain in your leg.  The area around a varicose vein becomes warm, red, or tender to the touch.  You notice a yellowish fluid that smells bad coming from a spot where there was bleeding.  You have a fever. SEEK IMMEDIATE MEDICAL CARE IF:   You have chest pain or difficulty breathing.  You have severe leg pain.   This information is not intended to replace advice given to you by your health care provider. Make sure you discuss any questions you have with your health care provider.   Document Released: 10/02/2008 Document Revised: 06/06/2014 Document Reviewed: 09/17/2013 Elsevier Interactive Patient Education 2016 [employer]  

## 2015-12-06 NOTE — ED Notes (Signed)
Was shaving her legs , hit a vein , " it was squirting" DRSG intact  Controled with drsg, pt not feeling since this week after having a spinal tap for lupus.

## 2015-12-06 NOTE — ED Notes (Signed)
Right side wound from razor with gauze and kerlex wrapping; bleeding well controlled. Pulses palpable

## 2015-12-06 NOTE — ED Provider Notes (Signed)
[facility] Emergency Department Provider Note   ____________________________________________  Time seen: Approximately 6:30 PM  I have reviewed the triage vital signs and the nursing notes.   HISTORY  Chief Complaint Extremity Laceration    HPI Shirley Rogers is a 50 y.o. female with history of hypertension, history of having completed migraines, recent diagnosis of lupus who presents for evaluation of bleeding vein on the right leg sustained when she was shaving her leg just prior to arrival, gradual onset, constant, initially severe, now resolved after wrapping it and holding pressure. Patient reports that she was shaving her legs in the shower with a brand new razor when she hit a vein and "it was squirting everywhere". That is her primary complaint. She reports that she has had some general fatigue/malaise over the past few days. She had a lumbar puncture performed proximal he 3 days ago as part of her lupus workup. She denies any chest pain, difficulty breathing, nausea, vomiting, diarrhea, fevers, chills numbness or weakness.    Past Medical History  Diagnosis Date  . Barrett esophagus   . Diverticulosis   . Concussion     1974,1982,1987,2016,2016  . 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 Family history of adverse reaction to anesthesia     father - slow to wake  . Hypertension     no meds currently.  . Stroke ([place])     2000 - no deficits  . Hemiplegic migraine     daily  . Migraine   . GERD (gastroesophageal reflux disease)   . Vertigo     from concussive disorder.  seeing neuro 03/23/15  . Sleep apnea     CPAP machine broken  . Vascular spasm ([place])   . Complicated migraine     with facial drooping  . DDD (degenerative disc disease), lumbar     MRI 2008, L3-4, L5-S1, spondylotic changes, multilevel facet joint hypertrophic changes  . Recurrent sinusitis   . Recurrent  UTI     question about IC in the past  . Depression     on paxil, lexapro, cymbalta in the past  . Anxiety     on xanax in the past  . IBS (irritable bowel syndrome)   . Fibrocystic breast disease   . Hyperlipidemia   . Colon polyp   . Hemorrhoids   . ASCVD (arteriosclerotic cardiovascular disease)   . Insomnia chronic  . Fatty liver 2013    On CT abdomen/pelvis  . Kidney stone 2013    on CT abdomen/pelvis  . Umbilical hernia 0000000    on CT abdomen/pelvis  . Normal cardiac stress test 2009    EF 66%  . Narrowing of intervertebral disc space 2011    C5-6  . Carotid atherosclerosis 2003    on ultrasound  . Dizziness due to old head injury   . Diverticulosis     Patient Active Problem List   Diagnosis Date Noted  . Tobacco use 12/03/2015  . ANA positive 10/19/2015  . Transient alteration of awareness 09/24/2015  . Snoring 06/10/2015  . OSA on CPAP 06/10/2015  . 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 Globus sensation 03/30/2015  . Post-concussion syndrome 03/13/2015  . Depression 03/13/2015  . Hypertension   . Barrett esophagus   . Diverticulosis   . Hemiplegic migraine   . Lupus ([place])   .  Stroke [facility])     Past Surgical History  Procedure Laterality Date  . Tubal ligation    . Breast surgery Right     atypical hyperplasia  . Colonoscopy w/ biopsies      Removed 5 polyps  . Biopsy punch thyroid    . Cholecystectomy N/A 07/24/2015    Procedure: LAPAROSCOPIC CHOLECYSTECTOMY WITH INTRAOPERATIVE CHOLANGIOGRAM;  Surgeon: Leonie Green, MD;  Location: [facility] ORS;  Service: General;  Laterality: N/A;  . Breast excisional biopsy Right 2000    right mass excision - atypical hyperplasia    Current Outpatient Rx  Name  Route  Sig  Dispense  Refill  . busPIRone (BUSPAR) 5 MG tablet      TAKE 1 TO 3 TABLETS BY MOUTH UP TO THREE TIMES DAILY AS NEEDED   810 tablet   0     **Patient requests 90 days supply**   .  citalopram (CELEXA) 20 MG tablet      TAKE 1 AND 1/2 TABLETS(30 MG) BY MOUTH DAILY   45 tablet   6   . EPINEPHrine (EPIPEN 2-PAK) 0.3 mg/0.3 mL IJ SOAJ injection   Intramuscular   Inject 0.3 mLs (0.3 mg total) into the muscle once.   1 Device   12   . gabapentin (NEURONTIN) 300 MG capsule   Oral   Take 300 mg by mouth 3 (three) times daily.          . hydrochlorothiazide (HYDRODIURIL) 25 MG tablet   Oral   Take 1 tablet (25 mg total) by mouth daily.   90 tablet   1   . magnesium oxide (MAG-OX) 400 MG tablet   Oral   Take 1,200 mg by mouth daily. Reported on 05/14/2015         . omeprazole (PRILOSEC) 40 MG capsule   Oral   Take 40 mg by mouth daily.         Marland Kitchen PROAIR HFA 108 (90 Base) MCG/ACT inhaler      INHALE 2 PUFFS PO Q 4 H PRF WHEEZING   18 g   3     Dispense as written.   . sulfacetamide (BLEPH-10) 10 % ophthalmic solution               . traMADol (ULTRAM) 50 MG tablet      TAKE 1 TABLET BY MOUTH EVERY 8 HOURS AS NEEDED.   84 tablet   0   . traZODone (DESYREL) 100 MG tablet      TAKE 1 TABLET(100 MG) BY MOUTH AT BEDTIME AS NEEDED FOR SLEEP   90 tablet   1   . verapamil (CALAN) 40 MG tablet            6     Allergies Rocephin; Compazine; Doxycycline; Erythromycin; Statins; Sulfa antibiotics; Meloxicam; Ace inhibitors; Biaxin; Latex; and Morphine and related  Family History  Problem Relation Age of Onset  . Other Mother     IBS  . Hypertension Mother   . Heart disease Father   . Diverticulosis Father   . Migraines Father   . Other Son     Ulcerative Colitis, Polonidal cyst  . Parkinson's disease Maternal Grandmother   . Cancer Maternal Grandmother   . Mental illness Paternal Grandmother   . Stroke Paternal Grandmother   . Cancer Paternal Grandmother     brain cancer  . Bipolar disorder Paternal Grandmother   . Stroke Maternal Grandfather   . Cancer Maternal Grandfather  Bladder, Prostate/bladder/prostate  . Macular  degeneration    . Heart disease Paternal Grandfather   . Heart attack Paternal Grandfather   . Diabetes Brother   . Breast cancer Maternal Aunt     Social History Social History  Substance Use Topics  . Smoking status: Former Smoker -- 0.25 packs/day for 1 years    Types: Cigarettes    Quit date: 06/16/2015  . Smokeless tobacco: Never Used     Comment: 1-2 cigarettes approx 3 x/wk  . Alcohol Use: 0.0 oz/week    0 Standard drinks or equivalent per week     Comment: social    Review of Systems Constitutional: No fever/chills Eyes: No visual changes. ENT: No sore throat. Cardiovascular: Denies chest pain. Respiratory: Denies shortness of breath. Gastrointestinal: No abdominal pain.  No nausea, no vomiting.  No diarrhea.  No constipation. Genitourinary: Negative for dysuria. Musculoskeletal: Negative for back pain. Skin: Negative for rash. Neurological: Negative for headaches, focal weakness or numbness.  10-point ROS otherwise negative.  ____________________________________________   PHYSICAL EXAM:  VITAL SIGNS: ED Triage Vitals  Enc Vitals Group     BP 12/06/15 1816 141/93 mmHg     Pulse Rate 12/06/15 1816 82     Resp 12/06/15 1816 18     Temp 12/06/15 1816 97.6 F (36.4 C)     Temp Source 12/06/15 1816 Oral     SpO2 12/06/15 1816 100 %     Weight 12/06/15 1816 240 lb (108.863 kg)     Height 12/06/15 1816 5\' 5"  (1.651 m)     Head Cir --      Peak Flow --      Pain Score 12/06/15 1817 6     Pain Loc --      Pain Edu? --      Excl. in [place]? --     Constitutional: Alert and oriented. Well appearing and in no acute distress. Eyes: Conjunctivae are normal. PERRL. EOMI. Head: Atraumatic. Nose: No congestion/rhinnorhea. Mouth/Throat: Mucous membranes are moist.  Oropharynx non-erythematous. Neck: No stridor.  Supple without meningismus. Cardiovascular: Normal rate, regular rhythm. Grossly normal heart sounds.  Good peripheral circulation. Respiratory: Normal  respiratory effort.  No retractions. Lungs CTAB. Gastrointestinal: Soft and nontender. No distention.No CVA tenderness. Genitourinary: deferred Musculoskeletal: No lower extremity tenderness nor edema. Tiny puncture wound to a superficial varicose vein in the lateral aspect of the right lower leg is hemostatic, no active bleeding. 2+ right DP pulse. Neurologic:  Normal speech and language. No gross focal neurologic deficits are appreciated. No gait instability. Skin:  Skin is warm, dry and intact. No rash noted. Psychiatric: Mood and affect are normal. Speech and behavior are normal.  ____________________________________________   LABS (all labs ordered are listed, but only abnormal results are displayed)  Labs Reviewed  BASIC METABOLIC PANEL - Abnormal; Notable for the following:    Chloride 100 (*)    Glucose, Bld 108 (*)    Creatinine, Ser 1.10 (*)    GFR calc non Af Amer 57 (*)    All other components within normal limits  CBC WITH DIFFERENTIAL/PLATELET   ____________________________________________  EKG  none ____________________________________________  RADIOLOGY  none ____________________________________________   PROCEDURES  Procedure(s) performed: None  Procedures  Critical Care performed: No  ____________________________________________   INITIAL IMPRESSION / ASSESSMENT AND PLAN / ED COURSE  Pertinent labs & imaging results that were available during my care of the patient were reviewed by me and considered in my medical decision making (see  chart for details).  Shirley Rogers is a 50 y.o. female with history of hypertension, history of having completed migraines, recent diagnosis of lupus who presents for evaluation of bleeding vein on  the right leg sustained when she was shaving her leg just prior to arrival. On exam, she is very well-appearing and in no acute distress, vital signs are stable, she is afebrile. Her bleeding has resolved. I suspect she  nicked a tiny superficial varicose vein with her razor while shaving. She is neurovascularly intact in the right leg and appears well. Labs showed normal CBC, no anemia. BMP with mild creatinine elevation at 1.10, I encouraged the patient to push oral fluids. We discussed return precautions, need for PCP follow-up and she is comfortable with the discharge plan. DC home. 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    Joanne Gavel, MD 12/06/15 [phone]

## 2015-12-07 ENCOUNTER — Ambulatory Visit (INDEPENDENT_AMBULATORY_CARE_PROVIDER_SITE_OTHER): Payer: BC Managed Care – PPO | Admitting: Diagnostic Neuroimaging

## 2015-12-07 ENCOUNTER — Encounter: Payer: Self-pay | Admitting: Diagnostic Neuroimaging

## 2015-12-07 VITALS — BP 127/91 | HR 72 | Wt 257.6 lb

## 2015-12-07 DIAGNOSIS — G4733 Obstructive sleep apnea (adult) (pediatric): Secondary | ICD-10-CM

## 2015-12-07 DIAGNOSIS — G43409 Hemiplegic migraine, not intractable, without status migrainosus: Secondary | ICD-10-CM | POA: Diagnosis not present

## 2015-12-07 DIAGNOSIS — M352 Behcet's disease: Secondary | ICD-10-CM

## 2015-12-07 DIAGNOSIS — G932 Benign intracranial hypertension: Secondary | ICD-10-CM | POA: Diagnosis not present

## 2015-12-07 MED ORDER — TOPIRAMATE 50 MG PO TABS: 50.0000 mg | ORAL_TABLET | Freq: Two times a day (BID) | ORAL | Status: DC

## 2015-12-07 NOTE — Patient Instructions (Addendum)
-   start topiramate 50mg  at bedtime; after 1-2 weeks, increase to twice a day - follow up with eye doctor to look for high pressure on optic nerve (papilledema) - follow up with [facility] rheumatology and neurology
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
## 2015-12-08 ENCOUNTER — Encounter: Payer: Self-pay | Admitting: Family Medicine

## 2015-12-08 ENCOUNTER — Ambulatory Visit (INDEPENDENT_AMBULATORY_CARE_PROVIDER_SITE_OTHER): Payer: BC Managed Care – PPO | Admitting: Family Medicine

## 2015-12-08 ENCOUNTER — Other Ambulatory Visit: Payer: Self-pay | Admitting: Family Medicine

## 2015-12-08 VITALS — BP 126/86 | HR 77 | Temp 98.4°F | Ht 64.6 in | Wt 256.0 lb

## 2015-12-08 DIAGNOSIS — F32A Depression, unspecified: Secondary | ICD-10-CM

## 2015-12-08 DIAGNOSIS — Z0000 Encounter for general adult medical examination without abnormal findings: Secondary | ICD-10-CM | POA: Diagnosis not present

## 2015-12-08 DIAGNOSIS — E041 Nontoxic single thyroid nodule: Secondary | ICD-10-CM | POA: Diagnosis not present

## 2015-12-08 DIAGNOSIS — M329 Systemic lupus erythematosus, unspecified: Secondary | ICD-10-CM

## 2015-12-08 DIAGNOSIS — Z124 Encounter for screening for malignant neoplasm of cervix: Secondary | ICD-10-CM

## 2015-12-08 DIAGNOSIS — I10 Essential (primary) hypertension: Secondary | ICD-10-CM

## 2015-12-08 DIAGNOSIS — Z1211 Encounter for screening for malignant neoplasm of colon: Secondary | ICD-10-CM | POA: Diagnosis not present

## 2015-12-08 DIAGNOSIS — Z113 Encounter for screening for infections with a predominantly sexual mode of transmission: Secondary | ICD-10-CM

## 2015-12-08 DIAGNOSIS — F329 Major depressive disorder, single episode, unspecified: Secondary | ICD-10-CM

## 2015-12-08 LAB — MICROSCOPIC EXAMINATION: WBC UA: NONE SEEN /HPF (ref 0–?)

## 2015-12-08 LAB — UA/M W/RFLX CULTURE, ROUTINE
BILIRUBIN UA: NEGATIVE
Glucose, UA: NEGATIVE
KETONES UA: NEGATIVE
LEUKOCYTES UA: NEGATIVE
Nitrite, UA: NEGATIVE
PROTEIN UA: NEGATIVE
SPEC GRAV UA: 1.015 (ref 1.005–1.030)
Urobilinogen, Ur: 2 mg/dL — ABNORMAL HIGH (ref 0.2–1.0)
pH, UA: 7.5 (ref 5.0–7.5)

## 2015-12-08 LAB — MICROALBUMIN, URINE WAIVED
Creatinine, Urine Waived: 50 mg/dL (ref 10–300)
Microalb, Ur Waived: 10 mg/L (ref 0–19)

## 2015-12-08 LAB — OLIGOCLONAL BANDS, CSF + SERM

## 2015-12-08 MED ORDER — BUSPIRONE HCL 5 MG PO TABS: ORAL_TABLET | ORAL | Status: DC

## 2015-12-08 NOTE — Assessment & Plan Note (Signed)
Continue to follow with rheumatology/neurology

## 2015-12-08 NOTE — Patient Instructions (Signed)
Health Maintenance, Female Adopting a healthy lifestyle and getting preventive care can go a long way to promote health and wellness. Talk with your health care provider about what schedule of regular examinations is right for you. This is a good chance for you to check in with your provider about disease prevention and staying healthy. In between checkups, there are plenty of things you can do on your own. Experts have done a lot of research about which lifestyle changes and preventive measures are most likely to keep you healthy. Ask your health care provider for more information. WEIGHT AND DIET  Eat a healthy diet  Be sure to include plenty of vegetables, fruits, low-fat dairy products, and lean protein.  Do not eat a lot of foods high in solid fats, added sugars, or salt.  Get regular exercise. This is one of the most important things you can do for your health.  Most adults should exercise for at least 150 minutes each week. The exercise should increase your heart rate and make you sweat (moderate-intensity exercise).  Most adults should also do strengthening exercises at least twice a week. This is in addition to the moderate-intensity exercise.  Maintain a healthy weight  Body mass index (BMI) is a measurement that can be used to identify possible weight problems. It estimates body fat based on height and weight. Your health care provider can help determine your BMI and help you achieve or maintain a healthy weight.  For females 20 years of age and older:   A BMI below 18.5 is considered underweight.  A BMI of 18.5 to 24.9 is normal.  A BMI of 25 to 29.9 is considered overweight.  A BMI of 30 and above is considered obese.  Watch levels of cholesterol and blood lipids  You should start having your blood tested for lipids and cholesterol at 52 years of age, then have this test every 5 years.  You may need to have your cholesterol levels checked more often if:  Your lipid  or cholesterol levels are high.  You are older than 52 years of age.  You are at high risk for heart disease.  CANCER SCREENING   Lung Cancer  Lung cancer screening is recommended for adults 55-80 years old who are at high risk for lung cancer because of a history of smoking.  A yearly low-dose CT scan of the lungs is recommended for people who:  Currently smoke.  Have quit within the past 15 years.  Have at least a 30-pack-year history of smoking. A pack year is smoking an average of one pack of cigarettes a day for 1 year.  Yearly screening should continue until it has been 15 years since you quit.  Yearly screening should stop if you develop a health problem that would prevent you from having lung cancer treatment.  Breast Cancer  Practice breast self-awareness. This means understanding how your breasts normally appear and feel.  It also means doing regular breast self-exams. Let your health care provider know about any changes, no matter how small.  If you are in your 20s or 30s, you should have a clinical breast exam (CBE) by a health care provider every 1-3 years as part of a regular health exam.  If you are 40 or older, have a CBE every year. Also consider having a breast X-ray (mammogram) every year.  If you have a family history of breast cancer, talk to your health care provider about genetic screening.  If you   are at high risk for breast cancer, talk to your health care provider about having an MRI and a mammogram every year.  Breast cancer gene (BRCA) assessment is recommended for women who have family members with BRCA-related cancers. BRCA-related cancers include:  Breast.  Ovarian.  Tubal.  Peritoneal cancers.  Results of the assessment will determine the need for genetic counseling and BRCA1 and BRCA2 testing. Cervical Cancer Your health care provider may recommend that you be screened regularly for cancer of the pelvic organs (ovaries, uterus, and  vagina). This screening involves a pelvic examination, including checking for microscopic changes to the surface of your cervix (Pap test). You may be encouraged to have this screening done every 3 years, beginning at age 21.  For women ages 30-65, health care providers may recommend pelvic exams and Pap testing every 3 years, or they may recommend the Pap and pelvic exam, combined with testing for human papilloma virus (HPV), every 5 years. Some types of HPV increase your risk of cervical cancer. Testing for HPV may also be done on women of any age with unclear Pap test results.  Other health care providers may not recommend any screening for nonpregnant women who are considered low risk for pelvic cancer and who do not have symptoms. Ask your health care provider if a screening pelvic exam is right for you.  If you have had past treatment for cervical cancer or a condition that could lead to cancer, you need Pap tests and screening for cancer for at least 20 years after your treatment. If Pap tests have been discontinued, your risk factors (such as having a new sexual partner) need to be reassessed to determine if screening should resume. Some women have medical problems that increase the chance of getting cervical cancer. In these cases, your health care provider may recommend more frequent screening and Pap tests. Colorectal Cancer  This type of cancer can be detected and often prevented.  Routine colorectal cancer screening usually begins at 52 years of age and continues through 52 years of age.  Your health care provider may recommend screening at an earlier age if you have risk factors for colon cancer.  Your health care provider may also recommend using home test kits to check for hidden blood in the stool.  A small camera at the end of a tube can be used to examine your colon directly (sigmoidoscopy or colonoscopy). This is done to check for the earliest forms of colorectal  cancer.  Routine screening usually begins at age 50.  Direct examination of the colon should be repeated every 5-10 years through 52 years of age. However, you may need to be screened more often if early forms of precancerous polyps or small growths are found. Skin Cancer  Check your skin from head to toe regularly.  Tell your health care provider about any new moles or changes in moles, especially if there is a change in a mole's shape or color.  Also tell your health care provider if you have a mole that is larger than the size of a pencil eraser.  Always use sunscreen. Apply sunscreen liberally and repeatedly throughout the day.  Protect yourself by wearing long sleeves, pants, a wide-brimmed hat, and sunglasses whenever you are outside. HEART DISEASE, DIABETES, AND HIGH BLOOD PRESSURE   High blood pressure causes heart disease and increases the risk of stroke. High blood pressure is more likely to develop in:  People who have blood pressure in the high end   of the normal range (130-139/85-89 mm Hg).  People who are overweight or obese.  People who are African American.  If you are 38-23 years of age, have your blood pressure checked every 3-5 years. If you are 61 years of age or older, have your blood pressure checked every year. You should have your blood pressure measured twice--once when you are at a hospital or clinic, and once when you are not at a hospital or clinic. Record the average of the two measurements. To check your blood pressure when you are not at a hospital or clinic, you can use:  An automated blood pressure machine at a pharmacy.  A home blood pressure monitor.  If you are between 45 years and 39 years old, ask your health care provider if you should take aspirin to prevent strokes.  Have regular diabetes screenings. This involves taking a blood sample to check your fasting blood sugar level.  If you are at a normal weight and have a low risk for diabetes,  have this test once every three years after 52 years of age.  If you are overweight and have a high risk for diabetes, consider being tested at a younger age or more often. PREVENTING INFECTION  Hepatitis B  If you have a higher risk for hepatitis B, you should be screened for this virus. You are considered at high risk for hepatitis B if:  You were born in a country where hepatitis B is common. Ask your health care provider which countries are considered high risk.  Your parents were born in a high-risk country, and you have not been immunized against hepatitis B (hepatitis B vaccine).  You have HIV or AIDS.  You use needles to inject street drugs.  You live with someone who has hepatitis B.  You have had sex with someone who has hepatitis B.  You get hemodialysis treatment.  You take certain medicines for conditions, including cancer, organ transplantation, and autoimmune conditions. Hepatitis C  Blood testing is recommended for:  Everyone born from [birth year] through 1967.  Anyone with known risk factors for hepatitis C. Sexually transmitted infections (STIs)  You should be screened for sexually transmitted infections (STIs) including gonorrhea and chlamydia if:  You are sexually active and are younger than 52 years of age.  You are older than 53 years of age and your health care provider tells you that you are at risk for this type of infection.  Your sexual activity has changed since you were last screened and you are at an increased risk for chlamydia or gonorrhea. Ask your health care provider if you are at risk.  If you do not have HIV, but are at risk, it may be recommended that you take a prescription medicine daily to prevent HIV infection. This is called pre-exposure prophylaxis (PrEP). You are considered at risk if:  You are sexually active and do not regularly use condoms or know the HIV status of your partner(s).  You take drugs by injection.  You are sexually  active with a partner who has HIV. Talk with your health care provider about whether you are at high risk of being infected with HIV. If you choose to begin PrEP, you should first be tested for HIV. You should then be tested every 3 months for as long as you are taking PrEP.  PREGNANCY   If you are premenopausal and you may become pregnant, ask your health care provider about preconception counseling.  If you may  PrEP). You are considered at risk if:    You are sexually active and do not regularly use condoms or know the HIV status of your partner(s).    You take drugs by injection.    You are sexually  active with a partner who has HIV.  Talk with your health care provider about whether you are at high risk of being infected with HIV. If you choose to begin PrEP, you should first be tested for HIV. You should then be tested every 3 months for as long as you are taking PrEP.   PREGNANCY   · If you are premenopausal and you may become pregnant, ask your health care provider about preconception counseling.  · If you may become pregnant, take 400 to 800 micrograms (mcg) of folic acid every day.  · If you want to prevent pregnancy, talk to your health care provider about birth control (contraception).  OSTEOPOROSIS AND MENOPAUSE   · Osteoporosis is a disease in which the bones lose minerals and strength with aging. This can result in serious bone fractures. Your risk for osteoporosis can be identified using a bone density scan.  · If you are 65 years of age or older, or if you are at risk for osteoporosis and fractures, ask your health care provider if you should be screened.  · Ask your health care provider whether you should take a calcium or vitamin D supplement to lower your risk for osteoporosis.  · Menopause may have certain physical symptoms and risks.  · Hormone replacement therapy may reduce some of these symptoms and risks.  Talk to your health care provider about whether hormone replacement therapy is right for you.   HOME CARE INSTRUCTIONS   · Schedule regular health, dental, and eye exams.  · Stay current with your immunizations.    · Do not use any tobacco products including cigarettes, chewing tobacco, or electronic cigarettes.  · If you are pregnant, do not drink alcohol.  · If you are breastfeeding, limit how much and how often you drink alcohol.  · Limit alcohol intake to no more than 1 drink per day for nonpregnant women. One drink equals 12 ounces of beer, 5 ounces of wine, or 1½ ounces of hard liquor.  · Do not use street drugs.  · Do not share needles.  · Ask your health care provider for help if  you need support or information about quitting drugs.  · Tell your health care provider if you often feel depressed.  · Tell your health care provider if you have ever been abused or do not feel safe at home.     This information is not intended to replace advice given to you by your health care provider. Make sure you discuss any questions you have with your health care provider.     Document Released: 11/29/2010 Document Revised: 06/06/2014 Document Reviewed: 04/17/2013  Elsevier Interactive Patient Education ©2016 [employer]  Menopause is a normal process in which your reproductive ability comes to an end. This process happens gradually over a span of months to years, usually between the ages of 48 and 55. Menopause is complete when you have missed 12 consecutive menstrual periods.  It is important to talk with your health care provider about some of the most common conditions that affect postmenopausal women, such as heart disease, cancer, and bone loss (osteoporosis). Adopting a healthy lifestyle and getting preventive care can help to promote your health and wellness. Those actions can also   lower your chances of developing some of these common conditions.  WHAT SHOULD I KNOW ABOUT MENOPAUSE?  During menopause, you may experience a number of symptoms, such as:  · Moderate-to-severe hot flashes.  · Night sweats.  · Decrease in sex drive.  · Mood swings.  · Headaches.  · Tiredness.  · Irritability.  · Memory problems.  · Insomnia.  Choosing to treat or not to treat menopausal changes is an individual decision that you make with your health care provider.  WHAT SHOULD I KNOW ABOUT HORMONE REPLACEMENT THERAPY AND SUPPLEMENTS?  Hormone therapy products are effective for treating symptoms that are associated with menopause, such as hot flashes and night sweats. Hormone replacement carries certain risks, especially as you become older. If you are thinking about using estrogen or estrogen with progestin treatments,  discuss the benefits and risks with your health care provider.  WHAT SHOULD I KNOW ABOUT HEART DISEASE AND STROKE?  Heart disease, heart attack, and stroke become more likely as you age. This may be due, in part, to the hormonal changes that your body experiences during menopause. These can affect how your body processes dietary fats, triglycerides, and cholesterol. Heart attack and stroke are both medical emergencies.  There are many things that you can do to help prevent heart disease and stroke:  · Have your blood pressure checked at least every 1-2 years. High blood pressure causes heart disease and increases the risk of stroke.  · If you are 55-79 years old, ask your health care provider if you should take aspirin to prevent a heart attack or a stroke.  · Do not use any tobacco products, including cigarettes, chewing tobacco, or electronic cigarettes. If you need help quitting, ask your health care provider.  · It is important to eat a healthy diet and maintain a healthy weight.    Be sure to include plenty of vegetables, fruits, low-fat dairy products, and lean protein.    Avoid eating foods that are high in solid fats, added sugars, or salt (sodium).  · Get regular exercise. This is one of the most important things that you can do for your health.    Try to exercise for at least 150 minutes each week. The type of exercise that you do should increase your heart rate and make you sweat. This is known as moderate-intensity exercise.    Try to do strengthening exercises at least twice each week. Do these in addition to the moderate-intensity exercise.  · Know your numbers. Ask your health care provider to check your cholesterol and your blood glucose. Continue to have your blood tested as directed by your health care provider.  WHAT SHOULD I KNOW ABOUT CANCER SCREENING?  There are several types of cancer. Take the following steps to reduce your risk and to catch any cancer development as early as  possible.  Breast Cancer  · Practice breast self-awareness.    This means understanding how your breasts normally appear and feel.    It also means doing regular breast self-exams. Let your health care provider know about any changes, no matter how small.  · If you are 40 or older, have a clinician do a breast exam (clinical breast exam or CBE) every year. Depending on your age, family history, and medical history, it may be recommended that you also have a yearly breast X-ray (mammogram).  · If you have a family history of breast cancer, talk with your health care provider about genetic screening.  ·   If you are at high risk for breast cancer, talk with your health care provider about having an MRI and a mammogram every year.  · Breast cancer (BRCA) gene test is recommended for women who have family members with BRCA-related cancers. Results of the assessment will determine the need for genetic counseling and BRCA1 and for BRCA2 testing. BRCA-related cancers include these types:    Breast. This occurs in males or females.    Ovarian.    Tubal. This may also be called fallopian tube cancer.    Cancer of the abdominal or pelvic lining (peritoneal cancer).    Prostate.    Pancreatic.  Cervical, Uterine, and Ovarian Cancer  Your health care provider may recommend that you be screened regularly for cancer of the pelvic organs. These include your ovaries, uterus, and vagina. This screening involves a pelvic exam, which includes checking for microscopic changes to the surface of your cervix (Pap test).  · For women ages 21-65, health care providers may recommend a pelvic exam and a Pap test every three years. For women ages 30-65, they may recommend the Pap test and pelvic exam, combined with testing for human papilloma virus (HPV), every five years. Some types of HPV increase your risk of cervical cancer. Testing for HPV may also be done on women of any age who have unclear Pap test results.  · Other health care providers  may not recommend any screening for nonpregnant women who are considered low risk for pelvic cancer and have no symptoms. Ask your health care provider if a screening pelvic exam is right for you.  · If you have had past treatment for cervical cancer or a condition that could lead to cancer, you need Pap tests and screening for cancer for at least 20 years after your treatment. If Pap tests have been discontinued for you, your risk factors (such as having a new sexual partner) need to be reassessed to determine if you should start having screenings again. Some women have medical problems that increase the chance of getting cervical cancer. In these cases, your health care provider may recommend that you have screening and Pap tests more often.  · If you have a family history of uterine cancer or ovarian cancer, talk with your health care provider about genetic screening.  · If you have vaginal bleeding after reaching menopause, tell your health care provider.  · There are currently no reliable tests available to screen for ovarian cancer.  Lung Cancer  Lung cancer screening is recommended for adults 55-80 years old who are at high risk for lung cancer because of a history of smoking. A yearly low-dose CT scan of the lungs is recommended if you:  · Currently smoke.  · Have a history of at least 30 pack-years of smoking and you currently smoke or have quit within the past 15 years. A pack-year is smoking an average of one pack of cigarettes per day for one year.  Yearly screening should:  · Continue until it has been 15 years since you quit.  · Stop if you develop a health problem that would prevent you from having lung cancer treatment.  Colorectal Cancer  · This type of cancer can be detected and can often be prevented.  · Routine colorectal cancer screening usually begins at age 50 and continues through age 75.  · If you have risk factors for colon cancer, your health care provider may recommend that you be  screened at an earlier age.  ·   wearing long sleeves, pants, a wide-brimmed hat, and sunglasses. WHAT SHOULD I KNOW ABOUT OSTEOPOROSIS? Osteoporosis is a condition in which bone destruction happens more quickly than new bone creation. After menopause, you may be at an increased risk for osteoporosis. To help prevent osteoporosis or the bone fractures that can happen because of osteoporosis, the following is recommended:  If you are 54-18 years old, get at least 1,000 mg of calcium and at least 600 mg of vitamin D per day.  If you are older than age 28 but younger than age 11, get at least 1,200 mg of calcium and at least 600 mg of vitamin D per day.  If you are older than  age 65, get at least 1,200 mg of calcium and at least 800 mg of vitamin D per day. Smoking and excessive alcohol intake increase the risk of osteoporosis. Eat foods that are rich in calcium and vitamin D, and do weight-bearing exercises several times each week as directed by your health care provider. WHAT SHOULD I KNOW ABOUT HOW MENOPAUSE AFFECTS [place]? Depression may occur at any age, but it is more common as you become older. Common symptoms of depression include:  Low or sad mood.  Changes in sleep patterns.  Changes in appetite or eating patterns.  Feeling an overall lack of motivation or enjoyment of activities that you previously enjoyed.  Frequent crying spells. Talk with your health care provider if you think that you are experiencing depression. WHAT SHOULD I KNOW ABOUT IMMUNIZATIONS? It is important that you get and maintain your immunizations. These include:  Tetanus, diphtheria, and pertussis (Tdap) booster vaccine.  Influenza every year before the flu season begins.  Pneumonia vaccine.  Shingles vaccine. Your health care provider may also recommend other immunizations.   This information is not intended to replace advice given to you by your health care provider. Make sure you discuss any questions you have with your health care provider.   Document Released: 07/08/2005 Document Revised: 06/06/2014 Document Reviewed: 01/16/2014 Elsevier Interactive Patient Education 2016 [place]. Pneumococcal Vaccine, Polyvalent solution for injection What is this medicine? PNEUMOCOCCAL VACCINE, POLYVALENT (NEU mo KOK al vak SEEN, pol ee VEY luhnt) is a vaccine to prevent pneumococcus bacteria infection. These bacteria are a major cause of ear infections, Strep throat infections, and serious pneumonia, meningitis, or blood infections worldwide. These vaccines help the body to produce antibodies (protective substances) that help your body defend against these bacteria.  This vaccine is recommended for people 40 years of age and older with health problems. It is also recommended for all adults over 86 years old. This vaccine will not treat an infection. This medicine may be used for other purposes; ask your health care provider or pharmacist if you have questions. What should I tell my health care provider before I take this medicine? They need to know if you have any of these conditions: -bleeding problems -bone marrow or organ transplant -cancer, Hodgkin's disease -fever -infection -immune system problems -low platelet count in the blood -seizures -an unusual or allergic reaction to pneumococcal vaccine, diphtheria toxoid, other vaccines, latex, other medicines, foods, dyes, or preservatives -pregnant or trying to get pregnant -breast-feeding How should I use this medicine? This vaccine is for injection into a muscle or under the skin. It is given by a health care professional. A copy of Vaccine Information Statements will be given before each vaccination. Read this sheet carefully each time. The sheet may change frequently. Talk to your  pediatrician regarding the use of this medicine in children. While this drug may be prescribed for children as young as 58 years of age for selected conditions, precautions do apply. Overdosage: If you think you have taken too much of this medicine contact a poison control center or emergency room at once. NOTE: This medicine is only for you. Do not share this medicine with others. What if I miss a dose? It is important not to miss your dose. Call your doctor or health care professional if you are unable to keep an appointment. What may interact with this medicine? -medicines for cancer chemotherapy -medicines that suppress your immune function -medicines that treat or prevent blood clots like warfarin, enoxaparin, and dalteparin -steroid medicines like prednisone or cortisone This list may not describe all possible  interactions. Give your health care provider a list of all the medicines, herbs, non-prescription drugs, or dietary supplements you use. Also tell them if you smoke, drink alcohol, or use illegal drugs. Some items may interact with your medicine. What should I watch for while using this medicine? Mild fever and pain should go away in 3 days or less. Report any unusual symptoms to your doctor or health care professional. What side effects may I notice from receiving this medicine? Side effects that you should report to your doctor or health care professional as soon as possible: -allergic reactions like skin rash, itching or hives, swelling of the face, lips, or tongue -breathing problems -confused -fever over 102 degrees F -pain, tingling, numbness in the hands or feet -seizures -unusual bleeding or bruising -unusual muscle weakness Side effects that usually do not require medical attention (report to your doctor or health care professional if they continue or are bothersome): -aches and pains -diarrhea -fever of 102 degrees F or less -headache -irritable -loss of appetite -pain, tender at site where injected -trouble sleeping This list may not describe all possible side effects. Call your doctor for medical advice about side effects. You may report side effects to FDA at 1-800-FDA-1088. Where should I keep my medicine? This does not apply. This vaccine is given in a clinic, pharmacy, doctor's office, or other health care setting and will not be stored at home. NOTE: This sheet is a summary. It may not cover all possible information. If you have questions about this medicine, talk to your doctor, pharmacist, or health care provider.    2016, Elsevier/Gold Standard. (2007-12-21 14:32:37)

## 2015-12-08 NOTE — Assessment & Plan Note (Signed)
Checking TSH today. Await results.

## 2015-12-08 NOTE — Assessment & Plan Note (Signed)
Doing well on current regimen. Would like her buspar back. No other concerns.

## 2015-12-08 NOTE — Assessment & Plan Note (Signed)
Under good control. Continue current regimen. Continue to monitor. Call with any concerns. 

## 2015-12-08 NOTE — Progress Notes (Signed)
BP 126/86 mmHg  Pulse 77  Temp(Src) 98.4 F (36.9 C)  Ht 5' 4.6" (1.641 m)  Wt 256 lb (116.121 kg)  BMI 43.12 kg/m2  SpO2 96%  LMP 07/01/2013   Subjective:    Patient ID: Shirley Rogers, female    DOB: 23-Aug-1965, 50 y.o.   MRN: [id number]  HPI: Shirley Rogers is a 50 y.o. female presenting on 12/08/2015 for comprehensive medical examination. Current medical complaints include:  HYPERTENSION Hypertension status: controlled  Satisfied with current treatment? yes Duration of hypertension: chronic BP monitoring frequency:  not checking BP medication side effects:  no Medication compliance: excellent compliance Aspirin: no Recurrent headaches: yes Visual changes: no Palpitations: no Dyspnea: no Chest pain: no Lower extremity edema: no Dizzy/lightheaded: no  A little anxious about going back to school. Would like her buspar back, something happened with the Rx refills. Feels like celexa is doing well and doesn't want to change dose.   Working with neurology and rheumatology. Is very frustrated as she is not getting answers, but is working a lot with them to get everything figured out  She currently lives with: friend Menopausal Symptoms: no  Depression Screen done today and results listed below:  Depression screen [facility] 2/9 05/08/2015 04/10/2015 03/30/2015  Decreased Interest 1 0 1  Down, Depressed, Hopeless 0 1 2  PHQ - 2 Score 1 1 3   Altered sleeping - 3 3  Tired, decreased energy - 1 2  Change in appetite - 0 2  Feeling bad or failure about yourself  - 1 1  Trouble concentrating - 2 2  Moving slowly or fidgety/restless - 1 1  Suicidal thoughts - 0 0  PHQ-9 Score - 9 14  Difficult doing work/chores - Somewhat difficult Very difficult   Past Medical History:  Past Medical History  Diagnosis Date  . Barrett esophagus   . Diverticulosis   . Concussion     1974,1982,1987,2016,2016  . 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 Family history of adverse reaction to anesthesia     father - slow to wake  . Hypertension     no meds currently.  . Stroke ([place])     2000 - no deficits  . Hemiplegic migraine     daily  . Migraine   . GERD (gastroesophageal reflux disease)   . Vertigo     from concussive disorder.  seeing neuro 03/23/15  . Sleep apnea     CPAP machine broken  . Vascular spasm ([place])   . Complicated migraine     with facial drooping  . DDD (degenerative disc disease), lumbar     MRI 2008, L3-4, L5-S1, spondylotic changes, multilevel facet joint hypertrophic changes  . Recurrent sinusitis   . Recurrent UTI     question about IC in the past  . Depression     on paxil, lexapro, cymbalta in the past  . Anxiety     on xanax in the past  . IBS (irritable bowel syndrome)   . Fibrocystic breast disease   . Hyperlipidemia   . Colon polyp   . Hemorrhoids   . ASCVD (arteriosclerotic cardiovascular disease)   . Insomnia chronic  . Fatty liver 2013    On CT abdomen/pelvis  . Kidney stone 2013    on CT abdomen/pelvis  . Umbilical hernia 0000000    on CT abdomen/pelvis  . Normal cardiac stress test 2009  EF 66%  . Narrowing of intervertebral disc space 2011    C5-6  . Carotid atherosclerosis 2003    on ultrasound  . Dizziness due to old head injury   . Diverticulosis   . Varicose veins     right lower leg    Surgical History:  Past Surgical History  Procedure Laterality Date  . Tubal ligation    . Breast surgery Right     atypical hyperplasia  . Colonoscopy w/ biopsies      Removed 5 polyps  . Biopsy punch thyroid    . Cholecystectomy N/A 07/24/2015    Procedure: LAPAROSCOPIC CHOLECYSTECTOMY WITH INTRAOPERATIVE CHOLANGIOGRAM;  Surgeon: Leonie Green, MD;  Location: [facility] ORS;  Service: General;  Laterality: N/A;  . Breast excisional biopsy Right 2000    right mass excision - atypical hyperplasia    Medications:  Current Outpatient Prescriptions on File  Prior to Visit  Medication Sig  . citalopram (CELEXA) 20 MG tablet TAKE 1 AND 1/2 TABLETS(30 MG) BY MOUTH DAILY  . EPINEPHrine (EPIPEN 2-PAK) 0.3 mg/0.3 mL IJ SOAJ injection Inject 0.3 mLs (0.3 mg total) into the muscle once.  . gabapentin (NEURONTIN) 300 MG capsule Take 300 mg by mouth 3 (three) times daily.   . hydrochlorothiazide (HYDRODIURIL) 25 MG tablet Take 1 tablet (25 mg total) by mouth daily.  . magnesium oxide (MAG-OX) 400 MG tablet Take 1,200 mg by mouth daily. Reported on 05/14/2015  . omeprazole (PRILOSEC) 40 MG capsule Take 40 mg by mouth daily.  Marland Kitchen PROAIR HFA 108 (90 Base) MCG/ACT inhaler INHALE 2 PUFFS PO Q 4 H PRF WHEEZING  . topiramate (TOPAMAX) 50 MG tablet Take 1 tablet (50 mg total) by mouth 2 (two) times daily.  . traMADol (ULTRAM) 50 MG tablet TAKE 1 TABLET BY MOUTH EVERY 8 HOURS AS NEEDED.  Marland Kitchen traZODone (DESYREL) 100 MG tablet TAKE 1 TABLET(100 MG) BY MOUTH AT BEDTIME AS NEEDED FOR SLEEP  . verapamil (CALAN-SR) 240 MG CR tablet 240 mg daily.   Current Facility-Administered Medications on File Prior to Visit  Medication  . gadopentetate dimeglumine (MAGNEVIST) injection 20 mL    Allergies:  Allergies  Allergen Reactions  . Rocephin [Ceftriaxone] Anaphylaxis  . Compazine [Prochlorperazine] Other (See Comments)    akathesia  . Doxycycline Nausea And Vomiting  . Erythromycin Itching, Nausea And Vomiting and Rash    Patient states she is allergic to all mycin drugs  . Statins Diarrhea and Nausea And Vomiting  . Sulfa Antibiotics Diarrhea and Nausea And Vomiting  . Meloxicam   . Ace Inhibitors Itching  . Biaxin [Clarithromycin] Itching, Nausea And Vomiting and Rash  . Latex Rash  . Morphine And Related Nausea And Vomiting    Social History:  Social History   Social History  . Marital Status: Divorced    Spouse Name: N/A  . Number of Children: 1  . Years of Education: 16   Occupational History  .      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 Smoking status: Former Smoker -- 0.25 packs/day for 1 years    Types: Cigarettes    Quit date: 06/16/2015  . Smokeless tobacco: Never Used     Comment: 1-2 cigarettes approx 3 x/wk  . Alcohol Use: 0.0 oz/week    0 Standard drinks or equivalent per week     Comment: social  . Drug Use: No  . Sexual Activity: No   Other Topics Concern  .  Not on file   Social History Narrative   Lives at home with friend   Caffeine use- coffee 1 cup daily   History  Smoking status  . Former Smoker -- 0.25 packs/day for 1 years  . Types: Cigarettes  . Quit date: 06/16/2015  Smokeless tobacco  . Never Used    Comment: 1-2 cigarettes approx 3 x/wk   History  Alcohol Use  . 0.0 oz/week  . 0 Standard drinks or equivalent per week    Comment: social    Family History:  Family History  Problem Relation Age of Onset  . Other Mother     IBS  . Hypertension Mother   . Heart disease Father   . Diverticulosis Father   . Migraines Father   . Other Son     Ulcerative Colitis, Polonidal cyst  . Parkinson's disease Maternal Grandmother   . Cancer Maternal Grandmother   . Mental illness Paternal Grandmother   . Stroke Paternal Grandmother   . Cancer Paternal Grandmother     brain cancer  . Bipolar disorder Paternal Grandmother   . Stroke Maternal Grandfather   . Cancer Maternal Grandfather     Bladder, Prostate/bladder/prostate  . Macular degeneration    . Heart disease Paternal Grandfather   . Heart attack Paternal Grandfather   . Diabetes Brother   . Breast cancer Maternal Aunt     Past medical history, surgical history, medications, allergies, family history and social history reviewed with patient today and changes made to appropriate areas of the chart.   Review of Systems  Constitutional: Positive for chills. Negative for fever, weight loss, malaise/fatigue and diaphoresis.  HENT: Positive for tinnitus. Negative for congestion, ear discharge, ear pain, hearing loss,  nosebleeds and sore throat.   Eyes: Positive for blurred vision and double vision. Negative for photophobia, pain, discharge and redness.  Respiratory: Positive for shortness of breath and wheezing (with the heat). Negative for cough, hemoptysis, sputum production and stridor.   Cardiovascular: Positive for leg swelling. Negative for chest pain, palpitations, orthopnea, claudication and PND.  Gastrointestinal: Positive for nausea, abdominal pain (chronic no changes), diarrhea and constipation. Negative for heartburn, vomiting, blood in stool and melena.  Genitourinary: Negative.   Musculoskeletal: Positive for myalgias, back pain, joint pain, falls and neck pain.  Skin: Negative.   Neurological: Positive for dizziness, tingling, tremors, focal weakness and headaches. Negative for sensory change, speech change, seizures, loss of consciousness and weakness.  Endo/Heme/Allergies: Positive for polydipsia. Negative for environmental allergies. Bruises/bleeds easily.  Psychiatric/Behavioral: Positive for depression. Negative for suicidal ideas, hallucinations, memory loss and substance abuse. The patient is not nervous/anxious and does not have insomnia.     All other ROS negative except what is listed above and in the HPI.      Objective:    BP 126/86 mmHg  Pulse 77  Temp(Src) 98.4 F (36.9 C)  Ht 5' 4.6" (1.641 m)  Wt 256 lb (116.121 kg)  BMI 43.12 kg/m2  SpO2 96%  LMP 07/01/2013  Wt Readings from Last 3 Encounters:  12/08/15 256 lb (116.121 kg)  12/07/15 257 lb 9.6 oz (116.847 kg)  12/06/15 240 lb (108.863 kg)    Physical Exam  Constitutional: She is oriented to person, place, and time. She appears well-developed and well-nourished. No distress.  HENT:  Head: Normocephalic and atraumatic.  Right Ear: Hearing and external ear normal.  Left Ear: Hearing and external ear normal.  Nose: Nose normal.  Mouth/Throat: Oropharynx is clear and moist.  No oropharyngeal exudate.  Eyes:  Conjunctivae, EOM and lids are normal. Pupils are equal, round, and reactive to light. Right eye exhibits no discharge. Left eye exhibits no discharge. No scleral icterus.  Neck: Normal range of motion. Neck supple. No JVD present. No tracheal deviation present. No thyromegaly present.  Cardiovascular: Normal rate, regular rhythm, normal heart sounds and intact distal pulses.  Exam reveals no gallop and no friction rub.   No murmur heard. Pulmonary/Chest: Effort normal and breath sounds normal. No stridor. No respiratory distress. She has no wheezes. She has no rales. She exhibits no tenderness. Right breast exhibits no inverted nipple, no mass, no nipple discharge, no skin change and no tenderness. Left breast exhibits no inverted nipple, no mass, no nipple discharge, no skin change and no tenderness. Breasts are symmetrical.  Abdominal: Soft. Bowel sounds are normal. She exhibits no distension and no mass. There is no tenderness. There is no rebound and no guarding. Hernia confirmed negative in the right inguinal area and confirmed negative in the left inguinal area.  Genitourinary: Uterus normal. No labial fusion. There is no rash, tenderness, lesion or injury on the right labia. There is no rash, tenderness, lesion or injury on the left labia. Uterus is not deviated, not enlarged, not fixed and not tender. Cervix exhibits discharge. Cervix exhibits no motion tenderness and no friability. Right adnexum displays no mass, no tenderness and no fullness. Left adnexum displays no mass, no tenderness and no fullness. No erythema, tenderness or bleeding in the vagina. No foreign body around the vagina. No signs of injury around the vagina. Vaginal discharge found.  Musculoskeletal: Normal range of motion. She exhibits no edema or tenderness.  Lymphadenopathy:    She has no cervical adenopathy.  Neurological: She is alert and oriented to person, place, and time. She has normal reflexes. She displays normal  reflexes. No cranial nerve deficit. She exhibits normal muscle tone. Coordination normal.  Skin: Skin is warm, dry and intact. No rash noted. She is not diaphoretic. No erythema. No pallor.  Psychiatric: She has a normal mood and affect. Her speech is normal and behavior is normal. Judgment and thought content normal. Cognition and memory are normal.  Nursing note and vitals reviewed.   Results for orders placed or performed during the hospital encounter of 12/06/15  CBC with Differential  Result Value Ref Range   WBC 5.9 3.6 - 11.0 K/uL   RBC 4.46 3.80 - 5.20 MIL/uL   Hemoglobin 13.8 12.0 - 16.0 g/dL   HCT 39.5 35.0 - 47.0 %   MCV 88.6 80.0 - 100.0 fL   MCH 30.9 26.0 - 34.0 pg   MCHC 34.9 32.0 - 36.0 g/dL   RDW 13.1 11.5 - 14.5 %   Platelets 238 150 - 440 K/uL   Neutrophils Relative % 53 %   Neutro Abs 3.1 1.4 - 6.5 K/uL   Lymphocytes Relative 36 %   Lymphs Abs 2.2 1.0 - 3.6 K/uL   Monocytes Relative 7 %   Monocytes Absolute 0.4 0.2 - 0.9 K/uL   Eosinophils Relative 2 %   Eosinophils Absolute 0.1 0 - 0.7 K/uL   Basophils Relative 2 %   Basophils Absolute 0.1 0 - 0.1 K/uL  Basic metabolic panel  Result Value Ref Range   Sodium 139 135 - 145 mmol/L   Potassium 3.5 3.5 - 5.1 mmol/L   Chloride 100 (L) 101 - 111 mmol/L   CO2 31 22 - 32 mmol/L   Glucose, Bld  108 (H) 65 - 99 mg/dL   BUN 15 6 - 20 mg/dL   Creatinine, Ser 1.10 (H) 0.44 - 1.00 mg/dL   Calcium 9.0 8.9 - 10.3 mg/dL   GFR calc non Af Amer 57 (L) >60 mL/min   GFR calc Af Amer >60 >60 mL/min   Anion gap 8 5 - 15      Assessment & Plan:   Problem List Items Addressed This Visit      Endocrine   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 Await results.         Genitourinary   Benign hypertensive renal disease    Under good control. Continue current regimen. Continue to monitor. Call with any concerns.         Other   Lupus ([place])    Continue to follow with rheumatology/neurology       Depression    Doing well on current regimen. Would like her buspar back. No other concerns.       Relevant Medications   busPIRone (BUSPAR) 5 MG tablet    Other Visit Diagnoses    Routine general medical examination at a health care facility    -  Primary    Up to date on vaccines. Screening labs checked today. Mammo up to date. Colon due- ordered today. Pap done today. Work on diet and exercise.     Screening for STD (sexually transmitted disease)        Labs checked today. Await results.     Relevant Orders    GC/Chlamydia Probe Amp    Hepatitis C antibody    HSV(herpes simplex vrs) 1+2 ab-IgG    HIV antibody    RPR    Colon cancer screening        Due for repeat- order put in today.    Relevant Orders    Ambulatory referral to General Surgery    Screening for cervical cancer        Pap done today.    Relevant Orders    IGP, Aptima HPV, rfx 16/18,45        Follow up plan: Return in about 3 months (around 03/09/2016) for Follow up.   LABORATORY TESTING:  - Pap smear: pap done  IMMUNIZATIONS:   - Tdap: Tetanus vaccination status reviewed: last tetanus booster within 10 years. - Influenza: Postponed to flu season - Pneumovax: Will consider- information given today - Prevnar: Not applicable - Zostavax vaccine: Not applicable  SCREENING: -Mammogram: Up to date  - Colonoscopy: Ordered today  - Bone Density: Not applicable  -Hearing Test: Not applicable  -Spirometry: Not applicable   PATIENT COUNSELING:   Advised to take 1 mg of folate supplement per day if capable of pregnancy.   Sexuality: Discussed sexually transmitted diseases, partner selection, use of condoms, avoidance of unintended pregnancy  and contraceptive alternatives.   Advised to avoid cigarette smoking.  I discussed with the patient that most people either abstain from alcohol or drink within safe limits (<=14/week and <=4 drinks/occasion for males, <=7/weeks and <= 3 drinks/occasion for females)  and that the risk for alcohol disorders and other health effects rises proportionally with the number of drinks per week and how often a drinker exceeds daily limits.  Discussed cessation/primary prevention of drug use and availability of treatment for abuse.   Diet: Encouraged to adjust caloric intake to maintain  or achieve ideal body weight, to reduce intake of dietary saturated fat and total  fat, to limit sodium intake by avoiding high sodium foods and not adding table salt, and to maintain adequate dietary potassium and calcium preferably from fresh fruits, vegetables, and low-fat dairy products.    stressed the importance of regular exercise  Injury prevention: Discussed safety belts, safety helmets, smoke detector, smoking near bedding or upholstery.   Dental health: Discussed importance of regular tooth brushing, flossing, and dental visits.    NEXT PREVENTATIVE PHYSICAL DUE IN 1 YEAR. Return in about 3 months (around 03/09/2016) for Follow up.

## 2015-12-09 LAB — COMPREHENSIVE METABOLIC PANEL
ALT: 18 IU/L (ref 0–32)
AST: 15 IU/L (ref 0–40)
Albumin/Globulin Ratio: 1.8 (ref 1.2–2.2)
Albumin: 4.4 g/dL (ref 3.5–5.5)
Alkaline Phosphatase: 62 IU/L (ref 39–117)
BUN/Creatinine Ratio: 21 (ref 9–23)
BUN: 20 mg/dL (ref 6–24)
Bilirubin Total: 0.3 mg/dL (ref 0.0–1.2)
CALCIUM: 9.5 mg/dL (ref 8.7–10.2)
CO2: 28 mmol/L (ref 18–29)
CREATININE: 0.94 mg/dL (ref 0.57–1.00)
Chloride: 96 mmol/L (ref 96–106)
GFR calc Af Amer: 81 mL/min/{1.73_m2} (ref >59)
GFR, EST NON AFRICAN AMERICAN: 70 mL/min/{1.73_m2} (ref >59)
Globulin, Total: 2.4 g/dL (ref 1.5–4.5)
Glucose: 143 mg/dL — ABNORMAL HIGH (ref 65–99)
Potassium: 4.4 mmol/L (ref 3.5–5.2)
Sodium: 140 mmol/L (ref 134–144)
TOTAL PROTEIN: 6.8 g/dL (ref 6.0–8.5)

## 2015-12-09 LAB — CBC WITH DIFFERENTIAL/PLATELET
Basophils Absolute: 0 10*3/uL (ref 0.0–0.2)
Basos: 1 %
EOS (ABSOLUTE): 0.1 10*3/uL (ref 0.0–0.4)
EOS: 2 %
Hematocrit: 40.6 % (ref 34.0–46.6)
Hemoglobin: 13.6 g/dL (ref 11.1–15.9)
IMMATURE GRANS (ABS): 0 10*3/uL (ref 0.0–0.1)
IMMATURE GRANULOCYTES: 1 %
LYMPHS: 30 %
Lymphocytes Absolute: 1.4 10*3/uL (ref 0.7–3.1)
MCH: 30.1 pg (ref 26.6–33.0)
MCHC: 33.5 g/dL (ref 31.5–35.7)
MCV: 90 fL (ref 79–97)
MONOS ABS: 0.4 10*3/uL (ref 0.1–0.9)
Monocytes: 8 %
NEUTROS PCT: 58 %
Neutrophils Absolute: 2.8 10*3/uL (ref 1.4–7.0)
PLATELETS: 228 10*3/uL (ref 150–379)
RBC: 4.52 x10E6/uL (ref 3.77–5.28)
RDW: 13.8 % (ref 12.3–15.4)
WBC: 4.7 10*3/uL (ref 3.4–10.8)

## 2015-12-09 LAB — HIV ANTIBODY (ROUTINE TESTING W REFLEX): HIV SCREEN 4TH GENERATION: NONREACTIVE

## 2015-12-09 LAB — LIPID PANEL W/O CHOL/HDL RATIO
Cholesterol, Total: 281 mg/dL — ABNORMAL HIGH (ref 100–199)
HDL: 56 mg/dL (ref >39)
LDL CALC: 187 mg/dL — AB (ref 0–99)
TRIGLYCERIDES: 191 mg/dL — AB (ref 0–149)
VLDL CHOLESTEROL CAL: 38 mg/dL (ref 5–40)

## 2015-12-09 LAB — HSV(HERPES SIMPLEX VRS) I + II AB-IGG: HSV 1 Glycoprotein G Ab, IgG: 15.9 index — ABNORMAL HIGH (ref 0.00–0.90)

## 2015-12-09 LAB — HEPATITIS C ANTIBODY: Hep C Virus Ab: <0.1 s/co ratio (ref 0.0–0.9)

## 2015-12-09 LAB — TSH: TSH: 0.857 u[IU]/mL (ref 0.450–4.500)

## 2015-12-09 LAB — RPR: RPR Ser Ql: NONREACTIVE

## 2015-12-10 LAB — IGP, APTIMA HPV, RFX 16/18,45
HPV Aptima: POSITIVE — AB
HPV GENOTYPE 16: NEGATIVE
HPV Genotype 18,45: NEGATIVE
PAP SMEAR COMMENT: 0

## 2015-12-11 ENCOUNTER — Telehealth: Payer: Self-pay | Admitting: Family Medicine

## 2015-12-11 LAB — GC/CHLAMYDIA PROBE AMP
CHLAMYDIA, DNA PROBE: NEGATIVE
NEISSERIA GONORRHOEAE BY PCR: NEGATIVE

## 2015-12-11 MED ORDER — EZETIMIBE 10 MG PO TABS: 10.0000 mg | ORAL_TABLET | Freq: Every day | ORAL | Status: DC

## 2015-12-11 NOTE — Telephone Encounter (Signed)
Called and [facility] for her to call me back. Her labs were good with a couple of exceptions. Her cholesterol was really really high. I know she's had bad reactions to statins, but she should be on something. Has she tried zetia? If she hasn't I'd like to see how it works with her. Her STD testing was all negative, except she has been exposed to the cold sore virus. Her pap was negative, but her HPV was positive, so we will have to repeat it next year. OK to give her this message when she calls, then I'll release her labs to [facility]. Thanks!

## 2015-12-11 NOTE — Telephone Encounter (Signed)
Patient notified. Please send cholesterol medication to [facility]

## 2015-12-11 NOTE — Telephone Encounter (Signed)
Rx sent to her pharmacy 

## 2015-12-14 ENCOUNTER — Encounter: Payer: Self-pay | Admitting: *Deleted

## 2015-12-14 ENCOUNTER — Emergency Department
Admission: EM | Admit: 2015-12-14 | Discharge: 2015-12-14 | Disposition: A | Discharge status: Home / Self Care | Payer: BC Managed Care – PPO | Attending: Emergency Medicine | Admitting: Emergency Medicine

## 2015-12-14 DIAGNOSIS — G43809 Other migraine, not intractable, without status migrainosus: Secondary | ICD-10-CM | POA: Diagnosis not present

## 2015-12-14 DIAGNOSIS — Z8673 Personal history of transient ischemic attack (TIA), and cerebral infarction without residual deficits: Secondary | ICD-10-CM | POA: Diagnosis not present

## 2015-12-14 DIAGNOSIS — Z87891 Personal history of nicotine dependence: Secondary | ICD-10-CM | POA: Insufficient documentation

## 2015-12-14 DIAGNOSIS — R51 Headache: Secondary | ICD-10-CM | POA: Diagnosis present

## 2015-12-14 DIAGNOSIS — I10 Essential (primary) hypertension: Secondary | ICD-10-CM | POA: Insufficient documentation

## 2015-12-14 DIAGNOSIS — E785 Hyperlipidemia, unspecified: Secondary | ICD-10-CM | POA: Insufficient documentation

## 2015-12-14 DIAGNOSIS — Z91040 Latex allergy status: Secondary | ICD-10-CM | POA: Diagnosis not present

## 2015-12-14 DIAGNOSIS — Z79899 Other long term (current) drug therapy: Secondary | ICD-10-CM | POA: Diagnosis not present

## 2015-12-14 DIAGNOSIS — F329 Major depressive disorder, single episode, unspecified: Secondary | ICD-10-CM | POA: Diagnosis not present

## 2015-12-14 DIAGNOSIS — Z8679 Personal history of other diseases of the circulatory system: Secondary | ICD-10-CM | POA: Insufficient documentation

## 2015-12-14 DIAGNOSIS — G43109 Migraine with aura, not intractable, without status migrainosus: Secondary | ICD-10-CM

## 2015-12-14 MED ORDER — KETOROLAC TROMETHAMINE 30 MG/ML IJ SOLN
15.0000 mg | INTRAMUSCULAR | Status: AC
  Administered 2015-12-14: 15 mg via INTRAVENOUS
  Filled 2015-12-14: qty 1

## 2015-12-14 MED ORDER — METOCLOPRAMIDE HCL 5 MG/ML IJ SOLN
10.0000 mg | Freq: Once | INTRAMUSCULAR | Status: AC
  Administered 2015-12-14: 10 mg via INTRAVENOUS
  Filled 2015-12-14: qty 2

## 2015-12-14 MED ORDER — DIPHENHYDRAMINE HCL 50 MG/ML IJ SOLN
50.0000 mg | Freq: Once | INTRAMUSCULAR | Status: AC
  Administered 2015-12-14: 50 mg via INTRAVENOUS
  Filled 2015-12-14: qty 1

## 2015-12-14 MED ORDER — METOCLOPRAMIDE HCL 10 MG PO TABS: 10.0000 mg | ORAL_TABLET | Freq: Three times a day (TID) | ORAL | Status: DC

## 2015-12-14 MED ORDER — METHYLPREDNISOLONE SODIUM SUCC 125 MG IJ SOLR
125.0000 mg | Freq: Once | INTRAMUSCULAR | Status: AC
  Administered 2015-12-14: 125 mg via INTRAVENOUS
  Filled 2015-12-14: qty 2

## 2015-12-14 MED ORDER — MAGNESIUM SULFATE 2 GM/50ML IV SOLN
2.0000 g | Freq: Once | INTRAVENOUS | Status: AC
  Administered 2015-12-14: 2 g via INTRAVENOUS
  Filled 2015-12-14: qty 50

## 2015-12-14 MED ORDER — SODIUM CHLORIDE 0.9 % IV BOLUS (SEPSIS)
1000.0000 mL | Freq: Once | INTRAVENOUS | Status: AC
  Administered 2015-12-14: 1000 mL via INTRAVENOUS

## 2015-12-14 MED ORDER — DIPHENHYDRAMINE HCL 25 MG PO CAPS: 50.0000 mg | ORAL_CAPSULE | Freq: Four times a day (QID) | ORAL | Status: DC | PRN

## 2015-12-14 NOTE — ED Provider Notes (Signed)
[facility] Emergency Department Provider Note  ____________________________________________  Time seen: 10:10 AM  I have reviewed the triage vital signs and the nursing notes.   HISTORY  Chief Complaint Migraine and Head Injury    HPI Shirley Rogers is a 50 y.o. female complains of a headache on the left side of her head associated with right facial and right upper extremity weakness. She reports that she has a history of hemiplegic migraines and that this is entirely typical for her. There are no new or unusual features for her. 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 She also does report that she slipped and fell in her bathtub yesterday and hit her head. She did not have a loss of consciousness but had been having persistent headaches since then and was told when she walked in to school this morning that she was walking a little bit "crooked".  Headache worsened by light. No alleviating factors. Severe in intensity. Left parietal nonradiating     Past Medical History  Diagnosis Date  . Barrett esophagus   . Diverticulosis   . Concussion     1974,1982,1987,2016,2016  . 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 Family history of adverse reaction to anesthesia     father - slow to wake  . Hypertension     no meds currently.  . Stroke ([place])     2000 - no deficits  . Hemiplegic migraine     daily  . Migraine   . GERD (gastroesophageal reflux disease)   . Vertigo     from concussive disorder.  seeing neuro 03/23/15  . Sleep apnea     CPAP machine broken  . Vascular spasm ([place])   . Complicated migraine     with facial drooping  . DDD (degenerative disc disease), lumbar     MRI 2008, L3-4, L5-S1, spondylotic changes, multilevel facet joint hypertrophic changes  . Recurrent sinusitis   . Recurrent  UTI     question about IC in the past  . Depression     on paxil, lexapro, cymbalta in the past  . Anxiety     on xanax in the past  . IBS (irritable bowel syndrome)   . Fibrocystic breast disease   . Hyperlipidemia   . Colon polyp   . Hemorrhoids   . ASCVD (arteriosclerotic cardiovascular disease)   . Insomnia chronic  . Fatty liver 2013    On CT abdomen/pelvis  . Kidney stone 2013    on CT abdomen/pelvis  . Umbilical hernia 0000000    on CT abdomen/pelvis  . Normal cardiac stress test 2009    EF 66%  . Narrowing of intervertebral disc space 2011    C5-6  . Carotid atherosclerosis 2003    on ultrasound  . Dizziness due to old head injury   . Diverticulosis   . Varicose veins     right lower leg     Patient Active Problem List   Diagnosis Date Noted  . Tobacco use 12/03/2015  . ANA positive 10/19/2015  . Transient alteration of awareness 09/24/2015  . Snoring 06/10/2015  . OSA on CPAP 06/10/2015  . 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 Globus sensation 03/30/2015  . Post-concussion syndrome 03/13/2015  . Depression 03/13/2015  .  Benign hypertensive renal disease   . Barrett esophagus   . Diverticulosis   . Hemiplegic migraine   . Lupus ([place])   . Stroke [facility])      Past Surgical History  Procedure Laterality Date  . Tubal ligation    . Breast surgery Right     atypical hyperplasia  . Colonoscopy w/ biopsies      Removed 5 polyps  . Biopsy punch thyroid    . Cholecystectomy N/A 07/24/2015    Procedure: LAPAROSCOPIC CHOLECYSTECTOMY WITH INTRAOPERATIVE CHOLANGIOGRAM;  Surgeon: Leonie Green, MD;  Location: [facility] ORS;  Service: General;  Laterality: N/A;  . Breast excisional biopsy Right 2000    right mass excision - atypical hyperplasia     Current Outpatient Rx  Name  Route  Sig  Dispense  Refill  . albuterol (PROVENTIL HFA;VENTOLIN HFA) 108 (90 Base) MCG/ACT inhaler   Inhalation   Inhale 2 puffs  into the lungs every 4 (four) hours as needed for wheezing or shortness of breath.         . busPIRone (BUSPAR) 5 MG tablet   Oral   Take 5-15 mg by mouth 3 (three) times daily as needed.         . citalopram (CELEXA) 20 MG tablet   Oral   Take 30 mg by mouth daily.         Marland Kitchen EPINEPHrine (EPIPEN 2-PAK) 0.3 mg/0.3 mL IJ SOAJ injection   Intramuscular   Inject 0.3 mLs (0.3 mg total) into the muscle once.   1 Device   12   . ezetimibe (ZETIA) 10 MG tablet   Oral   Take 1 tablet (10 mg total) by mouth daily.   90 tablet   1   . gabapentin (NEURONTIN) 300 MG capsule   Oral   Take 300 mg by mouth 3 (three) times daily.          . hydrochlorothiazide (HYDRODIURIL) 25 MG tablet   Oral   Take 1 tablet (25 mg total) by mouth daily.   90 tablet   1   . magnesium oxide (MAG-OX) 400 MG tablet   Oral   Take 1,200 mg by mouth daily. Reported on 05/14/2015         . omeprazole (PRILOSEC) 40 MG capsule   Oral   Take 40 mg by mouth daily.         Marland Kitchen topiramate (TOPAMAX) 50 MG tablet   Oral   Take 1 tablet (50 mg total) by mouth 2 (two) times daily.   60 tablet   12   . traMADol (ULTRAM) 50 MG tablet   Oral   Take 50 mg by mouth every 8 (eight) hours as needed.         . traZODone (DESYREL) 100 MG tablet   Oral   Take 100 mg by mouth at bedtime as needed for sleep.         . verapamil (CALAN-SR) 240 MG CR tablet   Oral   Take 240 mg by mouth daily.          . diphenhydrAMINE (BENADRYL) 25 mg capsule   Oral   Take 2 capsules (50 mg total) by mouth every 6 (six) hours as needed.   60 capsule   0   . metoCLOPramide (REGLAN) 10 MG tablet   Oral   Take 1 tablet (10 mg total) by mouth 4 (four) times daily -  before meals and at bedtime.   60 tablet  0      Allergies Rocephin; Compazine; Doxycycline; Erythromycin; Statins; Sulfa antibiotics; Meloxicam; Ace inhibitors; Biaxin; Latex; and Morphine and related   Family History  Problem Relation Age  of Onset  . Other Mother     IBS  . Hypertension Mother   . Heart disease Father   . Diverticulosis Father   . Migraines Father   . Other Son     Ulcerative Colitis, Polonidal cyst  . Parkinson's disease Maternal Grandmother   . Cancer Maternal Grandmother   . Mental illness Paternal Grandmother   . Stroke Paternal Grandmother   . Cancer Paternal Grandmother     brain cancer  . Bipolar disorder Paternal Grandmother   . Stroke Maternal Grandfather   . Cancer Maternal Grandfather     Bladder, Prostate/bladder/prostate  . Macular degeneration    . Heart disease Paternal Grandfather   . Heart attack Paternal Grandfather   . Diabetes Brother   . Breast cancer Maternal Aunt     Social History Social History  Substance Use Topics  . Smoking status: Former Smoker -- 0.25 packs/day for 1 years    Types: Cigarettes    Quit date: 06/16/2015  . Smokeless tobacco: Never Used     Comment: 1-2 cigarettes approx 3 x/wk  . Alcohol Use: 0.0 oz/week    0 Standard drinks or equivalent per week     Comment: social    Review of Systems  Constitutional:   No fever or chills.  ENT:   No sore throat. No rhinorrhea. Cardiovascular:   No chest pain. Respiratory:   No dyspnea or cough. Gastrointestinal:   Negative for abdominal pain, vomiting and diarrhea.  Genitourinary:   Negative for dysuria or difficulty urinating. Musculoskeletal:   Negative for focal pain or swellingNo injuries Neurological:   Positive as above for headache 10-point ROS otherwise negative.  ____________________________________________   PHYSICAL EXAM:  VITAL SIGNS: ED Triage Vitals  Enc Vitals Group     BP 12/14/15 0947 134/74 mmHg     Pulse Rate 12/14/15 0947 83     Resp 12/14/15 0947 16     Temp 12/14/15 0947 99 F (37.2 C)     Temp Source 12/14/15 0947 Oral     SpO2 12/14/15 0947 99 %     Weight 12/14/15 0947 247 lb (112.038 kg)     Height 12/14/15 0947 5\' 5"  (1.651 m)     Head Cir --      Peak Flow  --      Pain Score 12/14/15 0952 10     Pain Loc --      Pain Edu? --      Excl. in [place]? --     Vital signs reviewed, nursing assessments reviewed.   Constitutional:   Alert and oriented. Well appearing and in no distress. Eyes:   No scleral icterus. No conjunctival pallor. PERRL. EOMI.  No nystagmus. ENT   Head:   Normocephalic and atraumatic.   Nose:   No congestion/rhinnorhea. No septal hematoma   Mouth/Throat:   MMM, no pharyngeal erythema. No peritonsillar mass.    Neck:   No stridor. No SubQ emphysema. No meningismus. Hematological/Lymphatic/Immunilogical:   No cervical lymphadenopathy. Cardiovascular:   RRR. Symmetric bilateral radial and DP pulses.  No murmurs.  Respiratory:   Normal respiratory effort without tachypnea nor retractions. Breath sounds are clear and equal bilaterally. No wheezes/rales/rhonchi. Gastrointestinal:   Soft and nontender. Non distended. There is no CVA tenderness.  No rebound, rigidity,  or guarding. Genitourinary:   deferred Musculoskeletal:   Nontender with normal range of motion in all extremities. No joint effusions.  No lower extremity tenderness.  No edema. Neurologic:   Normal speech and language.  CNExam reveals right facial droop and tongue deviation to the right. Motor exam reveals 4 out of 5 strength in right upper extremity in all major muscle groups. Left upper extremity and bilateral lower extremities are 5 out of 5 strength. Finger to nose testing is normal bilaterally. No pronator drift.. No gross focal neurologic deficits are appreciated.  Skin:    Skin is warm, dry and intact. No rash noted.  No petechiae, purpura, or bullae.  ____________________________________________    LABS (pertinent positives/negatives) (all labs ordered are listed, but only abnormal results are displayed) Labs Reviewed - No data to  display ____________________________________________   EKG    ____________________________________________    RADIOLOGY    ____________________________________________   PROCEDURES   ____________________________________________   INITIAL IMPRESSION / ASSESSMENT AND PLAN / ED COURSE  Pertinent labs & imaging results that were available during my care of the patient were reviewed by me and considered in my medical decision making (see chart for details).  Patient presents with headache and right facial and right upper extremity weakness. According the patient this is entirely typical of her hemiplegic migraines that she has had for quite some time. I have seen the patient previously for the same presentation and medical history. She reports multiple CT scans within the past year. Due to the recent fall and other symptoms prior to the onset of migraine headache, I recommended a CT scan of her head today, but the patient declines. She does note that she will be following up with many doctors in the next few weeks and is scheduled for CT angiogram of her brain later this month. She agrees that if she is feeling better today and able to go home she'll monitor her symptoms closely and return if she has any worsening including confusion or forgetfulness or repeat falls or worsening headache for a CT scan at that time.  For now we will treat with migraine cocktail of IV fluid steroids Reglan and Benadryl Toradol and magnesium.   ----------------------------------------- 12:21 PM on 12/14/2015 -----------------------------------------  Patient calm and comfortable. Feels much better. Neurologic reassessment shows that cranial nerves abnormalities have improved as have her right upper extremity strength. There is slight relative weakness overall she is almost back to baseline. I again counseled the patient to keep a close eye on her symptoms and return if there is any worsening. She'll  follow closely with primary care and neurology.Considering the patient's symptoms, medical history, and physical examination today, I have low suspicion for ischemic stroke, intracranial hemorrhage, meningitis, encephalitis, carotid or vertebral dissection, venous sinus thrombosis, MS, intracranial hypertension, glaucoma, CRAO, CRVO, or temporal arteritis.     ____________________________________________   FINAL CLINICAL IMPRESSION(S) / ED DIAGNOSES  Final diagnoses:  Complicated migraine       Portions of this note were generated with dragon dictation software. Dictation errors may occur despite best attempts at proofreading.   Carrie Mew, MD 12/14/15 [phone]

## 2015-12-14 NOTE — Discharge Instructions (Signed)
Recurrent Migraine Headache A migraine headache is an intense, throbbing pain on one or both sides of your head. Recurrent migraines keep coming back. A migraine can last for 30 minutes to several hours. CAUSES  The exact cause of a migraine headache is not always known. However, a migraine may be caused when nerves in the brain become irritated and release chemicals that cause inflammation. This causes pain. Certain things may also trigger migraines, such as:   Alcohol.  Smoking.  Stress.  Menstruation.  Aged cheeses.  Foods or drinks that contain nitrates, glutamate, aspartame, or tyramine.  Lack of sleep.  Chocolate.  Caffeine.  Hunger.  Physical exertion.  Fatigue.  Medicines used to treat chest pain (nitroglycerine), birth control pills, estrogen, and some blood pressure medicines. SYMPTOMS   Pain on one or both sides of your head.  Pulsating or throbbing pain.  Severe pain that prevents daily activities.  Pain that is aggravated by any physical activity.  Nausea, vomiting, or both.  Dizziness.  Pain with exposure to bright lights, loud noises, or activity.  General sensitivity to bright lights, loud noises, or smells. Before you get a migraine, you may get warning signs that a migraine is coming (aura). An aura may include:  Seeing flashing lights.  Seeing bright spots, halos, or zigzag lines.  Having tunnel vision or blurred vision.  Having feelings of numbness or tingling.  Having trouble talking.  Having muscle weakness. DIAGNOSIS  A recurrent migraine headache is often diagnosed based on:  Symptoms.  Physical examination.  A CT scan or MRI of your head. These imaging tests cannot diagnose migraines but can help rule out other causes of headaches.  TREATMENT  Medicines may be given for pain and nausea. Medicines can also be given to help prevent recurrent migraines. HOME CARE INSTRUCTIONS  Only take over-the-counter or prescription  medicines for pain or discomfort as directed by your health care provider. The use of long-term narcotics is not recommended.  Lie down in a dark, quiet room when you have a migraine.  Keep a journal to find out what may trigger your migraine headaches. For example, write down:  What you eat and drink.  How much sleep you get.  Any change to your diet or medicines.  Limit alcohol consumption.  Quit smoking if you smoke.  Get 7-9 hours of sleep, or as recommended by your health care provider.  Limit stress.  Keep lights dim if bright lights bother you and make your migraines worse. SEEK MEDICAL CARE IF:   You do not get relief from the medicines given to you.  You have a recurrence of pain.  You have a fever. SEEK IMMEDIATE MEDICAL CARE IF:  Your migraine becomes severe.  You have a stiff neck.  You have loss of vision.  You have muscular weakness or loss of muscle control.  You start losing your balance or have trouble walking.  You feel faint or pass out.  You have severe symptoms that are different from your first symptoms. MAKE SURE YOU:   Understand these instructions.  Will watch your condition.  Will get help right away if you are not doing well or get worse.   This information is not intended to replace advice given to you by your health care provider. Make sure you discuss any questions you have with your health care provider.   Document Released: 02/08/2001 Document Revised: 06/06/2014 Document Reviewed: 01/21/2013 Elsevier Interactive Patient Education 2016 [employer]  

## 2015-12-14 NOTE — ED Notes (Signed)
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 Pt also states she slipped in the bath tub yesterday and hit her head.. Denies migraine with other sx until sudden onset today after exposure to chemical smell.

## 2015-12-17 ENCOUNTER — Telehealth: Payer: Self-pay

## 2015-12-17 DIAGNOSIS — G4733 Obstructive sleep apnea (adult) (pediatric): Secondary | ICD-10-CM

## 2015-12-17 NOTE — Telephone Encounter (Signed)
I spoke to pt. I advised her that her cpap titration appeared to be effective in treating pt's sleep apnea and Dr. Brett Fairy recommends that pt start cpap. Pt says that she felt very refreshed and alert after her cpap titration. She is eager to begin cpap. I advised her that I would send the order to [facility]. I made a follow up appt with the pt for 9/28 at 4:00. Pt verbalized understanding of results. Pt had no questions at this time but was encouraged to call back if questions arise.

## 2015-12-18 ENCOUNTER — Telehealth: Payer: Self-pay

## 2015-12-18 NOTE — Telephone Encounter (Signed)
Patient needs to have a colonoscopy screening. I called her 2 times and sent her a notification letter.

## 2015-12-21 ENCOUNTER — Telehealth: Payer: Self-pay

## 2015-12-21 ENCOUNTER — Other Ambulatory Visit: Payer: Self-pay

## 2015-12-21 NOTE — Telephone Encounter (Signed)
Colonoscopy appointment 01/29/2016 [facility] Screening Colonoscopy [id number] Please pre cert

## 2015-12-21 NOTE — Telephone Encounter (Signed)
Gastroenterology Pre-Procedure Review  Request Date: 01/29/2016 Requesting Physician:   PATIENT REVIEW QUESTIONS: The patient responded to the following health history questions as indicated:    1. Are you having any GI issues? yes (Constipation, Diarrhea, Bloaded, 2 weeks) 2. Do you have a personal history of Polyps? yes (benign) 3. Do you have a family history of Colon Cancer or Polyps? no 4. Diabetes Mellitus? no 5. Joint replacements in the past 12 months?no 6. Major health problems in the past 3 months?no 7. Any artificial heart valves, MVP, or defibrillator?no    MEDICATIONS & ALLERGIES:    Patient reports the following regarding taking any anticoagulation/antiplatelet therapy:   Plavix, Coumadin, Eliquis, Xarelto, Lovenox, Pradaxa, Brilinta, or Effient? no Aspirin? no  Patient confirms/reports the following medications:  Current Outpatient Prescriptions  Medication Sig Dispense Refill  . albuterol (PROVENTIL HFA;VENTOLIN HFA) 108 (90 Base) MCG/ACT inhaler Inhale 2 puffs into the lungs every 4 (four) hours as needed for wheezing or shortness of breath.    . busPIRone (BUSPAR) 5 MG tablet Take 5-15 mg by mouth 3 (three) times daily as needed.    . citalopram (CELEXA) 20 MG tablet Take 30 mg by mouth daily.    . diphenhydrAMINE (BENADRYL) 25 mg capsule Take 2 capsules (50 mg total) by mouth every 6 (six) hours as needed. 60 capsule 0  . EPINEPHrine (EPIPEN 2-PAK) 0.3 mg/0.3 mL IJ SOAJ injection Inject 0.3 mLs (0.3 mg total) into the muscle once. 1 Device 12  . ezetimibe (ZETIA) 10 MG tablet Take 1 tablet (10 mg total) by mouth daily. 90 tablet 1  . gabapentin (NEURONTIN) 300 MG capsule Take 300 mg by mouth 3 (three) times daily.     . hydrochlorothiazide (HYDRODIURIL) 25 MG tablet Take 1 tablet (25 mg total) by mouth daily. 90 tablet 1  . magnesium oxide (MAG-OX) 400 MG tablet Take 1,200 mg by mouth daily. Reported on 05/14/2015    . metoCLOPramide (REGLAN) 10 MG tablet Take 1  tablet (10 mg total) by mouth 4 (four) times daily -  before meals and at bedtime. 60 tablet 0  . omeprazole (PRILOSEC) 40 MG capsule Take 40 mg by mouth daily.    Marland Kitchen topiramate (TOPAMAX) 50 MG tablet Take 1 tablet (50 mg total) by mouth 2 (two) times daily. 60 tablet 12  . traMADol (ULTRAM) 50 MG tablet Take 50 mg by mouth every 8 (eight) hours as needed.    . traZODone (DESYREL) 100 MG tablet Take 100 mg by mouth at bedtime as needed for sleep.    . verapamil (CALAN-SR) 240 MG CR tablet Take 240 mg by mouth daily.      No current facility-administered medications for this visit.    Facility-Administered Medications Ordered in Other Visits  Medication Dose Route Frequency Provider Last Rate Last Dose  . gadopentetate dimeglumine (MAGNEVIST) injection 20 mL  20 mL Intravenous Once PRN Penni Bombard, MD        Patient confirms/reports the following allergies:  Allergies  Allergen Reactions  . Rocephin [Ceftriaxone] Anaphylaxis  . Compazine [Prochlorperazine] Other (See Comments)    akathesia  . Doxycycline Nausea And Vomiting  . Erythromycin Itching, Nausea And Vomiting and Rash    Patient states she is allergic to all mycin drugs  . Statins Diarrhea and Nausea And Vomiting  . Sulfa Antibiotics Diarrhea and Nausea And Vomiting  . Meloxicam   . Ace Inhibitors Itching  . Biaxin [Clarithromycin] Itching, Nausea And Vomiting and Rash  . Latex  Rash  . Morphine And Related Nausea And Vomiting    No orders of the defined types were placed in this encounter.   AUTHORIZATION INFORMATION Primary Insurance: 1D#: Group #:  Secondary Insurance: 1D#: Group #:  SCHEDULE INFORMATION: Date: 01/29/2016 Time: Location: [facility]

## 2015-12-21 NOTE — Telephone Encounter (Deleted)
.  colol

## 2015-12-22 ENCOUNTER — Other Ambulatory Visit: Payer: Self-pay | Admitting: Family Medicine

## 2015-12-22 MED ORDER — TRAMADOL HCL 50 MG PO TABS: 50.0000 mg | ORAL_TABLET | Freq: Two times a day (BID) | ORAL | 0 refills | Status: DC | PRN

## 2016-01-01 LAB — FUNGUS CULTURE W SMEAR

## 2016-01-12 ENCOUNTER — Other Ambulatory Visit: Payer: Self-pay | Admitting: Family Medicine

## 2016-01-12 MED ORDER — TRAMADOL HCL 50 MG PO TABS: 50.0000 mg | ORAL_TABLET | Freq: Two times a day (BID) | ORAL | 0 refills | Status: DC | PRN

## 2016-01-14 ENCOUNTER — Ambulatory Visit (INDEPENDENT_AMBULATORY_CARE_PROVIDER_SITE_OTHER): Payer: BC Managed Care – PPO | Admitting: Family Medicine

## 2016-01-14 ENCOUNTER — Encounter: Payer: Self-pay | Admitting: Family Medicine

## 2016-01-14 VITALS — BP 144/90 | HR 77 | Temp 99.2°F | Wt 256.0 lb

## 2016-01-14 DIAGNOSIS — R600 Localized edema: Secondary | ICD-10-CM | POA: Diagnosis not present

## 2016-01-14 MED ORDER — PREDNISONE 20 MG PO TABS: 20.0000 mg | ORAL_TABLET | Freq: Every day | ORAL | 0 refills | Status: DC

## 2016-01-14 MED ORDER — FUROSEMIDE 20 MG PO TABS: 20.0000 mg | ORAL_TABLET | Freq: Every day | ORAL | 0 refills | Status: DC

## 2016-01-14 MED ORDER — LIDOCAINE VISCOUS 2 % MT SOLN: 5.0000 mL | OROMUCOSAL | 6 refills | Status: DC | PRN

## 2016-01-14 NOTE — Patient Instructions (Signed)
Follow up as needed

## 2016-01-14 NOTE — Progress Notes (Addendum)
BP (!) 144/90   Pulse 77   Temp 99.2 F (37.3 C)   Wt 256 lb (116.1 kg)   LMP 07/01/2013   SpO2 97%   BMI 42.60 kg/m    Subjective:    Patient ID: Shirley Rogers, female    DOB: Oct 14, 1965, 50 y.o.   MRN: [id number]  HPI: ANAUTICA MEAGER is a 50 y.o. female  Chief Complaint  Patient presents with  . Leg Swelling    bilateral legs x 1 week.   . Other    She is having a Behcet's Flare, body aches, feels like she is getting mouth ulcers.   Patient presents with 1 week history of b/l LE edema. 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 Had similar symptoms back in April and went to ER where she underwent a full HF workup which was negative. Has reached out to her rheumatologist at [facility] this week about what is going on but has not heard back yet from them.   Relevant past medical, surgical, family and social history reviewed and updated as indicated. Interim medical history since our last visit reviewed. Allergies and medications reviewed and updated.  Review of Systems  Constitutional: Negative.   HENT: Negative.   Respiratory: Negative for shortness of breath.   Cardiovascular: Positive for leg swelling.  Gastrointestinal: Negative.   Genitourinary: Negative.   Musculoskeletal:       Pain in b/l LEs  Neurological: Negative.   Psychiatric/Behavioral: Negative.     Per HPI unless specifically indicated above     Objective:    BP (!) 144/90   Pulse 77   Temp 99.2 F (37.3 C)   Wt 256 lb (116.1 kg)   LMP 07/01/2013   SpO2 97%   BMI 42.60 kg/m   Wt Readings from Last 3 Encounters:  01/14/16 256 lb (116.1 kg)  12/14/15 247 lb (112 kg)  12/08/15 256 lb (116.1 kg)    Physical Exam  Constitutional: She is oriented to person, place, and time. She appears well-developed and well-nourished. No distress.  HENT:  Head: Atraumatic.  Eyes: Conjunctivae are normal. No scleral  icterus.  Neck: Normal range of motion. Neck supple.  Cardiovascular: Normal rate and normal heart sounds.   Pulmonary/Chest: Effort normal. No respiratory distress.  Musculoskeletal: Normal range of motion. She exhibits edema (3 + pitting edema b/l LEs).  Neurological: She is alert and oriented to person, place, and time.  Skin: Skin is warm and dry. No rash noted. No erythema.  Psychiatric: She has a normal mood and affect. Her behavior is normal.  Nursing note and vitals reviewed.       Assessment & Plan:   Problem List Items Addressed This Visit    None    Visit Diagnoses    Bilateral leg edema    -  Primary   Await basic labs and repeat BNP. Prednisone taper and 1 week course of lasix given.    Relevant Orders   CBC with Differential/Platelet   Comprehensive metabolic panel   B Nat Peptide    Patient will continue to reach out to Rheumatology for guidance. Recommended elevation and compression as tolerated. She will follow up via MyChart Monday with how she is doing on these medications and we will evaluate further from there.    Follow up plan: Return if symptoms worsen or fail  to improve.

## 2016-01-15 ENCOUNTER — Encounter: Payer: Self-pay | Admitting: Family Medicine

## 2016-01-15 LAB — CBC WITH DIFFERENTIAL/PLATELET
BASOS ABS: 0 10*3/uL (ref 0.0–0.2)
Basos: 0 %
EOS (ABSOLUTE): 0.1 10*3/uL (ref 0.0–0.4)
EOS: 2 %
HEMATOCRIT: 37.4 % (ref 34.0–46.6)
HEMOGLOBIN: 12.4 g/dL (ref 11.1–15.9)
Immature Grans (Abs): 0 10*3/uL (ref 0.0–0.1)
Immature Granulocytes: 0 %
LYMPHS ABS: 1.5 10*3/uL (ref 0.7–3.1)
Lymphs: 27 %
MCH: 30 pg (ref 26.6–33.0)
MCHC: 33.2 g/dL (ref 31.5–35.7)
MCV: 90 fL (ref 79–97)
MONOCYTES: 8 %
Monocytes Absolute: 0.4 10*3/uL (ref 0.1–0.9)
NEUTROS ABS: 3.4 10*3/uL (ref 1.4–7.0)
Neutrophils: 63 %
Platelets: 248 10*3/uL (ref 150–379)
RBC: 4.14 x10E6/uL (ref 3.77–5.28)
RDW: 14.2 % (ref 12.3–15.4)
WBC: 5.4 10*3/uL (ref 3.4–10.8)

## 2016-01-15 LAB — COMPREHENSIVE METABOLIC PANEL
ALBUMIN: 4.4 g/dL (ref 3.5–5.5)
ALK PHOS: 58 IU/L (ref 39–117)
ALT: 16 IU/L (ref 0–32)
AST: 13 IU/L (ref 0–40)
Albumin/Globulin Ratio: 1.8 (ref 1.2–2.2)
BILIRUBIN TOTAL: 0.3 mg/dL (ref 0.0–1.2)
BUN / CREAT RATIO: 15 (ref 9–23)
BUN: 12 mg/dL (ref 6–24)
CHLORIDE: 100 mmol/L (ref 96–106)
CO2: 25 mmol/L (ref 18–29)
Calcium: 9.6 mg/dL (ref 8.7–10.2)
Creatinine, Ser: 0.78 mg/dL (ref 0.57–1.00)
GFR calc Af Amer: 102 mL/min/{1.73_m2} (ref >59)
GFR calc non Af Amer: 88 mL/min/{1.73_m2} (ref >59)
GLOBULIN, TOTAL: 2.4 g/dL (ref 1.5–4.5)
Glucose: 94 mg/dL (ref 65–99)
Potassium: 3.5 mmol/L (ref 3.5–5.2)
SODIUM: 143 mmol/L (ref 134–144)
Total Protein: 6.8 g/dL (ref 6.0–8.5)

## 2016-01-15 LAB — BRAIN NATRIURETIC PEPTIDE: BNP: 87.7 pg/mL (ref 0.0–100.0)

## 2016-01-25 ENCOUNTER — Encounter: Payer: Self-pay | Admitting: *Deleted

## 2016-01-25 DIAGNOSIS — I10 Essential (primary) hypertension: Secondary | ICD-10-CM | POA: Insufficient documentation

## 2016-01-26 ENCOUNTER — Other Ambulatory Visit: Payer: Self-pay | Admitting: Family Medicine

## 2016-01-26 MED ORDER — TRAMADOL HCL 50 MG PO TABS: 50.0000 mg | ORAL_TABLET | Freq: Two times a day (BID) | ORAL | 0 refills | Status: DC | PRN

## 2016-01-28 ENCOUNTER — Other Ambulatory Visit: Payer: Self-pay

## 2016-01-29 ENCOUNTER — Ambulatory Visit: Admit: 2016-01-29 | Payer: BC Managed Care – PPO | Admitting: Gastroenterology

## 2016-01-29 SURGERY — COLONOSCOPY WITH PROPOFOL
Anesthesia: General

## 2016-02-04 ENCOUNTER — Emergency Department: Payer: BC Managed Care – PPO

## 2016-02-04 ENCOUNTER — Observation Stay
Admission: EM | Admit: 2016-02-04 | Discharge: 2016-02-05 | Disposition: A | Discharge status: Home / Self Care | Payer: BC Managed Care – PPO | Attending: Internal Medicine | Admitting: Internal Medicine

## 2016-02-04 ENCOUNTER — Encounter: Payer: Self-pay | Admitting: Emergency Medicine

## 2016-02-04 DIAGNOSIS — F17210 Nicotine dependence, cigarettes, uncomplicated: Secondary | ICD-10-CM | POA: Diagnosis not present

## 2016-02-04 DIAGNOSIS — I10 Essential (primary) hypertension: Secondary | ICD-10-CM | POA: Diagnosis not present

## 2016-02-04 DIAGNOSIS — Z79899 Other long term (current) drug therapy: Secondary | ICD-10-CM | POA: Diagnosis not present

## 2016-02-04 DIAGNOSIS — G4730 Sleep apnea, unspecified: Secondary | ICD-10-CM | POA: Diagnosis not present

## 2016-02-04 DIAGNOSIS — I776 Arteritis, unspecified: Secondary | ICD-10-CM | POA: Diagnosis not present

## 2016-02-04 DIAGNOSIS — Z8379 Family history of other diseases of the digestive system: Secondary | ICD-10-CM | POA: Insufficient documentation

## 2016-02-04 DIAGNOSIS — J342 Deviated nasal septum: Secondary | ICD-10-CM | POA: Insufficient documentation

## 2016-02-04 DIAGNOSIS — Z803 Family history of malignant neoplasm of breast: Secondary | ICD-10-CM | POA: Insufficient documentation

## 2016-02-04 DIAGNOSIS — R519 Headache, unspecified: Secondary | ICD-10-CM

## 2016-02-04 DIAGNOSIS — F329 Major depressive disorder, single episode, unspecified: Secondary | ICD-10-CM | POA: Diagnosis not present

## 2016-02-04 DIAGNOSIS — Z820 Family history of epilepsy and other diseases of the nervous system: Secondary | ICD-10-CM | POA: Insufficient documentation

## 2016-02-04 DIAGNOSIS — I2510 Atherosclerotic heart disease of native coronary artery without angina pectoris: Secondary | ICD-10-CM | POA: Insufficient documentation

## 2016-02-04 DIAGNOSIS — Z8052 Family history of malignant neoplasm of bladder: Secondary | ICD-10-CM | POA: Insufficient documentation

## 2016-02-04 DIAGNOSIS — Z8249 Family history of ischemic heart disease and other diseases of the circulatory system: Secondary | ICD-10-CM | POA: Insufficient documentation

## 2016-02-04 DIAGNOSIS — G43409 Hemiplegic migraine, not intractable, without status migrainosus: Secondary | ICD-10-CM | POA: Diagnosis not present

## 2016-02-04 DIAGNOSIS — Z885 Allergy status to narcotic agent status: Secondary | ICD-10-CM | POA: Diagnosis not present

## 2016-02-04 DIAGNOSIS — R51 Headache: Secondary | ICD-10-CM | POA: Diagnosis present

## 2016-02-04 DIAGNOSIS — Z87442 Personal history of urinary calculi: Secondary | ICD-10-CM | POA: Diagnosis not present

## 2016-02-04 DIAGNOSIS — G8191 Hemiplegia, unspecified affecting right dominant side: Secondary | ICD-10-CM | POA: Diagnosis not present

## 2016-02-04 DIAGNOSIS — K2270 Barrett's esophagus without dysplasia: Secondary | ICD-10-CM | POA: Diagnosis not present

## 2016-02-04 DIAGNOSIS — M5136 Other intervertebral disc degeneration, lumbar region: Secondary | ICD-10-CM | POA: Insufficient documentation

## 2016-02-04 DIAGNOSIS — M329 Systemic lupus erythematosus, unspecified: Secondary | ICD-10-CM | POA: Diagnosis not present

## 2016-02-04 DIAGNOSIS — Z882 Allergy status to sulfonamides status: Secondary | ICD-10-CM | POA: Diagnosis not present

## 2016-02-04 DIAGNOSIS — K589 Irritable bowel syndrome without diarrhea: Secondary | ICD-10-CM | POA: Insufficient documentation

## 2016-02-04 DIAGNOSIS — Z87440 Personal history of urinary (tract) infections: Secondary | ICD-10-CM | POA: Diagnosis not present

## 2016-02-04 DIAGNOSIS — R29810 Facial weakness: Principal | ICD-10-CM | POA: Diagnosis present

## 2016-02-04 DIAGNOSIS — IMO0002 Reserved for concepts with insufficient information to code with codable children: Secondary | ICD-10-CM | POA: Diagnosis present

## 2016-02-04 DIAGNOSIS — Z808 Family history of malignant neoplasm of other organs or systems: Secondary | ICD-10-CM | POA: Insufficient documentation

## 2016-02-04 DIAGNOSIS — Z91040 Latex allergy status: Secondary | ICD-10-CM | POA: Insufficient documentation

## 2016-02-04 DIAGNOSIS — Z7952 Long term (current) use of systemic steroids: Secondary | ICD-10-CM | POA: Diagnosis not present

## 2016-02-04 DIAGNOSIS — Z8042 Family history of malignant neoplasm of prostate: Secondary | ICD-10-CM | POA: Insufficient documentation

## 2016-02-04 DIAGNOSIS — Z886 Allergy status to analgesic agent status: Secondary | ICD-10-CM | POA: Insufficient documentation

## 2016-02-04 DIAGNOSIS — Z888 Allergy status to other drugs, medicaments and biological substances status: Secondary | ICD-10-CM | POA: Insufficient documentation

## 2016-02-04 DIAGNOSIS — Z823 Family history of stroke: Secondary | ICD-10-CM | POA: Insufficient documentation

## 2016-02-04 DIAGNOSIS — F419 Anxiety disorder, unspecified: Secondary | ICD-10-CM | POA: Insufficient documentation

## 2016-02-04 DIAGNOSIS — Z8673 Personal history of transient ischemic attack (TIA), and cerebral infarction without residual deficits: Secondary | ICD-10-CM | POA: Diagnosis not present

## 2016-02-04 DIAGNOSIS — K219 Gastro-esophageal reflux disease without esophagitis: Secondary | ICD-10-CM | POA: Diagnosis not present

## 2016-02-04 DIAGNOSIS — F32A Depression, unspecified: Secondary | ICD-10-CM | POA: Diagnosis present

## 2016-02-04 DIAGNOSIS — E785 Hyperlipidemia, unspecified: Secondary | ICD-10-CM | POA: Insufficient documentation

## 2016-02-04 DIAGNOSIS — Z881 Allergy status to other antibiotic agents status: Secondary | ICD-10-CM | POA: Insufficient documentation

## 2016-02-04 LAB — CBC
HCT: 40.8 % (ref 35.0–47.0)
HEMOGLOBIN: 14.3 g/dL (ref 12.0–16.0)
MCH: 31 pg (ref 26.0–34.0)
MCHC: 35 g/dL (ref 32.0–36.0)
MCV: 88.5 fL (ref 80.0–100.0)
Platelets: 196 10*3/uL (ref 150–440)
RBC: 4.61 MIL/uL (ref 3.80–5.20)
RDW: 13.8 % (ref 11.5–14.5)
WBC: 10.4 10*3/uL (ref 3.6–11.0)

## 2016-02-04 LAB — COMPREHENSIVE METABOLIC PANEL
ALT: 25 U/L (ref 14–54)
ANION GAP: 7 (ref 5–15)
AST: 23 U/L (ref 15–41)
Albumin: 3.9 g/dL (ref 3.5–5.0)
Alkaline Phosphatase: 52 U/L (ref 38–126)
BILIRUBIN TOTAL: 0.3 mg/dL (ref 0.3–1.2)
BUN: 17 mg/dL (ref 6–20)
CHLORIDE: 104 mmol/L (ref 101–111)
CO2: 27 mmol/L (ref 22–32)
Calcium: 8.4 mg/dL — ABNORMAL LOW (ref 8.9–10.3)
Creatinine, Ser: 0.76 mg/dL (ref 0.44–1.00)
GFR calc Af Amer: >60 mL/min (ref >60)
Glucose, Bld: 110 mg/dL — ABNORMAL HIGH (ref 65–99)
POTASSIUM: 3.4 mmol/L — AB (ref 3.5–5.1)
Sodium: 138 mmol/L (ref 135–145)
TOTAL PROTEIN: 6.9 g/dL (ref 6.5–8.1)

## 2016-02-04 LAB — URINALYSIS COMPLETE WITH MICROSCOPIC (ARMC ONLY)
BILIRUBIN URINE: NEGATIVE
GLUCOSE, UA: NEGATIVE mg/dL
HGB URINE DIPSTICK: NEGATIVE
KETONES UR: NEGATIVE mg/dL
LEUKOCYTES UA: NEGATIVE
NITRITE: NEGATIVE
PH: 7 (ref 5.0–8.0)
Protein, ur: NEGATIVE mg/dL
SPECIFIC GRAVITY, URINE: 1.008 (ref 1.005–1.030)

## 2016-02-04 LAB — DIFFERENTIAL
BASOS ABS: 0.1 10*3/uL (ref 0–0.1)
BASOS PCT: 1 %
EOS ABS: 0.1 10*3/uL (ref 0–0.7)
EOS PCT: 1 %
LYMPHS ABS: 2.7 10*3/uL (ref 1.0–3.6)
Lymphocytes Relative: 26 %
MONOS PCT: 7 %
Monocytes Absolute: 0.8 10*3/uL (ref 0.2–0.9)
NEUTROS PCT: 65 %
Neutro Abs: 6.8 10*3/uL — ABNORMAL HIGH (ref 1.4–6.5)

## 2016-02-04 LAB — GLUCOSE, CAPILLARY: Glucose-Capillary: 118 mg/dL — ABNORMAL HIGH (ref 65–99)

## 2016-02-04 LAB — APTT: APTT: 25 s (ref 24–36)

## 2016-02-04 LAB — PROTIME-INR
INR: 0.99
Prothrombin Time: 13.1 seconds (ref 11.4–15.2)

## 2016-02-04 LAB — ETHANOL

## 2016-02-04 LAB — TROPONIN I

## 2016-02-04 MED ORDER — MAGNESIUM SULFATE 2 GM/50ML IV SOLN
2.0000 g | Freq: Once | INTRAVENOUS | Status: AC
  Administered 2016-02-05: 01:00:00 2 g via INTRAVENOUS
  Filled 2016-02-04: qty 50

## 2016-02-04 MED ORDER — TRAZODONE HCL 100 MG PO TABS
100.0000 mg | ORAL_TABLET | Freq: Every evening | ORAL | Status: DC | PRN
  Administered 2016-02-05: 01:00:00 100 mg via ORAL
  Filled 2016-02-04: qty 1

## 2016-02-04 MED ORDER — STROKE: EARLY STAGES OF RECOVERY BOOK
Freq: Once | Status: AC
  Administered 2016-02-05: 01:00:00

## 2016-02-04 MED ORDER — CITALOPRAM HYDROBROMIDE 20 MG PO TABS
30.0000 mg | ORAL_TABLET | Freq: Every day | ORAL | Status: DC
  Administered 2016-02-05: 09:00:00 30 mg via ORAL
  Filled 2016-02-04: qty 2

## 2016-02-04 MED ORDER — PREDNISONE 20 MG PO TABS
20.0000 mg | ORAL_TABLET | Freq: Every day | ORAL | Status: DC
  Administered 2016-02-05: 20 mg via ORAL
  Filled 2016-02-04: qty 1

## 2016-02-04 MED ORDER — METOCLOPRAMIDE HCL 10 MG PO TABS
10.0000 mg | ORAL_TABLET | Freq: Three times a day (TID) | ORAL | Status: DC
  Administered 2016-02-05 (×2): 10 mg via ORAL
  Filled 2016-02-04 (×2): qty 1

## 2016-02-04 MED ORDER — KETOROLAC TROMETHAMINE 30 MG/ML IJ SOLN
30.0000 mg | Freq: Once | INTRAMUSCULAR | Status: AC
  Administered 2016-02-05: 30 mg via INTRAVENOUS
  Filled 2016-02-04: qty 1

## 2016-02-04 MED ORDER — SODIUM CHLORIDE 0.9 % IV SOLN
12.5000 mg | Freq: Once | INTRAVENOUS | Status: AC
  Administered 2016-02-05: 01:00:00 12.5 mg via INTRAVENOUS
  Filled 2016-02-04: qty 0.5

## 2016-02-04 MED ORDER — PANTOPRAZOLE SODIUM 40 MG PO TBEC
40.0000 mg | DELAYED_RELEASE_TABLET | Freq: Every day | ORAL | Status: DC
  Administered 2016-02-05: 09:00:00 40 mg via ORAL
  Filled 2016-02-04: qty 1

## 2016-02-04 MED ORDER — ENOXAPARIN SODIUM 40 MG/0.4ML ~~LOC~~ SOLN
40.0000 mg | Freq: Two times a day (BID) | SUBCUTANEOUS | Status: DC
  Administered 2016-02-05: 10:00:00 40 mg via SUBCUTANEOUS
  Filled 2016-02-04: qty 0.4

## 2016-02-04 MED ORDER — IOPAMIDOL (ISOVUE-370) INJECTION 76%
75.0000 mL | Freq: Once | INTRAVENOUS | Status: AC | PRN
  Administered 2016-02-04: 75 mL via INTRAVENOUS

## 2016-02-04 MED ORDER — TOPIRAMATE 25 MG PO TABS
50.0000 mg | ORAL_TABLET | Freq: Two times a day (BID) | ORAL | Status: DC
  Administered 2016-02-05 (×2): 50 mg via ORAL
  Filled 2016-02-04 (×3): qty 2

## 2016-02-04 MED ORDER — ASPIRIN 81 MG PO CHEW
324.0000 mg | CHEWABLE_TABLET | Freq: Once | ORAL | Status: AC
Start: 2016-02-04 — End: 2016-02-04
  Administered 2016-02-04: 324 mg via ORAL
  Filled 2016-02-04: qty 4

## 2016-02-04 NOTE — ED Triage Notes (Signed)
Right side facial droop. Right side grip weak onset 1430.  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

## 2016-02-04 NOTE — ED Notes (Signed)
Unable to get iv - soc on with pt. This nurse assessing the pt for the soc.

## 2016-02-04 NOTE — ED Provider Notes (Signed)
[facility] Emergency Department Provider Note   ____________________________________________   First MD Initiated Contact with Patient 02/04/16 1736     (approximate)  I have reviewed the triage vital signs and the nursing notes.   HISTORY  Chief Complaint No chief complaint on file.    HPI Shirley Rogers is a 50 y.o. female with history of hemiplegic migraines, CVA with residual right-sided deficits, lupus who presents for evaluation of left facial numbness and drooping of the left eyelid which began at 12:30 PM, gradual onset, constant, moderate, no modifying factors. She also noted a headache which she describes as "pressure" over the left eye. This was associated with some mild photophobia as well as nausea and vomiting. At approximately 2 PM, she was noted to have right lower facial droop was noted by her coworkers at a meeting. She reports that this has happened to her "many times" in the past however typically she has more of a headache then she has currently. She denies any new weakness in the arms or legs, no new changes in sensation in the extremities. No chest pain or difficulty breathing. Prior to today she had been in her usual state of health without illness.    Past Medical History:  Diagnosis Date  . Anxiety    on xanax in the past  . ASCVD (arteriosclerotic cardiovascular disease)   . Barrett esophagus   . Carotid atherosclerosis 2003   on ultrasound  . Colon polyp   . Complicated migraine    with facial drooping  . Concussion    1974,1982,1987,2016,2016  . DDD (degenerative disc disease), lumbar    MRI 2008, L3-4, L5-S1, spondylotic changes, multilevel facet joint hypertrophic changes  . Depression    on paxil, lexapro, cymbalta in the past  . Diverticulosis   . Dizziness due to old head injury   . Family history of adverse reaction to anesthesia    father - slow to wake  . Fatty liver 2013   On CT abdomen/pelvis  . Fibrocystic  breast disease   . GERD (gastroesophageal reflux disease)   . Hemiplegic migraine    daily  . Hemorrhoids   . Hyperlipidemia   . Hypertension    no meds currently.  . IBS (irritable bowel syndrome)   . Insomnia chronic  . Kidney stone 2013   on CT abdomen/pelvis  . 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 Migraine   . Narrowing of intervertebral disc space 2011   C5-6  . Normal cardiac stress test 2009   EF 66%  . Recurrent sinusitis   . Recurrent UTI    question about IC in the past  . Sleep apnea    CPAP machine broken  . Stroke ([place])    2000 - no deficits  . Umbilical hernia 0000000   on CT abdomen/pelvis  . Varicose veins    right lower leg  . Vascular spasm ([place])   . Vertigo    from concussive disorder.  seeing neuro 03/23/15    Patient Active Problem List   Diagnosis Date Noted  . Facial droop 02/04/2016  . Hypertension 01/25/2016  . Tobacco use 12/03/2015  . ANA positive 10/19/2015  . Transient alteration of awareness 09/24/2015  . Snoring 06/10/2015  . Sleep apnea 06/10/2015  . 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 Globus sensation 03/30/2015  .  Post concussion syndrome 03/13/2015  . Depression 03/13/2015  . Benign hypertensive renal disease   . Barrett esophagus   . Diverticulosis   . Hemiplegic migraine   . Lupus ([place])   . Stroke ([place])   . Fall 05/25/2014    Past Surgical History:  Procedure Laterality Date  . Biopsy Punch Thyroid    . BREAST EXCISIONAL BIOPSY Right 2000   right mass excision - atypical hyperplasia  . BREAST SURGERY Right    atypical hyperplasia  . CHOLECYSTECTOMY N/A 07/24/2015   Procedure: LAPAROSCOPIC CHOLECYSTECTOMY WITH INTRAOPERATIVE CHOLANGIOGRAM;  Surgeon: Leonie Green, MD;  Location: [facility] ORS;  Service: General;  Laterality: N/A;  . COLONOSCOPY W/ BIOPSIES     Removed 5 polyps  . TUBAL  LIGATION      Prior to Admission medications   Medication Sig Start Date End Date Taking? Authorizing Provider  albuterol (PROVENTIL HFA;VENTOLIN HFA) 108 (90 Base) MCG/ACT inhaler Inhale 2 puffs into the lungs every 4 (four) hours as needed for wheezing or shortness of breath.   Yes Historical Provider, MD  busPIRone (BUSPAR) 5 MG tablet Take 5-15 mg by mouth 3 (three) times daily as needed.   Yes Historical Provider, MD  citalopram (CELEXA) 20 MG tablet Take 30 mg by mouth daily.   Yes Historical Provider, MD  diphenhydrAMINE (BENADRYL) 25 mg capsule Take 2 capsules (50 mg total) by mouth every 6 (six) hours as needed. 12/14/15  Yes Carrie Mew, MD  EPINEPHrine (EPIPEN 2-PAK) 0.3 mg/0.3 mL IJ SOAJ injection Inject 0.3 mLs (0.3 mg total) into the muscle once. 09/24/15  Yes Megan P Johnson, DO  gabapentin (NEURONTIN) 300 MG capsule Take 900 mg by mouth at bedtime.  07/20/15 07/19/16 Yes Historical Provider, MD  hydrochlorothiazide (HYDRODIURIL) 25 MG tablet Take 1 tablet (25 mg total) by mouth daily. 11/03/15  Yes Megan P Johnson, DO  lidocaine (XYLOCAINE) 2 % solution  01/14/16  Yes Historical Provider, MD  magnesium oxide (MAG-OX) 400 MG tablet Take 400 mg by mouth daily. Reported on 05/14/2015   Yes Historical Provider, MD  metoCLOPramide (REGLAN) 10 MG tablet Take 1 tablet (10 mg total) by mouth 4 (four) times daily -  before meals and at bedtime. 12/14/15  Yes Carrie Mew, MD  omeprazole (PRILOSEC) 40 MG capsule Take 40 mg by mouth daily.   Yes Historical Provider, MD  predniSONE (DELTASONE) 20 MG tablet Take 1 tablet (20 mg total) by mouth daily with breakfast. 3 tabs x 1 wk, 2 tabs x 1 wk, 1 tab x 1 wk Patient taking differently: Take 20 mg by mouth daily. 3 tabs x 1 wk, 2 tabs x 1 wk, 1 tab x 1 wk Currently needs one tablet for this evening And then 1 tablet tomorrow evening, then course is complete 01/14/16  Yes Volney American, PA-C  topiramate (TOPAMAX) 50 MG tablet Take 1  tablet (50 mg total) by mouth 2 (two) times daily. 12/07/15  Yes Penni Bombard, MD  traMADol (ULTRAM) 50 MG tablet Take 1 tablet (50 mg total) by mouth 2 (two) times daily as needed. Patient taking differently: Take 100 mg by mouth at bedtime.  01/26/16  Yes Megan P Johnson, DO  traZODone (DESYREL) 100 MG tablet Take 100 mg by mouth at bedtime as needed for sleep.   Yes Historical Provider, MD  verapamil (CALAN-SR) 240 MG CR tablet Take 240 mg by mouth daily.  12/03/15  Yes Historical Provider, MD  EPINEPHrine 0.3 mg/0.3 mL IJ SOAJ injection  12/14/15   Historical Provider, MD    Allergies Rocephin [ceftriaxone]; Statins; Compazine [prochlorperazine]; Doxycycline; Erythromycin; Sulfa antibiotics; Meloxicam; Sulfamethoxazole-trimethoprim; Ace inhibitors; Biaxin [clarithromycin]; Latex; and Morphine and related  Family History  Problem Relation Age of Onset  . Other Mother     IBS  . Hypertension Mother   . Heart disease Father   . Diverticulosis Father   . Migraines Father   . Other Son     Ulcerative Colitis, Polonidal cyst  . Parkinson's disease Maternal Grandmother   . Cancer Maternal Grandmother   . Mental illness Paternal Grandmother   . Stroke Paternal Grandmother   . Cancer Paternal Grandmother     brain cancer  . Bipolar disorder Paternal Grandmother   . Stroke Maternal Grandfather   . Cancer Maternal Grandfather     Bladder, Prostate/bladder/prostate  . Heart disease Paternal Grandfather   . Heart attack Paternal Grandfather   . Macular degeneration    . Diabetes Brother   . Breast cancer Maternal Aunt     Social History Social History  Substance Use Topics  . Smoking status: Former Smoker    Packs/day: 0.25    Years: 1.00    Types: Cigarettes    Quit date: 06/16/2015  . Smokeless tobacco: Never Used     Comment: 1-2 cigarettes approx 3 x/wk  . Alcohol use 0.0 oz/week     Comment: social    Review of Systems Constitutional: No fever/chills Eyes: No visual  changes. ENT: No sore throat. Cardiovascular: Denies chest pain. Respiratory: Denies shortness of breath. Gastrointestinal: No abdominal pain.  No nausea, no vomiting.  No diarrhea.  No constipation. Genitourinary: Negative for dysuria. Musculoskeletal: Negative for back pain. Skin: Negative for rash. Neurological: + for headache, numbness and weakness  10-point ROS otherwise negative.  ____________________________________________   PHYSICAL EXAM:  Vitals:   02/04/16 1927 02/04/16 2000 02/04/16 2030 02/04/16 2100  BP:  (!) 146/73 131/64 123/70  Pulse: 81 82 78 78  Resp: 18 (!) 34 20 16  Temp:      SpO2: 95% 99% 96% 97%  Weight:      Height:        VITAL SIGNS: ED Triage Vitals  Enc Vitals Group     BP 02/04/16 1723 (!) 181/107     Pulse Rate 02/04/16 1723 80     Resp 02/04/16 1723 20     Temp 02/04/16 1723 98 F (36.7 C)     Temp src --      SpO2 02/04/16 1723 99 %     Weight 02/04/16 1724 241 lb (109.3 kg)     Height 02/04/16 1724 5\' 4"  (1.626 m)     Head Circumference --      Peak Flow --      Pain Score --      Pain Loc --      Pain Edu? --      Excl. in [place]? --     Constitutional: Alert and oriented. Well appearing and in no acute distress. Eyes: Conjunctivae are normal. PERRL. EOMI. Head: Atraumatic. Nose: No congestion/rhinnorhea. Mouth/Throat: Mucous membranes are moist.  Oropharynx non-erythematous. Neck: No stridor. Supple without meningismus. Cardiovascular: Normal rate, regular rhythm. Grossly normal heart sounds.  Good peripheral circulation. Respiratory: Normal respiratory effort.  No retractions. Lungs CTAB. Gastrointestinal: Soft and nontender. No distention. No CVA tenderness. Genitourinary: deferred Musculoskeletal: No lower extremity tenderness nor edema.  No joint effusions. Neurologic:  Ptosis of the left eyelids. Mild right lower facial  droop. 4+ out of 5 strength in the right upper extremity, 5 out of 5 strength in the remaining  extremities. Decreased sensation to light touch in the right upper and the right lower extremity, normal sensation in the left upper and left lower extremity. Mildly decreased sensation in the left face. Skin:  Skin is warm, dry and intact. No rash noted. Psychiatric: Mood and affect are normal. Speech and behavior are normal.  ____________________________________________   LABS (all labs ordered are listed, but only abnormal results are displayed)  Labs Reviewed  DIFFERENTIAL - Abnormal; Notable for the following:       Result Value   Neutro Abs 6.8 (*)    All other components within normal limits  COMPREHENSIVE METABOLIC PANEL - Abnormal; Notable for the following:    Potassium 3.4 (*)    Glucose, Bld 110 (*)    Calcium 8.4 (*)    All other components within normal limits  URINALYSIS COMPLETEWITH MICROSCOPIC ([facility] ONLY) - Abnormal; Notable for the following:    Color, Urine STRAW (*)    APPearance CLEAR (*)    Bacteria, UA RARE (*)    Squamous Epithelial / LPF 0-5 (*)    All other components within normal limits  GLUCOSE, CAPILLARY - Abnormal; Notable for the following:    Glucose-Capillary 118 (*)    All other components within normal limits  ETHANOL  PROTIME-INR  APTT  CBC  TROPONIN I   ____________________________________________  EKG  ED ECG REPORT I, Joanne Gavel, the attending physician, personally viewed and interpreted this ECG.   Date: 02/04/2016  EKG Time: 17:52  Rate: 85  Rhythm: normal EKG, normal sinus rhythm  Axis: normal  Intervals:none  ST&T Change: No acute ST elevation or acute ST depression.  ____________________________________________  RADIOLOGY  CT head IMPRESSION:  No acute intracranial abnormalities.    Small old lacunar infarct at RIGHT basal ganglia.    Findings called to Dr. Jimmye Norman on 02/04/2016 at 1752 hr.     CTA brain and neck IMPRESSION:  1. Negative CTA of the head and neck. No large vessel occlusion. No    high-grade or correctable stenosis.  2. Mild atheromatous plaque about the carotid bifurcations  bilaterally without significant stenosis.       ____________________________________________   PROCEDURES  Procedure(s) performed: None  Procedures  Critical Care performed: No  ____________________________________________   INITIAL IMPRESSION / ASSESSMENT AND PLAN / ED COURSE  Pertinent labs & imaging results that were available during my care of the patient were reviewed by me and considered in my medical decision making (see chart for details).  Shirley Rogers is a 50 y.o. female with history of hemiplegic migraines, CVA with residual right-sided deficits, lupus who presents for evaluation of left facial numbness and drooping of the left eyelid which began at 12:30 PM as well as facial droop which was first noted at 2 PM. On exam she is generally well-appearing and in no acute distress. Vital signs stable and she is afebrile. Code stroke initiated on arrival, will obtain screening labs, CT head, consult telemetry neurologist on-call. NIH stroke scale 4.   ----------------------------------------- 6:34 PM on 02/04/2016 ----------------------------------------- Several nurses attempted multiple times to draw blood/start IV without success. I was notified at 6:23 pm of inability to obtain IV access by the teleneurology [facility] interviewing the patient when I re-entered the room to discuss the patient's case. I was under the impression that labs had been drawn and access obtained as I had  not been told otherwise by patient's nurse. I immediately called the charge nurse for additional support. Both I and charge nurse tried concurrently to obtain access in either arm and charge nurse was successful at placing US guided IV quickly in the right arm. Attempt was successful however labs still pending at this time and without basic labs or coags cannot r/o bleeding diathesis which would be an  absolute contraindication for tPa. However, on careful history taking, the patient's symptoms initially started at 12:30 PM therefore she has been outside of the window for TPA since her arrival to the ER, and this was also noted by the  teleneurologist Dr. Shellia Cleverly who evaluated her, please see his dictated consult note for details. CT Head is negative for any acute intracranial process. Labs are generally unremarkable. Dr. Shellia Cleverly recommends CTA brain and neck, if negative, recommends admission for full stroke workup given concern for acute CVA vs hemiplegic migraine.  ----------------------------------------- 8:42 PM on 02/04/2016 ----------------------------------------- CTA brain and neck are negative for acute occlusion. Aspirin ordered. Case discussed with hospitalist for admission at this time.   Clinical Course     ____________________________________________   FINAL CLINICAL IMPRESSION(S) / ED DIAGNOSES  Final diagnoses:  Acute nonintractable headache, unspecified headache type  Facial droop      NEW MEDICATIONS STARTED DURING THIS VISIT:  New Prescriptions   No medications on file     Note:  This document was prepared using Dragon voice recognition software and may include unintentional dictation errors.    Joanne Gavel, MD 02/04/16 2131

## 2016-02-04 NOTE — ED Notes (Signed)
Pt will be coming from ct with facial numbness

## 2016-02-04 NOTE — ED Notes (Signed)
Pt to ct 

## 2016-02-04 NOTE — ED Notes (Signed)
Pt c/o our not being able to get an iv in until the charge nurse came in the get the iv with an [place]. Charge nurse speaking with pt.

## 2016-02-04 NOTE — H&P (Signed)
[facility] NAME: Shirley Rogers    MR#:  [id number]  DATE OF BIRTH:  05-09-1966  DATE OF ADMISSION:  02/04/2016  PRIMARY CARE PHYSICIAN: Park Liter, DO   REQUESTING/REFERRING PHYSICIAN: Edd Fabian, MD  CHIEF COMPLAINT:  No chief complaint on file.   HISTORY OF PRESENT ILLNESS:  Shirley Rogers  is a 50 y.o. female who presents with Neurological symptoms since about 2:00 this afternoon. Patient has a history of hemiplegic migraine, but also has a history of a prior stroke. She states that her normal hemiplegic migraine presents as left upper and right lower facial weakness with right upper extremity weakness. She states that this can last sometimes anywhere from a few hours to several weeks. Previously she was being treated at [facility] clinic with a number of "infusions" which help control her symptoms and reduced her frequency of these events to about one per year. She was seen tonight in the ED and states that she has a new component, which is numbness in her face and right extremities. Telemetry neurologist saw her and recommended admission for rule out stroke. Hospitals were called for admission.  PAST MEDICAL HISTORY:   Past Medical History:  Diagnosis Date  . Anxiety    on xanax in the past  . ASCVD (arteriosclerotic cardiovascular disease)   . Barrett esophagus   . Carotid atherosclerosis 2003   on ultrasound  . Colon polyp   . Complicated migraine    with facial drooping  . Concussion    1974,1982,1987,2016,2016  . DDD (degenerative disc disease), lumbar    MRI 2008, L3-4, L5-S1, spondylotic changes, multilevel facet joint hypertrophic changes  . Depression    on paxil, lexapro, cymbalta in the past  . Diverticulosis   . Dizziness due to old head injury   . Family history of adverse reaction to anesthesia    father - slow to wake  . Fatty liver 2013   On CT abdomen/pelvis  . Fibrocystic breast disease   . GERD  (gastroesophageal reflux disease)   . Hemiplegic migraine    daily  . Hemorrhoids   . Hyperlipidemia   . Hypertension    no meds currently.  . IBS (irritable bowel syndrome)   . Insomnia chronic  . Kidney stone 2013   on CT abdomen/pelvis  . 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 Migraine   . Narrowing of intervertebral disc space 2011   C5-6  . Normal cardiac stress test 2009   EF 66%  . Recurrent sinusitis   . Recurrent UTI    question about IC in the past  . Sleep apnea    CPAP machine broken  . Stroke ([place])    2000 - no deficits  . Umbilical hernia 0000000   on CT abdomen/pelvis  . Varicose veins    right lower leg  . Vascular spasm ([place])   . Vertigo    from concussive disorder.  seeing neuro 03/23/15    PAST SURGICAL HISTORY:   Past Surgical History:  Procedure Laterality Date  . Biopsy Punch Thyroid    . BREAST EXCISIONAL BIOPSY Right 2000   right mass excision - atypical hyperplasia  . BREAST SURGERY Right    atypical hyperplasia  . CHOLECYSTECTOMY N/A 07/24/2015   Procedure: LAPAROSCOPIC CHOLECYSTECTOMY WITH INTRAOPERATIVE CHOLANGIOGRAM;  Surgeon: Leonie Green, MD;  Location: [facility] ORS;  Service: General;  Laterality: N/A;  . COLONOSCOPY W/ BIOPSIES     Removed 5 polyps  . TUBAL LIGATION      SOCIAL HISTORY:   Social History  Substance Use Topics  . Smoking status: Former Smoker    Packs/day: 0.25    Years: 1.00    Types: Cigarettes    Quit date: 06/16/2015  . Smokeless tobacco: Never Used     Comment: 1-2 cigarettes approx 3 x/wk  . Alcohol use 0.0 oz/week     Comment: social    FAMILY HISTORY:   Family History  Problem Relation Age of Onset  . Other Mother     IBS  . Hypertension Mother   . Heart disease Father   . Diverticulosis Father   . Migraines Father   . Other Son     Ulcerative Colitis, Polonidal cyst  . Parkinson's disease Maternal Grandmother   .  Cancer Maternal Grandmother   . Mental illness Paternal Grandmother   . Stroke Paternal Grandmother   . Cancer Paternal Grandmother     brain cancer  . Bipolar disorder Paternal Grandmother   . Stroke Maternal Grandfather   . Cancer Maternal Grandfather     Bladder, Prostate/bladder/prostate  . Heart disease Paternal Grandfather   . Heart attack Paternal Grandfather   . Macular degeneration    . Diabetes Brother   . Breast cancer Maternal Aunt     DRUG ALLERGIES:   Allergies  Allergen Reactions  . Rocephin [Ceftriaxone] Anaphylaxis  . Statins Anaphylaxis, Diarrhea and Nausea And Vomiting  . Compazine [Prochlorperazine] Other (See Comments)    akathesia akathesia  . Doxycycline Nausea And Vomiting  . Erythromycin Itching, Nausea And Vomiting and Rash    Patient states she is allergic to all mycin drugs  . Sulfa Antibiotics Diarrhea and Nausea And Vomiting  . Meloxicam   . Sulfamethoxazole-Trimethoprim Other (See Comments)  . Ace Inhibitors Itching  . Biaxin [Clarithromycin] Itching, Nausea And Vomiting and Rash  . Latex Rash  . Morphine And Related Nausea And Vomiting    MEDICATIONS AT HOME:   Prior to Admission medications   Medication Sig Start Date End Date Taking? Authorizing Provider  albuterol (PROVENTIL HFA;VENTOLIN HFA) 108 (90 Base) MCG/ACT inhaler Inhale 2 puffs into the lungs every 4 (four) hours as needed for wheezing or shortness of breath.   Yes Historical Provider, MD  busPIRone (BUSPAR) 5 MG tablet Take 5-15 mg by mouth 3 (three) times daily as needed.   Yes Historical Provider, MD  citalopram (CELEXA) 20 MG tablet Take 30 mg by mouth daily.   Yes Historical Provider, MD  diphenhydrAMINE (BENADRYL) 25 mg capsule Take 2 capsules (50 mg total) by mouth every 6 (six) hours as needed. 12/14/15  Yes Carrie Mew, MD  EPINEPHrine (EPIPEN 2-PAK) 0.3 mg/0.3 mL IJ SOAJ injection Inject 0.3 mLs (0.3 mg total) into the muscle once. 09/24/15  Yes Megan P Johnson, DO   gabapentin (NEURONTIN) 300 MG capsule Take 900 mg by mouth at bedtime.  07/20/15 07/19/16 Yes Historical Provider, MD  hydrochlorothiazide (HYDRODIURIL) 25 MG tablet Take 1 tablet (25 mg total) by mouth daily. 11/03/15  Yes Megan P Johnson, DO  lidocaine (XYLOCAINE) 2 % solution  01/14/16  Yes Historical Provider, MD  magnesium oxide (MAG-OX) 400 MG tablet Take 400 mg by mouth daily. Reported on 05/14/2015   Yes Historical Provider, MD  metoCLOPramide (REGLAN) 10 MG tablet Take 1 tablet (10 mg total) by mouth 4 (four) times daily -  before meals  and at bedtime. 12/14/15  Yes Carrie Mew, MD  omeprazole (PRILOSEC) 40 MG capsule Take 40 mg by mouth daily.   Yes Historical Provider, MD  predniSONE (DELTASONE) 20 MG tablet Take 1 tablet (20 mg total) by mouth daily with breakfast. 3 tabs x 1 wk, 2 tabs x 1 wk, 1 tab x 1 wk Patient taking differently: Take 20 mg by mouth daily. 3 tabs x 1 wk, 2 tabs x 1 wk, 1 tab x 1 wk Currently needs one tablet for this evening And then 1 tablet tomorrow evening, then course is complete 01/14/16  Yes Volney American, PA-C  topiramate (TOPAMAX) 50 MG tablet Take 1 tablet (50 mg total) by mouth 2 (two) times daily. 12/07/15  Yes Penni Bombard, MD  traMADol (ULTRAM) 50 MG tablet Take 1 tablet (50 mg total) by mouth 2 (two) times daily as needed. Patient taking differently: Take 100 mg by mouth at bedtime.  01/26/16  Yes Megan P Johnson, DO  traZODone (DESYREL) 100 MG tablet Take 100 mg by mouth at bedtime as needed for sleep.   Yes Historical Provider, MD  verapamil (CALAN-SR) 240 MG CR tablet Take 240 mg by mouth daily.  12/03/15  Yes Historical Provider, MD  EPINEPHrine 0.3 mg/0.3 mL IJ SOAJ injection  12/14/15   Historical Provider, MD    REVIEW OF SYSTEMS:  Review of Systems  Constitutional: Positive for malaise/fatigue. Negative for chills, fever and weight loss.  HENT: Negative for ear pain, hearing loss and tinnitus.   Eyes: Negative for blurred vision,  double vision, pain and redness.  Respiratory: Negative for cough, hemoptysis and shortness of breath.   Cardiovascular: Negative for chest pain, palpitations, orthopnea and leg swelling.  Gastrointestinal: Negative for abdominal pain, constipation, diarrhea, nausea and vomiting.  Genitourinary: Negative for dysuria, frequency and hematuria.  Musculoskeletal: Negative for back pain, joint pain and neck pain.  Skin:       No acne, rash, or lesions  Neurological: Positive for sensory change, focal weakness and weakness. Negative for dizziness and tremors.  Endo/Heme/Allergies: Negative for polydipsia. Does not bruise/bleed easily.  Psychiatric/Behavioral: Negative for depression. The patient is not nervous/anxious and does not have insomnia.      VITAL SIGNS:   Vitals:   02/04/16 1927 02/04/16 2000 02/04/16 2030 02/04/16 2100  BP:  (!) 146/73 131/64 123/70  Pulse: 81 82 78 78  Resp: 18 (!) 34 20 16  Temp:      SpO2: 95% 99% 96% 97%  Weight:      Height:       Wt Readings from Last 3 Encounters:  02/04/16 109.3 kg (241 lb)  01/14/16 116.1 kg (256 lb)  12/14/15 112 kg (247 lb)    PHYSICAL EXAMINATION:  Physical Exam  Vitals reviewed. Constitutional: She is oriented to person, place, and time. She appears well-developed and well-nourished. No distress.  HENT:  Head: Normocephalic and atraumatic.  Mouth/Throat: Oropharynx is clear and moist.  Eyes: Conjunctivae and EOM are normal. Pupils are equal, round, and reactive to light. No scleral icterus.  Neck: Normal range of motion. Neck supple. No JVD present. No thyromegaly present.  Cardiovascular: Normal rate, regular rhythm and intact distal pulses.  Exam reveals no gallop and no friction rub.   No murmur heard. Respiratory: Effort normal and breath sounds normal. No respiratory distress. She has no wheezes. She has no rales.  GI: Soft. Bowel sounds are normal. She exhibits no distension. There is no tenderness.   Musculoskeletal:  Normal range of motion. She exhibits no edema.  No arthritis, no gout  Lymphadenopathy:    She has no cervical adenopathy.  Neurological: She is alert and oriented to person, place, and time. A cranial nerve deficit is present.  Neurologic: Cranial nerves II-XII intact except for left upper and right lower facial droop as well as left hemisensory deficit presenting as numbness to light touch, 5/5 strength in left upper and lower extremities, 4/5 strength in right upper and lower extremities with right upper greater than right lower sensory numbness to light touch, no dysarthria, no aphasia, no dysphagia, memory intact, no pronator drift, Babinski sign not present.   Skin: Skin is warm and dry. No rash noted. No erythema.  Psychiatric: She has a normal mood and affect. Her behavior is normal. Judgment and thought content normal.    LABORATORY PANEL:   CBC  Recent Labs Lab 02/04/16 1833  WBC 10.4  HGB 14.3  HCT 40.8  PLT 196   ------------------------------------------------------------------------------------------------------------------  Chemistries   Recent Labs Lab 02/04/16 1833  NA 138  K 3.4*  CL 104  CO2 27  GLUCOSE 110*  BUN 17  CREATININE 0.76  CALCIUM 8.4*  AST 23  ALT 25  ALKPHOS 52  BILITOT 0.3   ------------------------------------------------------------------------------------------------------------------  Cardiac Enzymes  Recent Labs Lab 02/04/16 1833  TROPONINI <0.03   ------------------------------------------------------------------------------------------------------------------  RADIOLOGY:  Ct Angio Head W Or Wo Contrast  Result Date: 02/04/2016 CLINICAL DATA:  Initial valuation for acute left facial droop. EXAM: CT ANGIOGRAPHY HEAD AND NECK TECHNIQUE: Multidetector CT imaging of the head and neck was performed using the standard protocol during bolus administration of intravenous contrast. Multiplanar CT image  reconstructions and MIPs were obtained to evaluate the vascular anatomy. Carotid stenosis measurements (when applicable) are obtained utilizing NASCET criteria, using the distal internal carotid diameter as the denominator. COMPARISON:  Prior noncontrast head CT from earlier the same day. FINDINGS: CTA NECK Aortic arch: Visualized aortic arch of normal caliber with normal 3 vessel morphology. No high-grade stenosis seen at the origin of the great vessels. Visualized subclavian arteries widely patent. Right carotid system: Right common carotid artery patent from its origin to the bifurcation. Mild a centric calcified plaque about the right bifurcation without significant stenosis. Right ICA patent from the bifurcation to the skullbase without stenosis, dissection, or occlusion. Left carotid system: Left common carotid artery patent from its origin to the bifurcation. Mild eccentric calcified plaque about the left bifurcation without high-grade stenosis. Left ICA patent distally without stenosis, dissection, or occlusion. Vertebral arteries:Both vertebral arteries arise from the subclavian arteries. Vertebral arteries widely patent within the neck without stenosis, dissection, or occlusion. Skeleton: No acute osseous abnormality. No worrisome lytic or blastic osseous lesions. Other neck: Visualized lungs are clear. Visualize mediastinum within normal limits. Thyroid gland normal. No adenopathy within neck. No acute soft tissue abnormality. The CTA HEAD Anterior circulation: Petrous, cavernous, and supraclinoid segments are widely patent without flow-limiting stenosis. A1 segments patent. Anterior communicating artery normal. Anterior cerebral arteries patent to their distal aspects. M1 segments patent without stenosis or occlusion. MCA bifurcations normal. Distal MCA branches well opacified and symmetric. Posterior circulation: Vertebral arteries patent to the vertebrobasilar junction. Posterior inferior cerebral  arteries patent. Basilar artery mildly tortuous but well opacified to its distal aspect. Superior cerebellar is patent bilaterally. Left PCA supplied via the basilar artery as well as a small left posterior communicating artery. Left PCA well supplied to its distal aspect. Fetal type right PCA supplied via  a right P com. Right PCA also patent to its distal aspect. Venous sinuses: Patent without evidence for venous sinus thrombosis. Anatomic variants: Fetal type right PCA. No aneurysm or vascular malformation. Delayed phase: No pathologic enhancement. IMPRESSION: 1. Negative CTA of the head and neck. No large vessel occlusion. No high-grade or correctable stenosis. 2. Mild atheromatous plaque about the carotid bifurcations bilaterally without significant stenosis. Electronically Signed   By: Jeannine Boga M.D.   On: 02/04/2016 20:28   Ct Head Wo Contrast  Result Date: 02/04/2016 CLINICAL DATA:  Code stroke, LEFT facial droop, history of hemiplegia migraines with RIGHT facial drooping, generalized RIGHT side weakness, today LEFT side facial drooping normally, history of lupus, hypertension, stroke, former smoker EXAM: CT HEAD WITHOUT CONTRAST TECHNIQUE: Contiguous axial images were obtained from the base of the skull through the vertex without intravenous contrast. COMPARISON:  12/26/2014 FINDINGS: Normal ventricular morphology. No midline shift or mass effect. Small old lacunar infarct at the RIGHT basal ganglia posteriorly. Lucency at the inferior LEFT basal ganglia question cyst or prominent perivascular space unchanged. No intracranial hemorrhage, mass lesion, evidence of acute infarction or extra-axial fluid collections. Nasal septal deviation to the RIGHT. Bones and sinuses otherwise unremarkable. IMPRESSION: No acute intracranial abnormalities. Small old lacunar infarct at RIGHT basal ganglia. Findings called to Dr. Jimmye Norman on 02/04/2016 at 1752 hr. Electronically Signed   By: Lavonia Dana M.D.   On:  02/04/2016 17:52   Ct Angio Neck W And/or Wo Contrast  Result Date: 02/04/2016 CLINICAL DATA:  Initial valuation for acute left facial droop. EXAM: CT ANGIOGRAPHY HEAD AND NECK TECHNIQUE: Multidetector CT imaging of the head and neck was performed using the standard protocol during bolus administration of intravenous contrast. Multiplanar CT image reconstructions and MIPs were obtained to evaluate the vascular anatomy. Carotid stenosis measurements (when applicable) are obtained utilizing NASCET criteria, using the distal internal carotid diameter as the denominator. COMPARISON:  Prior noncontrast head CT from earlier the same day. FINDINGS: CTA NECK Aortic arch: Visualized aortic arch of normal caliber with normal 3 vessel morphology. No high-grade stenosis seen at the origin of the great vessels. Visualized subclavian arteries widely patent. Right carotid system: Right common carotid artery patent from its origin to the bifurcation. Mild a centric calcified plaque about the right bifurcation without significant stenosis. Right ICA patent from the bifurcation to the skullbase without stenosis, dissection, or occlusion. Left carotid system: Left common carotid artery patent from its origin to the bifurcation. Mild eccentric calcified plaque about the left bifurcation without high-grade stenosis. Left ICA patent distally without stenosis, dissection, or occlusion. Vertebral arteries:Both vertebral arteries arise from the subclavian arteries. Vertebral arteries widely patent within the neck without stenosis, dissection, or occlusion. Skeleton: No acute osseous abnormality. No worrisome lytic or blastic osseous lesions. Other neck: Visualized lungs are clear. Visualize mediastinum within normal limits. Thyroid gland normal. No adenopathy within neck. No acute soft tissue abnormality. The CTA HEAD Anterior circulation: Petrous, cavernous, and supraclinoid segments are widely patent without flow-limiting stenosis. A1  segments patent. Anterior communicating artery normal. Anterior cerebral arteries patent to their distal aspects. M1 segments patent without stenosis or occlusion. MCA bifurcations normal. Distal MCA branches well opacified and symmetric. Posterior circulation: Vertebral arteries patent to the vertebrobasilar junction. Posterior inferior cerebral arteries patent. Basilar artery mildly tortuous but well opacified to its distal aspect. Superior cerebellar is patent bilaterally. Left PCA supplied via the basilar artery as well as a small left posterior communicating artery. Left PCA well supplied  to its distal aspect. Fetal type right PCA supplied via a right P com. Right PCA also patent to its distal aspect. Venous sinuses: Patent without evidence for venous sinus thrombosis. Anatomic variants: Fetal type right PCA. No aneurysm or vascular malformation. Delayed phase: No pathologic enhancement. IMPRESSION: 1. Negative CTA of the head and neck. No large vessel occlusion. No high-grade or correctable stenosis. 2. Mild atheromatous plaque about the carotid bifurcations bilaterally without significant stenosis. Electronically Signed   By: Jeannine Boga M.D.   On: 02/04/2016 20:28    EKG:   Orders placed or performed during the hospital encounter of 02/04/16  . ED EKG  . ED EKG  . EKG 12-Lead  . EKG 12-Lead    IMPRESSION AND PLAN:  Principal Problem:   Right hemiparesis ([place]) - Will admit for stroke workup. Patient had a CTA done in the ED which showed an old stroke and mild bilateral carotid nonstenotic plaques, but is otherwise largely within normal limits.  We've ordered MRI, and other appropriate imaging along with neurology consult. Active Problems:   Hemiplegic migraine - patient states that she often will receive IV Toradol, Phenergan, and magnesium at [facility], among other "infusions" which would help her migraines. We will order these tonight to see if she has any improvement in her  symptoms.   Lupus ([place]) - continue prednisone taper per home meds   Hypertension - continue home meds   Barrett esophagus - home dose PPI   Depression - tinea home meds    All the records are reviewed and case discussed with ED provider. Management plans discussed with the patient and/or family.  DVT PROPHYLAXIS: SubQ lovenox  GI PROPHYLAXIS: PPI  ADMISSION STATUS: Observation  CODE STATUS: Full Code Status History    This patient does not have a recorded code status. Please follow your organizational policy for patients in this situation.      TOTAL TIME TAKING CARE OF THIS PATIENT: 50 minutes.    Jamirah Zelaya [place] 02/04/2016, 9:48 PM  [facility]  Office  [phone]  CC: Primary care physician; Park Liter, DO

## 2016-02-04 NOTE — ED Notes (Addendum)
Pt back from ct. Symptoms started at 1230 with pressure in her left face and numbness. Pt continued working. Had an episode of nausea and then at 1430 had an episode of diarrhea and vomited once and noticed her left eye drooping. The right side weakness and numbness is from her previous stroke and are normal for her with no change.

## 2016-02-05 ENCOUNTER — Observation Stay
Admit: 2016-02-05 | Discharge: 2016-02-05 | Disposition: A | Discharge status: Home / Self Care | Payer: BC Managed Care – PPO | Attending: Internal Medicine | Admitting: Internal Medicine

## 2016-02-05 ENCOUNTER — Observation Stay: Payer: BC Managed Care – PPO

## 2016-02-05 ENCOUNTER — Encounter: Payer: Self-pay | Admitting: *Deleted

## 2016-02-05 DIAGNOSIS — G43409 Hemiplegic migraine, not intractable, without status migrainosus: Secondary | ICD-10-CM

## 2016-02-05 LAB — HEMOGLOBIN A1C: HEMOGLOBIN A1C: 5.7 % (ref 4.0–6.0)

## 2016-02-05 LAB — LIPID PANEL
CHOL/HDL RATIO: 4.8 ratio
CHOLESTEROL: 260 mg/dL — AB (ref 0–200)
HDL: 54 mg/dL (ref >40)
LDL Cholesterol: 178 mg/dL — ABNORMAL HIGH (ref 0–99)
Triglycerides: 139 mg/dL (ref <150)
VLDL: 28 mg/dL (ref 0–40)

## 2016-02-05 MED ORDER — THIAMINE HCL 100 MG/ML IJ SOLN
100.0000 mg | Freq: Every day | INTRAMUSCULAR | Status: DC
  Administered 2016-02-05: 100 mg via INTRAVENOUS
  Filled 2016-02-05: qty 2

## 2016-02-05 MED ORDER — DIPHENHYDRAMINE HCL 50 MG/ML IJ SOLN
25.0000 mg | Freq: Once | INTRAMUSCULAR | Status: AC
Start: 2016-02-05 — End: 2016-02-05
  Administered 2016-02-05: 13:00:00 25 mg via INTRAVENOUS
  Filled 2016-02-05: qty 1

## 2016-02-05 MED ORDER — POTASSIUM CHLORIDE CRYS ER 20 MEQ PO TBCR
40.0000 meq | EXTENDED_RELEASE_TABLET | Freq: Once | ORAL | Status: AC
  Administered 2016-02-05: 40 meq via ORAL
  Filled 2016-02-05: qty 2

## 2016-02-05 MED ORDER — BUSPIRONE HCL 5 MG PO TABS
5.0000 mg | ORAL_TABLET | Freq: Three times a day (TID) | ORAL | Status: DC | PRN
  Administered 2016-02-05: 15 mg via ORAL
  Filled 2016-02-05: qty 3

## 2016-02-05 MED ORDER — GABAPENTIN 300 MG PO CAPS
900.0000 mg | ORAL_CAPSULE | Freq: Every day | ORAL | Status: DC
  Administered 2016-02-05: 01:00:00 900 mg via ORAL
  Filled 2016-02-05: qty 3

## 2016-02-05 MED ORDER — VALPROATE SODIUM 500 MG/5ML IV SOLN
500.0000 mg | Freq: Once | INTRAVENOUS | Status: AC
  Administered 2016-02-05: 13:00:00 500 mg via INTRAVENOUS
  Filled 2016-02-05: qty 5

## 2016-02-05 NOTE — Evaluation (Signed)
SLP Cancellation Note  Patient Details Name: CARRINA ZINMAN MRN: [id number] DOB: 1965/06/17   Cancelled treatment:       Reason Eval/Treat Not Completed: SLP screened, no needs identified, will sign off (pt denied any speech or swallowing deficits; reg diet). Pt identified that she "at times" has "word finding issues" but related that to having "post-concussion syndrome 9 weeks ago". She verbalized strategies of slowing down and taking her time. Pt had consumed full breakfast meal just now. She verbalized s/s of reflux and her strategies to address this when she is eating. Pt does have a baseline dx of GERD.  Recommended f/u w/ GI as needed.    Watson,Katherine 02/05/2016, 10:18 AM

## 2016-02-05 NOTE — Progress Notes (Signed)
Pt has been discharged home. Discharge papers given and explained to pt. Pt verbalized understanding. F/U appointments and meds reviewed with pt. Pt escorted in a wheelchair.

## 2016-02-05 NOTE — Evaluation (Signed)
Physical Therapy Evaluation Patient Details Name: Shirley Rogers MRN: [id number] DOB: 01/12/1966 Today's Date: 02/05/2016   History of Present Illness  presented to ER secondary to acute onset of facial numbness, eyelid drooping, nausea/vomiting; admitted for TIA/CVA work up.  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  Clinical Impression  Upon evaluation, patient alert and oriented; follows all commands and demonstrates good insight/safety awareness.  R UE/LE testing reveals mild strength deficit (though somewhat inconsistent with isolated testing vs functional ability); no sensory, coordination deficits appreciated.  Able to complete bed mobility indep; sit/stand, basic transfers and gait (400') without assist device, sup/mod indep. Mild sway/gait deviations with lateral head turns and changes of gait speed (modified DGI 10/12), but patient able to self-correct without assist from therapist. Would benefit from skilled PT to address above deficits and promote optimal return to PLOF; recommend transition to home without patient vestibular PT follow up as appropriate.    Follow Up Recommendations Outpatient PT (vestibular PT)    Equipment Recommendations       Recommendations for Other Services       Precautions / Restrictions Precautions Precautions: Fall Restrictions Weight Bearing Restrictions: No      Mobility  Bed Mobility Overal bed mobility: Independent                Transfers Overall transfer level: Modified independent Equipment used: None Transfers: Sit to/from Stand Sit to Stand: Supervision            Ambulation/Gait Ambulation/Gait assistance: Supervision Ambulation Distance (Feet): 400  Feet Assistive device: None       General Gait Details: reciprocal stepping pattern with symmetrical step height/length; good trunk rotation and reciprocal arm swing.  Mild sway with head turns (L > R), but able to self-recover without external assist from therapist  Stairs            Wheelchair Mobility    Modified Rankin (Stroke Patients Only)       Balance Overall balance assessment: Needs assistance Sitting-balance support: No upper extremity supported;Feet supported Sitting balance-Leahy Scale: Good     Standing balance support: No upper extremity supported Standing balance-Leahy Scale: Fair                   Standardized Balance Assessment Standardized Balance Assessment :  (Modified DGI 10/12 with point deducted for sway with L lateral head turn and limited ability to modulate gait speed)           Pertinent Vitals/Pain Pain Assessment: Faces Faces Pain Scale: Hurts little more Pain Location: headache, L sided Pain Descriptors / Indicators: Aching Pain Intervention(s): Limited activity within patient's tolerance;Monitored during session;Repositioned    Home Living Family/patient expects to be discharged to:: Private residence Living Arrangements: Non-relatives/Friends Available Help at Discharge: Friend(s) Type of Home: House Home Access: Stairs to enter   [employer] of Steps: one step. Home Layout: One level Home Equipment: None      Prior Function Level of Independence: Independent         Comments: Indep with ADLs, household and community mobility; + driving; working full-time     Journalist, newspaper   Dominant Hand: Right    Extremity/Trunk Assessment   Upper Extremity Assessment:  (R UE grossly 4/5 throughout; light touch, localization, proprioception intact  without extinction; mild delay in speed of movement/coordination.  No drift appreciated)           Lower Extremity Assessment:  (R LE grossly 4+/5  throughout; light touch, localization, proprioception intact without extinction; negative clonus, babinski)         Communication   Communication: No difficulties  Cognition Arousal/Alertness: Awake/alert Behavior During Therapy: WFL for tasks assessed/performed Overall Cognitive Status: Within Functional Limits for tasks assessed                      General Comments      Exercises        Assessment/Plan    PT Assessment Patient needs continued PT services  PT Diagnosis Difficulty walking;Generalized weakness   PT Problem List Decreased balance  PT Treatment Interventions DME instruction;Gait training;Stair training;Functional mobility training;Therapeutic activities;Therapeutic exercise;Balance training;Patient/family education   PT Goals (Current goals can be found in the Care Plan section) Acute Rehab PT Goals Patient Stated Goal: To return home PT Goal Formulation: With patient Time For Goal Achievement: 02/19/16 Potential to Achieve Goals: Good Additional Goals Additional Goal #1: Modified DGI >10/12 for optimal safety/indep wtih functional mobility    Frequency Min 2X/week   Barriers to discharge        Co-evaluation               End of Session Equipment Utilized During Treatment: Gait belt Activity Tolerance: Patient tolerated treatment well Patient left: in chair;with call bell/phone within reach;with chair alarm set Nurse Communication: Mobility status    Functional Assessment Tool Used: clinical judgement, modified DGI Functional Limitation: Mobility: Walking and moving around Mobility: Walking and Moving Around Current Status [id number]): At least 1 percent but less than 20 percent impaired, limited or restricted Mobility: Walking and Moving Around Goal Status [phone]): 0 percent impaired, limited or restricted    Time: 1155-1210 PT Time Calculation (min) (ACUTE ONLY): 15 min   Charges:   PT Evaluation $PT Eval Low Complexity: 1  Procedure     PT G Codes:   PT G-Codes **NOT FOR INPATIENT CLASS** Functional Assessment Tool Used: clinical judgement, modified DGI Functional Limitation: Mobility: Walking and moving around Mobility: Walking and Moving Around Current Status [id number]): At least 1 percent but less than 20 percent impaired, limited or restricted Mobility: Walking and Moving Around Goal Status [phone]): 0 percent impaired, limited or restricted    Darletta Noblett H. Owens Shark, PT, DPT, NCS 02/05/16, 2:48 PM [phone]

## 2016-02-05 NOTE — Discharge Instructions (Signed)

## 2016-02-05 NOTE — Evaluation (Signed)
Occupational Therapy Evaluation Patient Details Name: Shirley Rogers MRN: [id number] DOB: 1965-08-14 Today's Date: 02/05/2016    History of Present Illness Pt. is a 50 y.o. female who was admitted with facial droop, and workup for CVA, Patient has a history of hemiplegic migraine, but also has a history of a prior stroke. Pt.'s normal hemiplegic migraine presents as left upper and right lower facial weakness with right upper extremity weakness. They can last anywhere from a few hours to several weeks.   Clinical Impression   Pt. Is a 50 y.o. Female who was admitted with left facial droop, and workup for CVA. Pt. Presents with RUE weakness, decreased activity tolerance, and decreased coordination skills which hinder her ability to complete ADL tasks. Pt. Could benefit from skilled OT services for ADL training, A/E training, UE there. Ex, balance, and neuro-muscular re-ed to improve LUE functioning for ADL and IADLs.    Follow Up Recommendations  Outpatient OT    Equipment Recommendations       Recommendations for Other Services       Precautions / Restrictions        Mobility Bed Mobility Overal bed mobility: Independent                Transfers Overall transfer level: Needs assistance   Transfers: Sit to/from Stand Sit to Stand: Supervision              Balance Overall balance assessment: Needs assistance   Sitting balance-Leahy Scale: Good       Standing balance-Leahy Scale: Fair (Pt. reports difficulty getting in and out of a tub.)                              ADL Overall ADL's : Needs assistance/impaired Eating/Feeding: Set up   Grooming: Set up               Lower Body Dressing: Supervision/safety               Functional mobility during ADLs: Supervision/safety General ADL Comments: Pt. has difficulty using her right hand for gripping to open jars, or turn door knobs.      Vision     Perception     Praxis       Pertinent Vitals/Pain Pain Assessment: 0-10 Pain Score: 0-No pain     Hand Dominance Right   Extremity/Trunk Assessment Upper Extremity Assessment Upper Extremity Assessment: RUE deficits/detail (RUE strength: shoulder flexion, abduction, 4/5, elbow flexion, extension 4+/5, wrist flexion, extension 4/5, thumb abduction 4-/5, Grip strength: Right: 13#, Left 65#, Right UE: Intact light touch, proprioception, stereognosis. LUE strength: 5/5 overall.)           Communication Communication Communication: No difficulties   Cognition Arousal/Alertness: Awake/alert Behavior During Therapy: WFL for tasks assessed/performed Overall Cognitive Status: Within Functional Limits for tasks assessed                     General Comments       Exercises       Shoulder Instructions      Home Living Family/patient expects to be discharged to:: Private residence Living Arrangements: Non-relatives/Friends Available Help at Discharge: Friend(s) Type of Home: House Home Access: Stairs to enter [employer] of Steps: one step.   Home Layout: One level     Bathroom Shower/Tub: Tub/shower unit;Curtain Shower/tub characteristics: Architectural technologist: Standard Bathroom Accessibility: Yes   Home Equipment: None  Prior Functioning/Environment Level of Independence: Independent             OT Diagnosis: Generalized weakness   OT Problem List: Decreased strength;Impaired UE functional use;Decreased activity tolerance;Impaired balance (sitting and/or standing);Decreased coordination   OT Treatment/Interventions: Self-care/ADL training;Neuromuscular education;Therapeutic exercise;Therapeutic activities;Patient/family education    OT Goals(Current goals can be found in the care plan section) Acute Rehab OT Goals Patient Stated Goal: To return home OT Goal Formulation: With patient Potential to Achieve Goals: Good  OT Frequency: Min 1X/week    Barriers to D/C:            Co-evaluation              End of Session Equipment Utilized During Treatment: Gait belt  Activity Tolerance: Patient tolerated treatment well Patient left: in bed;with call bell/phone within reach;with bed alarm set   Time: [phone] OT Time Calculation (min): 30 min Charges:  OT General Charges $OT Visit: 1 Procedure OT Evaluation $OT Eval Moderate Complexity: 1 Procedure G-Codes: OT G-codes **NOT FOR INPATIENT CLASS** Functional Assessment Tool Used: Clinical judgement based on pt. functional status Functional Limitation: Self care Self Care Current Status [id number]): At least 1 percent but less than 20 percent impaired, limited or restricted Self Care Goal Status [id number]): 0 percent impaired, limited or restricted  Harrel Carina, MS, OTR/L 02/05/2016, 10:55 AM

## 2016-02-05 NOTE — Progress Notes (Signed)
PT Cancellation Note  Patient Details Name: Shirley Rogers MRN: [id number] DOB: [birth year]   Cancelled Treatment:    Reason Eval/Treat Not Completed: Patient at procedure or test/unavailable (Consult received and chart reviewed. Patient currently off unit for diagnostic testing.  Will re-attempt at later time/date as medically appropriate.)   Blanca Carreon H. Owens Shark, PT, DPT, NCS 02/05/16, 10:36 AM [phone]

## 2016-02-05 NOTE — Care Management (Signed)
Patient admitted for  Right hemiparesis .  Pharmacy = [facility] in [place].  PT consult pending.

## 2016-02-05 NOTE — Progress Notes (Signed)
  Echocardiogram 2D Echocardiogram has been performed.  Jennette Dubin 02/05/2016, 2:49 PM

## 2016-02-05 NOTE — Consult Note (Signed)
Reason for Consult:Hemplegic migraine Referring Physician: Manuella Ghazi  CC: Right sided weakness  HPI: Shirley Rogers is an 50 y.o. female with a history of hemiplegic migraine and prior stroke. She states that her normal hemiplegic migraine presents as left upper and right lower facial weakness with right upper extremity weakness. She states that this can last sometimes anywhere from a few hours to several weeks. Previously she was being treated at [facility] clinic with a number of "infusions" which help control her symptoms and reduced her frequency of these events to about one per year. She was seen yesterday in the ED and states that she has a new component, which is numbness in her face and right extremities and that the headache  Was different and was a pressure behind her left eye. Teleneurologist saw her and recommended admission for rule out stroke. Currently rates her headache at a 8/10.  Past Medical History:  Diagnosis Date  . Anxiety    on xanax in the past  . ASCVD (arteriosclerotic cardiovascular disease)   . Barrett esophagus   . Carotid atherosclerosis 2003   on ultrasound  . Colon polyp   . Complicated migraine    with facial drooping  . Concussion    1974,1982,1987,2016,2016  . DDD (degenerative disc disease), lumbar    MRI 2008, L3-4, L5-S1, spondylotic changes, multilevel facet joint hypertrophic changes  . Depression    on paxil, lexapro, cymbalta in the past  . Diverticulosis   . Dizziness due to old head injury   . Family history of adverse reaction to anesthesia    father - slow to wake  . Fatty liver 2013   On CT abdomen/pelvis  . Fibrocystic breast disease   . GERD (gastroesophageal reflux disease)   . Hemiplegic migraine    daily  . Hemorrhoids   . Hyperlipidemia   . Hypertension    no meds currently.  . IBS (irritable bowel syndrome)   . Insomnia chronic  . Kidney stone 2013   on CT abdomen/pelvis  . 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 Migraine   . Narrowing of intervertebral disc space 2011   C5-6  . Normal cardiac stress test 2009   EF 66%  . Recurrent sinusitis   . Recurrent UTI    question about IC in the past  . Sleep apnea    CPAP machine broken  . Stroke ([place])    2000 - no deficits  . Umbilical hernia 0000000   on CT abdomen/pelvis  . Varicose veins    right lower leg  . Vascular spasm ([place])   . Vertigo    from concussive disorder.  seeing neuro 03/23/15    Past Surgical History:  Procedure Laterality Date  . Biopsy Punch Thyroid    . BREAST EXCISIONAL BIOPSY Right 2000   right mass excision - atypical hyperplasia  . BREAST SURGERY Right    atypical hyperplasia  . CHOLECYSTECTOMY N/A 07/24/2015   Procedure: LAPAROSCOPIC CHOLECYSTECTOMY WITH INTRAOPERATIVE CHOLANGIOGRAM;  Surgeon: Leonie Green, MD;  Location: [facility] ORS;  Service: General;  Laterality: N/A;  . COLONOSCOPY W/ BIOPSIES     Removed 5 polyps  . TUBAL LIGATION      Family History  Problem Relation Age of Onset  . Other Mother     IBS  . Hypertension Mother   . Heart disease Father   . Diverticulosis Father   . Migraines  Father   . Other Son     Ulcerative Colitis, Polonidal cyst  . Parkinson's disease Maternal Grandmother   . Cancer Maternal Grandmother   . Mental illness Paternal Grandmother   . Stroke Paternal Grandmother   . Cancer Paternal Grandmother     brain cancer  . Bipolar disorder Paternal Grandmother   . Stroke Maternal Grandfather   . Cancer Maternal Grandfather     Bladder, Prostate/bladder/prostate  . Heart disease Paternal Grandfather   . Heart attack Paternal Grandfather   . Macular degeneration    . Diabetes Brother   . Breast cancer Maternal Aunt     Social History:  reports that she has been smoking Cigarettes.  She has a 0.25 pack-year smoking history. She has never used smokeless tobacco. She reports that she drinks alcohol. She reports that  she does not use drugs.  Allergies  Allergen Reactions  . Rocephin [Ceftriaxone] Anaphylaxis  . Statins Anaphylaxis, Diarrhea and Nausea And Vomiting  . Compazine [Prochlorperazine] Other (See Comments)    akathesia akathesia  . Doxycycline Nausea And Vomiting  . Erythromycin Itching, Nausea And Vomiting and Rash    Patient states she is allergic to all mycin drugs  . Sulfa Antibiotics Diarrhea and Nausea And Vomiting  . Meloxicam   . Sulfamethoxazole-Trimethoprim Other (See Comments)  . Ace Inhibitors Itching  . Biaxin [Clarithromycin] Itching, Nausea And Vomiting and Rash  . Latex Rash  . Morphine And Related Nausea And Vomiting    Medications:  I have reviewed the patient's current medications. Prior to Admission:  Prescriptions Prior to Admission  Medication Sig Dispense Refill Last Dose  . albuterol (PROVENTIL HFA;VENTOLIN HFA) 108 (90 Base) MCG/ACT inhaler Inhale 2 puffs into the lungs every 4 (four) hours as needed for wheezing or shortness of breath.   prn at prn  . busPIRone (BUSPAR) 5 MG tablet Take 5-15 mg by mouth 3 (three) times daily as needed.   02/03/2016 at 2100  . citalopram (CELEXA) 20 MG tablet Take 30 mg by mouth daily.   02/03/2016 at 2100  . diphenhydrAMINE (BENADRYL) 25 mg capsule Take 2 capsules (50 mg total) by mouth every 6 (six) hours as needed. 60 capsule 0 02/03/2016 at 2100  . EPINEPHrine (EPIPEN 2-PAK) 0.3 mg/0.3 mL IJ SOAJ injection Inject 0.3 mLs (0.3 mg total) into the muscle once. 1 Device 12 02/04/2016 at 1435  . gabapentin (NEURONTIN) 300 MG capsule Take 900 mg by mouth at bedtime.    02/03/2016 at 2100  . hydrochlorothiazide (HYDRODIURIL) 25 MG tablet Take 1 tablet (25 mg total) by mouth daily. 90 tablet 1 02/03/2016 at 2100  . lidocaine (XYLOCAINE) 2 % solution    prn at prn  . magnesium oxide (MAG-OX) 400 MG tablet Take 400 mg by mouth daily. Reported on 05/14/2015   02/03/2016 at 2100  . metoCLOPramide (REGLAN) 10 MG tablet Take 1 tablet (10 mg total) by  mouth 4 (four) times daily -  before meals and at bedtime. 60 tablet 0 02/03/2016 at 2100  . omeprazole (PRILOSEC) 40 MG capsule Take 40 mg by mouth daily.   02/03/2016 at 2100  . predniSONE (DELTASONE) 20 MG tablet Take 1 tablet (20 mg total) by mouth daily with breakfast. 3 tabs x 1 wk, 2 tabs x 1 wk, 1 tab x 1 wk (Patient taking differently: Take 20 mg by mouth daily. 3 tabs x 1 wk, 2 tabs x 1 wk, 1 tab x 1 wk Currently needs one tablet for this  evening And then 1 tablet tomorrow evening, then course is complete) 57 tablet 0 02/03/2016 at 2100  . topiramate (TOPAMAX) 50 MG tablet Take 1 tablet (50 mg total) by mouth 2 (two) times daily. 60 tablet 12 02/03/2016 at 2100  . traMADol (ULTRAM) 50 MG tablet Take 1 tablet (50 mg total) by mouth 2 (two) times daily as needed. (Patient taking differently: Take 100 mg by mouth at bedtime. ) 56 tablet 0 02/03/2016 at Unknown time  . traZODone (DESYREL) 100 MG tablet Take 100 mg by mouth at bedtime as needed for sleep.   02/03/2016 at 2100  . verapamil (CALAN-SR) 240 MG CR tablet Take 240 mg by mouth daily.    02/03/2016 at 2100  . EPINEPHrine 0.3 mg/0.3 mL IJ SOAJ injection     at Unknown time   Scheduled: . citalopram  30 mg Oral Daily  . enoxaparin (LOVENOX) injection  40 mg Subcutaneous BID  . gabapentin  900 mg Oral QHS  . metoCLOPramide  10 mg Oral TID AC & HS  . pantoprazole  40 mg Oral Daily  . potassium chloride  40 mEq Oral Once  . predniSONE  20 mg Oral Daily  . thiamine injection  100 mg Intravenous Daily  . topiramate  50 mg Oral BID    ROS: History obtained from the patient  General ROS: negative for - chills, fatigue, fever, night sweats, weight gain or weight loss Psychological ROS: negative for - behavioral disorder, hallucinations, memory difficulties, mood swings or suicidal ideation Ophthalmic ROS: negative for - blurry vision, double vision, eye pain or loss of vision ENT ROS: negative for - epistaxis, nasal discharge, oral lesions, sore  throat, tinnitus or vertigo Allergy and Immunology ROS: negative for - hives or itchy/watery eyes Hematological and Lymphatic ROS: negative for - bleeding problems, bruising or swollen lymph nodes Endocrine ROS: negative for - galactorrhea, hair pattern changes, polydipsia/polyuria or temperature intolerance Respiratory ROS: negative for - cough, hemoptysis, shortness of breath or wheezing Cardiovascular ROS: negative for - chest pain, dyspnea on exertion, edema or irregular heartbeat Gastrointestinal ROS: negative for - abdominal pain, diarrhea, hematemesis, nausea/vomiting or stool incontinence Genito-Urinary ROS: negative for - dysuria, hematuria, incontinence or urinary frequency/urgency Musculoskeletal ROS: negative for - joint swelling or muscular weakness Neurological ROS: as noted in HPI Dermatological ROS: negative for rash and skin lesion changes  Physical Examination: Blood pressure (!) 141/88, pulse 85, temperature 97.7 F (36.5 C), temperature source Oral, resp. rate 16, height 5\' 4"  (1.626 m), weight 109.3 kg (241 lb), last menstrual period 07/01/2013, SpO2 99 %.  HEENT-  Normocephalic, no lesions, without obvious abnormality.  Normal external eye and conjunctiva.  Normal TM's bilaterally.  Normal auditory canals and external ears. Normal external nose, mucus membranes and septum.  Normal pharynx. Cardiovascular- S1, S2 normal, pulses palpable throughout   Lungs- chest clear, no wheezing, rales, normal symmetric air entry Abdomen- soft, non-tender; bowel sounds normal; no masses,  no organomegaly Extremities- no edema Lymph-no adenopathy palpable Musculoskeletal-no joint tenderness, deformity or swelling Skin-warm and dry, no hyperpigmentation, vitiligo, or suspicious lesions  Neurological Examination Mental Status: Alert, oriented, thought content appropriate.  Speech fluent without evidence of aphasia.  Able to follow 3 step commands without difficulty. Cranial  Nerves: II: Discs flat bilaterally; Visual fields grossly normal, pupils equal, round, reactive to light and accommodation III,IV, VI: ptosis not present, extra-ocular motions intact bilaterally V,VII: right facial droop, facial light touch sensation decreased on the left VIII: hearing normal bilaterally IX,X:  gag reflex present XI: bilateral shoulder shrug XII: tongue extension to the right Motor: Right : Upper extremity   5-/5    Left:     Upper extremity   5/5  Lower extremity   5/5     Lower extremity   5/5 Tone and bulk:normal tone throughout; no atrophy noted Sensory: Pinprick and light touch decreased in the RUE and RLE Deep Tendon Reflexes: 2+ and symmetric throughout Plantars: Right: downgoing   Left: downgoing Cerebellar: Normal finger-to-nose and normal heel-to-shin testing bilaterally Gait: normal gait    Laboratory Studies:   Basic Metabolic Panel:  Recent Labs Lab 02/04/16 1833  NA 138  K 3.4*  CL 104  CO2 27  GLUCOSE 110*  BUN 17  CREATININE 0.76  CALCIUM 8.4*    Liver Function Tests:  Recent Labs Lab 02/04/16 1833  AST 23  ALT 25  ALKPHOS 52  BILITOT 0.3  PROT 6.9  ALBUMIN 3.9   No results for input(s): LIPASE, AMYLASE in the last 168 hours. No results for input(s): AMMONIA in the last 168 hours.  CBC:  Recent Labs Lab 02/04/16 1833  WBC 10.4  NEUTROABS 6.8*  HGB 14.3  HCT 40.8  MCV 88.5  PLT 196    Cardiac Enzymes:  Recent Labs Lab 02/04/16 1833  TROPONINI <0.03    BNP: Invalid input(s): POCBNP  CBG:  Recent Labs Lab 02/04/16 1808  GLUCAP 118*    Microbiology: Results for orders placed or performed in visit on 12/08/15  GC/Chlamydia Probe Amp     Status: None   Collection Time: 12/08/15 10:13 AM  Result Value Ref Range Status   Chlamydia trachomatis, NAA Negative Negative Final   Neisseria gonorrhoeae by PCR Negative Negative Final  Microscopic Examination     Status: Abnormal   Collection Time: 12/08/15  10:13 AM  Result Value Ref Range Status   WBC, UA None seen 0 - 5 /hpf Final   RBC, UA 0-2 0 - 2 /hpf Final   Epithelial Cells (non renal) 0-10 0 - 10 /hpf Final   Crystals Present (A) N/A Final   Crystal Type Amorphous Sediment N/A Final   Bacteria, UA Few None seen/Few Final    Coagulation Studies:  Recent Labs  02/04/16 1833  LABPROT 13.1  INR 0.99    Urinalysis:  Recent Labs Lab 02/04/16 1834  COLORURINE STRAW*  LABSPEC 1.008  PHURINE 7.0  GLUCOSEU NEGATIVE  HGBUR NEGATIVE  BILIRUBINUR NEGATIVE  KETONESUR NEGATIVE  PROTEINUR NEGATIVE  NITRITE NEGATIVE  LEUKOCYTESUR NEGATIVE    Lipid Panel:     Component Value Date/Time   CHOL 260 (H) 02/05/2016 0453   CHOL 281 (H) 12/08/2015 1013   TRIG 139 02/05/2016 0453   HDL 54 02/05/2016 0453   HDL 56 12/08/2015 1013   CHOLHDL 4.8 02/05/2016 0453   VLDL 28 02/05/2016 0453   LDLCALC 178 (H) 02/05/2016 0453   LDLCALC 187 (H) 12/08/2015 1013    HgbA1C:  Lab Results  Component Value Date   HGBA1C 5.7 02/05/2016    Urine Drug Screen:  No results found for: LABOPIA, COCAINSCRNUR, LABBENZ, AMPHETMU, THCU, LABBARB  Alcohol Level:  Recent Labs Lab 02/04/16 [place] <5    Other results: EKG:  normal sinus rhythm at 85 bpm.  Imaging: Ct Angio Head W Or Wo Contrast  Result Date: 02/04/2016 CLINICAL DATA:  Initial valuation for acute left facial droop. EXAM: CT ANGIOGRAPHY HEAD AND NECK TECHNIQUE: Multidetector CT imaging of the head and neck  was performed using the standard protocol during bolus administration of intravenous contrast. Multiplanar CT image reconstructions and MIPs were obtained to evaluate the vascular anatomy. Carotid stenosis measurements (when applicable) are obtained utilizing NASCET criteria, using the distal internal carotid diameter as the denominator. COMPARISON:  Prior noncontrast head CT from earlier the same day. FINDINGS: CTA NECK Aortic arch: Visualized aortic arch of normal caliber with  normal 3 vessel morphology. No high-grade stenosis seen at the origin of the great vessels. Visualized subclavian arteries widely patent. Right carotid system: Right common carotid artery patent from its origin to the bifurcation. Mild a centric calcified plaque about the right bifurcation without significant stenosis. Right ICA patent from the bifurcation to the skullbase without stenosis, dissection, or occlusion. Left carotid system: Left common carotid artery patent from its origin to the bifurcation. Mild eccentric calcified plaque about the left bifurcation without high-grade stenosis. Left ICA patent distally without stenosis, dissection, or occlusion. Vertebral arteries:Both vertebral arteries arise from the subclavian arteries. Vertebral arteries widely patent within the neck without stenosis, dissection, or occlusion. Skeleton: No acute osseous abnormality. No worrisome lytic or blastic osseous lesions. Other neck: Visualized lungs are clear. Visualize mediastinum within normal limits. Thyroid gland normal. No adenopathy within neck. No acute soft tissue abnormality. The CTA HEAD Anterior circulation: Petrous, cavernous, and supraclinoid segments are widely patent without flow-limiting stenosis. A1 segments patent. Anterior communicating artery normal. Anterior cerebral arteries patent to their distal aspects. M1 segments patent without stenosis or occlusion. MCA bifurcations normal. Distal MCA branches well opacified and symmetric. Posterior circulation: Vertebral arteries patent to the vertebrobasilar junction. Posterior inferior cerebral arteries patent. Basilar artery mildly tortuous but well opacified to its distal aspect. Superior cerebellar is patent bilaterally. Left PCA supplied via the basilar artery as well as a small left posterior communicating artery. Left PCA well supplied to its distal aspect. Fetal type right PCA supplied via a right P com. Right PCA also patent to its distal aspect.  Venous sinuses: Patent without evidence for venous sinus thrombosis. Anatomic variants: Fetal type right PCA. No aneurysm or vascular malformation. Delayed phase: No pathologic enhancement. IMPRESSION: 1. Negative CTA of the head and neck. No large vessel occlusion. No high-grade or correctable stenosis. 2. Mild atheromatous plaque about the carotid bifurcations bilaterally without significant stenosis. Electronically Signed   By: Jeannine Boga M.D.   On: 02/04/2016 20:28   Ct Head Wo Contrast  Result Date: 02/04/2016 CLINICAL DATA:  Code stroke, LEFT facial droop, history of hemiplegia migraines with RIGHT facial drooping, generalized RIGHT side weakness, today LEFT side facial drooping normally, history of lupus, hypertension, stroke, former smoker EXAM: CT HEAD WITHOUT CONTRAST TECHNIQUE: Contiguous axial images were obtained from the base of the skull through the vertex without intravenous contrast. COMPARISON:  12/26/2014 FINDINGS: Normal ventricular morphology. No midline shift or mass effect. Small old lacunar infarct at the RIGHT basal ganglia posteriorly. Lucency at the inferior LEFT basal ganglia question cyst or prominent perivascular space unchanged. No intracranial hemorrhage, mass lesion, evidence of acute infarction or extra-axial fluid collections. Nasal septal deviation to the RIGHT. Bones and sinuses otherwise unremarkable. IMPRESSION: No acute intracranial abnormalities. Small old lacunar infarct at RIGHT basal ganglia. Findings called to Dr. Jimmye Norman on 02/04/2016 at 1752 hr. Electronically Signed   By: Lavonia Dana M.D.   On: 02/04/2016 17:52   Ct Angio Neck W And/or Wo Contrast  Result Date: 02/04/2016 CLINICAL DATA:  Initial valuation for acute left facial droop. EXAM: CT ANGIOGRAPHY HEAD AND  NECK TECHNIQUE: Multidetector CT imaging of the head and neck was performed using the standard protocol during bolus administration of intravenous contrast. Multiplanar CT image  reconstructions and MIPs were obtained to evaluate the vascular anatomy. Carotid stenosis measurements (when applicable) are obtained utilizing NASCET criteria, using the distal internal carotid diameter as the denominator. COMPARISON:  Prior noncontrast head CT from earlier the same day. FINDINGS: CTA NECK Aortic arch: Visualized aortic arch of normal caliber with normal 3 vessel morphology. No high-grade stenosis seen at the origin of the great vessels. Visualized subclavian arteries widely patent. Right carotid system: Right common carotid artery patent from its origin to the bifurcation. Mild a centric calcified plaque about the right bifurcation without significant stenosis. Right ICA patent from the bifurcation to the skullbase without stenosis, dissection, or occlusion. Left carotid system: Left common carotid artery patent from its origin to the bifurcation. Mild eccentric calcified plaque about the left bifurcation without high-grade stenosis. Left ICA patent distally without stenosis, dissection, or occlusion. Vertebral arteries:Both vertebral arteries arise from the subclavian arteries. Vertebral arteries widely patent within the neck without stenosis, dissection, or occlusion. Skeleton: No acute osseous abnormality. No worrisome lytic or blastic osseous lesions. Other neck: Visualized lungs are clear. Visualize mediastinum within normal limits. Thyroid gland normal. No adenopathy within neck. No acute soft tissue abnormality. The CTA HEAD Anterior circulation: Petrous, cavernous, and supraclinoid segments are widely patent without flow-limiting stenosis. A1 segments patent. Anterior communicating artery normal. Anterior cerebral arteries patent to their distal aspects. M1 segments patent without stenosis or occlusion. MCA bifurcations normal. Distal MCA branches well opacified and symmetric. Posterior circulation: Vertebral arteries patent to the vertebrobasilar junction. Posterior inferior cerebral  arteries patent. Basilar artery mildly tortuous but well opacified to its distal aspect. Superior cerebellar is patent bilaterally. Left PCA supplied via the basilar artery as well as a small left posterior communicating artery. Left PCA well supplied to its distal aspect. Fetal type right PCA supplied via a right P com. Right PCA also patent to its distal aspect. Venous sinuses: Patent without evidence for venous sinus thrombosis. Anatomic variants: Fetal type right PCA. No aneurysm or vascular malformation. Delayed phase: No pathologic enhancement. IMPRESSION: 1. Negative CTA of the head and neck. No large vessel occlusion. No high-grade or correctable stenosis. 2. Mild atheromatous plaque about the carotid bifurcations bilaterally without significant stenosis. Electronically Signed   By: Jeannine Boga M.D.   On: 02/04/2016 20:28   Mr Brain Wo Contrast  Result Date: 02/05/2016 CLINICAL DATA:  Right hemiparesis. Numbness in the face and right-sided extremities. History of hemiplegic migraine and stroke. History of lupus. EXAM: MRI HEAD WITHOUT CONTRAST TECHNIQUE: Multiplanar, multiecho pulse sequences of the brain and surrounding structures were obtained without intravenous contrast. COMPARISON:  Head CT/ CTA 02/04/2016 and MRI 09/30/2015 FINDINGS: Brain: There is no evidence of acute infarct, intracranial hemorrhage, mass, midline shift, or extra-axial fluid collection. The ventricles and sulci are normal. A dilated perivascular space is again seen inferior to the left lentiform nucleus. A chronic lacunar infarct at the posterior aspect of the right putamen/external capsule is unchanged. Small foci of T2 hyperintensity scattered elsewhere in the cerebral white matter bilaterally are unchanged from the prior MRI. Vascular: Major intracranial vascular flow voids are preserved. Skull and upper cervical spine: No focal osseous lesion identified. Sinuses/Orbits: Unremarkable. Other: None. IMPRESSION: 1. No  acute intracranial abnormality. 2. Chronic right basal ganglia lacunar infarct. 3. Unchanged cerebral white matter T2 hyperintensities, nonspecific. Considerations include chronic small vessel ischemia,  vasculitis, and complicated migraine among others. Electronically Signed   By: Logan Bores M.D.   On: 02/05/2016 11:04     Assessment/Plan: 50 year old female with a history of hemiplegic migraine who presented with a migraine somewhat atypical for her.  Initially stroke was in the differential.  MRI of the brain personally reviewed and shows no acute changes.  CTA of the head and neck unremarkable.  Suspect this is actually her hemiplegic migraine.  After further conversation it appears that this headache started about a week ago but has just increased in severity.  Should continue work up on an outpatient basis.  Already seeing neurologist at [facility]    Recommendations: 1.  Depakote 500mg  IV, benadryl 25mg  and Vitamin B2 100mg  IV now 2.  If headache improved patient may be discharged home after infusion and continue follow up at [facility].     Alexis Goodell, MD Neurology [phone] 02/05/2016, 3:50 PM

## 2016-02-06 LAB — ECHOCARDIOGRAM COMPLETE
HEIGHTINCHES: 64 in
WEIGHTICAEL: 3856 [oz_av]

## 2016-02-06 NOTE — Discharge Summary (Signed)
[facility] [place] NAME: Shirley Rogers    MR#:  [id number]  DATE OF BIRTH:  [birth year]  DATE OF ADMISSION:  02/04/2016   ADMITTING PHYSICIAN: Lance Coon, MD  DATE OF DISCHARGE: 02/05/2016  4:15 PM  PRIMARY CARE PHYSICIAN: Park Liter, DO   ADMISSION DIAGNOSIS:  Facial droop [R29.810] Acute nonintractable headache, unspecified headache type [R51] DISCHARGE DIAGNOSIS:  Principal Problem:   Facial droop Active Problems:   Barrett esophagus   Hemiplegic migraine   Lupus (HCC)   Depression   Hypertension   Right hemiparesis ([place])  SECONDARY DIAGNOSIS:   Past Medical History:  Diagnosis Date  . Anxiety    on xanax in the past  . ASCVD (arteriosclerotic cardiovascular disease)   . Barrett esophagus   . Carotid atherosclerosis 2003   on ultrasound  . Colon polyp   . Complicated migraine    with facial drooping  . Concussion    1974,1982,1987,2016,2016  . DDD (degenerative disc disease), lumbar    MRI 2008, L3-4, L5-S1, spondylotic changes, multilevel facet joint hypertrophic changes  . Depression    on paxil, lexapro, cymbalta in the past  . Diverticulosis   . Dizziness due to old head injury   . Family history of adverse reaction to anesthesia    father - slow to wake  . Fatty liver 2013   On CT abdomen/pelvis  . Fibrocystic breast disease   . GERD (gastroesophageal reflux disease)   . Hemiplegic migraine    daily  . Hemorrhoids   . Hyperlipidemia   . Hypertension    no meds currently.  . IBS (irritable bowel syndrome)   . Insomnia chronic  . Kidney stone 2013   on CT abdomen/pelvis  . 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 Migraine   . Narrowing of intervertebral disc space 2011   C5-6  . Normal cardiac stress test 2009   EF 66%  . Recurrent sinusitis   . Recurrent UTI    question about IC in the past  . Sleep apnea    CPAP machine  broken  . Stroke ([place])    2000 - no deficits  . Umbilical hernia 0000000   on CT abdomen/pelvis  . Varicose veins    right lower leg  . Vascular spasm ([place])   . Vertigo    from concussive disorder.  seeing neuro 03/23/15   HOSPITAL COURSE:  50 year old female with a history of hemiplegic migraine ADMITTED with a migraine somewhat atypical for her.  Initially stroke was in the differential.  MRI of the brain shows no acute changes.  CTA of the head and neck unremarkable.  Suspect this is actually her hemiplegic migraine.  After further conversation it appears that this headache started about a week ago but has just increased in severity.    She was seen by Neuro and recommended Depakote 500mg  IV, benadryl 25mg  and Vitamin B2 100mg  IV once which helped her symptoms including headache.She was recommended to continue follow up at [facility]   DISCHARGE CONDITIONS:  STABLE CONSULTS OBTAINED:  Treatment Team:  Alexis Goodell, MD DRUG ALLERGIES:   Allergies  Allergen Reactions  . Rocephin [Ceftriaxone] Anaphylaxis  . Statins Anaphylaxis, Diarrhea and Nausea And Vomiting  . Compazine [Prochlorperazine] Other (See Comments)    akathesia akathesia  . Doxycycline Nausea And Vomiting  . Erythromycin Itching, Nausea And Vomiting and  Rash    Patient states she is allergic to all mycin drugs  . Sulfa Antibiotics Diarrhea and Nausea And Vomiting  . Meloxicam   . Sulfamethoxazole-Trimethoprim Other (See Comments)  . Ace Inhibitors Itching  . Biaxin [Clarithromycin] Itching, Nausea And Vomiting and Rash  . Latex Rash  . Morphine And Related Nausea And Vomiting   DISCHARGE MEDICATIONS:     Medication List    TAKE these medications   albuterol 108 (90 Base) MCG/ACT inhaler Commonly known as:  PROVENTIL HFA;VENTOLIN HFA Inhale 2 puffs into the lungs every 4 (four) hours as needed for wheezing or shortness of breath.   busPIRone 5 MG tablet Commonly known as:  BUSPAR Take 5-15 mg by mouth 3  (three) times daily as needed.   citalopram 20 MG tablet Commonly known as:  CELEXA Take 30 mg by mouth daily.   diphenhydrAMINE 25 mg capsule Commonly known as:  BENADRYL Take 2 capsules (50 mg total) by mouth every 6 (six) hours as needed.   EPINEPHrine 0.3 mg/0.3 mL [place]aj injection Commonly known as:  EPIPEN 2-PAK Inject 0.3 mLs (0.3 mg total) into the muscle once.   EPINEPHrine 0.3 mg/0.3 mL Soaj injection Commonly known as:  EPI-PEN   gabapentin 300 MG capsule Commonly known as:  NEURONTIN Take 900 mg by mouth at bedtime.   hydrochlorothiazide 25 MG tablet Commonly known as:  HYDRODIURIL Take 1 tablet (25 mg total) by mouth daily.   lidocaine 2 % solution Commonly known as:  XYLOCAINE   magnesium oxide 400 MG tablet Commonly known as:  MAG-OX Take 400 mg by mouth daily. Reported on 05/14/2015   metoCLOPramide 10 MG tablet Commonly known as:  REGLAN Take 1 tablet (10 mg total) by mouth 4 (four) times daily -  before meals and at bedtime.   omeprazole 40 MG capsule Commonly known as:  PRILOSEC Take 40 mg by mouth daily.   predniSONE 20 MG tablet Commonly known as:  DELTASONE Take 1 tablet (20 mg total) by mouth daily with breakfast. 3 tabs x 1 wk, 2 tabs x 1 wk, 1 tab x 1 wk What changed:  when to take this  additional instructions   topiramate 50 MG tablet Commonly known as:  TOPAMAX Take 1 tablet (50 mg total) by mouth 2 (two) times daily.   traMADol 50 MG tablet Commonly known as:  ULTRAM Take 1 tablet (50 mg total) by mouth 2 (two) times daily as needed. What changed:  how much to take  when to take this   traZODone 100 MG tablet Commonly known as:  DESYREL Take 100 mg by mouth at bedtime as needed for sleep.   verapamil 240 MG CR tablet Commonly known as:  CALAN-SR Take 240 mg by mouth daily.        DISCHARGE INSTRUCTIONS:   DIET:  Regular diet DISCHARGE CONDITION:  Good ACTIVITY:  Activity as tolerated OXYGEN:  Home Oxygen:  No.  Oxygen Delivery: room air DISCHARGE LOCATION:  home   If you experience worsening of your admission symptoms, develop shortness of breath, life threatening emergency, suicidal or homicidal thoughts you must seek medical attention immediately by calling 911 or calling your MD immediately  if symptoms less severe.  You Must read complete instructions/literature along with all the possible adverse reactions/side effects for all the Medicines you take and that have been prescribed to you. Take any new Medicines after you have completely understood and accpet all the possible adverse reactions/side effects.   Please  note  You were cared for by a hospitalist during your hospital stay. If you have any questions about your discharge medications or the care you received while you were in the hospital after you are discharged, you can call the unit and asked to speak with the hospitalist on call if the hospitalist that took care of you is not available. Once you are discharged, your primary care physician will handle any further medical issues. Please note that NO REFILLS for any discharge medications will be authorized once you are discharged, as it is imperative that you return to your primary care physician (or establish a relationship with a primary care physician if you do not have one) for your aftercare needs so that they can reassess your need for medications and monitor your lab values.    On the day of Discharge:  VITAL SIGNS:  Blood pressure (!) 141/88, pulse 85, temperature 97.7 F (36.5 C), temperature source Oral, resp. rate 16, height 5\' 4"  (1.626 m), weight 109.3 kg (241 lb), last menstrual period 07/01/2013, SpO2 99 %. PHYSICAL EXAMINATION:  GENERAL:  50 y.o.-year-old patient lying in the bed with no acute distress.  EYES: Pupils equal, round, reactive to light and accommodation. No scleral icterus. Extraocular muscles intact.  HEENT: Head atraumatic, normocephalic. Oropharynx and  nasopharynx clear.  NECK:  Supple, no jugular venous distention. No thyroid enlargement, no tenderness.  LUNGS: Normal breath sounds bilaterally, no wheezing, rales,rhonchi or crepitation. No use of accessory muscles of respiration.  CARDIOVASCULAR: S1, S2 normal. No murmurs, rubs, or gallops.  ABDOMEN: Soft, non-tender, non-distended. Bowel sounds present. No organomegaly or mass.  EXTREMITIES: No pedal edema, cyanosis, or clubbing.  NEUROLOGIC: Cranial nerves II through XII are intact. Muscle strength 5/5 in all extremities. Sensation intact. Gait not checked.  PSYCHIATRIC: The patient is alert and oriented x 3.  SKIN: No obvious rash, lesion, or ulcer.  DATA REVIEW:   CBC  Recent Labs Lab 02/04/16 1833  WBC 10.4  HGB 14.3  HCT 40.8  PLT 196    Chemistries   Recent Labs Lab 02/04/16 1833  NA 138  K 3.4*  CL 104  CO2 27  GLUCOSE 110*  BUN 17  CREATININE 0.76  CALCIUM 8.4*  AST 23  ALT 25  ALKPHOS 52  BILITOT 0.3    Follow-up Information    SEVILIS, [place]anger, DO. Schedule an appointment as soon as possible for a visit in 1 week(s).   Specialty:  Neurology Why:  [phone] Fax notes and MD referal. Contact information: [address] [place] [address] [place], [place]. Schedule an appointment as soon as possible for a visit in 2 week(s).   Specialty:  Family Medicine Why:  02/19/2016 at 10:30 Contact information: [place] [address] [phone]            Management plans discussed with the patient, family and they are in agreement.  CODE STATUS: FULL CODE  TOTAL TIME TAKING CARE OF THIS PATIENT: 45 minutes.    [facility], Dola Lunsford M.D on 02/06/2016 at 2:59 PM  Between 7am to 6pm - Pager - [phone]  After 6pm go to [email or web address] - Proofreader  [facility] [place] [facility]  Office  [phone]  CC: Primary care physician; Park Liter, DO   Note: This dictation was prepared  with Dragon dictation along with smaller phrase technology. Any transcriptional errors that result from this process are unintentional.

## 2016-02-16 ENCOUNTER — Other Ambulatory Visit: Payer: Self-pay | Admitting: Family Medicine

## 2016-02-16 MED ORDER — TRAMADOL HCL 50 MG PO TABS: 50.0000 mg | ORAL_TABLET | Freq: Two times a day (BID) | ORAL | 0 refills | Status: DC | PRN

## 2016-02-19 ENCOUNTER — Encounter: Payer: Self-pay | Admitting: Family Medicine

## 2016-02-19 ENCOUNTER — Ambulatory Visit (INDEPENDENT_AMBULATORY_CARE_PROVIDER_SITE_OTHER): Payer: BC Managed Care – PPO | Admitting: Family Medicine

## 2016-02-19 VITALS — BP 115/82 | HR 88 | Temp 99.2°F | Wt 252.0 lb

## 2016-02-19 DIAGNOSIS — Z87440 Personal history of urinary (tract) infections: Secondary | ICD-10-CM | POA: Diagnosis not present

## 2016-02-19 DIAGNOSIS — M329 Systemic lupus erythematosus, unspecified: Secondary | ICD-10-CM | POA: Diagnosis not present

## 2016-02-19 DIAGNOSIS — F32A Depression, unspecified: Secondary | ICD-10-CM

## 2016-02-19 DIAGNOSIS — F329 Major depressive disorder, single episode, unspecified: Secondary | ICD-10-CM | POA: Diagnosis not present

## 2016-02-19 DIAGNOSIS — E041 Nontoxic single thyroid nodule: Secondary | ICD-10-CM | POA: Diagnosis not present

## 2016-02-19 DIAGNOSIS — IMO0002 Reserved for concepts with insufficient information to code with codable children: Secondary | ICD-10-CM

## 2016-02-19 DIAGNOSIS — G43409 Hemiplegic migraine, not intractable, without status migrainosus: Secondary | ICD-10-CM | POA: Diagnosis not present

## 2016-02-19 MED ORDER — DIPHENHYDRAMINE HCL 25 MG PO CAPS: 50.0000 mg | ORAL_CAPSULE | Freq: Four times a day (QID) | ORAL | 12 refills | Status: DC | PRN

## 2016-02-19 MED ORDER — CYCLOBENZAPRINE HCL 10 MG PO TABS: 10.0000 mg | ORAL_TABLET | Freq: Every day | ORAL | 2 refills | Status: DC

## 2016-02-19 MED ORDER — CITALOPRAM HYDROBROMIDE 40 MG PO TABS: 60.0000 mg | ORAL_TABLET | Freq: Every day | ORAL | 3 refills | Status: DC

## 2016-02-19 NOTE — Assessment & Plan Note (Signed)
Unclear if this is the issue or Bechets. Currently not on any treatment for this. Continue to follow with rheumatology. Continue to monitor.

## 2016-02-19 NOTE — Assessment & Plan Note (Signed)
Due for repeat US and TSH in December 2017

## 2016-02-19 NOTE — Assessment & Plan Note (Signed)
Not under good control. This last one has residual effects x 2 weeks concerning for stroke, but MRI and CTA were negative. Will get her back into her neurologist. She would also like a 2nd opinion- referral generated today.

## 2016-02-19 NOTE — Progress Notes (Signed)
BP 115/82 (BP Location: Left Arm, Patient Position: Sitting, Cuff Size: Large)   Pulse 88   Temp 99.2 F (37.3 C)   Wt 252 lb (114.3 kg)   LMP 07/01/2013   SpO2 97%   BMI 43.26 kg/m    Subjective:    Patient ID: Shirley Rogers, female    DOB: 1966-03-12, 50 y.o.   MRN: [id number]  HPI: Shirley Rogers is a 50 y.o. female  Chief Complaint  Patient presents with  . Hospitalization Follow-up   HOSPITAL FOLLOW UP Time since discharge: 14 days Hospital/facility: [facility] Diagnosis: hemiplegic migraine, facial droop, lupus, depression, barett's esophagus, HTN, R hemiparesis  Procedures/tests: MRI brain- no acute changes, CTA head and neck- unremarkable Consultants: Neurology- depakote 500mg  IV, benadryl, vitamin B2 New medications:  Discharge instructions:  Follow up with neurology at [facility] and here Status: worse  Feels like this was different than her last migraine. Still having facial droop. Still weak in her R arm. Hasn't resolved, very concerned and wondering what is happening. Doesn't have an appointment to see neurology any time soon. Called them, without response.  Saw endocrine in June- does not need biopsy of her thyroid nodule, just needs follow up TSH and [place] in December.   Couldn't get in to see urology, there was a mix up with her appointment. Still very frustrated with it. Would like to see someone local.   [facility] in July- got CTA, possibly needing to see headache specialist, CTA normal. Started on crestor 2.5mg  and an aspirin. Due to see her again in November. Would like to get another opinion about her migraines and possibly her rheumatology, but will start with neurology.  Anxiety and mood not doing well. Already on 40mg  of citalopram. Doesn't feel like it's helping much.   Relevant past medical, surgical, family and social history reviewed and updated as indicated. Interim medical history since our last visit reviewed. Allergies and medications  reviewed and updated.  Review of Systems  Constitutional: Negative for activity change, appetite change, chills, diaphoresis, fatigue, fever and unexpected weight change.  Respiratory: Negative.   Cardiovascular: Negative.   Neurological: Positive for dizziness, facial asymmetry, weakness, light-headedness and headaches. Negative for tremors, seizures, syncope, speech difficulty and numbness.  Psychiatric/Behavioral: Positive for dysphoric mood and sleep disturbance. Negative for agitation, behavioral problems, confusion, decreased concentration, hallucinations, self-injury and suicidal ideas. The patient is nervous/anxious. The patient is not hyperactive.     Per HPI unless specifically indicated above     Objective:    BP 115/82 (BP Location: Left Arm, Patient Position: Sitting, Cuff Size: Large)   Pulse 88   Temp 99.2 F (37.3 C)   Wt 252 lb (114.3 kg)   LMP 07/01/2013   SpO2 97%   BMI 43.26 kg/m   Wt Readings from Last 3 Encounters:  02/19/16 252 lb (114.3 kg)  02/04/16 241 lb (109.3 kg)  01/14/16 256 lb (116.1 kg)    Physical Exam  Constitutional: She is oriented to person, place, and time. She appears well-developed and well-nourished. No distress.  HENT:  Head: Normocephalic and atraumatic.  Right Ear: Hearing normal.  Left Ear: Hearing normal.  Nose: Nose normal.  Eyes: Conjunctivae and lids are normal. Right eye exhibits no discharge. Left eye exhibits no discharge. No scleral icterus.  Cardiovascular: Normal rate, regular rhythm, normal heart sounds and intact distal pulses.  Exam reveals no gallop and no friction rub.   No murmur heard. Pulmonary/Chest: Effort normal and breath  sounds normal. No respiratory distress. She has no wheezes. She has no rales. She exhibits no tenderness.  Musculoskeletal: Normal range of motion.  Neurological: She is alert and oriented to person, place, and time. She displays normal reflexes. A cranial nerve deficit is present. She  exhibits abnormal muscle tone. Coordination normal.  Weakness to hand grip 4/5 on the R, 5/5 on the L, facial droop on the L  Skin: Skin is warm, dry and intact. No rash noted. She is not diaphoretic. No erythema. No pallor.  Psychiatric: She has a normal mood and affect. Her speech is normal and behavior is normal. Judgment and thought content normal. Cognition and memory are normal.  Nursing note and vitals reviewed.   Results for orders placed or performed during the hospital encounter of 02/04/16  Ethanol  Result Value Ref Range   Alcohol, Ethyl (B) <5 <5 mg/dL  Protime-INR  Result Value Ref Range   Prothrombin Time 13.1 11.4 - 15.2 seconds   INR 0.99   APTT  Result Value Ref Range   aPTT 25 24 - 36 seconds  CBC  Result Value Ref Range   WBC 10.4 3.6 - 11.0 K/uL   RBC 4.61 3.80 - 5.20 MIL/uL   Hemoglobin 14.3 12.0 - 16.0 g/dL   HCT 40.8 35.0 - 47.0 %   MCV 88.5 80.0 - 100.0 fL   MCH 31.0 26.0 - 34.0 pg   MCHC 35.0 32.0 - 36.0 g/dL   RDW 13.8 11.5 - 14.5 %   Platelets 196 150 - 440 K/uL  Differential  Result Value Ref Range   Neutrophils Relative % 65 %   Neutro Abs 6.8 (H) 1.4 - 6.5 K/uL   Lymphocytes Relative 26 %   Lymphs Abs 2.7 1.0 - 3.6 K/uL   Monocytes Relative 7 %   Monocytes Absolute 0.8 0.2 - 0.9 K/uL   Eosinophils Relative 1 %   Eosinophils Absolute 0.1 0 - 0.7 K/uL   Basophils Relative 1 %   Basophils Absolute 0.1 0 - 0.1 K/uL  Comprehensive metabolic panel  Result Value Ref Range   Sodium 138 135 - 145 mmol/L   Potassium 3.4 (L) 3.5 - 5.1 mmol/L   Chloride 104 101 - 111 mmol/L   CO2 27 22 - 32 mmol/L   Glucose, Bld 110 (H) 65 - 99 mg/dL   BUN 17 6 - 20 mg/dL   Creatinine, Ser 0.76 0.44 - 1.00 mg/dL   Calcium 8.4 (L) 8.9 - 10.3 mg/dL   Total Protein 6.9 6.5 - 8.1 g/dL   Albumin 3.9 3.5 - 5.0 g/dL   AST 23 15 - 41 U/L   ALT 25 14 - 54 U/L   Alkaline Phosphatase 52 38 - 126 U/L   Total Bilirubin 0.3 0.3 - 1.2 mg/dL   GFR calc non Af Amer >60 >60  mL/min   GFR calc Af Amer >60 >60 mL/min   Anion gap 7 5 - 15  Troponin I  Result Value Ref Range   Troponin I <0.03 <0.03 ng/mL  Urinalysis complete, with microscopic ([facility] only)  Result Value Ref Range   Color, Urine STRAW (A) YELLOW   APPearance CLEAR (A) CLEAR   Glucose, UA NEGATIVE NEGATIVE mg/dL   Bilirubin Urine NEGATIVE NEGATIVE   Ketones, ur NEGATIVE NEGATIVE mg/dL   Specific Gravity, Urine 1.008 1.005 - 1.030   Hgb urine dipstick NEGATIVE NEGATIVE   pH 7.0 5.0 - 8.0   Protein, ur NEGATIVE NEGATIVE mg/dL  Nitrite NEGATIVE NEGATIVE   Leukocytes, UA NEGATIVE NEGATIVE   RBC / HPF 0-5 0 - 5 RBC/hpf   WBC, UA 0-5 0 - 5 WBC/hpf   Bacteria, UA RARE (A) NONE SEEN   Squamous Epithelial / LPF 0-5 (A) NONE SEEN   Mucous PRESENT   Glucose, capillary  Result Value Ref Range   Glucose-Capillary 118 (H) 65 - 99 mg/dL  Hemoglobin A1c  Result Value Ref Range   Hgb A1c MFr Bld 5.7 4.0 - 6.0 %  Lipid panel  Result Value Ref Range   Cholesterol 260 (H) 0 - 200 mg/dL   Triglycerides 139 <150 mg/dL   HDL 54 >40 mg/dL   Total CHOL/HDL Ratio 4.8 RATIO   VLDL 28 0 - 40 mg/dL   LDL Cholesterol 178 (H) 0 - 99 mg/dL  Echocardiogram  Result Value Ref Range   Weight 3,856 oz   Height 64 in   BP 141/88 mmHg      Assessment & Plan:   Problem List Items Addressed This Visit      Cardiovascular and Mediastinum   Hemiplegic migraine - Primary    Not under good control. This last one has residual effects x 2 weeks concerning for stroke, but MRI and CTA were negative. Will get her back into her neurologist. She would also like a 2nd opinion- referral generated today.      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 Currently not on any treatment for this. Continue to follow with rheumatology. Continue to monitor.       Depression    Exacerbated. Anxiety especially. Will increase her citalopram to 60mg  and recheck in about 3 weeks. Continue to monitor. Call with concerns.       Relevant Medications   citalopram (CELEXA) 40 MG tablet    Other Visit Diagnoses    History of cystitis       Would like to see local urologist. Referral made today.   Relevant Orders   Ambulatory referral to Urology       Follow up plan: Return in about 3 weeks (around 03/11/2016).

## 2016-02-19 NOTE — Assessment & Plan Note (Signed)
Exacerbated. Anxiety especially. Will increase her citalopram to 60mg  and recheck in about 3 weeks. Continue to monitor. Call with concerns.

## 2016-02-25 ENCOUNTER — Ambulatory Visit (INDEPENDENT_AMBULATORY_CARE_PROVIDER_SITE_OTHER): Payer: BC Managed Care – PPO | Admitting: Neurology

## 2016-02-25 ENCOUNTER — Encounter: Payer: Self-pay | Admitting: Neurology

## 2016-02-25 VITALS — BP 144/96 | HR 84 | Resp 20 | Ht 65.0 in | Wt 232.0 lb

## 2016-02-25 DIAGNOSIS — G4730 Sleep apnea, unspecified: Secondary | ICD-10-CM | POA: Diagnosis not present

## 2016-02-25 DIAGNOSIS — G4734 Idiopathic sleep related nonobstructive alveolar hypoventilation: Secondary | ICD-10-CM | POA: Diagnosis not present

## 2016-02-25 DIAGNOSIS — G4710 Hypersomnia, unspecified: Secondary | ICD-10-CM | POA: Diagnosis not present

## 2016-02-25 DIAGNOSIS — Z9989 Dependence on other enabling machines and devices: Principal | ICD-10-CM

## 2016-02-25 DIAGNOSIS — M352 Behcet's disease: Secondary | ICD-10-CM

## 2016-02-25 DIAGNOSIS — G4733 Obstructive sleep apnea (adult) (pediatric): Secondary | ICD-10-CM
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
## 2016-03-10 ENCOUNTER — Observation Stay
Admission: EM | Admit: 2016-03-10 | Discharge: 2016-03-11 | Disposition: A | Discharge status: Home / Self Care | Payer: BC Managed Care – PPO | Attending: Internal Medicine | Admitting: Internal Medicine

## 2016-03-10 ENCOUNTER — Emergency Department: Payer: BC Managed Care – PPO

## 2016-03-10 ENCOUNTER — Encounter: Payer: Self-pay | Admitting: Emergency Medicine

## 2016-03-10 ENCOUNTER — Telehealth: Payer: Self-pay

## 2016-03-10 DIAGNOSIS — K219 Gastro-esophageal reflux disease without esophagitis: Secondary | ICD-10-CM | POA: Insufficient documentation

## 2016-03-10 DIAGNOSIS — Z79899 Other long term (current) drug therapy: Secondary | ICD-10-CM | POA: Diagnosis not present

## 2016-03-10 DIAGNOSIS — R0609 Other forms of dyspnea: Secondary | ICD-10-CM

## 2016-03-10 DIAGNOSIS — G4700 Insomnia, unspecified: Secondary | ICD-10-CM | POA: Insufficient documentation

## 2016-03-10 DIAGNOSIS — I2699 Other pulmonary embolism without acute cor pulmonale: Secondary | ICD-10-CM | POA: Diagnosis not present

## 2016-03-10 DIAGNOSIS — G43109 Migraine with aura, not intractable, without status migrainosus: Secondary | ICD-10-CM | POA: Insufficient documentation

## 2016-03-10 DIAGNOSIS — G4730 Sleep apnea, unspecified: Secondary | ICD-10-CM | POA: Insufficient documentation

## 2016-03-10 DIAGNOSIS — R0602 Shortness of breath: Secondary | ICD-10-CM | POA: Diagnosis present

## 2016-03-10 DIAGNOSIS — I2510 Atherosclerotic heart disease of native coronary artery without angina pectoris: Secondary | ICD-10-CM | POA: Diagnosis not present

## 2016-03-10 DIAGNOSIS — I129 Hypertensive chronic kidney disease with stage 1 through stage 4 chronic kidney disease, or unspecified chronic kidney disease: Secondary | ICD-10-CM | POA: Insufficient documentation

## 2016-03-10 DIAGNOSIS — Z8673 Personal history of transient ischemic attack (TIA), and cerebral infarction without residual deficits: Secondary | ICD-10-CM | POA: Insufficient documentation

## 2016-03-10 DIAGNOSIS — F419 Anxiety disorder, unspecified: Secondary | ICD-10-CM | POA: Diagnosis not present

## 2016-03-10 DIAGNOSIS — G43409 Hemiplegic migraine, not intractable, without status migrainosus: Secondary | ICD-10-CM | POA: Diagnosis not present

## 2016-03-10 DIAGNOSIS — F17210 Nicotine dependence, cigarettes, uncomplicated: Secondary | ICD-10-CM | POA: Diagnosis not present

## 2016-03-10 DIAGNOSIS — N189 Chronic kidney disease, unspecified: Secondary | ICD-10-CM | POA: Diagnosis not present

## 2016-03-10 DIAGNOSIS — F329 Major depressive disorder, single episode, unspecified: Secondary | ICD-10-CM | POA: Diagnosis not present

## 2016-03-10 DIAGNOSIS — Z86711 Personal history of pulmonary embolism: Secondary | ICD-10-CM | POA: Diagnosis present

## 2016-03-10 HISTORY — DX: Systemic involvement of connective tissue, unspecified: M35.9

## 2016-03-10 LAB — URINALYSIS COMPLETE WITH MICROSCOPIC (ARMC ONLY)
Bacteria, UA: NONE SEEN
Bilirubin Urine: NEGATIVE
Glucose, UA: NEGATIVE mg/dL
HGB URINE DIPSTICK: NEGATIVE
Ketones, ur: NEGATIVE mg/dL
Leukocytes, UA: NEGATIVE
NITRITE: NEGATIVE
PROTEIN: NEGATIVE mg/dL
SPECIFIC GRAVITY, URINE: 1.01 (ref 1.005–1.030)
pH: 8 (ref 5.0–8.0)

## 2016-03-10 LAB — COMPREHENSIVE METABOLIC PANEL
ALBUMIN: 4.2 g/dL (ref 3.5–5.0)
ALT: 22 U/L (ref 14–54)
ANION GAP: 7 (ref 5–15)
AST: 25 U/L (ref 15–41)
Alkaline Phosphatase: 57 U/L (ref 38–126)
BUN: 11 mg/dL (ref 6–20)
CHLORIDE: 105 mmol/L (ref 101–111)
CO2: 27 mmol/L (ref 22–32)
Calcium: 8.9 mg/dL (ref 8.9–10.3)
Creatinine, Ser: 0.93 mg/dL (ref 0.44–1.00)
GFR calc Af Amer: >60 mL/min (ref >60)
GFR calc non Af Amer: >60 mL/min (ref >60)
GLUCOSE: 98 mg/dL (ref 65–99)
POTASSIUM: 3.9 mmol/L (ref 3.5–5.1)
SODIUM: 139 mmol/L (ref 135–145)
TOTAL PROTEIN: 7.3 g/dL (ref 6.5–8.1)
Total Bilirubin: 0.5 mg/dL (ref 0.3–1.2)

## 2016-03-10 LAB — CBC
HCT: 39.2 % (ref 35.0–47.0)
Hemoglobin: 13.4 g/dL (ref 12.0–16.0)
MCH: 30.3 pg (ref 26.0–34.0)
MCHC: 34.1 g/dL (ref 32.0–36.0)
MCV: 89 fL (ref 80.0–100.0)
Platelets: 225 K/uL (ref 150–440)
RBC: 4.4 MIL/uL (ref 3.80–5.20)
RDW: 13.1 % (ref 11.5–14.5)
WBC: 5.5 K/uL (ref 3.6–11.0)

## 2016-03-10 LAB — TSH: TSH: 0.958 u[IU]/mL (ref 0.350–4.500)

## 2016-03-10 LAB — TROPONIN I: Troponin I: <0.03 ng/mL (ref <0.03)

## 2016-03-10 LAB — BRAIN NATRIURETIC PEPTIDE: B Natriuretic Peptide: 24 pg/mL (ref 0.0–100.0)

## 2016-03-10 MED ORDER — IPRATROPIUM-ALBUTEROL 0.5-2.5 (3) MG/3ML IN SOLN
3.0000 mL | Freq: Once | RESPIRATORY_TRACT | Status: AC
  Administered 2016-03-10: 3 mL via RESPIRATORY_TRACT
  Filled 2016-03-10: qty 3

## 2016-03-10 MED ORDER — ENOXAPARIN SODIUM 120 MG/0.8ML ~~LOC~~ SOLN
1.0000 mg/kg | Freq: Two times a day (BID) | SUBCUTANEOUS | Status: DC
  Administered 2016-03-10: 115 mg via SUBCUTANEOUS
  Filled 2016-03-10 (×2): qty 0.8

## 2016-03-10 MED ORDER — IOPAMIDOL (ISOVUE-370) INJECTION 76%
75.0000 mL | Freq: Once | INTRAVENOUS | Status: AC | PRN
  Administered 2016-03-10: 75 mL via INTRAVENOUS

## 2016-03-10 NOTE — Telephone Encounter (Signed)
I called pt to discuss ONO results/O2. No answer, left a message asking pt to call me back.

## 2016-03-10 NOTE — ED Provider Notes (Signed)
[facility] Emergency Department Provider Note    First MD Initiated Contact with Patient 03/10/16 2101     (approximate)  I have reviewed the triage vital signs and the nursing notes.   HISTORY  Chief Complaint Shortness of Breath    HPI Shirley Rogers is a 50 y.o. female with multiple comorbidities as described below presents with 1 week of lower extremity swelling and shortness of breath. Denies any fevers. Does have a dry cough. Denies any chest pain. States that she does have worsening orthopnea and wakes up in the morning with bilateral low rib pain. States that she is on her feet all day at work and states that her left lower extremity has been having worsening edema over the past week. Feels it she has gained 30 pounds in the past month. Is not currently on any diuretics. Not currently on any steroids. No recent sick contacts.  She is a social smoker.   Past Medical History:  Diagnosis Date  . Anxiety    on xanax in the past  . ASCVD (arteriosclerotic cardiovascular disease)   . Barrett esophagus   . Carotid atherosclerosis 2003   on ultrasound  . Colon polyp   . Complicated migraine    with facial drooping  . Concussion    1974,1982,1987,2016,2016  . DDD (degenerative disc disease), lumbar    MRI 2008, L3-4, L5-S1, spondylotic changes, multilevel facet joint hypertrophic changes  . Depression    on paxil, lexapro, cymbalta in the past  . Diverticulosis   . Dizziness due to old head injury   . Family history of adverse reaction to anesthesia    father - slow to wake  . Fatty liver 2013   On CT abdomen/pelvis  . Fibrocystic breast disease   . GERD (gastroesophageal reflux disease)   . Hemiplegic migraine    daily  . Hemorrhoids   . Hyperlipidemia   . Hypertension    no meds currently.  . IBS (irritable bowel syndrome)   . Insomnia chronic  . Kidney stone 2013   on CT abdomen/pelvis  . 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 Migraine   . Narrowing of intervertebral disc space 2011   C5-6  . Normal cardiac stress test 2009   EF 66%  . Recurrent sinusitis   . Recurrent UTI    question about IC in the past  . Sleep apnea    CPAP machine broken  . Stroke ([place])    2000 - no deficits  . Umbilical hernia 0000000   on CT abdomen/pelvis  . Varicose veins    right lower leg  . Vascular spasm ([place])   . Vertigo    from concussive disorder.  seeing neuro 03/23/15    Patient Active Problem List   Diagnosis Date Noted  . Facial droop 02/04/2016  . Right hemiparesis ([place]) 02/04/2016  . Hypertension 01/25/2016  . Tobacco use 12/03/2015  . ANA positive 10/19/2015  . Transient alteration of awareness 09/24/2015  . Snoring 06/10/2015  . Sleep apnea 06/10/2015  . 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 Globus sensation 03/30/2015  . Post concussion syndrome 03/13/2015  . Depression 03/13/2015  . Benign hypertensive renal disease   . Barrett esophagus   . Diverticulosis   . Hemiplegic migraine   . Lupus   . Stroke ([place])   .  Fall 05/25/2014    Past Surgical History:  Procedure Laterality Date  . Biopsy Punch Thyroid    . BREAST EXCISIONAL BIOPSY Right 2000   right mass excision - atypical hyperplasia  . BREAST SURGERY Right    atypical hyperplasia  . CHOLECYSTECTOMY N/A 07/24/2015   Procedure: LAPAROSCOPIC CHOLECYSTECTOMY WITH INTRAOPERATIVE CHOLANGIOGRAM;  Surgeon: Leonie Green, MD;  Location: [facility] ORS;  Service: General;  Laterality: N/A;  . COLONOSCOPY W/ BIOPSIES     Removed 5 polyps  . TUBAL LIGATION      Prior to Admission medications   Medication Sig Start Date End Date Taking? Authorizing Provider  albuterol (PROVENTIL HFA;VENTOLIN HFA) 108 (90 Base) MCG/ACT inhaler Inhale 2 puffs into the lungs every 4 (four) hours as needed for wheezing or shortness of breath.     Historical Provider, MD  busPIRone (BUSPAR) 5 MG tablet Take 5-15 mg by mouth 3 (three) times daily as needed.    Historical Provider, MD  citalopram (CELEXA) 40 MG tablet Take 1.5 tablets (60 mg total) by mouth daily. 02/19/16   Megan P Johnson, DO  cyclobenzaprine (FLEXERIL) 10 MG tablet Take 1 tablet (10 mg total) by mouth at bedtime. 02/19/16   Megan P Johnson, DO  diphenhydrAMINE (BENADRYL) 25 mg capsule Take 2 capsules (50 mg total) by mouth every 6 (six) hours as needed. 02/19/16   Megan P Johnson, DO  EPINEPHrine (EPIPEN 2-PAK) 0.3 mg/0.3 mL IJ SOAJ injection Inject 0.3 mLs (0.3 mg total) into the muscle once. 09/24/15   Megan P Johnson, DO  EPINEPHrine 0.3 mg/0.3 mL IJ SOAJ injection  12/14/15   Historical Provider, MD  gabapentin (NEURONTIN) 300 MG capsule Take 900 mg by mouth at bedtime.  07/20/15 07/19/16  Historical Provider, MD  hydrochlorothiazide (HYDRODIURIL) 25 MG tablet Take 1 tablet (25 mg total) by mouth daily. 11/03/15   Megan P Johnson, DO  lidocaine (XYLOCAINE) 2 % solution  01/14/16   Historical Provider, MD  magnesium oxide (MAG-OX) 400 MG tablet Take 400 mg by mouth daily. Reported on 05/14/2015    Historical Provider, MD  metoCLOPramide (REGLAN) 10 MG tablet Take 1 tablet (10 mg total) by mouth 4 (four) times daily -  before meals and at bedtime. 12/14/15   Carrie Mew, MD  omeprazole (PRILOSEC) 40 MG capsule Take 40 mg by mouth daily.    Historical Provider, MD  topiramate (TOPAMAX) 50 MG tablet Take 1 tablet (50 mg total) by mouth 2 (two) times daily. 12/07/15   Penni Bombard, MD  traMADol (ULTRAM) 50 MG tablet Take 1 tablet (50 mg total) by mouth 2 (two) times daily as needed. 02/16/16   Megan P Johnson, DO  traZODone (DESYREL) 100 MG tablet Take 100 mg by mouth at bedtime as needed for sleep.    Historical Provider, MD  verapamil (CALAN-SR) 240 MG CR tablet Take 240 mg by mouth daily.  12/03/15   Historical Provider, MD    Allergies Rocephin [ceftriaxone]; Statins;  Compazine [prochlorperazine]; Doxycycline; Erythromycin; Sulfa antibiotics; Meloxicam; Sulfamethoxazole-trimethoprim; Ace inhibitors; Biaxin [clarithromycin]; Latex; and Morphine and related  Family History  Problem Relation Age of Onset  . Other Mother     IBS  . Hypertension Mother   . Heart disease Father   . Diverticulosis Father   . Migraines Father   . Other Son     Ulcerative Colitis, Polonidal cyst  . Parkinson's disease Maternal Grandmother   . Cancer Maternal Grandmother   . Mental illness Paternal Grandmother   .  Stroke Paternal Grandmother   . Cancer Paternal Grandmother     brain cancer  . Bipolar disorder Paternal Grandmother   . Stroke Maternal Grandfather   . Cancer Maternal Grandfather     Bladder, Prostate/bladder/prostate  . Heart disease Paternal Grandfather   . Heart attack Paternal Grandfather   . Macular degeneration    . Diabetes Brother   . Breast cancer Maternal Aunt     Social History Social History  Substance Use Topics  . Smoking status: Current Every Day Smoker    Packs/day: 0.25    Years: 1.00    Types: Cigarettes    Last attempt to quit: 06/16/2015  . Smokeless tobacco: Never Used     Comment: 1-2 cigarettes approx 3 x/wk  . Alcohol use 0.0 oz/week     Comment: social    Review of Systems Patient denies headaches, rhinorrhea, blurry vision, numbness, shortness of breath, chest pain, edema, cough, abdominal pain, nausea, vomiting, diarrhea, dysuria, fevers, rashes or hallucinations unless otherwise stated above in HPI. ____________________________________________   PHYSICAL EXAM:  VITAL SIGNS: Vitals:   03/10/16 1909  BP: (!) 158/93  Pulse: 77  Resp: 18  Temp: 98.2 F (36.8 C)    Constitutional: Alert and oriented. Well appearing and in no acute distress. Eyes: Conjunctivae are normal. PERRL. EOMI. Head: Atraumatic. Nose: No congestion/rhinnorhea. Mouth/Throat: Mucous membranes are moist.  Oropharynx  non-erythematous. Neck: No stridor. Painless ROM. No cervical spine tenderness to palpation Hematological/Lymphatic/Immunilogical: No cervical lymphadenopathy. Cardiovascular: Normal rate, regular rhythm. Grossly normal heart sounds.  Good peripheral circulation. Respiratory: Normal respiratory effort.  No retractions. Lungs with faint inspiratory crackles in the posterior bases Gastrointestinal: Soft and nontender. No distention. No abdominal bruits. No CVA tenderness. Genitourinary:  Musculoskeletal: No lower extremity tenderness nor edema.  No joint effusions. Neurologic:  Normal speech and language. No gross focal neurologic deficits are appreciated. No gait instability. Skin:  Skin is warm, dry and intact. No rash noted. Psychiatric: Mood and affect are normal. Speech and behavior are normal.  ____________________________________________   LABS (all labs ordered are listed, but only abnormal results are displayed)  Results for orders placed or performed during the hospital encounter of 03/10/16 (from the past 24 hour(s))  CBC     Status: None   Collection Time: 03/10/16  7:12 PM  Result Value Ref Range   WBC 5.5 3.6 - 11.0 K/uL   RBC 4.40 3.80 - 5.20 MIL/uL   Hemoglobin 13.4 12.0 - 16.0 g/dL   HCT 39.2 35.0 - 47.0 %   MCV 89.0 80.0 - 100.0 fL   MCH 30.3 26.0 - 34.0 pg   MCHC 34.1 32.0 - 36.0 g/dL   RDW 13.1 11.5 - 14.5 %   Platelets 225 150 - 440 K/uL  Comprehensive metabolic panel     Status: None   Collection Time: 03/10/16  7:12 PM  Result Value Ref Range   Sodium 139 135 - 145 mmol/L   Potassium 3.9 3.5 - 5.1 mmol/L   Chloride 105 101 - 111 mmol/L   CO2 27 22 - 32 mmol/L   Glucose, Bld 98 65 - 99 mg/dL   BUN 11 6 - 20 mg/dL   Creatinine, Ser 0.93 0.44 - 1.00 mg/dL   Calcium 8.9 8.9 - 10.3 mg/dL   Total Protein 7.3 6.5 - 8.1 g/dL   Albumin 4.2 3.5 - 5.0 g/dL   AST 25 15 - 41 U/L   ALT 22 14 - 54 U/L   Alkaline Phosphatase  57 38 - 126 U/L   Total Bilirubin 0.5 0.3  - 1.2 mg/dL   GFR calc non Af Amer >60 >60 mL/min   GFR calc Af Amer >60 >60 mL/min   Anion gap 7 5 - 15  Troponin I     Status: None   Collection Time: 03/10/16  7:12 PM  Result Value Ref Range   Troponin I <0.03 <0.03 ng/mL  Brain natriuretic peptide     Status: None   Collection Time: 03/10/16  7:12 PM  Result Value Ref Range   B Natriuretic Peptide 24.0 0.0 - 100.0 pg/mL   ____________________________________________  EKG My review and personal interpretation at Time: 19:29   Indication: sob  Rate: 70  Rhythm: nsr Axis: normal Other: normal ekg ____________________________________________  RADIOLOGY  I personally reviewed all radiographic images ordered to evaluate for the above acute complaints and reviewed radiology reports and findings.  These findings were personally discussed with the patient.  Please see medical record for radiology report.________________________________________   PROCEDURES  Procedure(s) performed: none    Critical Care performed: yes CRITICAL CARE Performed by: Merlyn Lot   Total critical care time: 38 minutes  Critical care time was exclusive of separately billable procedures and treating other patients.  Critical care was necessary to treat or prevent imminent or life-threatening deterioration.  Critical care was time spent personally by me on the following activities: development of treatment plan with patient and/or surrogate as well as nursing, discussions with consultants, evaluation of patient's response to treatment, examination of patient, obtaining history from patient or surrogate, ordering and performing treatments and interventions, ordering and review of laboratory studies, ordering and review of radiographic studies, pulse oximetry and re-evaluation of patient's condition.  ____________________________________________   INITIAL IMPRESSION / ASSESSMENT AND PLAN / ED COURSE  Pertinent labs & imaging results that were  available during my care of the patient were reviewed by me and considered in my medical decision making (see chart for details).  DDX: Asthma, copd, CHF, pna, ptx, malignancy, Pe, anemia   AMIYLAH WEIDEMAN is a 50 y.o. who presents to the ED with 1 week of lower extremity edema and shortness of breath. Patient arrives afebrile hemodynamically stable. Less consistent with ACS and she denies any pressure or pain as a normal EKG and troponin. Less consistent with congestive heart failure and she has a normal chest x-ray and BMP is normal. Patient does have some mild edema on the lower extremity with left being greater than the right and given her complaint of shortness of breath do feel CT imaging indicated evaluate for pulmonary embolism will also provide albuterol nebulizer to see if she has any symptom improvement and possible undiagnosed bronchospastic lung disease.  The patient will be placed on continuous pulse oximetry and telemetry for monitoring.  Laboratory evaluation will be sent to evaluate for the above complaints.     Clinical Course  Comment By Time  CT imaging with evidence of pulmonary embolism. Have ordered Lovenox and discussed need for further diagnostics including lower extremity ultrasounds. Patient agreeable to admission to hospital.  Have discussed with the patient and available family all diagnostics and treatments performed thus far and all questions were answered to the best of my ability. The patient demonstrates understanding and agreement with plan. Merlyn Lot, MD 10/12 2307     ____________________________________________   FINAL CLINICAL IMPRESSION(S) / ED DIAGNOSES  Final diagnoses:  Other acute pulmonary embolism without acute cor pulmonale (HCC)  Dyspnea on exertion  NEW MEDICATIONS STARTED DURING THIS VISIT:  New Prescriptions   No medications on file     Note:  This document was prepared using Dragon voice recognition software and may  include unintentional dictation errors.    Merlyn Lot, MD 03/10/16 [phone]

## 2016-03-10 NOTE — ED Triage Notes (Signed)
Pt sent here from [facility] for evaluation of bilat lower leg edema and shortness of breath on exertion. Also co pain between scapula, symptoms started 1 week ago.

## 2016-03-10 NOTE — Progress Notes (Signed)
ANTICOAGULATION CONSULT NOTE - Initial Consult  Pharmacy Consult for [facility] Indication: pulmonary embolus  Allergies  Allergen Reactions  . Rocephin [Ceftriaxone] Anaphylaxis  . Statins Anaphylaxis, Diarrhea and Nausea And Vomiting  . Compazine [Prochlorperazine] Other (See Comments)    akathesia akathesia akathesia  . Doxycycline Nausea And Vomiting  . Erythromycin Itching, Nausea And Vomiting and Rash    Patient states she is allergic to all mycin drugs  . Sulfa Antibiotics Diarrhea and Nausea And Vomiting  . Meloxicam   . Sulfamethoxazole-Trimethoprim Other (See Comments)  . Ace Inhibitors Itching  . Biaxin [Clarithromycin] Itching, Nausea And Vomiting and Rash  . Latex Rash  . Morphine And Related Nausea And Vomiting    Patient Measurements: Height: 5\' 5"  (165.1 cm) Weight: 252 lb (114.3 kg) IBW/kg (Calculated) : 57 Heparin Dosing Weight:   Vital Signs: Temp: 98.2 F (36.8 C) (10/12 1909) Temp Source: Oral (10/12 1909) BP: 125/82 (10/12 2230) Pulse Rate: 73 (10/12 2230)  Labs:  Recent Labs  03/10/16 1912  HGB 13.4  HCT 39.2  PLT 225  CREATININE 0.93  TROPONINI <0.03    Estimated Creatinine Clearance: 90.3 mL/min (by C-G formula based on SCr of 0.93 mg/dL).   Medical History: Past Medical History:  Diagnosis Date  . Anxiety    on xanax in the past  . ASCVD (arteriosclerotic cardiovascular disease)   . Barrett esophagus   . Carotid atherosclerosis 2003   on ultrasound  . Collagen vascular disease ([place])   . Colon polyp   . Complicated migraine    with facial drooping  . Concussion    1974,1982,1987,2016,2016  . DDD (degenerative disc disease), lumbar    MRI 2008, L3-4, L5-S1, spondylotic changes, multilevel facet joint hypertrophic changes  . Depression    on paxil, lexapro, cymbalta in the past  . Diverticulosis   . Dizziness due to old head injury   . Family history of adverse reaction to anesthesia    father - slow to wake  . Fatty liver  2013   On CT abdomen/pelvis  . Fibrocystic breast disease   . GERD (gastroesophageal reflux disease)   . Hemiplegic migraine    daily  . Hemorrhoids   . Hyperlipidemia   . Hypertension    no meds currently.  . IBS (irritable bowel syndrome)   . Insomnia chronic  . Kidney stone 2013   on CT abdomen/pelvis  . 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 Migraine   . Narrowing of intervertebral disc space 2011   C5-6  . Normal cardiac stress test 2009   EF 66%  . Recurrent sinusitis   . Recurrent UTI    question about IC in the past  . Sleep apnea    CPAP machine broken  . Stroke ([place])    2000 - no deficits  . Umbilical hernia 0000000   on CT abdomen/pelvis  . Varicose veins    right lower leg  . Vascular spasm ([place])   . Vertigo    from concussive disorder.  seeing neuro 03/23/15    Medications:  Infusions:    Assessment: 49 yof cc referral from [facility] for b/l LLE and SOB on exertion. PTA list does not indicate AC. Pharmacy consulted to dose LMWH for PE.  Goal of Therapy:  Anti-Xa level 0.6-1 units/ml 4hrs after LMWH dose given Monitor platelets by anticoagulation protocol: Yes   Plan:  Enoxaparin 115 mg subcutaneously BID  Laural Benes, Pharm.D., BCPS Clinical Pharmacist 03/10/2016,11:11 PM

## 2016-03-11 ENCOUNTER — Ambulatory Visit: Payer: BC Managed Care – PPO

## 2016-03-11 ENCOUNTER — Observation Stay: Payer: BC Managed Care – PPO

## 2016-03-11 DIAGNOSIS — Z86711 Personal history of pulmonary embolism: Secondary | ICD-10-CM | POA: Diagnosis present

## 2016-03-11 LAB — PHOSPHORUS: PHOSPHORUS: 2.9 mg/dL (ref 2.5–4.6)

## 2016-03-11 LAB — MAGNESIUM: MAGNESIUM: 1.9 mg/dL (ref 1.7–2.4)

## 2016-03-11 MED ORDER — CYCLOBENZAPRINE HCL 10 MG PO TABS
10.0000 mg | ORAL_TABLET | Freq: Every day | ORAL | Status: DC
  Administered 2016-03-11: 03:00:00 10 mg via ORAL
  Filled 2016-03-11: qty 1

## 2016-03-11 MED ORDER — SENNOSIDES-DOCUSATE SODIUM 8.6-50 MG PO TABS: 1.0000 | ORAL_TABLET | Freq: Every evening | ORAL | Status: DC | PRN

## 2016-03-11 MED ORDER — SODIUM CHLORIDE 0.9% FLUSH
3.0000 mL | Freq: Two times a day (BID) | INTRAVENOUS | Status: DC
Start: 2016-03-11 — End: 2016-03-11
  Administered 2016-03-11: 3 mL via INTRAVENOUS

## 2016-03-11 MED ORDER — TOPIRAMATE 25 MG PO TABS
50.0000 mg | ORAL_TABLET | Freq: Two times a day (BID) | ORAL | Status: DC
  Administered 2016-03-11 (×2): 50 mg via ORAL
  Filled 2016-03-11 (×2): qty 2

## 2016-03-11 MED ORDER — TRAZODONE HCL 100 MG PO TABS
100.0000 mg | ORAL_TABLET | Freq: Every evening | ORAL | Status: DC | PRN
  Administered 2016-03-11: 100 mg via ORAL
  Filled 2016-03-11: qty 1

## 2016-03-11 MED ORDER — DIPHENHYDRAMINE HCL 25 MG PO CAPS
50.0000 mg | ORAL_CAPSULE | Freq: Four times a day (QID) | ORAL | Status: DC | PRN
  Filled 2016-03-11: qty 2

## 2016-03-11 MED ORDER — TRAMADOL HCL 50 MG PO TABS
50.0000 mg | ORAL_TABLET | Freq: Two times a day (BID) | ORAL | Status: DC | PRN
  Administered 2016-03-11: 50 mg via ORAL
  Filled 2016-03-11: qty 1

## 2016-03-11 MED ORDER — SODIUM CHLORIDE 0.9 % IV SOLN
INTRAVENOUS | Status: DC
  Administered 2016-03-11: 02:00:00 via INTRAVENOUS

## 2016-03-11 MED ORDER — APIXABAN 5 MG PO TABS: 5.0000 mg | ORAL_TABLET | Freq: Two times a day (BID) | ORAL | Status: DC

## 2016-03-11 MED ORDER — ACETAMINOPHEN 325 MG PO TABS: 650.0000 mg | ORAL_TABLET | Freq: Four times a day (QID) | ORAL | Status: DC | PRN

## 2016-03-11 MED ORDER — PANTOPRAZOLE SODIUM 40 MG PO TBEC
40.0000 mg | DELAYED_RELEASE_TABLET | Freq: Every day | ORAL | Status: DC
  Administered 2016-03-11: 40 mg via ORAL
  Filled 2016-03-11: qty 1

## 2016-03-11 MED ORDER — MAGNESIUM CITRATE PO SOLN: 1.0000 | Freq: Once | ORAL | Status: DC | PRN

## 2016-03-11 MED ORDER — CITALOPRAM HYDROBROMIDE 20 MG PO TABS
60.0000 mg | ORAL_TABLET | Freq: Every day | ORAL | Status: DC
  Administered 2016-03-11: 08:00:00 60 mg via ORAL
  Filled 2016-03-11: qty 3

## 2016-03-11 MED ORDER — APIXABAN 5 MG PO TABS: 5.0000 mg | ORAL_TABLET | Freq: Two times a day (BID) | ORAL | 0 refills | Status: DC

## 2016-03-11 MED ORDER — ACETAMINOPHEN 650 MG RE SUPP: 650.0000 mg | Freq: Four times a day (QID) | RECTAL | Status: DC | PRN

## 2016-03-11 MED ORDER — BUSPIRONE HCL 5 MG PO TABS
5.0000 mg | ORAL_TABLET | Freq: Three times a day (TID) | ORAL | Status: DC
  Administered 2016-03-11: 5 mg via ORAL
  Filled 2016-03-11: qty 1

## 2016-03-11 MED ORDER — BISACODYL 5 MG PO TBEC: 5.0000 mg | DELAYED_RELEASE_TABLET | Freq: Every day | ORAL | Status: DC | PRN

## 2016-03-11 MED ORDER — IPRATROPIUM-ALBUTEROL 0.5-2.5 (3) MG/3ML IN SOLN: 3.0000 mL | Freq: Four times a day (QID) | RESPIRATORY_TRACT | Status: DC | PRN

## 2016-03-11 MED ORDER — ALBUTEROL SULFATE (2.5 MG/3ML) 0.083% IN NEBU: 3.0000 mL | INHALATION_SOLUTION | RESPIRATORY_TRACT | Status: DC | PRN

## 2016-03-11 MED ORDER — METOCLOPRAMIDE HCL 10 MG PO TABS
10.0000 mg | ORAL_TABLET | Freq: Three times a day (TID) | ORAL | Status: DC
  Administered 2016-03-11 (×2): 10 mg via ORAL
  Filled 2016-03-11 (×2): qty 1

## 2016-03-11 MED ORDER — GABAPENTIN 300 MG PO CAPS
900.0000 mg | ORAL_CAPSULE | Freq: Every day | ORAL | Status: DC
  Administered 2016-03-11: 900 mg via ORAL
  Filled 2016-03-11: qty 3

## 2016-03-11 MED ORDER — APIXABAN 5 MG PO TABS
10.0000 mg | ORAL_TABLET | Freq: Two times a day (BID) | ORAL | Status: DC
  Administered 2016-03-11: 09:00:00 10 mg via ORAL
  Filled 2016-03-11: qty 2

## 2016-03-11 MED ORDER — VERAPAMIL HCL ER 120 MG PO TBCR
240.0000 mg | EXTENDED_RELEASE_TABLET | Freq: Every day | ORAL | Status: DC
  Administered 2016-03-11: 240 mg via ORAL
  Filled 2016-03-11: qty 2

## 2016-03-11 MED ORDER — APIXABAN 5 MG PO TABS: 10.0000 mg | ORAL_TABLET | Freq: Two times a day (BID) | ORAL | 0 refills | Status: DC

## 2016-03-11 MED ORDER — MAGNESIUM OXIDE 400 (241.3 MG) MG PO TABS
400.0000 mg | ORAL_TABLET | Freq: Every day | ORAL | Status: DC
  Administered 2016-03-11: 09:00:00 400 mg via ORAL
  Filled 2016-03-11: qty 1

## 2016-03-11 MED ORDER — ONDANSETRON HCL 4 MG PO TABS: 4.0000 mg | ORAL_TABLET | Freq: Four times a day (QID) | ORAL | Status: DC | PRN

## 2016-03-11 MED ORDER — ONDANSETRON HCL 4 MG/2ML IJ SOLN: 4.0000 mg | Freq: Four times a day (QID) | INTRAMUSCULAR | Status: DC | PRN

## 2016-03-11 MED ORDER — HYDROCHLOROTHIAZIDE 25 MG PO TABS
25.0000 mg | ORAL_TABLET | Freq: Every day | ORAL | Status: DC
  Administered 2016-03-11: 25 mg via ORAL
  Filled 2016-03-11: qty 1

## 2016-03-11 NOTE — Progress Notes (Signed)
[facility] [place] NAME: Shirley Rogers    MR#:  [id number]  DATE OF BIRTH:  [birth year]  SUBJECTIVE:  Patient ok this am  REVIEW OF SYSTEMS:    Review of Systems  Constitutional: Negative.  Negative for chills, fever and malaise/fatigue.  HENT: Negative.  Negative for ear discharge, ear pain, hearing loss, nosebleeds and sore throat.   Eyes: Negative.  Negative for blurred vision and pain.  Respiratory: Negative.  Negative for cough, hemoptysis, shortness of breath and wheezing.   Cardiovascular: Positive for leg swelling. Negative for chest pain and palpitations.  Gastrointestinal: Negative.  Negative for abdominal pain, blood in stool, diarrhea, nausea and vomiting.  Genitourinary: Negative.  Negative for dysuria.  Musculoskeletal: Negative.  Negative for back pain.  Skin: Negative.   Neurological: Negative for dizziness, tremors, speech change, focal weakness, seizures and headaches.  Endo/Heme/Allergies: Negative.  Does not bruise/bleed easily.  Psychiatric/Behavioral: Negative.  Negative for depression, hallucinations and suicidal ideas.    Tolerating Diet: yes      DRUG ALLERGIES:   Allergies  Allergen Reactions  . Rocephin [Ceftriaxone] Anaphylaxis  . Statins Anaphylaxis, Diarrhea and Nausea And Vomiting  . Compazine [Prochlorperazine] Other (See Comments)    akathesia akathesia akathesia  . Doxycycline Nausea And Vomiting  . Erythromycin Itching, Nausea And Vomiting and Rash    Patient states Shirley Rogers is allergic to all mycin drugs  . Sulfa Antibiotics Diarrhea and Nausea And Vomiting  . Meloxicam   . Sulfamethoxazole-Trimethoprim Other (See Comments)  . Ace Inhibitors Itching  . Biaxin [Clarithromycin] Itching, Nausea And Vomiting and Rash  . Latex Rash  . Morphine And Related Nausea And Vomiting    VITALS:  Blood pressure 104/68, pulse 89, temperature 98.1 F (36.7 C), temperature source Oral, resp. rate 18, height  5\' 5"  (1.651 m), weight 118.7 kg (261 lb 9.6 oz), last menstrual period 07/01/2013, SpO2 94 %.  PHYSICAL EXAMINATION:   Physical Exam  Constitutional: Shirley Rogers is oriented to person, place, and time and well-developed, well-nourished, and in no distress. No distress.  HENT:  Head: Normocephalic.  Eyes: No scleral icterus.  Neck: Normal range of motion. Neck supple. No JVD present. No tracheal deviation present.  Cardiovascular: Normal rate, regular rhythm and normal heart sounds.  Exam reveals no gallop and no friction rub.   No murmur heard. Pulmonary/Chest: Effort normal and breath sounds normal. No respiratory distress. Shirley Rogers has no wheezes. Shirley Rogers has no rales. Shirley Rogers exhibits no tenderness.  Abdominal: Soft. Bowel sounds are normal. Shirley Rogers exhibits no distension and no mass. There is no tenderness. There is no rebound and no guarding.  Musculoskeletal: Normal range of motion. Shirley Rogers exhibits no edema.  Neurological: Shirley Rogers is alert and oriented to person, place, and time.  Skin: Skin is warm. No rash noted. No erythema.  Psychiatric: Affect and judgment normal.      LABORATORY PANEL:   CBC  Recent Labs Lab 03/10/16 1912  WBC 5.5  HGB 13.4  HCT 39.2  PLT 225   ------------------------------------------------------------------------------------------------------------------  Chemistries   Recent Labs Lab 03/10/16 1912  NA 139  K 3.9  CL 105  CO2 27  GLUCOSE 98  BUN 11  CREATININE 0.93  CALCIUM 8.9  MG 1.9  AST 25  ALT 22  ALKPHOS 57  BILITOT 0.5   ------------------------------------------------------------------------------------------------------------------  Cardiac Enzymes  Recent Labs Lab 03/10/16 1912  TROPONINI <0.03   ------------------------------------------------------------------------------------------------------------------  RADIOLOGY:  Dg Chest 2 View  Result Date: 03/10/2016  CLINICAL DATA:  Initial evaluation for acute bilateral lower extremity  swelling, shortness of breath. EXAM: CHEST  2 VIEW COMPARISON:  Prior radiograph from 09/18/2015. FINDINGS: The cardiac and mediastinal silhouettes are stable in size and contour, and remain within normal limits. The lungs are normally inflated. No airspace consolidation, pleural effusion, or pulmonary edema is identified. There is no pneumothorax. No acute osseous abnormality identified. IMPRESSION: No radiographic evidence for acute cardiopulmonary abnormality. Electronically Signed   By: Jeannine Boga M.D.   On: 03/10/2016 19:Shirley   Ct Angio Chest Pe W And/or Wo Contrast  Result Date: 03/10/2016 CLINICAL DATA:  50 y/o F; bilateral lower leg edema and shortness of breath on exertion. EXAM: CT ANGIOGRAPHY CHEST WITH CONTRAST TECHNIQUE: Multidetector CT imaging of the chest was performed using the standard protocol during bolus administration of intravenous contrast. Multiplanar CT image reconstructions and MIPs were obtained to evaluate the vascular anatomy. CONTRAST:  75 cc Isovue 370. COMPARISON:  03/10/2016 chest radiograph. FINDINGS: Cardiovascular: Good opacification of pulmonary arteries. Right lower lobe superior segmental pulmonary embolus (series 5 image 125, series 8, image 38, series 7, image 57). RV/LV ratio 0.82. Normal heart size. Coronary artery calcifications and proximal LAD. Mediastinum/Nodes: No enlarged mediastinal, hilar, or axillary lymph nodes. Thyroid gland, trachea, and esophagus demonstrate no significant findings. Lungs/Pleura: Lungs are clear. No pleural effusion or pneumothorax. Upper Abdomen: No acute abnormality. Musculoskeletal: No chest wall abnormality. No acute or significant osseous findings. Review of the MIP images confirms the above findings. IMPRESSION: 1. Right lower lobe superior segmental pulmonary embolus. No evidence for right heart strain. 2. Otherwise unremarkable CT of the chest. These results were called by telephone at the time of interpretation on  03/10/2016 at 10:53 pm to Dr. Merlyn Lot , who verbally acknowledged these results. Electronically Signed   By: Kristine Garbe M.D.   On: 03/10/2016 22:Shirley   US Venous Img Lower Bilateral  Result Date: 03/11/2016 CLINICAL DATA:  Acute shortness of breath. Left lower extremity swelling. Pulmonary embolism. EXAM: BILATERAL LOWER EXTREMITY VENOUS DOPPLER ULTRASOUND TECHNIQUE: Gray-scale sonography with graded compression, as well as color Doppler and duplex ultrasound were performed to evaluate the lower extremity deep venous systems from the level of the common femoral vein and including the common femoral, femoral, profunda femoral, popliteal and calf veins including the posterior tibial, peroneal and gastrocnemius veins when visible. The superficial great saphenous vein was also interrogated. Spectral Doppler was utilized to evaluate flow at rest and with distal augmentation maneuvers in the common femoral, femoral and popliteal veins. COMPARISON:  None. FINDINGS: RIGHT LOWER EXTREMITY Common Femoral Vein: No evidence of thrombus. Normal compressibility, respiratory phasicity and response to augmentation. Saphenofemoral Junction: No evidence of thrombus. Normal compressibility and flow on color Doppler imaging. Profunda Femoral Vein: No evidence of thrombus. Normal compressibility and flow on color Doppler imaging. Femoral Vein: No evidence of thrombus. Normal compressibility, respiratory phasicity and response to augmentation. Popliteal Vein: No evidence of thrombus. Normal compressibility, respiratory phasicity and response to augmentation. Calf Veins: No evidence of thrombus. Normal compressibility and flow on color Doppler imaging. LEFT LOWER EXTREMITY Common Femoral Vein: No evidence of thrombus. Normal compressibility, respiratory phasicity and response to augmentation. Saphenofemoral Junction: No evidence of thrombus. Normal compressibility and flow on color Doppler imaging. Profunda  Femoral Vein: No evidence of thrombus. Normal compressibility and flow on color Doppler imaging. Femoral Vein: No evidence of thrombus. Normal compressibility, respiratory phasicity and response to augmentation. Popliteal Vein: No evidence of thrombus. Normal compressibility, respiratory  phasicity and response to augmentation. Calf Veins: No evidence of thrombus. Normal compressibility and flow on color Doppler imaging. Other Findings:  None. IMPRESSION: No evidence of deep venous thrombosis. Electronically Signed   By: Markus Daft M.D.   On: 03/11/2016 11:05     ASSESSMENT AND PLAN:   50 year old Rogers with a history of sleep apnea on CPAP machine and varicose veins who presents with lower extremity edema and shortness of breath and found to have pulmonary emboli and CT scan. 1. Right lower lobe superior segmental pulmonary emboli: Patient was initially started on Lovenox. Shirley Rogers is transitioned to Eliquis. Side effects, alternatives, risks and benefits were thoroughly discussed with the patient accepts these risks. Patient may benefit after completion of therapy for hypercoagulable workup. LEE dopplers negative for DVT   2. Sleep apnea: Continue CPAP  3. Essential hypertension: Continue HCTZ  4. Depression/anxiety: Continue Celexa and BuSpar.      Management plans discussed with the patient and Shirley Rogers is in agreement.  CODE STATUS: full  TOTAL TIME TAKING CARE OF THIS PATIENT: 30 minutes.     POSSIBLE D/C today, DEPENDING ON CLINICAL CONDITION.   Florita Nitsch M.D on 03/11/2016 at 11:27 AM  Between 7am to 6pm - Pager - [phone] After 6pm go to [email or web address] - password [email or web address]  Office  [phone]  CC: Primary care physician; Park Liter, DO  Note: This dictation was prepared with Dragon dictation along with smaller phrase technology. Any transcriptional errors that result from this process are unintentional.

## 2016-03-11 NOTE — Progress Notes (Signed)
Pt being discharged home at this time, discharge instructions reviewed with pt, states understanding, pt with no complaints, no distress or discomfort noted

## 2016-03-11 NOTE — Progress Notes (Signed)
[facility] responded to an OR for Advanced directive education. Pt stated they have and AD from another state, but wanted to see what it looked like for here. CH provided education and left materials with the PT for review. CH provided the ministry of prayer and education.    03/11/16 1100  Clinical Encounter Type  Visited With Patient  Visit Type Initial;Spiritual support;Other (Comment) (AD education)  Referral From Nurse  Spiritual Encounters  Spiritual Needs Literature;Ritual

## 2016-03-11 NOTE — Discharge Summary (Signed)
[facility] [place] NAME: Shirley Rogers    MR#:  [id number]  DATE OF BIRTH:  Feb 13, 1966  DATE OF ADMISSION:  03/10/2016 ADMITTING PHYSICIAN: Harvie Bridge, DO  DATE OF DISCHARGE: 03/11/2016  PRIMARY CARE PHYSICIAN: Park Liter, DO    ADMISSION DIAGNOSIS:  Dyspnea on exertion [R06.09] Other acute pulmonary embolism without acute cor pulmonale ([place]) [I26.99]  DISCHARGE DIAGNOSIS:  Active Problems:   Pulmonary embolus ([place])   SECONDARY DIAGNOSIS:   Past Medical History:  Diagnosis Date  . Anxiety    on xanax in the past  . ASCVD (arteriosclerotic cardiovascular disease)   . Barrett esophagus   . Carotid atherosclerosis 2003   on ultrasound  . Collagen vascular disease ([place])   . Colon polyp   . Complicated migraine    with facial drooping  . Concussion    1974,1982,1987,2016,2016  . DDD (degenerative disc disease), lumbar    MRI 2008, L3-4, L5-S1, spondylotic changes, multilevel facet joint hypertrophic changes  . Depression    on paxil, lexapro, cymbalta in the past  . Diverticulosis   . Dizziness due to old head injury   . Family history of adverse reaction to anesthesia    father - slow to wake  . Fatty liver 2013   On CT abdomen/pelvis  . Fibrocystic breast disease   . GERD (gastroesophageal reflux disease)   . Hemiplegic migraine    daily  . Hemorrhoids   . Hyperlipidemia   . Hypertension    no meds currently.  . IBS (irritable bowel syndrome)   . Insomnia chronic  . Kidney stone 2013   on CT abdomen/pelvis  . 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 Migraine   . Narrowing of intervertebral disc space 2011   C5-6  . Normal cardiac stress test 2009   EF 66%  . Recurrent sinusitis   . Recurrent UTI    question about IC in the past  . Sleep apnea    CPAP machine broken  . Stroke ([place])    2000 - no deficits  . Umbilical hernia 0000000   on CT  abdomen/pelvis  . Varicose veins    right lower leg  . Vascular spasm ([place])   . Vertigo    from concussive disorder.  seeing neuro 03/23/15    HOSPITAL COURSE:   50 year old female with a history of sleep apnea on CPAP machine and varicose veins who presents with lower extremity edema and shortness of breath and found to have pulmonary emboli and CT scan. For further details please refer the H&P.  1. Right lower lobe superior segmental pulmonary emboli: Patient was initially started on Lovenox. She is transitioned to Eliquis. Side effects, alternatives, risks and benefits were thoroughly discussed with the patient accepts these risks. Patient may benefit after completion of therapy for hypercoagulable workup.  2. Sleep apnea: Continue CPAP  3. Essential hypertension: Continue HCTZ  4. Depression/anxiety: Continue Celexa and BuSpar.  DISCHARGE CONDITIONS AND DIET:   Stable Regular diet  CONSULTS OBTAINED:    DRUG ALLERGIES:   Allergies  Allergen Reactions  . Rocephin [Ceftriaxone] Anaphylaxis  . Statins Anaphylaxis, Diarrhea and Nausea And Vomiting  . Compazine [Prochlorperazine] Other (See Comments)    akathesia akathesia akathesia  . Doxycycline Nausea And Vomiting  . Erythromycin Itching, Nausea And Vomiting and Rash    Patient states she is allergic to all mycin drugs  .  Sulfa Antibiotics Diarrhea and Nausea And Vomiting  . Meloxicam   . Sulfamethoxazole-Trimethoprim Other (See Comments)  . Ace Inhibitors Itching  . Biaxin [Clarithromycin] Itching, Nausea And Vomiting and Rash  . Latex Rash  . Morphine And Related Nausea And Vomiting    DISCHARGE MEDICATIONS:   Current Discharge Medication List    START taking these medications   Details  !! apixaban (ELIQUIS) 5 MG TABS tablet Take 2 tablets (10 mg total) by mouth 2 (two) times daily. Qty: 14 tablet, Refills: 0    !! apixaban (ELIQUIS) 5 MG TABS tablet Take 1 tablet (5 mg total) by mouth 2 (two) times  daily. Qty: 60 tablet, Refills: 0     !! - Potential duplicate medications found. Please discuss with provider.    CONTINUE these medications which have NOT CHANGED   Details  albuterol (PROVENTIL HFA;VENTOLIN HFA) 108 (90 Base) MCG/ACT inhaler Inhale 2 puffs into the lungs every 4 (four) hours as needed for wheezing or shortness of breath.    busPIRone (BUSPAR) 5 MG tablet Take 5-15 mg by mouth 3 (three) times daily as needed.    citalopram (CELEXA) 40 MG tablet Take 1.5 tablets (60 mg total) by mouth daily. Qty: 45 tablet, Refills: 3    cyclobenzaprine (FLEXERIL) 10 MG tablet Take 1 tablet (10 mg total) by mouth at bedtime. Qty: 30 tablet, Refills: 2    diphenhydrAMINE (BENADRYL) 25 mg capsule Take 2 capsules (50 mg total) by mouth every 6 (six) hours as needed. Qty: 120 capsule, Refills: 12    EPINEPHrine (EPIPEN 2-PAK) 0.3 mg/0.3 mL IJ SOAJ injection Inject 0.3 mLs (0.3 mg total) into the muscle once. Qty: 1 Device, Refills: 12    gabapentin (NEURONTIN) 300 MG capsule Take 900 mg by mouth at bedtime.     hydrochlorothiazide (HYDRODIURIL) 25 MG tablet Take 1 tablet (25 mg total) by mouth daily. Qty: 90 tablet, Refills: 1    magnesium oxide (MAG-OX) 400 MG tablet Take 400 mg by mouth daily. Reported on 05/14/2015    metoCLOPramide (REGLAN) 10 MG tablet Take 1 tablet (10 mg total) by mouth 4 (four) times daily -  before meals and at bedtime. Qty: 60 tablet, Refills: 0    omeprazole (PRILOSEC) 40 MG capsule Take 40 mg by mouth daily.    topiramate (TOPAMAX) 50 MG tablet Take 1 tablet (50 mg total) by mouth 2 (two) times daily. Qty: 60 tablet, Refills: 12    traMADol (ULTRAM) 50 MG tablet Take 1 tablet (50 mg total) by mouth 2 (two) times daily as needed. Qty: 56 tablet, Refills: 0    traZODone (DESYREL) 100 MG tablet Take 100 mg by mouth at bedtime as needed for sleep.    verapamil (CALAN-SR) 240 MG CR tablet Take 240 mg by mouth daily.       STOP taking these  medications     lidocaine (XYLOCAINE) 2 % solution               Today   CHIEF COMPLAINT:  Patient doing well this point. Denies chest pain or shortness of breath.   VITAL SIGNS:  Blood pressure 104/68, pulse 89, temperature 98.1 F (36.7 C), temperature source Oral, resp. rate 18, height 5\' 5"  (1.651 m), weight 118.7 kg (261 lb 9.6 oz), last menstrual period 07/01/2013, SpO2 94 %.   REVIEW OF SYSTEMS:  Review of Systems  Constitutional: Negative.  Negative for chills, fever and malaise/fatigue.  HENT: Negative.  Negative for ear discharge, ear pain,  hearing loss, nosebleeds and sore throat.   Eyes: Negative.  Negative for blurred vision and pain.  Respiratory: Negative.  Negative for cough, hemoptysis, shortness of breath and wheezing.   Cardiovascular: Positive for leg swelling. Negative for chest pain and palpitations.  Gastrointestinal: Negative.  Negative for abdominal pain, blood in stool, diarrhea, nausea and vomiting.  Genitourinary: Negative.  Negative for dysuria.  Musculoskeletal: Negative.  Negative for back pain.  Skin: Negative.   Neurological: Negative for dizziness, tremors, speech change, focal weakness, seizures and headaches.  Endo/Heme/Allergies: Negative.  Does not bruise/bleed easily.  Psychiatric/Behavioral: Negative.  Negative for depression, hallucinations and suicidal ideas.     PHYSICAL EXAMINATION:  GENERAL:  50 y.o.-year-old patient lying in the bed with no acute distress.  NECK:  Supple, no jugular venous distention. No thyroid enlargement, no tenderness.  LUNGS: Normal breath sounds bilaterally, no wheezing, rales,rhonchi  No use of accessory muscles of respiration.  CARDIOVASCULAR: S1, S2 normal. No murmurs, rubs, or gallops.  ABDOMEN: Soft, non-tender, non-distended. Bowel sounds present. No organomegaly or mass.  EXTREMITIES 1+ bilateral LEE, NO cyanosis, or clubbing.  PSYCHIATRIC: The patient is alert and oriented x 3.  SKIN: No  obvious rash, lesion, or ulcer.   DATA REVIEW:   CBC  Recent Labs Lab 03/10/16 1912  WBC 5.5  HGB 13.4  HCT 39.2  PLT 225    Chemistries   Recent Labs Lab 03/10/16 1912  NA 139  K 3.9  CL 105  CO2 27  GLUCOSE 98  BUN 11  CREATININE 0.93  CALCIUM 8.9  MG 1.9  AST 25  ALT 22  ALKPHOS 57  BILITOT 0.5    Cardiac Enzymes  Recent Labs Lab 03/10/16 1912  TROPONINI <0.03    Microbiology Results  @[email or web address] @  RADIOLOGY:  Dg Chest 2 View  Result Date: 03/10/2016 CLINICAL DATA:  Initial evaluation for acute bilateral lower extremity swelling, shortness of breath. EXAM: CHEST  2 VIEW COMPARISON:  Prior radiograph from 09/18/2015. FINDINGS: The cardiac and mediastinal silhouettes are stable in size and contour, and remain within normal limits. The lungs are normally inflated. No airspace consolidation, pleural effusion, or pulmonary edema is identified. There is no pneumothorax. No acute osseous abnormality identified. IMPRESSION: No radiographic evidence for acute cardiopulmonary abnormality. Electronically Signed   By: Jeannine Boga M.D.   On: 03/10/2016 19:55   Ct Angio Chest Pe W And/or Wo Contrast  Result Date: 03/10/2016 CLINICAL DATA:  50 y/o F; bilateral lower leg edema and shortness of breath on exertion. EXAM: CT ANGIOGRAPHY CHEST WITH CONTRAST TECHNIQUE: Multidetector CT imaging of the chest was performed using the standard protocol during bolus administration of intravenous contrast. Multiplanar CT image reconstructions and MIPs were obtained to evaluate the vascular anatomy. CONTRAST:  75 cc Isovue 370. COMPARISON:  03/10/2016 chest radiograph. FINDINGS: Cardiovascular: Good opacification of pulmonary arteries. Right lower lobe superior segmental pulmonary embolus (series 5 image 125, series 8, image 38, series 7, image 57). RV/LV ratio 0.82. Normal heart size. Coronary artery calcifications and proximal LAD. Mediastinum/Nodes: No enlarged mediastinal,  hilar, or axillary lymph nodes. Thyroid gland, trachea, and esophagus demonstrate no significant findings. Lungs/Pleura: Lungs are clear. No pleural effusion or pneumothorax. Upper Abdomen: No acute abnormality. Musculoskeletal: No chest wall abnormality. No acute or significant osseous findings. Review of the MIP images confirms the above findings. IMPRESSION: 1. Right lower lobe superior segmental pulmonary embolus. No evidence for right heart strain. 2. Otherwise unremarkable CT of the chest. These results were called  by telephone at the time of interpretation on 03/10/2016 at 10:53 pm to Dr. Merlyn Lot , who verbally acknowledged these results. Electronically Signed   By: Kristine Garbe M.D.   On: 03/10/2016 22:55      Management plans discussed with the patient and she is in agreement. Stable for discharge home Rounding with nursing staff  Patient should follow up with PCP  CODE STATUS:     Code Status Orders        Start     Ordered   03/11/16 0132  Full code  Continuous     03/11/16 0131    Code Status History    Date Active Date Inactive Code Status Order ID Comments User Context   02/04/2016 10:48 PM 02/05/2016  1:23 PM Full Code [id number]  Lance Coon, MD Inpatient    Advance Directive Documentation   Flowsheet Row Most Recent Value  Type of Advance Directive  Healthcare Power of Attorney  Pre-existing out of facility DNR order (yellow form or pink MOST form)  No data  "MOST" Form in Place?  No data      TOTAL TIME TAKING CARE OF THIS PATIENT: 36 minutes.    Note: This dictation was prepared with Dragon dictation along with smaller phrase technology. Any transcriptional errors that result from this process are unintentional.  Jamon Hayhurst M.D on 03/11/2016 at 10:24 AM  Between 7am to 6pm - Pager - [phone] After 6pm go to [email or web address] - Proofreader  [facility]  Office  [phone]  CC: Primary care physician; Park Liter, DO

## 2016-03-11 NOTE — H&P (Signed)
[place] @ [facility] Admission History and Physical [employer], D.O.  ---------------------------------------------------------------------------------------------------------------------   PATIENT NAME: Shirley Rogers MR#: [id number] DATE OF BIRTH: 10/10/1965 DATE OF ADMISSION: 03/10/2016 PRIMARY CARE PHYSICIAN: Park Liter, DO  REQUESTING/REFERRING PHYSICIAN: ED Dr. Quentin Cornwall  CHIEF COMPLAINT: Chief Complaint  Patient presents with  . Shortness of Breath    HISTORY OF PRESENT ILLNESS: Shirley Rogers is a 50 y.o. female with a known history of multiple medical problems presents to the emergency department complaining of one week of lower extremity swelling, cough and shortness of breath with dyspnea on exertion. Denies any chest pain but has had lower rib pain associated with coughing.  She reports lower extremity edema worse at the end of the day.    Patient states that she has persistent dyspnea on exertion and is concerned about going home because she has severe reactions to medications and she lives alone.  Otherwise there has been no change in status. Patient has been taking medication as prescribed and there has been no recent change in medication or diet.  There has been no recent illness, travel or sick contacts.    Patient denies fevers/chills, weakness, dizziness, chest pain, shortness of breath, N/V/C/D, abdominal pain, dysuria/frequency, changes in mental status.   EMS/ED COURSE:   Patient received Lovenox 115mg . .  PAST MEDICAL HISTORY: Past Medical History:  Diagnosis Date  . Anxiety    on xanax in the past  . ASCVD (arteriosclerotic cardiovascular disease)   . Barrett esophagus   . Carotid atherosclerosis 2003   on ultrasound  . Collagen vascular disease ([place])   . Colon polyp   . Complicated migraine    with facial drooping  . Concussion    1974,1982,1987,2016,2016  . DDD (degenerative disc disease), lumbar    MRI 2008, L3-4, L5-S1,  spondylotic changes, multilevel facet joint hypertrophic changes  . Depression    on paxil, lexapro, cymbalta in the past  . Diverticulosis   . Dizziness due to old head injury   . Family history of adverse reaction to anesthesia    father - slow to wake  . Fatty liver 2013   On CT abdomen/pelvis  . Fibrocystic breast disease   . GERD (gastroesophageal reflux disease)   . Hemiplegic migraine    daily  . Hemorrhoids   . Hyperlipidemia   . Hypertension    no meds currently.  . IBS (irritable bowel syndrome)   . Insomnia chronic  . Kidney stone 2013   on CT abdomen/pelvis  . 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 Migraine   . Narrowing of intervertebral disc space 2011   C5-6  . Normal cardiac stress test 2009   EF 66%  . Recurrent sinusitis   . Recurrent UTI    question about IC in the past  . Sleep apnea    CPAP machine broken  . Stroke ([place])    2000 - no deficits  . Umbilical hernia 0000000   on CT abdomen/pelvis  . Varicose veins    right lower leg  . Vascular spasm ([place])   . Vertigo    from concussive disorder.  seeing neuro 03/23/15      PAST SURGICAL HISTORY: Past Surgical History:  Procedure Laterality Date  . Biopsy Punch Thyroid    . BREAST EXCISIONAL BIOPSY Right 2000   right mass excision - atypical hyperplasia  . BREAST SURGERY Right  atypical hyperplasia  . CHOLECYSTECTOMY N/A 07/24/2015   Procedure: LAPAROSCOPIC CHOLECYSTECTOMY WITH INTRAOPERATIVE CHOLANGIOGRAM;  Surgeon: Leonie Green, MD;  Location: [facility] ORS;  Service: General;  Laterality: N/A;  . COLONOSCOPY W/ BIOPSIES     Removed 5 polyps  . TUBAL LIGATION        SOCIAL HISTORY: Social History  Substance Use Topics  . Smoking status: Current Every Day Smoker    Packs/day: 0.25    Years: 1.00    Types: Cigarettes    Last attempt to quit: 06/16/2015  . Smokeless tobacco: Never Used     Comment: 1-2 cigarettes approx 3  x/wk  . Alcohol use 0.0 oz/week     Comment: social      FAMILY HISTORY: Family History  Problem Relation Age of Onset  . Other Mother     IBS  . Hypertension Mother   . Heart disease Father   . Diverticulosis Father   . Migraines Father   . Other Son     Ulcerative Colitis, Polonidal cyst  . Parkinson's disease Maternal Grandmother   . Cancer Maternal Grandmother   . Mental illness Paternal Grandmother   . Stroke Paternal Grandmother   . Cancer Paternal Grandmother     brain cancer  . Bipolar disorder Paternal Grandmother   . Stroke Maternal Grandfather   . Cancer Maternal Grandfather     Bladder, Prostate/bladder/prostate  . Heart disease Paternal Grandfather   . Heart attack Paternal Grandfather   . Macular degeneration    . Diabetes Brother   . Breast cancer Maternal Aunt      MEDICATIONS AT HOME: Prior to Admission medications   Medication Sig Start Date End Date Taking? Authorizing Provider  albuterol (PROVENTIL HFA;VENTOLIN HFA) 108 (90 Base) MCG/ACT inhaler Inhale 2 puffs into the lungs every 4 (four) hours as needed for wheezing or shortness of breath.   Yes Historical Provider, MD  busPIRone (BUSPAR) 5 MG tablet Take 5-15 mg by mouth 3 (three) times daily as needed.   Yes Historical Provider, MD  citalopram (CELEXA) 40 MG tablet Take 1.5 tablets (60 mg total) by mouth daily. 02/19/16  Yes Megan P Johnson, DO  cyclobenzaprine (FLEXERIL) 10 MG tablet Take 1 tablet (10 mg total) by mouth at bedtime. 02/19/16  Yes Megan P Johnson, DO  diphenhydrAMINE (BENADRYL) 25 mg capsule Take 2 capsules (50 mg total) by mouth every 6 (six) hours as needed. 02/19/16  Yes Megan P Johnson, DO  EPINEPHrine (EPIPEN 2-PAK) 0.3 mg/0.3 mL IJ SOAJ injection Inject 0.3 mLs (0.3 mg total) into the muscle once. 09/24/15  Yes Megan P Johnson, DO  gabapentin (NEURONTIN) 300 MG capsule Take 900 mg by mouth at bedtime.  07/20/15 07/19/16 Yes Historical Provider, MD  hydrochlorothiazide (HYDRODIURIL)  25 MG tablet Take 1 tablet (25 mg total) by mouth daily. 11/03/15  Yes Megan P Johnson, DO  lidocaine (XYLOCAINE) 2 % solution  01/14/16  Yes Historical Provider, MD  magnesium oxide (MAG-OX) 400 MG tablet Take 400 mg by mouth daily. Reported on 05/14/2015   Yes Historical Provider, MD  metoCLOPramide (REGLAN) 10 MG tablet Take 1 tablet (10 mg total) by mouth 4 (four) times daily -  before meals and at bedtime. 12/14/15  Yes Carrie Mew, MD  omeprazole (PRILOSEC) 40 MG capsule Take 40 mg by mouth daily.   Yes Historical Provider, MD  topiramate (TOPAMAX) 50 MG tablet Take 1 tablet (50 mg total) by mouth 2 (two) times daily. 12/07/15  Yes Vikram R  Penumalli, MD  traMADol (ULTRAM) 50 MG tablet Take 1 tablet (50 mg total) by mouth 2 (two) times daily as needed. 02/16/16  Yes Megan P Johnson, DO  traZODone (DESYREL) 100 MG tablet Take 100 mg by mouth at bedtime as needed for sleep.   Yes Historical Provider, MD  verapamil (CALAN-SR) 240 MG CR tablet Take 240 mg by mouth daily.  12/03/15  Yes Historical Provider, MD  EPINEPHrine 0.3 mg/0.3 mL IJ SOAJ injection  12/14/15   Historical Provider, MD      DRUG ALLERGIES: Allergies  Allergen Reactions  . Rocephin [Ceftriaxone] Anaphylaxis  . Statins Anaphylaxis, Diarrhea and Nausea And Vomiting  . Compazine [Prochlorperazine] Other (See Comments)    akathesia akathesia akathesia  . Doxycycline Nausea And Vomiting  . Erythromycin Itching, Nausea And Vomiting and Rash    Patient states she is allergic to all mycin drugs  . Sulfa Antibiotics Diarrhea and Nausea And Vomiting  . Meloxicam   . Sulfamethoxazole-Trimethoprim Other (See Comments)  . Ace Inhibitors Itching  . Biaxin [Clarithromycin] Itching, Nausea And Vomiting and Rash  . Latex Rash  . Morphine And Related Nausea And Vomiting     REVIEW OF SYSTEMS: CONSTITUTIONAL: No fatigue, weakness, fever, chills, weight gain/loss, headache EYES: No blurry or double vision. ENT: No tinnitus,  postnasal drip, redness or soreness of the oropharynx. RESPIRATORY: No dyspnea, cough, wheeze, hemoptysis. CARDIOVASCULAR: Positive chest pain, Negative orthopnea, palpitations, syncope. GASTROINTESTINAL: No nausea, vomiting, constipation, diarrhea, abdominal pain. No hematemesis, melena or hematochezia. GENITOURINARY: No dysuria, frequency, hematuria. ENDOCRINE: No polyuria or nocturia. No heat or cold intolerance. HEMATOLOGY: No anemia, bruising, bleeding. INTEGUMENTARY: No rashes, ulcers, lesions. MUSCULOSKELETAL: No pain, arthritis, positive bilateral lower extremity swelling NEUROLOGIC: No numbness, tingling, weakness or ataxia. No seizure-type activity. PSYCHIATRIC: No anxiety, depression, insomnia.  PHYSICAL EXAMINATION: VITAL SIGNS: Blood pressure 119/77, pulse 80, temperature 98.2 F (36.8 C), temperature source Oral, resp. rate 18, height 5\' 5"  (1.651 m), weight 114.3 kg (252 lb), last menstrual period 07/01/2013, SpO2 95 %.  GENERAL: 50 y.o.-year-old white female patient, well-developed, well-nourished lying in the bed in no acute distress.  Pleasant and cooperative.   HEENT: Head atraumatic, normocephalic. Pupils equal, round, reactive to light and accommodation. No scleral icterus. Extraocular muscles intact. Oropharynx is clear. Mucus membranes moist. NECK: Supple, full range of motion. No JVD, no bruit heard. No cervical lymphadenopathy. CHEST: Normal breath sounds bilaterally. No wheezing, rales, rhonchi or crackles. No use of accessory muscles of respiration.  No reproducible chest wall tenderness.  CARDIOVASCULAR: S1, S2 normal. No murmurs, rubs, or gallops appreciated. Cap refill <2 seconds. ABDOMEN: Soft, nontender, nondistended. No rebound, guarding, rigidity. Normoactive bowel sounds present in all four quadrants. No organomegaly or mass. EXTREMITIES: Full range of motion. No pedal edema, cyanosis, or clubbing. Negative Homan, negative calf tenderness NEUROLOGIC: Cranial  nerves II through XII are grossly intact with no focal sensorimotor deficit. Muscle strength 5/5 in all extremities. Sensation intact. Gait not checked. PSYCHIATRIC: The patient is alert and oriented x 3. Normal affect, mood, thought content. SKIN: Warm, dry, and intact without obvious rash, lesion, or ulcer.  LABORATORY PANEL:  CBC  Recent Labs Lab 03/10/16 1912  WBC 5.5  HGB 13.4  HCT 39.2  PLT 225   ----------------------------------------------------------------------------------------------------------------- Chemistries  Recent Labs Lab 03/10/16 1912  NA 139  K 3.9  CL 105  CO2 27  GLUCOSE 98  BUN 11  CREATININE 0.93  CALCIUM 8.9  AST 25  ALT 22  ALKPHOS 57  BILITOT 0.5   ------------------------------------------------------------------------------------------------------------------ Cardiac Enzymes  Recent Labs Lab 03/10/16 1912  TROPONINI <0.03   ------------------------------------------------------------------------------------------------------------------  RADIOLOGY: Dg Chest 2 View  Result Date: 03/10/2016 CLINICAL DATA:  Initial evaluation for acute bilateral lower extremity swelling, shortness of breath. EXAM: CHEST  2 VIEW COMPARISON:  Prior radiograph from 09/18/2015. FINDINGS: The cardiac and mediastinal silhouettes are stable in size and contour, and remain within normal limits. The lungs are normally inflated. No airspace consolidation, pleural effusion, or pulmonary edema is identified. There is no pneumothorax. No acute osseous abnormality identified. IMPRESSION: No radiographic evidence for acute cardiopulmonary abnormality. Electronically Signed   By: Jeannine Boga M.D.   On: 03/10/2016 19:55   Ct Angio Chest Pe W And/or Wo Contrast  Result Date: 03/10/2016 CLINICAL DATA:  50 y/o F; bilateral lower leg edema and shortness of breath on exertion. EXAM: CT ANGIOGRAPHY CHEST WITH CONTRAST TECHNIQUE: Multidetector CT imaging of the  chest was performed using the standard protocol during bolus administration of intravenous contrast. Multiplanar CT image reconstructions and MIPs were obtained to evaluate the vascular anatomy. CONTRAST:  75 cc Isovue 370. COMPARISON:  03/10/2016 chest radiograph. FINDINGS: Cardiovascular: Good opacification of pulmonary arteries. Right lower lobe superior segmental pulmonary embolus (series 5 image 125, series 8, image 38, series 7, image 57). RV/LV ratio 0.82. Normal heart size. Coronary artery calcifications and proximal LAD. Mediastinum/Nodes: No enlarged mediastinal, hilar, or axillary lymph nodes. Thyroid gland, trachea, and esophagus demonstrate no significant findings. Lungs/Pleura: Lungs are clear. No pleural effusion or pneumothorax. Upper Abdomen: No acute abnormality. Musculoskeletal: No chest wall abnormality. No acute or significant osseous findings. Review of the MIP images confirms the above findings. IMPRESSION: 1. Right lower lobe superior segmental pulmonary embolus. No evidence for right heart strain. 2. Otherwise unremarkable CT of the chest. These results were called by telephone at the time of interpretation on 03/10/2016 at 10:53 pm to Dr. Merlyn Lot , who verbally acknowledged these results. Electronically Signed   By: Kristine Garbe M.D.   On: 03/10/2016 22:55    EKG: Normal sinus rhythm at 70 bpm with normal axis and nonspecific ST-T wave changes.   IMPRESSION AND PLAN:  This is a 50 y.o. female with a history of multiple medical problems now being admitted with: 1. Pulmonary embolus with dyspnea on exertion- admit to observation, tele monitoring, O2, DuoNeb's weight based Lovenox, bilateral lower Dopplers. We'll need to pursue hypercoagulability workup upon completion of her course of anticoagulation. 2. History of anxiety and depression-continue BuSpar, Celexa 3. History of insomnia-continue trazodone 4. History of hypertension-continue verapamil,  hydrochlorothiazide 5. History of pain-continue Flexeril, Neurontin, Ultram 6. History of GERD-continue Reglan, Prilosec   Diet/Nutrition: Heart healthy Fluids: IVNS DVT Px: Lovenox, SCDs and early ambulation Code Status: Full  All the records are reviewed and case discussed with ED provider. Management plans discussed with the patient and/or family who express understanding and agree with plan of care.   TOTAL TIME TAKING CARE OF THIS PATIENT: 60 minutes.   Dashonna Chagnon D.O. on 03/11/2016 at 12:33 AM Between 7am to 6pm - Pager - [phone] After 6pm go to [email or web address] - Proofreader [facility] [place] Office [phone] CC: Primary care physician; Park Liter, DO     Note: This dictation was prepared with Dragon dictation along with smaller phrase technology. Any transcriptional errors that result from this process are unintentional.

## 2016-03-11 NOTE — Progress Notes (Signed)
ANTICOAGULATION CONSULT NOTE - Initial Consult  Pharmacy Consult for apixaban Indication: pulmonary embolus  Allergies  Allergen Reactions  . Rocephin [Ceftriaxone] Anaphylaxis  . Statins Anaphylaxis, Diarrhea and Nausea And Vomiting  . Compazine [Prochlorperazine] Other (See Comments)    akathesia akathesia akathesia  . Doxycycline Nausea And Vomiting  . Erythromycin Itching, Nausea And Vomiting and Rash    Patient states she is allergic to all mycin drugs  . Sulfa Antibiotics Diarrhea and Nausea And Vomiting  . Meloxicam   . Sulfamethoxazole-Trimethoprim Other (See Comments)  . Ace Inhibitors Itching  . Biaxin [Clarithromycin] Itching, Nausea And Vomiting and Rash  . Latex Rash  . Morphine And Related Nausea And Vomiting    Patient Measurements: Height: 5\' 5"  (165.1 cm) Weight: 261 lb 9.6 oz (118.7 kg) IBW/kg (Calculated) : 57  Vital Signs: Temp: 98.1 F (36.7 C) (10/13 0440) Temp Source: Oral (10/13 0440) BP: 104/68 (10/13 0440) Pulse Rate: 89 (10/13 0440)  Labs:  Recent Labs  03/10/16 1912  HGB 13.4  HCT 39.2  PLT 225  CREATININE 0.93  TROPONINI <0.03    Estimated Creatinine Clearance: 92.3 mL/min (by C-G formula based on SCr of 0.93 mg/dL).   Medical History: Past Medical History:  Diagnosis Date  . Anxiety    on xanax in the past  . ASCVD (arteriosclerotic cardiovascular disease)   . Barrett esophagus   . Carotid atherosclerosis 2003   on ultrasound  . Collagen vascular disease ([place])   . Colon polyp   . Complicated migraine    with facial drooping  . Concussion    1974,1982,1987,2016,2016  . DDD (degenerative disc disease), lumbar    MRI 2008, L3-4, L5-S1, spondylotic changes, multilevel facet joint hypertrophic changes  . Depression    on paxil, lexapro, cymbalta in the past  . Diverticulosis   . Dizziness due to old head injury   . Family history of adverse reaction to anesthesia    father - slow to wake  . Fatty liver 2013   On CT  abdomen/pelvis  . Fibrocystic breast disease   . GERD (gastroesophageal reflux disease)   . Hemiplegic migraine    daily  . Hemorrhoids   . Hyperlipidemia   . Hypertension    no meds currently.  . IBS (irritable bowel syndrome)   . Insomnia chronic  . Kidney stone 2013   on CT abdomen/pelvis  . 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 Migraine   . Narrowing of intervertebral disc space 2011   C5-6  . Normal cardiac stress test 2009   EF 66%  . Recurrent sinusitis   . Recurrent UTI    question about IC in the past  . Sleep apnea    CPAP machine broken  . Stroke ([place])    2000 - no deficits  . Umbilical hernia 0000000   on CT abdomen/pelvis  . Varicose veins    right lower leg  . Vascular spasm ([place])   . Vertigo    from concussive disorder.  seeing neuro 03/23/15     Assessment: 50 year old female with PE on enoxaparin to transition to apixaban  Plan:  Received enoxaparin 115mg  x 1 at ~2300 last night, will start apixaban at 10:00 this morning. Apixaban 10mg  PO BID x 7 days followed by apixaban 5mg  PO BID.  Lavonya Hoerner C 03/11/2016,7:53 AM

## 2016-03-11 NOTE — Progress Notes (Signed)
[place] at [facility] was admitted to the [facility] on 03/10/2016 and Discharged  03/11/2016 and should be excused from work/school   for 4  days starting 03/10/2016 , may return to work/school without any restrictions.  Call Bettey Costa MD with questions.  Roth Ress M.D on 03/11/2016,at 8:51 AM  [place] at [facility]  [phone]

## 2016-03-11 NOTE — Discharge Instructions (Addendum)
Information on my medicine - ELIQUIS (apixaban)  This medication education was reviewed with me or my healthcare representative as part of my discharge preparation.  The pharmacist that spoke with me during my hospital stay was:  Cheri Guppy, [facility]  Why was Eliquis prescribed for you? Eliquis was prescribed to treat blood clots that may have been found in the veins of your legs (deep vein thrombosis) or in your lungs (pulmonary embolism) and to reduce the risk of them occurring again.  What do You need to know about Eliquis ? The starting dose is 10 mg (two 5 mg tablets) taken TWICE daily for the FIRST SEVEN (7) DAYS, then on (enter date)  October 20th  the dose is reduced to ONE 5 mg tablet taken TWICE daily.  Eliquis may be taken with or without food.   Try to take the dose about the same time in the morning and in the evening. If you have difficulty swallowing the tablet whole please discuss with your pharmacist how to take the medication safely.  Take Eliquis exactly as prescribed and DO NOT stop taking Eliquis without talking to the doctor who prescribed the medication.  Stopping may increase your risk of developing a new blood clot.  Refill your prescription before you run out.  After discharge, you should have regular check-up appointments with your healthcare provider that is prescribing your Eliquis.    What do you do if you miss a dose? If a dose of ELIQUIS is not taken at the scheduled time, take it as soon as possible on the same day and twice-daily administration should be resumed. The dose should not be doubled to make up for a missed dose.  Important Safety Information A possible side effect of Eliquis is bleeding. You should call your healthcare provider right away if you experience any of the following: ? Bleeding from an injury or your nose that does not stop. ? Unusual colored urine (red or dark brown) or unusual colored stools (red or black). ? Unusual bruising  for unknown reasons. ? A serious fall or if you hit your head (even if there is no bleeding).  Some medicines may interact with Eliquis and might increase your risk of bleeding or clotting while on Eliquis. To help avoid this, consult your healthcare provider or pharmacist prior to using any new prescription or non-prescription medications, including herbals, vitamins, non-steroidal anti-inflammatory drugs (NSAIDs) and supplements.  This website has more information on Eliquis (apixaban): http://www.eliquis.com/eliquis/home

## 2016-03-14 NOTE — Telephone Encounter (Signed)
I called pt again to discuss ONO results. No answer, left a message asking her to call me back.

## 2016-03-15 ENCOUNTER — Encounter: Payer: Self-pay | Admitting: Urology

## 2016-03-15 ENCOUNTER — Ambulatory Visit (INDEPENDENT_AMBULATORY_CARE_PROVIDER_SITE_OTHER): Payer: BC Managed Care – PPO | Admitting: Urology

## 2016-03-15 VITALS — BP 118/79 | HR 83 | Ht 65.0 in | Wt 259.2 lb

## 2016-03-15 DIAGNOSIS — M6289 Other specified disorders of muscle: Secondary | ICD-10-CM | POA: Diagnosis not present

## 2016-03-15 DIAGNOSIS — N362 Urethral caruncle: Secondary | ICD-10-CM | POA: Diagnosis not present

## 2016-03-15 DIAGNOSIS — N3000 Acute cystitis without hematuria: Secondary | ICD-10-CM

## 2016-03-15 DIAGNOSIS — R3129 Other microscopic hematuria: Secondary | ICD-10-CM | POA: Diagnosis not present

## 2016-03-15 LAB — URINALYSIS, COMPLETE
Bilirubin, UA: NEGATIVE
GLUCOSE, UA: NEGATIVE
Ketones, UA: NEGATIVE
Leukocytes, UA: NEGATIVE
Nitrite, UA: NEGATIVE
PROTEIN UA: NEGATIVE
Specific Gravity, UA: 1.02 (ref 1.005–1.030)
Urobilinogen, Ur: 1 mg/dL (ref 0.2–1.0)
pH, UA: 7 (ref 5.0–7.5)

## 2016-03-15 LAB — MICROSCOPIC EXAMINATION: RBC, UA: >30 /hpf — AB (ref 0–?)

## 2016-03-15 LAB — BLADDER SCAN AMB NON-IMAGING: Scan Result: 0

## 2016-03-15 MED ORDER — IBUPROFEN 800 MG PO TABS: 800.0000 mg | ORAL_TABLET | Freq: Three times a day (TID) | ORAL | 0 refills | Status: DC

## 2016-03-15 MED ORDER — DIAZEPAM 5 MG PO TABS: 5.0000 mg | ORAL_TABLET | Freq: Four times a day (QID) | ORAL | 0 refills | Status: DC | PRN

## 2016-03-15 NOTE — Progress Notes (Signed)
03/15/2016 3:20 PM   Shirley Rogers [birth year] [id number]  Referring provider: Valerie Roys, DO [place], [place] [address]  Chief Complaint  Patient presents with  . 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 She has a hx of Bechets syndrome. She has >30 RBCs/hpf on today's UA. UA from 3 days ago showed no RBCs.  She had a hx of nephrolithiasis in 2015.  She had intermittent, sharp, moderate right flank pain radiating to her groin which has been present for 3 days.  She has had urgency, a feeling of incomplete emptying, straining, hesitancy, intermittency.  She has occasional dysuria.  Stress makes her LUTS worse.  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   She has constipation and has a BM every 5 days.  She has had dyspareunia for over 30 years.   She was diagnosed with interstitial cystitis in 1999 by a urologist in [place]. She had DMSO instillations which did not improve her symptoms. She had a hydrodistension in the office.      PMH: Past Medical History:  Diagnosis Date  . Anxiety    on xanax in the past  . ASCVD (arteriosclerotic cardiovascular disease)   . Barrett esophagus   . 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 Carotid atherosclerosis 2003   on ultrasound  . Collagen vascular disease ([place])   . Colon polyp   . Complicated migraine    with facial drooping  . Concussion    1974,1982,1987,2016,2016  . DDD (degenerative disc disease), lumbar    MRI 2008, L3-4, L5-S1, spondylotic changes, multilevel facet joint hypertrophic changes  . Depression    on paxil, lexapro, cymbalta in the past  . Diverticulosis   . Dizziness due to old head injury   . Family history of adverse reaction to anesthesia    father - slow to wake  . Fatty liver 2013   On CT abdomen/pelvis  . Fibrocystic breast disease   . GERD (gastroesophageal reflux disease)   . Hemiplegic migraine     daily  . Hemorrhoids   . Hyperlipidemia   . Hypertension    no meds currently.  . IBS (irritable bowel syndrome)   . Insomnia chronic  . Kidney stone 2013   on CT abdomen/pelvis  . 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 Migraine   . Narrowing of intervertebral disc space 2011   C5-6  . Normal cardiac stress test 2009   EF 66%  . Recurrent sinusitis   . Recurrent UTI    question about IC in the past  . Sleep apnea    CPAP machine broken  . Stroke ([place])    2000 - no deficits  . Umbilical hernia 0000000   on CT abdomen/pelvis  . Varicose veins    right lower leg  . Vascular spasm ([place])   . Vertigo    from concussive disorder.  seeing neuro 03/23/15    Surgical History: Past Surgical History:  Procedure Laterality Date  . Biopsy Punch Thyroid    . BREAST EXCISIONAL BIOPSY Right 2000   right mass excision - atypical hyperplasia  . BREAST SURGERY Right    atypical hyperplasia  . CHOLECYSTECTOMY N/A 07/24/2015   Procedure: LAPAROSCOPIC CHOLECYSTECTOMY WITH INTRAOPERATIVE CHOLANGIOGRAM;  Surgeon: Leonie Green, MD;  Location: [facility] ORS;  Service: General;  Laterality: N/A;  . COLONOSCOPY W/ BIOPSIES     Removed 5 polyps  . TUBAL LIGATION      Home Medications:    Medication List       Accurate as of 03/15/16  3:20 PM. Always use your most recent med list.          albuterol 108 (90 Base) MCG/ACT inhaler Commonly known as:  PROVENTIL HFA;VENTOLIN HFA Inhale 2 puffs into the lungs every 4 (four) hours as needed for wheezing or shortness of breath.   apixaban 5 MG Tabs tablet Commonly known as:  ELIQUIS Take 2 tablets (10 mg total) by mouth 2 (two) times daily.   apixaban 5 MG Tabs tablet Commonly known as:  ELIQUIS Take 1 tablet (5 mg total) by mouth 2 (two) times daily. Start taking on:  03/18/2016   busPIRone 5 MG tablet Commonly known as:  BUSPAR Take 5-15 mg by mouth 3 (three) times  daily as needed.   citalopram 40 MG tablet Commonly known as:  CELEXA Take 1.5 tablets (60 mg total) by mouth daily.   cyclobenzaprine 10 MG tablet Commonly known as:  FLEXERIL Take 1 tablet (10 mg total) by mouth at bedtime.   diphenhydrAMINE 25 mg capsule Commonly known as:  BENADRYL Take 2 capsules (50 mg total) by mouth every 6 (six) hours as needed.   EPINEPHrine 0.3 mg/0.3 mL Soaj injection Commonly known as:  EPIPEN 2-PAK Inject 0.3 mLs (0.3 mg total) into the muscle once.   gabapentin 300 MG capsule Commonly known as:  NEURONTIN Take by mouth.   hydrochlorothiazide 25 MG tablet Commonly known as:  HYDRODIURIL Take 1 tablet (25 mg total) by mouth daily.   magnesium oxide 400 MG tablet Commonly known as:  MAG-OX Take 400 mg by mouth daily. Reported on 05/14/2015   metoCLOPramide 10 MG tablet Commonly known as:  REGLAN Take 1 tablet (10 mg total) by mouth 4 (four) times daily -  before meals and at bedtime.   omeprazole 40 MG capsule Commonly known as:  PRILOSEC Take 40 mg by mouth daily.   topiramate 50 MG tablet Commonly known as:  TOPAMAX Take 1 tablet (50 mg total) by mouth 2 (two) times daily.   traMADol 50 MG tablet Commonly known as:  ULTRAM Take 1 tablet (50 mg total) by mouth 2 (two) times daily as needed.   traZODone 100 MG tablet Commonly known as:  DESYREL Take 100 mg by mouth at bedtime as needed for sleep.   verapamil 240 MG CR tablet Commonly known as:  CALAN-SR Take 240 mg by mouth daily.       Allergies:  Allergies  Allergen Reactions  . Rocephin [Ceftriaxone] Anaphylaxis  . Statins Anaphylaxis, Diarrhea and Nausea And Vomiting  . Compazine [Prochlorperazine] Other (See Comments)    akathesia akathesia akathesia  . Doxycycline Nausea And Vomiting  . Erythromycin Itching, Nausea And Vomiting and Rash    Patient states she is allergic to all mycin drugs  . Sulfa Antibiotics Diarrhea and Nausea And Vomiting  . Meloxicam   .  Sulfamethoxazole-Trimethoprim Other (See Comments)  . Ace Inhibitors Itching  . Biaxin [Clarithromycin] Itching, Nausea And Vomiting and Rash  . Latex Rash  . Morphine And Related Nausea And Vomiting    Family History: Family History  Problem Relation Age of Onset  . Other Mother     IBS  . Hypertension Mother   . Heart disease Father   .  Diverticulosis Father   . Migraines Father   . Other Son     Ulcerative Colitis, Polonidal cyst  . Parkinson's disease Maternal Grandmother   . Cancer Maternal Grandmother   . Mental illness Paternal Grandmother   . Stroke Paternal Grandmother   . Cancer Paternal Grandmother     brain cancer  . Bipolar disorder Paternal Grandmother   . Stroke Maternal Grandfather   . Cancer Maternal Grandfather     Bladder, Prostate/bladder/prostate  . Heart disease Paternal Grandfather   . Heart attack Paternal Grandfather   . Macular degeneration    . Diabetes Brother   . Breast cancer Maternal Aunt     Social History:  reports that she quit smoking about 9 months ago. Her smoking use included Cigarettes. She has a 0.25 pack-year smoking history. She has never used smokeless tobacco. She reports that she does not drink alcohol or use drugs.  ROS: UROLOGY Frequent Urination?: Yes Hard to postpone urination?: No Burning/pain with urination?: Yes Get up at night to urinate?: Yes Leakage of urine?: Yes Urine stream starts and stops?: Yes Trouble starting stream?: Yes Do you have to strain to urinate?: Yes Blood in urine?: No Urinary tract infection?: Yes Sexually transmitted disease?: No Injury to kidneys or bladder?: No Painful intercourse?: No Weak stream?: No Currently pregnant?: Yes Vaginal bleeding?: Yes Last menstrual period?: y  Gastrointestinal Nausea?: Yes Vomiting?: No Indigestion/heartburn?: Yes Diarrhea?: Yes Constipation?: Yes  Constitutional Fever: Yes Night sweats?: No Weight loss?: No Fatigue?: Yes  Skin Skin  rash/lesions?: No Itching?: No  Eyes Blurred vision?: No Double vision?: Yes  Ears/Nose/Throat Sore throat?: No Sinus problems?: No  Hematologic/Lymphatic Swollen glands?: Yes Easy bruising?: Yes  Cardiovascular Leg swelling?: Yes Chest pain?: Yes  Respiratory Cough?: No Shortness of breath?: Yes  Endocrine Excessive thirst?: No  Musculoskeletal Back pain?: Yes Joint pain?: Yes  Neurological Headaches?: Yes Dizziness?: Yes  Psychologic Depression?: Yes Anxiety?: Yes  Physical Exam: BP 118/79   Pulse 83   Ht 5\' 5"  (1.651 m)   Wt 117.6 kg (259 lb 3.2 oz)   LMP 07/01/2013   BMI 43.13 kg/m   Constitutional:  Alert and oriented, No acute distress. HEENT: [place] AT, moist mucus membranes.  Trachea midline, no masses. Cardiovascular: No clubbing, cyanosis, or edema. Respiratory: Normal respiratory effort, no increased work of breathing. GI: Abdomen is soft, nontender, nondistended, no abdominal masses GU: No CVA tenderness.  NO labial lesions. Moderate atrophy. Severe levator tenderness bilaterally. Urethral caruncle at 12 oclock. No cysocele or rectocele Skin: No rashes, bruises or suspicious lesions. Lymph: No cervical or inguinal adenopathy. Neurologic: Grossly intact, no focal deficits, moving all 4 extremities. Psychiatric: Normal mood and affect.  Laboratory Data: Lab Results  Component Value Date   WBC 5.5 03/10/2016   HGB 13.4 03/10/2016   HCT 39.2 03/10/2016   MCV 89.0 03/10/2016   PLT 225 03/10/2016    Lab Results  Component Value Date   CREATININE 0.93 03/10/2016    No results found for: PSA  No results found for: TESTOSTERONE  Lab Results  Component Value Date   HGBA1C 5.7 02/05/2016    Urinalysis    Component Value Date/Time   COLORURINE YELLOW (A) 03/10/2016 1913   APPEARANCEUR CLEAR (A) 03/10/2016 1913   APPEARANCEUR Cloudy (A) 12/08/2015 1013   LABSPEC 1.010 03/10/2016 1913   LABSPEC 1.005 06/07/2014 1730   PHURINE 8.0  03/10/2016 [place] 03/10/2016 [place] 06/07/2014 1730  HGBUR NEGATIVE 03/10/2016 [place] NEGATIVE 03/10/2016 1913   BILIRUBINUR Negative 12/08/2015 [place] 06/07/2014 [place] 03/10/2016 1913   PROTEINUR NEGATIVE 03/10/2016 1913   NITRITE NEGATIVE 03/10/2016 1913   LEUKOCYTESUR NEGATIVE 03/10/2016 1913   LEUKOCYTESUR Negative 12/08/2015 1013   LEUKOCYTESUR NEGATIVE 06/07/2014 1730    Pertinent Imaging: none  Assessment & Plan:    1. Microhematuria -Hematuria workup - Urinalysis, Complete - Bladder Scan (Post Void Residual) in office  2. Pelvic floor dysfunction -motrin 800mg  TID for 5 days -valium qhs PRN -RTC 1 month. If unimproved will refer to PT  3. Urethral caruncle -estrace daily for 14 days then 3x per week for 3 months  No Follow-up on file.  Nicolette Bang, MD  [facility] [address], [place], [place] [address] [phone]

## 2016-03-17 ENCOUNTER — Encounter: Payer: Self-pay | Admitting: Family Medicine

## 2016-03-17 ENCOUNTER — Ambulatory Visit (INDEPENDENT_AMBULATORY_CARE_PROVIDER_SITE_OTHER): Payer: BC Managed Care – PPO | Admitting: Family Medicine

## 2016-03-17 VITALS — BP 135/87 | HR 81 | Temp 98.5°F | Wt 258.9 lb

## 2016-03-17 DIAGNOSIS — L930 Discoid lupus erythematosus: Secondary | ICD-10-CM

## 2016-03-17 DIAGNOSIS — Z7901 Long term (current) use of anticoagulants: Secondary | ICD-10-CM

## 2016-03-17 DIAGNOSIS — I2699 Other pulmonary embolism without acute cor pulmonale: Secondary | ICD-10-CM

## 2016-03-17 DIAGNOSIS — R768 Other specified abnormal immunological findings in serum: Secondary | ICD-10-CM

## 2016-03-17 DIAGNOSIS — G43409 Hemiplegic migraine, not intractable, without status migrainosus: Secondary | ICD-10-CM

## 2016-03-17 LAB — CBC WITH DIFFERENTIAL/PLATELET
HEMATOCRIT: 43.9 % (ref 34.0–46.6)
HEMOGLOBIN: 14.8 g/dL (ref 11.1–15.9)
LYMPHS ABS: 1.4 10*3/uL (ref 0.7–3.1)
Lymphs: 27 %
MCH: 31.2 pg (ref 26.6–33.0)
MCHC: 33.7 g/dL (ref 31.5–35.7)
MCV: 92 fL (ref 79–97)
MID (ABSOLUTE): 0.3 10*3/uL (ref 0.1–1.6)
MID: 6 %
NEUTROS ABS: 3.6 10*3/uL (ref 1.4–7.0)
NEUTROS PCT: 67 %
Platelets: 195 10*3/uL (ref 150–379)
RBC: 4.75 x10E6/uL (ref 3.77–5.28)
RDW: 13.2 % (ref 12.3–15.4)
WBC: 5.3 10*3/uL (ref 3.4–10.8)

## 2016-03-17 MED ORDER — APIXABAN 5 MG PO TABS: 5.0000 mg | ORAL_TABLET | Freq: Two times a day (BID) | ORAL | 5 refills | Status: DC

## 2016-03-17 NOTE — Assessment & Plan Note (Signed)
Still unclear if this is the diagnosis or if it is Bechet's or another disorder. Patient previously treated with plaquinel. She is frustrated with going to [facility] all the time. Would like a local 2nd opinion. Referral to [facility] neurology made today and appointment scheduled for next week. She would like to transfer care there if possible. Concern now with her PE for hypercoagulable state.

## 2016-03-17 NOTE — Telephone Encounter (Signed)
I called pt again to discuss her ONO results. No answer, left a message asking her to call me back. This is my third attempt at calling her for results. Will mail her a letter asking her to call me back.

## 2016-03-17 NOTE — Assessment & Plan Note (Signed)
Still unclear if this is the diagnosis or if it is Bechet's or another disorder. Patient previously treated with plaquinel. She is frustrated with going to [facility] all the time. Would like a local 2nd opinion. Referral to [facility] neurology made today and appointment scheduled for next week. She would like to transfer care there if possible. Concern now with her PE for hypercoagulable state.

## 2016-03-17 NOTE — Progress Notes (Signed)
BP 135/87 (BP Location: Left Arm, Patient Position: Sitting, Cuff Size: Large)   Pulse 81   Temp 98.5 F (36.9 C)   Wt 258 lb 14.4 oz (117.4 kg)   LMP 07/01/2013   SpO2 99%   BMI 43.08 kg/m    Subjective:    Patient ID: Shirley Rogers, female    DOB: 11-19-1965, 50 y.o.   MRN: [id number]  HPI: Shirley Rogers is a 50 y.o. female  Chief Complaint  Patient presents with  . Hospitalization Follow-up   HOSPITAL FOLLOW UP Time since discharge: 6 days Hospital/facility: [facility] Diagnosis: RLL superior segmental pulmonary embolism Procedures/tests: CTA, LE dopplers negative for DVT Consultants: None New medications: lovenox then transitioned to Eliquis Discharge instructions:  Follow up here, needs hypercoagulable work up after completion of therapy Status: better  Relevant past medical, surgical, family and social history reviewed and updated as indicated. Interim medical history since our last visit reviewed. Allergies and medications reviewed and updated.  Review of Systems  Constitutional: Negative.   Respiratory: Negative.   Cardiovascular: Negative.   Hematological: Negative for adenopathy. Bruises/bleeds easily.  Psychiatric/Behavioral: Negative.    Per HPI unless specifically indicated above     Objective:    BP 135/87 (BP Location: Left Arm, Patient Position: Sitting, Cuff Size: Large)   Pulse 81   Temp 98.5 F (36.9 C)   Wt 258 lb 14.4 oz (117.4 kg)   LMP 07/01/2013   SpO2 99%   BMI 43.08 kg/m   Wt Readings from Last 3 Encounters:  03/17/16 258 lb 14.4 oz (117.4 kg)  03/15/16 259 lb 3.2 oz (117.6 kg)  03/11/16 261 lb 9.6 oz (118.7 kg)    Physical Exam  Constitutional: She is oriented to person, place, and time. She appears well-developed and well-nourished. No distress.  HENT:  Head: Normocephalic and atraumatic.  Right Ear: Hearing normal.  Left Ear: Hearing normal.  Nose: Nose normal.  Eyes: Conjunctivae and lids are normal. Right eye exhibits no  discharge. Left eye exhibits no discharge. No scleral icterus.  Cardiovascular: Normal rate, regular rhythm, normal heart sounds and intact distal pulses.  Exam reveals no gallop and no friction rub.   No murmur heard. Pulmonary/Chest: Effort normal. No respiratory distress. She has no wheezes. She has no rales. She exhibits no tenderness.  Musculoskeletal: Normal range of motion.  Neurological: She is alert and oriented to person, place, and time.  Skin: Skin is warm, dry and intact. No rash noted. She is not diaphoretic. No erythema. No pallor.  Psychiatric: She has a normal mood and affect. Her speech is normal and behavior is normal. Judgment and thought content normal. Cognition and memory are normal.  Nursing note and vitals reviewed.   Results for orders placed or performed in visit on 03/17/16  CBC With Differential/Platelet  Result Value Ref Range   WBC 5.3 3.4 - 10.8 x10E3/uL   RBC 4.75 3.77 - 5.28 x10E6/uL   Hemoglobin 14.8 11.1 - 15.9 g/dL   Hematocrit 43.9 34.0 - 46.6 %   MCV 92 79 - 97 fL   MCH 31.2 26.6 - 33.0 pg   MCHC 33.7 31.5 - 35.7 g/dL   RDW 13.2 12.3 - 15.4 %   Platelets 195 150 - 379 x10E3/uL   Neutrophils 67 Not Estab. %   Lymphs 27 Not Estab. %   MID 6 Not Estab. %   Neutrophils Absolute 3.6 1.4 - 7.0 x10E3/uL   Lymphocytes Absolute 1.4 0.7 - 3.1 x10E3/uL  MID (Absolute) 0.3 0.1 - 1.6 X10E3/uL      Assessment & Plan:   Problem List Items Addressed This Visit      Cardiovascular and Mediastinum   Hemiplegic migraine    Checked on referral to [facility]- appointment scheduled for beginning of December. Claiborne Billings made aware.       Relevant Medications   apixaban (ELIQUIS) 5 MG TABS tablet (Start on 03/18/2016)   Pulmonary embolus ([place]) - Primary    On eliquis. Breathing better. Will need to be on eliquis for 6 months. Will need hypercoagulable work up to see if she needs to stay on eliquis- referral to hematology made today.       Relevant Medications    apixaban (ELIQUIS) 5 MG TABS tablet (Start on 03/18/2016)   Other Relevant Orders   Ambulatory referral to Hematology     Other   Lupus    Still unclear if this is the diagnosis or if it is Bechet's or another disorder. Patient previously treated with plaquinel. She is frustrated with going to [facility] all the time. Would like a local 2nd opinion. Referral to [facility] neurology made today and appointment scheduled for next week. She would like to transfer care there if possible. Concern now with her PE for hypercoagulable state.       Relevant Orders   Ambulatory referral to Rheumatology   Ambulatory referral to Hematology   ANA positive    Still unclear if this is the diagnosis or if it is Bechet's or another disorder. Patient previously treated with plaquinel. She is frustrated with going to [facility] all the time. Would like a local 2nd opinion. Referral to [facility] neurology made today and appointment scheduled for next week. She would like to transfer care there if possible. Concern now with her PE for hypercoagulable state.       Relevant Orders   Ambulatory referral to Rheumatology    Other Visit Diagnoses    Encounter for current long-term use of anticoagulants       Checking CBC today to make sure she is not bleeding on eliquis. CBC stable. Continue current regimen. Continue to montior.    Relevant Orders   CBC With Differential/Platelet (Completed)       Follow up plan: Return in about 4 weeks (around 04/14/2016).

## 2016-03-17 NOTE — Assessment & Plan Note (Signed)
On eliquis. Breathing better. Will need to be on eliquis for 6 months. Will need hypercoagulable work up to see if she needs to stay on eliquis- referral to hematology made today.

## 2016-03-17 NOTE — Assessment & Plan Note (Signed)
Checked on referral to [facility]- appointment scheduled for beginning of December. Claiborne Billings made aware.

## 2016-03-18 ENCOUNTER — Encounter: Payer: Self-pay | Admitting: Family Medicine

## 2016-03-18 ENCOUNTER — Emergency Department: Payer: BC Managed Care – PPO

## 2016-03-18 ENCOUNTER — Encounter: Payer: Self-pay | Admitting: *Deleted

## 2016-03-18 ENCOUNTER — Emergency Department
Admission: EM | Admit: 2016-03-18 | Discharge: 2016-03-18 | Disposition: A | Discharge status: Home / Self Care | Payer: BC Managed Care – PPO | Attending: Student in an Organized Health Care Education/Training Program | Admitting: Student in an Organized Health Care Education/Training Program

## 2016-03-18 DIAGNOSIS — R600 Localized edema: Secondary | ICD-10-CM | POA: Diagnosis not present

## 2016-03-18 DIAGNOSIS — R0602 Shortness of breath: Secondary | ICD-10-CM | POA: Insufficient documentation

## 2016-03-18 DIAGNOSIS — Z87891 Personal history of nicotine dependence: Secondary | ICD-10-CM | POA: Insufficient documentation

## 2016-03-18 DIAGNOSIS — Z79899 Other long term (current) drug therapy: Secondary | ICD-10-CM | POA: Diagnosis not present

## 2016-03-18 DIAGNOSIS — I10 Essential (primary) hypertension: Secondary | ICD-10-CM | POA: Diagnosis not present

## 2016-03-18 DIAGNOSIS — M7989 Other specified soft tissue disorders: Secondary | ICD-10-CM | POA: Diagnosis present

## 2016-03-18 HISTORY — DX: Other pulmonary embolism without acute cor pulmonale: I26.99

## 2016-03-18 LAB — BRAIN NATRIURETIC PEPTIDE: B NATRIURETIC PEPTIDE 5: 64 pg/mL (ref 0.0–100.0)

## 2016-03-18 LAB — BASIC METABOLIC PANEL
Anion gap: 7 (ref 5–15)
BUN: 15 mg/dL (ref 6–20)
CALCIUM: 9 mg/dL (ref 8.9–10.3)
CO2: 28 mmol/L (ref 22–32)
Chloride: 105 mmol/L (ref 101–111)
Creatinine, Ser: 0.96 mg/dL (ref 0.44–1.00)
GFR calc Af Amer: >60 mL/min (ref >60)
Glucose, Bld: 108 mg/dL — ABNORMAL HIGH (ref 65–99)
POTASSIUM: 4 mmol/L (ref 3.5–5.1)
SODIUM: 140 mmol/L (ref 135–145)

## 2016-03-18 LAB — CBC
HEMATOCRIT: 39.5 % (ref 35.0–47.0)
Hemoglobin: 13.2 g/dL (ref 12.0–16.0)
MCH: 30.2 pg (ref 26.0–34.0)
MCHC: 33.6 g/dL (ref 32.0–36.0)
MCV: 90 fL (ref 80.0–100.0)
PLATELETS: 225 10*3/uL (ref 150–440)
RBC: 4.38 MIL/uL (ref 3.80–5.20)
RDW: 13.3 % (ref 11.5–14.5)
WBC: 5.9 10*3/uL (ref 3.6–11.0)

## 2016-03-18 LAB — TROPONIN I

## 2016-03-18 MED ORDER — T.E.D. THIGH LENGTH/L-REGULAR MISC: 2.0000 "application " | Freq: Every morning | 0 refills | Status: DC

## 2016-03-18 NOTE — ED Triage Notes (Signed)
Pt reports onset of bilateral leg swelling since this morning with intermittent chest tightness. Pt was dx last week with right sided PE, placed on Eliquis. She has had continued shortness of breath.

## 2016-03-18 NOTE — ED Provider Notes (Signed)
[facility] Emergency Department Provider Note    First MD Initiated Contact with Patient 03/18/16 2025     (approximate)  I have reviewed the triage vital signs and the nursing notes.   HISTORY  Chief Complaint Leg Swelling    HPI Shirley Rogers is a 50 y.o. female recently saw the ER and diagnosed with a pulmonary embolism presents with complaint of bilateral leg swelling and intermittent chest tightness after being diagnosed with PE. She has been taking her Eliquis as directed. States that shortness of breath and weakness has been unchanged since last time I saw her. She denies any chest pain right now. Denies any orthopnea. Denies any fevers. She called her primary care physician due to concern for lower extremity swelling. They told her to come to the ER.   Past Medical History:  Diagnosis Date  . Anxiety    on xanax in the past  . ASCVD (arteriosclerotic cardiovascular disease)   . Barrett esophagus   . 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 Carotid atherosclerosis 2003   on ultrasound  . Collagen vascular disease ([place])   . Colon polyp   . Complicated migraine    with facial drooping  . Concussion    1974,1982,1987,2016,2016  . DDD (degenerative disc disease), lumbar    MRI 2008, L3-4, L5-S1, spondylotic changes, multilevel facet joint hypertrophic changes  . Depression    on paxil, lexapro, cymbalta in the past  . Diverticulosis   . Dizziness due to old head injury   . Family history of adverse reaction to anesthesia    father - slow to wake  . Fatty liver 2013   On CT abdomen/pelvis  . Fibrocystic breast disease   . GERD (gastroesophageal reflux disease)   . Hemiplegic migraine    daily  . Hemorrhoids   . Hyperlipidemia   . Hypertension    no meds currently.  . IBS (irritable bowel syndrome)   . Insomnia chronic  . Kidney stone 2013   on CT abdomen/pelvis  . 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 Migraine   . Narrowing of intervertebral disc space 2011   C5-6  . Normal cardiac stress test 2009   EF 66%  . Pulmonary emboli ([place])   . Recurrent sinusitis   . Recurrent UTI    question about IC in the past  . Sleep apnea    CPAP machine broken  . Stroke ([place])    2000 - no deficits  . Umbilical hernia 0000000   on CT abdomen/pelvis  . Varicose veins    right lower leg  . Vascular spasm ([place])   . Vertigo    from concussive disorder.  seeing neuro 03/23/15    Patient Active Problem List   Diagnosis Date Noted  . Microhematuria 03/15/2016  . Pelvic floor dysfunction 03/15/2016  . Urethral caruncle 03/15/2016  . Pulmonary embolus ([place]) 03/11/2016  . Facial droop 02/04/2016  . Right hemiparesis ([place]) 02/04/2016  . Tobacco use 12/03/2015  . ANA positive 10/19/2015  . Transient alteration of awareness 09/24/2015  . Snoring 06/10/2015  . Sleep apnea 06/10/2015  . 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 Globus sensation 03/30/2015  . Post concussion syndrome 03/13/2015  . Depression 03/13/2015  . Benign hypertensive renal disease   . Barrett esophagus   . Diverticulosis   . Hemiplegic migraine   .  Lupus   . Stroke ([place])   . Fall 05/25/2014    Past Surgical History:  Procedure Laterality Date  . Biopsy Punch Thyroid    . BREAST EXCISIONAL BIOPSY Right 2000   right mass excision - atypical hyperplasia  . BREAST SURGERY Right    atypical hyperplasia  . CHOLECYSTECTOMY N/A 07/24/2015   Procedure: LAPAROSCOPIC CHOLECYSTECTOMY WITH INTRAOPERATIVE CHOLANGIOGRAM;  Surgeon: Leonie Green, MD;  Location: [facility] ORS;  Service: General;  Laterality: N/A;  . COLONOSCOPY W/ BIOPSIES     Removed 5 polyps  . TUBAL LIGATION      Prior to Admission medications   Medication Sig Start Date End Date Taking? Authorizing Provider  albuterol (PROVENTIL HFA;VENTOLIN HFA) 108 (90 Base) MCG/ACT inhaler Inhale 2  puffs into the lungs every 4 (four) hours as needed for wheezing or shortness of breath.    Historical Provider, MD  apixaban (ELIQUIS) 5 MG TABS tablet Take 2 tablets (10 mg total) by mouth 2 (two) times daily. 03/11/16 03/18/16  Bettey Costa, MD  apixaban (ELIQUIS) 5 MG TABS tablet Take 1 tablet (5 mg total) by mouth 2 (two) times daily. 03/18/16   Megan P Johnson, DO  busPIRone (BUSPAR) 5 MG tablet Take 5-15 mg by mouth 3 (three) times daily as needed.    Historical Provider, MD  citalopram (CELEXA) 40 MG tablet Take 1.5 tablets (60 mg total) by mouth daily. 02/19/16   Megan P Johnson, DO  cyclobenzaprine (FLEXERIL) 10 MG tablet Take 1 tablet (10 mg total) by mouth at bedtime. 02/19/16   Megan P Johnson, DO  diazepam (VALIUM) 5 MG tablet Take 1 tablet (5 mg total) by mouth every 6 (six) hours as needed for anxiety. 03/15/16   Cleon Gustin, MD  diphenhydrAMINE (BENADRYL) 25 mg capsule Take 2 capsules (50 mg total) by mouth every 6 (six) hours as needed. 02/19/16   Megan Annia Friendly, DO  Elastic Bandages & Supports (T.E.D. THIGH LENGTH/L-REGULAR) MISC 2 application by Does not apply route every morning. 03/18/16   Merlyn Lot, MD  EPINEPHrine (EPIPEN 2-PAK) 0.3 mg/0.3 mL IJ SOAJ injection Inject 0.3 mLs (0.3 mg total) into the muscle once. 09/24/15   Megan P Johnson, DO  gabapentin (NEURONTIN) 300 MG capsule Take by mouth. 03/02/16 08/29/16  Historical Provider, MD  hydrochlorothiazide (HYDRODIURIL) 25 MG tablet Take 1 tablet (25 mg total) by mouth daily. 11/03/15   Megan P Johnson, DO  ibuprofen (ADVIL,MOTRIN) 800 MG tablet Take 1 tablet (800 mg total) by mouth 3 (three) times daily. 03/15/16   Cleon Gustin, MD  magnesium oxide (MAG-OX) 400 MG tablet Take 400 mg by mouth daily. Reported on 05/14/2015    Historical Provider, MD  metoCLOPramide (REGLAN) 10 MG tablet Take 1 tablet (10 mg total) by mouth 4 (four) times daily -  before meals and at bedtime. 12/14/15   Carrie Mew, MD  omeprazole  (PRILOSEC) 40 MG capsule Take 40 mg by mouth daily.    Historical Provider, MD  topiramate (TOPAMAX) 50 MG tablet Take 1 tablet (50 mg total) by mouth 2 (two) times daily. 12/07/15   Penni Bombard, MD  traMADol (ULTRAM) 50 MG tablet Take 1 tablet (50 mg total) by mouth 2 (two) times daily as needed. 02/16/16   Megan P Johnson, DO  traZODone (DESYREL) 100 MG tablet Take 100 mg by mouth at bedtime as needed for sleep.    Historical Provider, MD  verapamil (CALAN-SR) 240 MG CR tablet Take 240 mg by mouth  daily.  12/03/15   Historical Provider, MD    Allergies Rocephin [ceftriaxone]; Statins; Compazine [prochlorperazine]; Doxycycline; Erythromycin; Sulfa antibiotics; Meloxicam; Sulfamethoxazole-trimethoprim; Ace inhibitors; Biaxin [clarithromycin]; Latex; and Morphine and related  Family History  Problem Relation Age of Onset  . Other Mother     IBS  . Hypertension Mother   . Heart disease Father   . Diverticulosis Father   . Migraines Father   . Other Son     Ulcerative Colitis, Polonidal cyst  . Parkinson's disease Maternal Grandmother   . Cancer Maternal Grandmother   . Mental illness Paternal Grandmother   . Stroke Paternal Grandmother   . Cancer Paternal Grandmother     brain cancer  . Bipolar disorder Paternal Grandmother   . Stroke Maternal Grandfather   . Cancer Maternal Grandfather     Bladder, Prostate/bladder/prostate  . Heart disease Paternal Grandfather   . Heart attack Paternal Grandfather   . Macular degeneration    . Diabetes Brother   . Breast cancer Maternal Aunt     Social History Social History  Substance Use Topics  . Smoking status: Former Smoker    Packs/day: 0.25    Years: 1.00    Types: Cigarettes    Quit date: 06/12/2015  . Smokeless tobacco: Never Used     Comment: 1-2 cigarettes approx 3 x/wk  . Alcohol use No     Comment: social    Review of Systems Patient denies headaches, rhinorrhea, blurry vision, numbness, shortness of breath, chest  pain, edema, cough, abdominal pain, nausea, vomiting, diarrhea, dysuria, fevers, rashes or hallucinations unless otherwise stated above in HPI. ____________________________________________   PHYSICAL EXAM:  VITAL SIGNS: Vitals:   03/18/16 2130 03/18/16 2200  BP: 116/73 111/71  Pulse: 81 73  Resp:    Temp:      Constitutional: Alert and oriented. Well appearing and in no acute distress. Eyes: Conjunctivae are normal. PERRL. EOMI. Head: Atraumatic. Nose: No congestion/rhinnorhea. Mouth/Throat: Mucous membranes are moist.  Oropharynx non-erythematous. Neck: No stridor. Painless ROM. No cervical spine tenderness to palpation Hematological/Lymphatic/Immunilogical: No cervical lymphadenopathy. Cardiovascular: Normal rate, regular rhythm. Grossly normal heart sounds.  Good peripheral circulation. Respiratory: Normal respiratory effort.  No retractions. Lungs CTAB. Gastrointestinal: Soft and nontender. No distention. No abdominal bruits. No CVA tenderness. Genitourinary:  Musculoskeletal: No lower extremity tenderness nor edema.  No joint effusions. Neurologic:  Normal speech and language. No gross focal neurologic deficits are appreciated. No gait instability. Skin:  Skin is warm, dry and intact. No rash noted. Psychiatric: Mood and affect are normal. Speech and behavior are normal.  ____________________________________________   LABS (all labs ordered are listed, but only abnormal results are displayed)  Results for orders placed or performed during the hospital encounter of 03/18/16 (from the past 24 hour(s))  Basic metabolic panel     Status: Abnormal   Collection Time: 03/18/16  8:33 PM  Result Value Ref Range   Sodium 140 135 - 145 mmol/L   Potassium 4.0 3.5 - 5.1 mmol/L   Chloride 105 101 - 111 mmol/L   CO2 28 22 - 32 mmol/L   Glucose, Bld 108 (H) 65 - 99 mg/dL   BUN 15 6 - 20 mg/dL   Creatinine, Ser 0.96 0.44 - 1.00 mg/dL   Calcium 9.0 8.9 - 10.3 mg/dL   GFR calc non  Af Amer >60 >60 mL/min   GFR calc Af Amer >60 >60 mL/min   Anion gap 7 5 - 15  CBC  Status: None   Collection Time: 03/18/16  8:33 PM  Result Value Ref Range   WBC 5.9 3.6 - 11.0 K/uL   RBC 4.38 3.80 - 5.20 MIL/uL   Hemoglobin 13.2 12.0 - 16.0 g/dL   HCT 39.5 35.0 - 47.0 %   MCV 90.0 80.0 - 100.0 fL   MCH 30.2 26.0 - 34.0 pg   MCHC 33.6 32.0 - 36.0 g/dL   RDW 13.3 11.5 - 14.5 %   Platelets 225 150 - 440 K/uL  Troponin I     Status: None   Collection Time: 03/18/16  8:33 PM  Result Value Ref Range   Troponin I <0.03 <0.03 ng/mL  Brain natriuretic peptide     Status: None   Collection Time: 03/18/16  8:33 PM  Result Value Ref Range   B Natriuretic Peptide 64.0 0.0 - 100.0 pg/mL   ____________________________________________  EKG My review and personal interpretation at Time: 19:35   Indication: dvt  Rate: 80  Rhythm: sinus Axis: normal Other: no acute ischemia ____________________________________________  RADIOLOGY  I personally reviewed all radiographic images ordered to evaluate for the above acute complaints and reviewed radiology reports and findings.  These findings were personally discussed with the patient.  Please see medical record for radiology report. ____________________________________________   PROCEDURES  Procedure(s) performed: none    Critical Care performed: no ____________________________________________   INITIAL IMPRESSION / ASSESSMENT AND PLAN / ED COURSE  Pertinent labs & imaging results that were available during my care of the patient were reviewed by me and considered in my medical decision making (see chart for details).  DDX: edema, chf, acs, pe  SHANICA CREGAN is a 50 y.o. who presents to the ED with complaint of lower extremity edema at the recommendation of her primary care physician. And afebrile hemodynamic stable. She has no evidence of right heart strain. Laboratory evaluation is reassuring. Patient is on Eliquis. She has good  perfusion the lower extremities. This is not consistent with ischemia or congestive heart failure. Not consistent with ACS. Discussed conservative management follow with PCP.  Clinical Course     ____________________________________________   FINAL CLINICAL IMPRESSION(S) / ED DIAGNOSES  Final diagnoses:  Leg edema      NEW MEDICATIONS STARTED DURING THIS VISIT:  Discharge Medication List as of 03/18/2016 10:01 PM    START taking these medications   Details  Elastic Bandages & Supports (T.E.D. THIGH LENGTH/L-REGULAR) MISC 2 application by Does not apply route every morning., Starting Fri 03/18/2016, Print         Note:  This document was prepared using Dragon voice recognition software and may include unintentional dictation errors.    Merlyn Lot, MD 03/19/16 [phone]

## 2016-03-18 NOTE — ED Notes (Signed)
Pt reports hx of PE and today is having swelling in her legs and some shortness of breath. Talking in full sentences at registration desk with no distress noted.

## 2016-03-21 ENCOUNTER — Ambulatory Visit: Payer: BC Managed Care – PPO | Admitting: Family Medicine

## 2016-03-21 ENCOUNTER — Other Ambulatory Visit: Payer: Self-pay | Admitting: Family Medicine

## 2016-03-21 MED ORDER — TRAMADOL HCL 50 MG PO TABS: 50.0000 mg | ORAL_TABLET | Freq: Two times a day (BID) | ORAL | 0 refills | Status: DC | PRN

## 2016-03-24 ENCOUNTER — Ambulatory Visit
Admission: RE | Admit: 2016-03-24 | Discharge: 2016-03-24 | Disposition: A | Discharge status: Home / Self Care | Payer: BC Managed Care – PPO | Source: Ambulatory Visit | Attending: Urology | Admitting: Urology

## 2016-03-24 DIAGNOSIS — N200 Calculus of kidney: Secondary | ICD-10-CM | POA: Diagnosis not present

## 2016-03-24 DIAGNOSIS — N281 Cyst of kidney, acquired: Secondary | ICD-10-CM | POA: Diagnosis not present

## 2016-03-24 DIAGNOSIS — R3129 Other microscopic hematuria: Secondary | ICD-10-CM

## 2016-03-24 MED ORDER — IOPAMIDOL (ISOVUE-300) INJECTION 61%
125.0000 mL | Freq: Once | INTRAVENOUS | Status: AC | PRN
  Administered 2016-03-24: 125 mL via INTRAVENOUS

## 2016-03-25 ENCOUNTER — Other Ambulatory Visit: Payer: Self-pay | Admitting: Family Medicine

## 2016-03-31 ENCOUNTER — Encounter: Payer: Self-pay | Admitting: Family Medicine

## 2016-03-31 ENCOUNTER — Ambulatory Visit (INDEPENDENT_AMBULATORY_CARE_PROVIDER_SITE_OTHER): Payer: BC Managed Care – PPO | Admitting: Family Medicine

## 2016-03-31 VITALS — BP 131/82 | HR 81 | Temp 98.7°F | Wt 271.0 lb

## 2016-03-31 DIAGNOSIS — R0609 Other forms of dyspnea: Secondary | ICD-10-CM | POA: Diagnosis not present

## 2016-03-31 DIAGNOSIS — R600 Localized edema: Secondary | ICD-10-CM

## 2016-03-31 DIAGNOSIS — M352 Behcet's disease: Secondary | ICD-10-CM | POA: Diagnosis not present

## 2016-03-31 MED ORDER — PREDNISONE 20 MG PO TABS: 40.0000 mg | ORAL_TABLET | Freq: Every day | ORAL | 0 refills | Status: DC

## 2016-03-31 MED ORDER — FUROSEMIDE 20 MG PO TABS: 20.0000 mg | ORAL_TABLET | Freq: Every day | ORAL | 3 refills | Status: DC

## 2016-03-31 NOTE — Progress Notes (Signed)
BP 131/82   Pulse 81   Temp 98.7 F (37.1 C)   Wt 271 lb (122.9 kg)   LMP 07/01/2013   SpO2 95%   BMI 45.10 kg/m    Subjective:    Patient ID: Shirley Rogers, female    DOB: 09/06/1965, 50 y.o.   MRN: [id number]  HPI: Shirley Rogers is a 50 y.o. female  Chief Complaint  Patient presents with  . Leg Swelling    Bialteral legs, Ted hose aren't helping. 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   Patient presents for several ongoing issues. B/l edema and burning pain in LEs. Still having SOB with exertion. Multiple ER visits recently for these sxs, workups negative since PE last month.  Was told in ER to start wearing compression hose daily to help with LE edema, especially on work days where she is on her feet all day. Has been restricting salt, mostly eating bland foods like oatmeal. Has gained almost 15 lb in 2 weeks.  Had [facility] eval last week for second opinion, they have recommended she remain under the care of [facility]. States she is not able to get appt there until February and has not been started on any treatments for her Behcet's.   Past Medical History:  Diagnosis Date  . Anxiety    on xanax in the past  . ASCVD (arteriosclerotic cardiovascular disease)   . Barrett esophagus   . 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 Carotid atherosclerosis 2003   on ultrasound  . Collagen vascular disease ([place])   . Colon polyp   . Complicated migraine    with facial drooping  . Concussion    1974,1982,1987,2016,2016  . DDD (degenerative disc disease), lumbar    MRI 2008, L3-4, L5-S1, spondylotic changes, multilevel facet joint hypertrophic changes  . Depression    on paxil, lexapro, cymbalta in the past  . Diverticulosis   . Dizziness due to old head injury   . Family history of adverse reaction to anesthesia    father - slow to wake  . Fatty liver 2013   On CT abdomen/pelvis  . Fibrocystic breast disease   . GERD (gastroesophageal  reflux disease)   . Hemiplegic migraine    daily  . Hemorrhoids   . Hyperlipidemia   . Hypertension    no meds currently.  . IBS (irritable bowel syndrome)   . Insomnia chronic  . Kidney stone 2013   on CT abdomen/pelvis  . 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 Migraine   . Narrowing of intervertebral disc space 2011   C5-6  . Normal cardiac stress test 2009   EF 66%  . Pulmonary emboli ([place])   . Recurrent sinusitis   . Recurrent UTI    question about IC in the past  . Sleep apnea    CPAP machine broken  . Stroke ([place])    2000 - no deficits  . Umbilical hernia 0000000   on CT abdomen/pelvis  . Varicose veins    right lower leg  . Vascular spasm ([place])   . Vertigo    from concussive disorder.  seeing neuro 03/23/15   Social History   Social History  . Marital status: Divorced    Spouse name: N/A  . Number of children: 1  . Years of education: 8   Occupational History  .  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 Smoking status: Former Smoker    Packs/day: 0.25    Years: 1.00    Types: Cigarettes    Quit date: 06/12/2015  . Smokeless tobacco: Never Used     Comment: 1-2 cigarettes approx 3 x/wk  . Alcohol use No     Comment: social  . Drug use: No  . Sexual activity: No     Comment: tubal ligation and menopause   Other Topics Concern  . Not on file   Social History Narrative   Lives at home with friend   Caffeine use- coffee 1 cup daily    Relevant past medical, surgical, family and social history reviewed and updated as indicated. Interim medical history since our last visit reviewed. Allergies and medications reviewed and updated.  Review of Systems  Constitutional: Negative.   HENT: Negative.   Respiratory: Negative.   Cardiovascular: Positive for leg swelling.  Gastrointestinal: Negative.   Genitourinary: Negative.   Musculoskeletal: Positive for arthralgias.    Neurological: Negative.   Psychiatric/Behavioral: Positive for dysphoric mood.    Per HPI unless specifically indicated above     Objective:    BP 131/82   Pulse 81   Temp 98.7 F (37.1 C)   Wt 271 lb (122.9 kg)   LMP 07/01/2013   SpO2 95%   BMI 45.10 kg/m   Wt Readings from Last 3 Encounters:  03/31/16 271 lb (122.9 kg)  03/18/16 258 lb (117 kg)  03/17/16 258 lb 14.4 oz (117.4 kg)    Physical Exam  Constitutional: She is oriented to person, place, and time. She appears well-developed and well-nourished.  HENT:  Head: Atraumatic.  Eyes: Conjunctivae are normal. No scleral icterus.  Neck: Normal range of motion. Neck supple.  Cardiovascular: Normal rate and normal heart sounds.   Pulmonary/Chest: Effort normal. No respiratory distress.  Musculoskeletal: Normal range of motion. She exhibits edema (moderate edema b/l LEs) and tenderness (B/l LEs moderately TTP).  Neurological: She is alert and oriented to person, place, and time.  Skin: Skin is warm and dry. No erythema.  Psychiatric: She has a normal mood and affect. Her behavior is normal.  Nursing note and vitals reviewed.     Assessment & Plan:   Problem List Items Addressed This Visit    None    Visit Diagnoses    Bilateral leg edema    -  Primary   Will do 2 weeks of lasix 20 mg with close BP monitoring at home. Re-evaulate weight and sxs in 2 weeks   DOE (dyspnea on exertion)       Likely will improve with prednisone and lasix to help reduce inflammation and fluid overload. Workups negative for CHF, on eliquis since PE   Behcet's disease ([place])       Recommended continued attempts to get in with [facility] sooner than February (as soon as possible). Will give 2 weeks of prednisone in meantime.        Follow up plan: Return in about 2 years (around 03/31/2018) for LE swelling.

## 2016-04-07 ENCOUNTER — Inpatient Hospital Stay: Payer: BC Managed Care – PPO | Attending: Internal Medicine | Admitting: Internal Medicine

## 2016-04-07 VITALS — BP 150/95 | HR 79 | Temp 98.5°F | Ht 65.0 in | Wt 260.4 lb

## 2016-04-07 DIAGNOSIS — K649 Unspecified hemorrhoids: Secondary | ICD-10-CM | POA: Insufficient documentation

## 2016-04-07 DIAGNOSIS — M5136 Other intervertebral disc degeneration, lumbar region: Secondary | ICD-10-CM | POA: Diagnosis not present

## 2016-04-07 DIAGNOSIS — M352 Behcet's disease: Secondary | ICD-10-CM

## 2016-04-07 DIAGNOSIS — I2699 Other pulmonary embolism without acute cor pulmonale: Secondary | ICD-10-CM

## 2016-04-07 DIAGNOSIS — I10 Essential (primary) hypertension: Secondary | ICD-10-CM | POA: Insufficient documentation

## 2016-04-07 DIAGNOSIS — I2510 Atherosclerotic heart disease of native coronary artery without angina pectoris: Secondary | ICD-10-CM

## 2016-04-07 DIAGNOSIS — Z87440 Personal history of urinary (tract) infections: Secondary | ICD-10-CM | POA: Insufficient documentation

## 2016-04-07 DIAGNOSIS — K760 Fatty (change of) liver, not elsewhere classified: Secondary | ICD-10-CM

## 2016-04-07 DIAGNOSIS — G43409 Hemiplegic migraine, not intractable, without status migrainosus: Secondary | ICD-10-CM | POA: Diagnosis not present

## 2016-04-07 DIAGNOSIS — E785 Hyperlipidemia, unspecified: Secondary | ICD-10-CM | POA: Diagnosis not present

## 2016-04-07 DIAGNOSIS — R3129 Other microscopic hematuria: Secondary | ICD-10-CM | POA: Diagnosis not present

## 2016-04-07 DIAGNOSIS — Z8052 Family history of malignant neoplasm of bladder: Secondary | ICD-10-CM

## 2016-04-07 DIAGNOSIS — K5790 Diverticulosis of intestine, part unspecified, without perforation or abscess without bleeding: Secondary | ICD-10-CM | POA: Diagnosis not present

## 2016-04-07 DIAGNOSIS — Z86010 Personal history of colonic polyps: Secondary | ICD-10-CM | POA: Insufficient documentation

## 2016-04-07 DIAGNOSIS — K2270 Barrett's esophagus without dysplasia: Secondary | ICD-10-CM | POA: Insufficient documentation

## 2016-04-07 DIAGNOSIS — F419 Anxiety disorder, unspecified: Secondary | ICD-10-CM | POA: Diagnosis not present

## 2016-04-07 DIAGNOSIS — K219 Gastro-esophageal reflux disease without esophagitis: Secondary | ICD-10-CM | POA: Insufficient documentation

## 2016-04-07 DIAGNOSIS — E8849 Other mitochondrial metabolism disorders: Secondary | ICD-10-CM

## 2016-04-07 DIAGNOSIS — F329 Major depressive disorder, single episode, unspecified: Secondary | ICD-10-CM | POA: Diagnosis not present

## 2016-04-07 DIAGNOSIS — Z7901 Long term (current) use of anticoagulants: Secondary | ICD-10-CM

## 2016-04-07 DIAGNOSIS — R42 Dizziness and giddiness: Secondary | ICD-10-CM

## 2016-04-07 DIAGNOSIS — Z8042 Family history of malignant neoplasm of prostate: Secondary | ICD-10-CM | POA: Insufficient documentation

## 2016-04-07 DIAGNOSIS — G4700 Insomnia, unspecified: Secondary | ICD-10-CM | POA: Diagnosis not present

## 2016-04-07 DIAGNOSIS — N3020 Other chronic cystitis without hematuria: Secondary | ICD-10-CM | POA: Diagnosis not present

## 2016-04-07 DIAGNOSIS — Z808 Family history of malignant neoplasm of other organs or systems: Secondary | ICD-10-CM | POA: Insufficient documentation

## 2016-04-07 DIAGNOSIS — R109 Unspecified abdominal pain: Secondary | ICD-10-CM | POA: Diagnosis not present

## 2016-04-07 DIAGNOSIS — Z87442 Personal history of urinary calculi: Secondary | ICD-10-CM

## 2016-04-07 DIAGNOSIS — G4730 Sleep apnea, unspecified: Secondary | ICD-10-CM

## 2016-04-07 DIAGNOSIS — Z9049 Acquired absence of other specified parts of digestive tract: Secondary | ICD-10-CM

## 2016-04-07 DIAGNOSIS — K589 Irritable bowel syndrome without diarrhea: Secondary | ICD-10-CM | POA: Insufficient documentation

## 2016-04-08 ENCOUNTER — Ambulatory Visit (INDEPENDENT_AMBULATORY_CARE_PROVIDER_SITE_OTHER): Payer: BC Managed Care – PPO | Admitting: Urology

## 2016-04-08 VITALS — BP 126/83 | HR 79 | Ht 65.0 in | Wt 260.8 lb

## 2016-04-08 DIAGNOSIS — M6289 Other specified disorders of muscle: Secondary | ICD-10-CM

## 2016-04-08 DIAGNOSIS — R3129 Other microscopic hematuria: Secondary | ICD-10-CM

## 2016-04-08 LAB — MICROSCOPIC EXAMINATION

## 2016-04-08 LAB — URINALYSIS, COMPLETE
BILIRUBIN UA: NEGATIVE
GLUCOSE, UA: NEGATIVE
KETONES UA: NEGATIVE
Leukocytes, UA: NEGATIVE
NITRITE UA: NEGATIVE
Protein, UA: NEGATIVE
SPEC GRAV UA: 1.025 (ref 1.005–1.030)
UUROB: 0.2 mg/dL (ref 0.2–1.0)
pH, UA: 5.5 (ref 5.0–7.5)

## 2016-04-08 MED ORDER — LIDOCAINE HCL 2 % EX GEL
1.0000 "application " | Freq: Once | CUTANEOUS | Status: AC
  Administered 2016-04-08: 1 via URETHRAL

## 2016-04-08 MED ORDER — DIAZEPAM 5 MG PO TABS: 5.0000 mg | ORAL_TABLET | Freq: Four times a day (QID) | ORAL | 2 refills | Status: DC | PRN

## 2016-04-08 MED ORDER — CIPROFLOXACIN HCL 500 MG PO TABS
500.0000 mg | ORAL_TABLET | Freq: Once | ORAL | Status: AC
  Administered 2016-04-08: 500 mg via ORAL

## 2016-04-08 NOTE — Progress Notes (Addendum)
04/08/2016 10:26 AM   Shirley Rogers 02-09-1966 [id number]  Referring provider: Valerie Roys, DO [place], [place] [address]  Chief Complaint  Patient presents with  . Cysto    HPI: Ms Bodle is a 50yo here for followup for pelvic floor dysfunction and microhematuria. Her pelvic pain has improved with motrin and valium. No dysuria, no worsening LUTS. No gross hematuria. She has been using the estrace 3x per week. Ct scan showed bilateral 2-105mm renal calculi   PMH: Past Medical History:  Diagnosis Date  . Anxiety    on xanax in the past  . ASCVD (arteriosclerotic cardiovascular disease)   . Barrett esophagus   . 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 Carotid atherosclerosis 2003   on ultrasound  . Collagen vascular disease ([place])   . Colon polyp   . Complicated migraine    with facial drooping  . Concussion    1974,1982,1987,2016,2016  . DDD (degenerative disc disease), lumbar    MRI 2008, L3-4, L5-S1, spondylotic changes, multilevel facet joint hypertrophic changes  . Depression    on paxil, lexapro, cymbalta in the past  . Diverticulosis   . Dizziness due to old head injury   . Family history of adverse reaction to anesthesia    father - slow to wake  . Fatty liver 2013   On CT abdomen/pelvis  . Fibrocystic breast disease   . GERD (gastroesophageal reflux disease)   . Hemiplegic migraine    daily  . Hemorrhoids   . Hyperlipidemia   . Hypertension    no meds currently.  . IBS (irritable bowel syndrome)   . Insomnia chronic  . Kidney stone 2013   on CT abdomen/pelvis  . 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 Migraine   . Narrowing of intervertebral disc space 2011   C5-6  . Normal cardiac stress test 2009   EF 66%  . Pulmonary emboli ([place])   . Recurrent sinusitis   . Recurrent UTI    question about IC in the past  . Sleep apnea    CPAP machine broken  . Stroke ([place])    2000 - no deficits  .  Umbilical hernia 0000000   on CT abdomen/pelvis  . Varicose veins    right lower leg  . Vascular spasm ([place])   . Vertigo    from concussive disorder.  seeing neuro 03/23/15    Surgical History: Past Surgical History:  Procedure Laterality Date  . Biopsy Punch Thyroid    . BREAST EXCISIONAL BIOPSY Right 2000   right mass excision - atypical hyperplasia  . BREAST SURGERY Right    atypical hyperplasia  . CHOLECYSTECTOMY N/A 07/24/2015   Procedure: LAPAROSCOPIC CHOLECYSTECTOMY WITH INTRAOPERATIVE CHOLANGIOGRAM;  Surgeon: Leonie Green, MD;  Location: [facility] ORS;  Service: General;  Laterality: N/A;  . COLONOSCOPY W/ BIOPSIES     Removed 5 polyps  . TUBAL LIGATION      Home Medications:    Medication List       Accurate as of 04/08/16 10:26 AM. Always use your most recent med list.          albuterol 108 (90 Base) MCG/ACT inhaler Commonly known as:  PROVENTIL HFA;VENTOLIN HFA Inhale 2 puffs into the lungs every 4 (four) hours as needed for wheezing or shortness of breath.   apixaban 5 MG Tabs tablet Commonly known as:  ELIQUIS Take 1 tablet (  5 mg total) by mouth 2 (two) times daily.   b complex vitamins tablet Take 1 tablet by mouth daily.   citalopram 40 MG tablet Commonly known as:  CELEXA Take 1.5 tablets (60 mg total) by mouth daily.   diazepam 5 MG tablet Commonly known as:  VALIUM Take 1 tablet (5 mg total) by mouth every 6 (six) hours as needed for anxiety.   diphenhydrAMINE 25 mg capsule Commonly known as:  BENADRYL Take 2 capsules (50 mg total) by mouth every 6 (six) hours as needed.   EPINEPHrine 0.3 mg/0.3 mL Soaj injection Commonly known as:  EPIPEN 2-PAK Inject 0.3 mLs (0.3 mg total) into the muscle once.   furosemide 20 MG tablet Commonly known as:  LASIX Take 20 mg by mouth daily.   gabapentin 300 MG capsule Commonly known as:  NEURONTIN Take by mouth.   magnesium oxide 400 MG tablet Commonly known as:  MAG-OX Take 400 mg by mouth  daily. Reported on 05/14/2015   metoCLOPramide 10 MG tablet Commonly known as:  REGLAN Take 1 tablet (10 mg total) by mouth 4 (four) times daily -  before meals and at bedtime.   omeprazole 40 MG capsule Commonly known as:  PRILOSEC Take 40 mg by mouth daily.   predniSONE 20 MG tablet Commonly known as:  DELTASONE Take 2 tablets (40 mg total) by mouth daily with breakfast.   T.E.D. THIGH LENGTH/L-REGULAR Misc 2 application by Does not apply route every morning.   topiramate 50 MG tablet Commonly known as:  TOPAMAX Take 1 tablet (50 mg total) by mouth 2 (two) times daily.   traMADol 50 MG tablet Commonly known as:  ULTRAM Take 1 tablet (50 mg total) by mouth 2 (two) times daily as needed.   traZODone 100 MG tablet Commonly known as:  DESYREL TAKE 1 TABLET(100 MG) BY MOUTH AT BEDTIME AS NEEDED FOR SLEEP   verapamil 240 MG CR tablet Commonly known as:  CALAN-SR Take 240 mg by mouth daily.       Allergies:  Allergies  Allergen Reactions  . Rocephin [Ceftriaxone] Anaphylaxis  . Statins Anaphylaxis, Diarrhea and Nausea And Vomiting  . Compazine [Prochlorperazine] Other (See Comments)    akathesia akathesia akathesia  . Doxycycline Nausea And Vomiting  . Erythromycin Itching, Nausea And Vomiting and Rash    Patient states she is allergic to all mycin drugs  . Sulfa Antibiotics Diarrhea and Nausea And Vomiting  . Meloxicam   . Sulfamethoxazole-Trimethoprim Other (See Comments)  . Ace Inhibitors Itching  . Biaxin [Clarithromycin] Itching, Nausea And Vomiting and Rash  . Latex Rash  . Morphine And Related Nausea And Vomiting    Family History: Family History  Problem Relation Age of Onset  . Other Mother     IBS  . Hypertension Mother   . Heart disease Father   . Diverticulosis Father   . Migraines Father   . Other Son     Ulcerative Colitis, Polonidal cyst  . Parkinson's disease Maternal Grandmother   . Cancer Maternal Grandmother   . Mental illness  Paternal Grandmother   . Stroke Paternal Grandmother   . Cancer Paternal Grandmother     brain cancer  . Bipolar disorder Paternal Grandmother   . Stroke Maternal Grandfather   . Cancer Maternal Grandfather     Bladder, Prostate/bladder/prostate  . Heart disease Paternal Grandfather   . Heart attack Paternal Grandfather   . Macular degeneration    . Diabetes Brother   . Breast  cancer Maternal Aunt     Social History:  reports that she quit smoking about 9 months ago. Her smoking use included Cigarettes. She has a 0.25 pack-year smoking history. She has never used smokeless tobacco. She reports that she does not drink alcohol or use drugs.  ROS:                                        Physical Exam: BP 126/83   Pulse 79   Ht 5\' 5"  (1.651 m)   Wt 118.3 kg (260 lb 12.8 oz)   LMP 07/01/2013   BMI 43.40 kg/m   Constitutional:  Alert and oriented, No acute distress. HEENT: [place] AT, moist mucus membranes.  Trachea midline, no masses. Cardiovascular: No clubbing, cyanosis, or edema. Respiratory: Normal respiratory effort, no increased work of breathing. GI: Abdomen is soft, nontender, nondistended, no abdominal masses GU: No CVA tenderness.  Skin: No rashes, bruises or suspicious lesions. Lymph: No cervical or inguinal adenopathy. Neurologic: Grossly intact, no focal deficits, moving all 4 extremities. Psychiatric: Normal mood and affect.  Laboratory Data: Lab Results  Component Value Date   WBC 5.9 03/18/2016   HGB 13.2 03/18/2016   HCT 39.5 03/18/2016   MCV 90.0 03/18/2016   PLT 225 03/18/2016    Lab Results  Component Value Date   CREATININE 0.96 03/18/2016    No results found for: PSA  No results found for: TESTOSTERONE  Lab Results  Component Value Date   HGBA1C 5.7 02/05/2016    Urinalysis    Component Value Date/Time   COLORURINE YELLOW (A) 03/10/2016 1913   APPEARANCEUR Cloudy (A) 03/15/2016 1451   LABSPEC 1.010 03/10/2016 1913     LABSPEC 1.005 06/07/2014 1730   PHURINE 8.0 03/10/2016 1913   GLUCOSEU Negative 03/15/2016 1451   GLUCOSEU NEGATIVE 06/07/2014 1730   HGBUR NEGATIVE 03/10/2016 1913   BILIRUBINUR Negative 03/15/2016 1451   BILIRUBINUR NEGATIVE 06/07/2014 1730   KETONESUR NEGATIVE 03/10/2016 1913   PROTEINUR Negative 03/15/2016 1451   PROTEINUR NEGATIVE 03/10/2016 1913   NITRITE Negative 03/15/2016 1451   NITRITE NEGATIVE 03/10/2016 1913   LEUKOCYTESUR Negative 03/15/2016 1451   LEUKOCYTESUR NEGATIVE 06/07/2014 1730    Pertinent Imaging: CT Hematuria    04/08/16  CC:  Chief Complaint  Patient presents with  . Cysto    HPI:  Blood pressure 126/83, pulse 79, height 5\' 5"  (1.651 m), weight 118.3 kg (260 lb 12.8 oz), last menstrual period 07/01/2013. NED. A&Ox3.   No respiratory distress   Abd soft, NT, ND Normal external genitalia with patent urethral meatus  Cystoscopy Procedure Note  Patient identification was confirmed, informed consent was obtained, and patient was prepped using Betadine solution.  Lidocaine jelly was administered per urethral meatus.    Preoperative abx where received prior to procedure.    Procedure: - Flexible cystoscope introduced, without any difficulty.   - Thorough search of the bladder revealed:    normal urethral meatus    normal urothelium    no stones    no ulcers     no tumors    no urethral polyps    no trabeculation  - Ureteral orifices were normal in position and appearance.  Post-Procedure: - Patient tolerated the procedure well  Assessment/ Plan:    Nicolette Bang, MD   Assessment & Plan:    1. Microhematuria -UA in 6 months - Urinalysis, Complete -  lidocaine (XYLOCAINE) 2 % jelly 1 application; Place 1 application into the urethra once. - ciprofloxacin (CIPRO) tablet 500 mg; Take 1 tablet (500 mg total) by mouth once.  2. Pelvic floor dysfunction -continue valium, pt defers PT -RTC 3 months   No Follow-up on  file.  Nicolette Bang, MD  [facility] [address] [place], [place] [address] [phone]

## 2016-04-14 ENCOUNTER — Ambulatory Visit: Payer: BC Managed Care – PPO

## 2016-04-14 ENCOUNTER — Ambulatory Visit: Payer: BC Managed Care – PPO | Admitting: Family Medicine

## 2016-04-14 ENCOUNTER — Other Ambulatory Visit: Payer: BC Managed Care – PPO
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
## 2016-04-24 ENCOUNTER — Other Ambulatory Visit: Payer: Self-pay | Admitting: Family Medicine

## 2016-04-24 DIAGNOSIS — E8849 Other mitochondrial metabolism disorders: Secondary | ICD-10-CM | POA: Insufficient documentation

## 2016-04-24 DIAGNOSIS — M352 Behcet's disease: Secondary | ICD-10-CM | POA: Insufficient documentation

## 2016-04-25 ENCOUNTER — Other Ambulatory Visit: Payer: Self-pay | Admitting: Family Medicine

## 2016-04-25 MED ORDER — TRAMADOL HCL 50 MG PO TABS: 50.0000 mg | ORAL_TABLET | Freq: Two times a day (BID) | ORAL | 0 refills | Status: DC | PRN

## 2016-05-05 ENCOUNTER — Ambulatory Visit: Payer: BC Managed Care – PPO | Admitting: Adult Health

## 2016-05-05 ENCOUNTER — Telehealth: Payer: Self-pay | Admitting: Neurology

## 2016-05-05 DIAGNOSIS — Z9989 Dependence on other enabling machines and devices: Secondary | ICD-10-CM

## 2016-05-05 DIAGNOSIS — J441 Chronic obstructive pulmonary disease with (acute) exacerbation: Secondary | ICD-10-CM

## 2016-05-05 DIAGNOSIS — R0902 Hypoxemia: Secondary | ICD-10-CM

## 2016-05-05 DIAGNOSIS — G4733 Obstructive sleep apnea (adult) (pediatric): Secondary | ICD-10-CM

## 2016-05-05 DIAGNOSIS — Z9981 Dependence on supplemental oxygen: Principal | ICD-10-CM

## 2016-05-05 NOTE — Telephone Encounter (Signed)
LM with recommendations below

## 2016-05-05 NOTE — Telephone Encounter (Signed)
Shirley Rogers underwent a overnight pulse oximetry on CPAP, room air on the force of October 2017 which showed significant and prolonged hypoxemia. To qualify for additional oxygen this patient will either have to return to the sleep lab for a new CPAP titration this oxygen or to be referred to pulmonology. I have decided to order a repeat titration on CPAP to oxygen. Should the patient not want to return for this test she will be referred to pulmonology.

## 2016-05-19 ENCOUNTER — Ambulatory Visit (INDEPENDENT_AMBULATORY_CARE_PROVIDER_SITE_OTHER): Payer: BC Managed Care – PPO | Admitting: Family Medicine

## 2016-05-19 ENCOUNTER — Emergency Department: Payer: BC Managed Care – PPO

## 2016-05-19 ENCOUNTER — Encounter: Payer: Self-pay | Admitting: *Deleted

## 2016-05-19 ENCOUNTER — Encounter: Payer: Self-pay | Admitting: Family Medicine

## 2016-05-19 ENCOUNTER — Emergency Department
Admission: EM | Admit: 2016-05-19 | Discharge: 2016-05-19 | Disposition: A | Discharge status: Home / Self Care | Payer: BC Managed Care – PPO | Attending: Emergency Medicine | Admitting: Emergency Medicine

## 2016-05-19 VITALS — BP 133/89 | HR 84 | Temp 98.2°F | Wt 256.8 lb

## 2016-05-19 DIAGNOSIS — R0602 Shortness of breath: Secondary | ICD-10-CM

## 2016-05-19 DIAGNOSIS — R079 Chest pain, unspecified: Secondary | ICD-10-CM | POA: Insufficient documentation

## 2016-05-19 DIAGNOSIS — R0600 Dyspnea, unspecified: Secondary | ICD-10-CM

## 2016-05-19 DIAGNOSIS — I10 Essential (primary) hypertension: Secondary | ICD-10-CM | POA: Insufficient documentation

## 2016-05-19 DIAGNOSIS — Z87891 Personal history of nicotine dependence: Secondary | ICD-10-CM | POA: Insufficient documentation

## 2016-05-19 DIAGNOSIS — Z79899 Other long term (current) drug therapy: Secondary | ICD-10-CM | POA: Diagnosis not present

## 2016-05-19 DIAGNOSIS — R05 Cough: Secondary | ICD-10-CM | POA: Insufficient documentation

## 2016-05-19 LAB — CBC
HEMATOCRIT: 40.6 % (ref 35.0–47.0)
Hemoglobin: 13.9 g/dL (ref 12.0–16.0)
MCH: 30.7 pg (ref 26.0–34.0)
MCHC: 34.2 g/dL (ref 32.0–36.0)
MCV: 89.8 fL (ref 80.0–100.0)
Platelets: 232 10*3/uL (ref 150–440)
RBC: 4.52 MIL/uL (ref 3.80–5.20)
RDW: 13.7 % (ref 11.5–14.5)
WBC: 6.5 10*3/uL (ref 3.6–11.0)

## 2016-05-19 LAB — BASIC METABOLIC PANEL WITH GFR
Anion gap: 6 (ref 5–15)
BUN: 15 mg/dL (ref 6–20)
CO2: 27 mmol/L (ref 22–32)
Calcium: 9.4 mg/dL (ref 8.9–10.3)
Chloride: 107 mmol/L (ref 101–111)
Creatinine, Ser: 1.03 mg/dL — ABNORMAL HIGH (ref 0.44–1.00)
GFR calc Af Amer: >60 mL/min
GFR calc non Af Amer: >60 mL/min
Glucose, Bld: 103 mg/dL — ABNORMAL HIGH (ref 65–99)
Potassium: 4 mmol/L (ref 3.5–5.1)
Sodium: 140 mmol/L (ref 135–145)

## 2016-05-19 LAB — VERITOR FLU A/B WAIVED
Influenza A: NEGATIVE
Influenza B: NEGATIVE

## 2016-05-19 LAB — TROPONIN I: Troponin I: <0.03 ng/mL

## 2016-05-19 LAB — FIBRIN DERIVATIVES D-DIMER (ARMC ONLY): Fibrin derivatives D-dimer (ARMC): 355 (ref 0–499)

## 2016-05-19 NOTE — ED Provider Notes (Signed)
[facility] Emergency Department Provider Note  ____________________________________________   None    (approximate)  I have reviewed the triage vital signs and the nursing notes.   HISTORY  Chief Complaint Shortness of Breath and Cough    HPI Shirley Rogers is a 50 y.o. 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  She reports that she has been compliant with her Eliquis.  She has felt increasingly short of breath particularly with exertion over the last week.  She has also had some pleuritic chest pain, worse in certain positions and with deep breaths and which feels sharp, as well as some mild lower extremity swelling/pitting edema.  She denies fever/chills, nausea, vomiting, abdominal pain, dysuria.  She was seen today by her primary care doctor who sent her here for further evaluation.   Past Medical History:  Diagnosis Date  . Anxiety    on xanax in the past  . ASCVD (arteriosclerotic cardiovascular disease)   . Barrett esophagus   . 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 Carotid atherosclerosis 2003   on ultrasound  . Collagen vascular disease ([place])   . Colon polyp   . Complicated migraine    with facial drooping  . Concussion    1974,1982,1987,2016,2016  . DDD (degenerative disc disease), lumbar    MRI 2008, L3-4, L5-S1, spondylotic changes, multilevel facet joint hypertrophic changes  . Depression    on paxil, lexapro, cymbalta in the past  . Diverticulosis   . Dizziness due to old head injury   . Family history of adverse reaction to anesthesia    father - slow to wake  . Fatty liver 2013   On CT abdomen/pelvis  . Fibrocystic breast disease   . GERD (gastroesophageal reflux  disease)   . Hemiplegic migraine    daily  . Hemorrhoids   . Hyperlipidemia   . Hypertension    no meds currently.  . IBS (irritable bowel syndrome)   . Insomnia chronic  . Kidney stone 2013   on CT abdomen/pelvis  . 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 Migraine   . Narrowing of intervertebral disc space 2011   C5-6  . Normal cardiac stress test 2009   EF 66%  . Pulmonary emboli ([place])   . Recurrent sinusitis   . Recurrent UTI    question about IC in the past  . Sleep apnea    CPAP machine broken  . Stroke ([place])    2000 - no deficits  . Umbilical hernia 0000000   on CT abdomen/pelvis  . Varicose veins    right lower leg  . Vascular spasm ([place])   . Vertigo    from concussive disorder.  seeing neuro 03/23/15    Patient Active Problem List   Diagnosis Date Noted  . 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 Behcet's disease ([place]) 04/24/2016  . Microhematuria 03/15/2016  . Pelvic floor dysfunction 03/15/2016  . Urethral caruncle 03/15/2016  . Pulmonary embolus ([place]) 03/11/2016  . Facial droop 02/04/2016  . Right hemiparesis ([place]) 02/04/2016  . Tobacco use 12/03/2015  . ANA positive 10/19/2015  . Transient alteration of awareness 09/24/2015  . Snoring 06/10/2015  .  Sleep apnea 06/10/2015  . 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 Globus sensation 03/30/2015  . Post concussion syndrome 03/13/2015  . Depression 03/13/2015  . Benign hypertensive renal disease   . Barrett esophagus   . Diverticulosis   . Hemiplegic migraine   . Lupus   . Stroke ([place])   . Fall 05/25/2014    Past Surgical History:  Procedure Laterality Date  . Biopsy Punch Thyroid    . BREAST EXCISIONAL BIOPSY Right 2000   right mass excision - atypical hyperplasia  . BREAST SURGERY Right    atypical hyperplasia  . CHOLECYSTECTOMY N/A 07/24/2015   Procedure: LAPAROSCOPIC  CHOLECYSTECTOMY WITH INTRAOPERATIVE CHOLANGIOGRAM;  Surgeon: Leonie Green, MD;  Location: [facility] ORS;  Service: General;  Laterality: N/A;  . COLONOSCOPY W/ BIOPSIES     Removed 5 polyps  . TUBAL LIGATION      Prior to Admission medications   Medication Sig Start Date End Date Taking? Authorizing Provider  albuterol (PROVENTIL HFA;VENTOLIN HFA) 108 (90 Base) MCG/ACT inhaler Inhale 2 puffs into the lungs every 4 (four) hours as needed for wheezing or shortness of breath.    Historical Provider, MD  apixaban (ELIQUIS) 5 MG TABS tablet Take 1 tablet (5 mg total) by mouth 2 (two) times daily. 03/18/16   Megan P Johnson, DO  azaTHIOprine (IMURAN) 50 MG tablet Take 50 mg by mouth 2 (two) times daily. 04/25/16   Historical Provider, MD  b complex vitamins tablet Take 1 tablet by mouth daily.    Historical Provider, MD  busPIRone (BUSPAR) 5 MG tablet Take 15 mg by mouth daily.    Historical Provider, MD  citalopram (CELEXA) 40 MG tablet Take 1.5 tablets (60 mg total) by mouth daily. 02/19/16   Megan P Johnson, DO  diazepam (VALIUM) 5 MG tablet Take 1 tablet (5 mg total) by mouth every 6 (six) hours as needed for anxiety. 04/08/16   Cleon Gustin, MD  diphenhydrAMINE (BENADRYL) 25 mg capsule Take 2 capsules (50 mg total) by mouth every 6 (six) hours as needed. 02/19/16   Megan Annia Friendly, DO  Elastic Bandages & Supports (T.E.D. THIGH LENGTH/L-REGULAR) MISC 2 application by Does not apply route every morning. 03/18/16   Merlyn Lot, MD  EPINEPHrine (EPIPEN 2-PAK) 0.3 mg/0.3 mL IJ SOAJ injection Inject 0.3 mLs (0.3 mg total) into the muscle once. 09/24/15   Megan P Johnson, DO  furosemide (LASIX) 20 MG tablet Take 20 mg by mouth daily.    Historical Provider, MD  gabapentin (NEURONTIN) 300 MG capsule Take by mouth. 03/02/16 08/29/16  Historical Provider, MD  hydrochlorothiazide (HYDRODIURIL) 25 MG tablet Take 25 mg by mouth daily.    Historical Provider, MD  magnesium oxide (MAG-OX) 400 MG tablet  Take 400 mg by mouth daily. Reported on 05/14/2015    Historical Provider, MD  metoCLOPramide (REGLAN) 10 MG tablet Take 1 tablet (10 mg total) by mouth 4 (four) times daily -  before meals and at bedtime. Patient not taking: Reported on 05/19/2016 12/14/15   Carrie Mew, MD  omeprazole (PRILOSEC) 40 MG capsule Take 40 mg by mouth daily.    Historical Provider, MD  predniSONE (DELTASONE) 20 MG tablet Take 2 tablets (40 mg total) by mouth daily with breakfast. Patient taking differently: Take 40 mg by mouth daily with breakfast. Takes every other day 03/31/16   Volney American, PA-C  topiramate (TOPAMAX) 200 MG tablet Take 200 mg by mouth  daily. 05/09/16   Historical Provider, MD  traMADol (ULTRAM) 50 MG tablet Take 1 tablet (50 mg total) by mouth 2 (two) times daily as needed. 04/25/16   Megan P Johnson, DO  traZODone (DESYREL) 100 MG tablet TAKE 1 TABLET(100 MG) BY MOUTH AT BEDTIME AS NEEDED FOR SLEEP 03/25/16   Megan P Johnson, DO  verapamil (CALAN-SR) 240 MG CR tablet Take 240 mg by mouth daily.  12/03/15   Historical Provider, MD    Allergies Rocephin [ceftriaxone]; Statins; Compazine [prochlorperazine]; Doxycycline; Erythromycin; Sulfa antibiotics; Meloxicam; Sulfamethoxazole-trimethoprim; Ace inhibitors; Biaxin [clarithromycin]; Latex; and Morphine and related  Family History  Problem Relation Age of Onset  . Other Mother     IBS  . Hypertension Mother   . Heart disease Father   . Diverticulosis Father   . Migraines Father   . Other Son     Ulcerative Colitis, Polonidal cyst  . Parkinson's disease Maternal Grandmother   . Cancer Maternal Grandmother   . Mental illness Paternal Grandmother   . Stroke Paternal Grandmother   . Cancer Paternal Grandmother     brain cancer  . Bipolar disorder Paternal Grandmother   . Stroke Maternal Grandfather   . Cancer Maternal Grandfather     Bladder, Prostate/bladder/prostate  . Heart disease Paternal Grandfather   . Heart attack  Paternal Grandfather   . Macular degeneration    . Diabetes Brother   . Breast cancer Maternal Aunt     Social History Social History  Substance Use Topics  . Smoking status: Former Smoker    Packs/day: 0.25    Years: 1.00    Types: Cigarettes    Quit date: 06/12/2015  . Smokeless tobacco: Never Used     Comment: 1-2 cigarettes approx 3 x/wk  . Alcohol use No     Comment: social    Review of Systems Constitutional: No fever/chills, general malaise, generalized body aches, for about a week Eyes: No visual changes. ENT: Runny nose, congestion Cardiovascular: Pleuritic chest pain. Respiratory: shortness of breath especially with exertion Gastrointestinal: No abdominal pain.  No nausea, no vomiting.  No diarrhea.  No constipation. Genitourinary: Negative for dysuria. Musculoskeletal: Negative for back pain. Skin: Negative for rash. Neurological: Negative for headaches, focal weakness or numbness.  10-point ROS otherwise negative.  ____________________________________________   PHYSICAL EXAM:  VITAL SIGNS: ED Triage Vitals  Enc Vitals Group     BP 05/19/16 1542 (!) 144/90     Pulse Rate 05/19/16 1542 74     Resp 05/19/16 1542 20     Temp 05/19/16 1542 98.4 F (36.9 C)     Temp Source 05/19/16 1542 Oral     SpO2 05/19/16 1542 100 %     Weight 05/19/16 1543 256 lb (116.1 kg)     Height 05/19/16 1543 5\' 5"  (1.651 m)     Head Circumference --      Peak Flow --      Pain Score 05/19/16 1544 7     Pain Loc --      Pain Edu? --      Excl. in [place]? --     Constitutional: Alert and oriented. Well appearing and in no acute distress. Eyes: Conjunctivae are normal. PERRL. EOMI. Head: Atraumatic. Nose: No congestion/rhinnorhea. Mouth/Throat: Mucous membranes are moist.  Oropharynx non-erythematous. Neck: No stridor.  No meningeal signs.   Cardiovascular: Normal rate, regular rhythm. Good peripheral circulation. Grossly normal heart sounds. Respiratory: Normal respiratory  effort.  No retractions. Lungs CTAB. Gastrointestinal: Obese,  Soft and nontender. No distention.  Musculoskeletal: No lower extremity tenderness nor edema. No gross deformities of extremities. Neurologic:  Normal speech and language. No gross focal neurologic deficits are appreciated.  Skin:  Skin is warm, dry and intact. No rash noted. Psychiatric: Mood and affect are normal. Speech and behavior are normal.  ____________________________________________   LABS (all labs ordered are listed, but only abnormal results are displayed)  Labs Reviewed  BASIC METABOLIC PANEL - Abnormal; Notable for the following:       Result Value   Glucose, Bld 103 (*)    Creatinine, Ser 1.03 (*)    All other components within normal limits  CBC  TROPONIN I  FIBRIN DERIVATIVES D-DIMER ([facility] ONLY)   ____________________________________________  EKG  ED ECG REPORT I, Rainey Rodger, the attending physician, personally viewed and interpreted this ECG.  Date: 05/19/2016 EKG Time: 15:47 Rate: 75 Rhythm: normal sinus rhythm with short PR interval of 104 ms QRS Axis: normal Intervals: normal ST/T Wave abnormalities: Non-specific ST segment / T-wave changes, but no evidence of acute ischemia. Conduction Disturbances: none Narrative Interpretation: unremarkable  ____________________________________________  RADIOLOGY   Dg Chest 2 View  Result Date: 05/19/2016 CLINICAL DATA:  Shortness of breath . EXAM: CHEST  2 VIEW COMPARISON:  03/18/2016. FINDINGS: Mediastinum and hilar structures are normal. Heart size normal. No acute infiltrate. Mild left base subsegmental atelectasis and/or scarring. No pleural effusion or pneumothorax. IMPRESSION: Mild left base subsegmental atelectasis and/or scarring. Electronically Signed   By: Marcello Moores  Register   On: 05/19/2016 16:36    ____________________________________________   PROCEDURES  Procedure(s) performed:   Procedures   Critical Care performed:  No ____________________________________________   INITIAL IMPRESSION / ASSESSMENT AND PLAN / ED COURSE  Pertinent labs & imaging results that were available during my care of the patient were reviewed by me and considered in my medical decision making (see chart for details).  A d-dimer was ordered on this patient in triage.  It is difficult to assess whether or not this will be a sufficient negative rule out.  I strongly suspect that it will be elevated given the patient's rheumatological disorder and comorbid medical issues.  However if it is negative it may save her a CT scan.  However I also am not sure that in this patient with a significantly elevated pretest probability that it is a sufficient rule out.  We agreed that we would see with the result is and then proceed appropriately or discuss additional imaging even if it is within normal limits.  She is in no acute distress at this time and is hemodynamically stable.  Suspects that a viral illness or flulike symptoms more likely.  She had negative rapid flu testing as an outpatient and we have had some also negative rapid flu results in the community, but the patient has had symptoms for at least 5 days and there is no real benefit in doing PCR testing because she would not benefit from the already questionable efficacy of Tamiflu.   Clinical Course as of May 20 2007  Thu May 19, 2016  2005 The patient's d-dimer is within normal limits.  She has been asymptomatic since coming to the emergency department.  We had another long discussion about the significance of relevance of the negative d-dimer especially given that she has a number of reasons that it should be elevated.  She is on Eliquis which is reassuring.  She states that she thinks she has a viral illness and just  has overdone it.  I gave my usual and customary return precautions, and she knows to come back if she gets worse or develops new or worsening symptoms. Fibrin derivatives  D-dimer [facility]): 355 [CF]    Clinical Course User Index [CF] Hinda Kehr, MD    ____________________________________________  FINAL CLINICAL IMPRESSION(S) / ED DIAGNOSES  Final diagnoses:  Dyspnea, unspecified type     MEDICATIONS GIVEN DURING THIS VISIT:  Medications - No data to display   NEW OUTPATIENT MEDICATIONS STARTED DURING THIS VISIT:  New Prescriptions   No medications on file    Modified Medications   No medications on file    Discontinued Medications   No medications on file     Note:  This document was prepared using Dragon voice recognition software and may include unintentional dictation errors.    Hinda Kehr, MD 05/19/16 2008

## 2016-05-19 NOTE — Discharge Instructions (Signed)
Your emergency department workup was quite reassuring today.  As we discussed, your d-dimer was negative which is extremely reassuring that you do not have an acute pulmonary embolism.  We believe you are most likely suffering from a viral illness similar to the flu which is making you feel ill and more worn out than usual.  Please continue all if your regular medications including your Eliquis and follow-up as an outpatient.  Return to the emergency department if you develop new or worsening symptoms that concern you.

## 2016-05-19 NOTE — ED Triage Notes (Signed)
Pt ambulatory to triage.  Pt reports sob and chest pain.  Pt was seen by dr today and had negative flu test.  Pt was sent to er for eval of PE.  Hx of PE.  Pt alert.

## 2016-05-19 NOTE — Progress Notes (Signed)
BP 133/89 (BP Location: Left Arm, Patient Position: Sitting, Cuff Size: Large)   Pulse 84   Temp 98.2 F (36.8 C)   Wt 256 lb 12.8 oz (116.5 kg)   LMP 07/01/2013   SpO2 93%   BMI 42.73 kg/m    Subjective:    Patient ID: Shirley Rogers, female    DOB: 06/09/1965, 50 y.o.   MRN: [id number]  HPI: Shirley Rogers is a 50 y.o. female  Chief Complaint  Patient presents with  . URI    pt states she has had chest tightness, headache, swollen throat, lathargic, chills, achy, and some vomiting  . Medication Refill    pt states she needs a refill on buspirone and reglan    Patient presents with 4-5 days of chills, aches, fatigue, HA, sore throat, vomiting, anorexia, sharp pains in chest that worsen with cough, chest tightness. Fever started last night, was 100.6 or so when measured. Denies N/V, hemoptysis, wheezing. Has been using albuterol frequently for her SOB. Does have recent hx of PE 2 months ago, now on eliquis. 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   Relevant past medical, surgical, family and social history reviewed and updated as indicated. Interim medical history since our last visit reviewed. Allergies and medications reviewed and updated.  Review of Systems  Constitutional: Positive for chills, diaphoresis, fatigue and fever.  HENT: Positive for congestion and sore throat.   Eyes: Negative.   Respiratory: Positive for chest tightness and shortness of breath.   Cardiovascular: Positive for chest pain.  Gastrointestinal: Negative.   Genitourinary: Negative.   Musculoskeletal: Positive for myalgias.  Neurological: Negative.   Psychiatric/Behavioral: Negative.     Per HPI unless specifically indicated above     Objective:    BP 133/89 (BP Location: Left Arm, Patient Position: Sitting, Cuff Size: Large)   Pulse 84   Temp 98.2 F (36.8 C)   Wt 256 lb 12.8 oz (116.5 kg)   LMP 07/01/2013   SpO2  93%   BMI 42.73 kg/m   Wt Readings from Last 3 Encounters:  05/19/16 256 lb (116.1 kg)  05/19/16 256 lb 12.8 oz (116.5 kg)  04/08/16 260 lb 12.8 oz (118.3 kg)    Physical Exam  Constitutional: She is oriented to person, place, and time. She appears well-developed and well-nourished. No distress.  HENT:  Head: Atraumatic.  Eyes: Conjunctivae are normal. Pupils are equal, round, and reactive to light. No scleral icterus.  Neck: Normal range of motion. Neck supple.  Cardiovascular: Normal rate and normal heart sounds.   Pulmonary/Chest: Effort normal. No respiratory distress. She has no wheezes. She has no rales.  Musculoskeletal: Normal range of motion.  Neurological: She is alert and oriented to person, place, and time.  Skin: Skin is warm and dry.  Psychiatric: She has a normal mood and affect. Her behavior is normal.  Nursing note and vitals reviewed.     Assessment & Plan:   Problem List Items Addressed This Visit    None    Visit Diagnoses    SOB (shortness of breath)    -  Primary   Suspect sxs related to viral illness, but given complicated hx, decreased O2 sat, sharp CP recommending she go to ER for further workup   Relevant Orders   Veritor Flu A/B Waived (Completed)    Rapid flu negative. Lungs benign on exam but given recent PE may need further eval to  r/o pulmonary pathology causing sharp CP and SOB.    Follow up plan: Return if symptoms worsen or fail to improve.

## 2016-05-21 ENCOUNTER — Other Ambulatory Visit: Payer: Self-pay | Admitting: Family Medicine

## 2016-05-21 ENCOUNTER — Other Ambulatory Visit: Payer: Self-pay | Admitting: Diagnostic Neuroimaging

## 2016-05-25 NOTE — Telephone Encounter (Signed)
LVM requesting call back for clarification on refill of Verapamil. Left name, number.

## 2016-05-31 ENCOUNTER — Encounter: Payer: Self-pay | Admitting: Family Medicine

## 2016-05-31 ENCOUNTER — Ambulatory Visit
Admission: RE | Admit: 2016-05-31 | Discharge: 2016-05-31 | Disposition: A | Discharge status: Home / Self Care | Payer: BC Managed Care – PPO | Source: Ambulatory Visit | Attending: Family Medicine | Admitting: Family Medicine

## 2016-05-31 ENCOUNTER — Ambulatory Visit (INDEPENDENT_AMBULATORY_CARE_PROVIDER_SITE_OTHER): Payer: BC Managed Care – PPO | Admitting: Family Medicine

## 2016-05-31 VITALS — BP 136/91 | HR 86 | Temp 98.2°F | Wt 255.9 lb

## 2016-05-31 DIAGNOSIS — J189 Pneumonia, unspecified organism: Secondary | ICD-10-CM | POA: Diagnosis present

## 2016-05-31 MED ORDER — HYDROCOD POLST-CPM POLST ER 10-8 MG/5ML PO SUER: 5.0000 mL | Freq: Every evening | ORAL | 0 refills | Status: DC | PRN

## 2016-05-31 MED ORDER — BUSPIRONE HCL 5 MG PO TABS: 15.0000 mg | ORAL_TABLET | Freq: Every day | ORAL | 1 refills | Status: DC

## 2016-05-31 MED ORDER — BENZONATATE 200 MG PO CAPS: 200.0000 mg | ORAL_CAPSULE | Freq: Two times a day (BID) | ORAL | 0 refills | Status: DC | PRN

## 2016-05-31 MED ORDER — LEVOFLOXACIN 750 MG PO TABS: 750.0000 mg | ORAL_TABLET | Freq: Every day | ORAL | 0 refills | Status: DC

## 2016-05-31 NOTE — Progress Notes (Signed)
BP (!) 136/91 (BP Location: Left Arm, Patient Position: Sitting, Cuff Size: Large)   Pulse 86   Temp 98.2 F (36.8 C)   Wt 255 lb 14.4 oz (116.1 kg)   LMP 07/01/2013   SpO2 97%   BMI 42.58 kg/m    Subjective:    Patient ID: Shirley Rogers, female    DOB: July 29, 1965, 51 y.o.   MRN: [id number]  HPI: Shirley Rogers is a 51 y.o. female  Chief Complaint  Patient presents with  . Cough  . Medication Refill    Buspar   UPPER RESPIRATORY TRACT INFECTION Duration: 10 days Worst symptom: cough Fever: yes Cough: yes Shortness of breath: yes Wheezing: yes Chest pain: yes, with cough Chest tightness: yes Chest congestion: yes Nasal congestion: no Runny nose: no Post nasal drip: no Sneezing: no Sore throat: no Swollen glands: no Sinus pressure: no Headache: yes Face pain: no Toothache: no Ear pain: yes- R  Ear pressure: no  Eyes red/itching:no Eye drainage/crusting: no  Vomiting: yes Rash: no Fatigue: yes Sick contacts: yes Strep contacts: no  Context: worse Recurrent sinusitis: no Relief with OTC cold/cough medications: no  Treatments attempted: Corecitin   Relevant past medical, surgical, family and social history reviewed and updated as indicated. Interim medical history since our last visit reviewed. Allergies and medications reviewed and updated.  Review of Systems  Constitutional: Positive for chills, fatigue and fever. Negative for activity change, appetite change, diaphoresis and unexpected weight change.  HENT: Positive for congestion, postnasal drip, rhinorrhea, sinus pain and sinus pressure. Negative for dental problem, drooling, ear discharge, ear pain, facial swelling, hearing loss, mouth sores, nosebleeds, sneezing, sore throat, tinnitus, trouble swallowing and voice change.   Respiratory: Positive for cough, chest tightness, shortness of breath and wheezing. Negative for apnea, choking and stridor.   Cardiovascular: Positive for chest pain. Negative  for palpitations and leg swelling.  Gastrointestinal: Positive for vomiting.  Psychiatric/Behavioral: Negative.     Per HPI unless specifically indicated above     Objective:    BP (!) 136/91 (BP Location: Left Arm, Patient Position: Sitting, Cuff Size: Large)   Pulse 86   Temp 98.2 F (36.8 C)   Wt 255 lb 14.4 oz (116.1 kg)   LMP 07/01/2013   SpO2 97%   BMI 42.58 kg/m   Wt Readings from Last 3 Encounters:  05/31/16 255 lb 14.4 oz (116.1 kg)  05/19/16 256 lb (116.1 kg)  05/19/16 256 lb 12.8 oz (116.5 kg)    Physical Exam  Constitutional: She is oriented to person, place, and time. She appears well-developed and well-nourished. No distress.  HENT:  Head: Normocephalic and atraumatic.  Right Ear: Hearing normal.  Left Ear: Hearing normal.  Nose: Nose normal.  Eyes: Conjunctivae and lids are normal. Right eye exhibits no discharge. Left eye exhibits no discharge. No scleral icterus.  Cardiovascular: Normal rate, regular rhythm, normal heart sounds and intact distal pulses.  Exam reveals no gallop and no friction rub.   No murmur heard. Pulmonary/Chest: Effort normal. No respiratory distress. She has decreased breath sounds in the right upper field, the right middle field, the right lower field, the left upper field and the left lower field. She has no wheezes. She has rhonchi in the left lower field. She has no rales. She exhibits no tenderness.  Musculoskeletal: Normal range of motion.  Neurological: She is alert and oriented to person, place, and time.  Skin: Skin is intact. No rash noted. She is not  diaphoretic.  Psychiatric: She has a normal mood and affect. Her speech is normal and behavior is normal. Judgment and thought content normal. Cognition and memory are normal.  Vitals reviewed.   Results for orders placed or performed during the hospital encounter of [id number]  Basic metabolic panel  Result Value Ref Range   Sodium 140 135 - 145 mmol/L   Potassium 4.0 3.5 -  5.1 mmol/L   Chloride 107 101 - 111 mmol/L   CO2 27 22 - 32 mmol/L   Glucose, Bld 103 (H) 65 - 99 mg/dL   BUN 15 6 - 20 mg/dL   Creatinine, Ser 1.03 (H) 0.44 - 1.00 mg/dL   Calcium 9.4 8.9 - 10.3 mg/dL   GFR calc non Af Amer >60 >60 mL/min   GFR calc Af Amer >60 >60 mL/min   Anion gap 6 5 - 15  CBC  Result Value Ref Range   WBC 6.5 3.6 - 11.0 K/uL   RBC 4.52 3.80 - 5.20 MIL/uL   Hemoglobin 13.9 12.0 - 16.0 g/dL   HCT 40.6 35.0 - 47.0 %   MCV 89.8 80.0 - 100.0 fL   MCH 30.7 26.0 - 34.0 pg   MCHC 34.2 32.0 - 36.0 g/dL   RDW 13.7 11.5 - 14.5 %   Platelets 232 150 - 440 K/uL  Troponin I  Result Value Ref Range   Troponin I <0.03 <0.03 ng/mL  Fibrin derivatives D-Dimer ([facility] only)  Result Value Ref Range   Fibrin derivatives D-dimer ([facility]) 355 0 - 499      Assessment & Plan:   Problem List Items Addressed This Visit    None    Visit Diagnoses    Pneumonia of right lung due to infectious organism, unspecified part of lung    -  Primary   Will treat with levaquin due to allergies. Tussionex and tessalon perles for comfort. Call with any concerns. Recheck 1 week. Await x-ray.   Relevant Medications   levofloxacin (LEVAQUIN) 750 MG tablet   benzonatate (TESSALON) 200 MG capsule   chlorpheniramine-HYDROcodone (TUSSIONEX PENNKINETIC ER) 10-8 MG/5ML SUER   Other Relevant Orders   DG Chest 2 View       Follow up plan: Return in about 1 week (around 06/07/2016), or Lung recheck.

## 2016-06-05 ENCOUNTER — Ambulatory Visit (INDEPENDENT_AMBULATORY_CARE_PROVIDER_SITE_OTHER): Payer: BC Managed Care – PPO | Admitting: Neurology

## 2016-06-05 DIAGNOSIS — G4733 Obstructive sleep apnea (adult) (pediatric): Secondary | ICD-10-CM

## 2016-06-05 DIAGNOSIS — Z9989 Dependence on other enabling machines and devices: Secondary | ICD-10-CM

## 2016-06-05 DIAGNOSIS — Z9981 Dependence on supplemental oxygen: Principal | ICD-10-CM

## 2016-06-05 DIAGNOSIS — R0902 Hypoxemia: Secondary | ICD-10-CM

## 2016-06-05 DIAGNOSIS — J441 Chronic obstructive pulmonary disease with (acute) exacerbation: Secondary | ICD-10-CM

## 2016-06-06 ENCOUNTER — Other Ambulatory Visit: Payer: Self-pay | Admitting: Family Medicine

## 2016-06-10 ENCOUNTER — Inpatient Hospital Stay: Payer: BC Managed Care – PPO

## 2016-06-10 ENCOUNTER — Emergency Department: Payer: BC Managed Care – PPO

## 2016-06-10 ENCOUNTER — Ambulatory Visit: Payer: BC Managed Care – PPO | Admitting: Family Medicine

## 2016-06-10 ENCOUNTER — Observation Stay
Admission: EM | Admit: 2016-06-10 | Discharge: 2016-06-11 | Disposition: A | Discharge status: Home / Self Care | Payer: BC Managed Care – PPO | Attending: Internal Medicine | Admitting: Internal Medicine

## 2016-06-10 ENCOUNTER — Encounter: Payer: Self-pay | Admitting: Emergency Medicine

## 2016-06-10 DIAGNOSIS — Z86718 Personal history of other venous thrombosis and embolism: Secondary | ICD-10-CM | POA: Diagnosis not present

## 2016-06-10 DIAGNOSIS — M5136 Other intervertebral disc degeneration, lumbar region: Secondary | ICD-10-CM | POA: Diagnosis not present

## 2016-06-10 DIAGNOSIS — K760 Fatty (change of) liver, not elsewhere classified: Secondary | ICD-10-CM | POA: Insufficient documentation

## 2016-06-10 DIAGNOSIS — K219 Gastro-esophageal reflux disease without esophagitis: Secondary | ICD-10-CM | POA: Diagnosis not present

## 2016-06-10 DIAGNOSIS — F419 Anxiety disorder, unspecified: Secondary | ICD-10-CM | POA: Insufficient documentation

## 2016-06-10 DIAGNOSIS — G8191 Hemiplegia, unspecified affecting right dominant side: Secondary | ICD-10-CM

## 2016-06-10 DIAGNOSIS — M50322 Other cervical disc degeneration at C5-C6 level: Secondary | ICD-10-CM | POA: Diagnosis not present

## 2016-06-10 DIAGNOSIS — R2681 Unsteadiness on feet: Secondary | ICD-10-CM

## 2016-06-10 DIAGNOSIS — I2510 Atherosclerotic heart disease of native coronary artery without angina pectoris: Secondary | ICD-10-CM | POA: Insufficient documentation

## 2016-06-10 DIAGNOSIS — I6529 Occlusion and stenosis of unspecified carotid artery: Secondary | ICD-10-CM | POA: Insufficient documentation

## 2016-06-10 DIAGNOSIS — M352 Behcet's disease: Secondary | ICD-10-CM | POA: Insufficient documentation

## 2016-06-10 DIAGNOSIS — M329 Systemic lupus erythematosus, unspecified: Secondary | ICD-10-CM | POA: Insufficient documentation

## 2016-06-10 DIAGNOSIS — R531 Weakness: Secondary | ICD-10-CM | POA: Diagnosis not present

## 2016-06-10 DIAGNOSIS — G4700 Insomnia, unspecified: Secondary | ICD-10-CM | POA: Diagnosis not present

## 2016-06-10 DIAGNOSIS — I998 Other disorder of circulatory system: Secondary | ICD-10-CM | POA: Insufficient documentation

## 2016-06-10 DIAGNOSIS — R29810 Facial weakness: Principal | ICD-10-CM | POA: Insufficient documentation

## 2016-06-10 DIAGNOSIS — Z8673 Personal history of transient ischemic attack (TIA), and cerebral infarction without residual deficits: Secondary | ICD-10-CM | POA: Insufficient documentation

## 2016-06-10 DIAGNOSIS — F329 Major depressive disorder, single episode, unspecified: Secondary | ICD-10-CM | POA: Diagnosis not present

## 2016-06-10 DIAGNOSIS — G43409 Hemiplegic migraine, not intractable, without status migrainosus: Secondary | ICD-10-CM | POA: Diagnosis not present

## 2016-06-10 DIAGNOSIS — G4730 Sleep apnea, unspecified: Secondary | ICD-10-CM | POA: Insufficient documentation

## 2016-06-10 DIAGNOSIS — I10 Essential (primary) hypertension: Secondary | ICD-10-CM | POA: Insufficient documentation

## 2016-06-10 DIAGNOSIS — I639 Cerebral infarction, unspecified: Secondary | ICD-10-CM

## 2016-06-10 DIAGNOSIS — Z881 Allergy status to other antibiotic agents status: Secondary | ICD-10-CM | POA: Insufficient documentation

## 2016-06-10 DIAGNOSIS — Z86711 Personal history of pulmonary embolism: Secondary | ICD-10-CM | POA: Insufficient documentation

## 2016-06-10 DIAGNOSIS — Z885 Allergy status to narcotic agent status: Secondary | ICD-10-CM | POA: Insufficient documentation

## 2016-06-10 DIAGNOSIS — Z91040 Latex allergy status: Secondary | ICD-10-CM | POA: Insufficient documentation

## 2016-06-10 DIAGNOSIS — Z86010 Personal history of colonic polyps: Secondary | ICD-10-CM | POA: Insufficient documentation

## 2016-06-10 DIAGNOSIS — Z882 Allergy status to sulfonamides status: Secondary | ICD-10-CM | POA: Diagnosis not present

## 2016-06-10 DIAGNOSIS — Z23 Encounter for immunization: Secondary | ICD-10-CM | POA: Diagnosis not present

## 2016-06-10 DIAGNOSIS — Z87442 Personal history of urinary calculi: Secondary | ICD-10-CM | POA: Insufficient documentation

## 2016-06-10 DIAGNOSIS — K589 Irritable bowel syndrome without diarrhea: Secondary | ICD-10-CM | POA: Diagnosis not present

## 2016-06-10 DIAGNOSIS — Z8719 Personal history of other diseases of the digestive system: Secondary | ICD-10-CM | POA: Insufficient documentation

## 2016-06-10 DIAGNOSIS — Z87440 Personal history of urinary (tract) infections: Secondary | ICD-10-CM | POA: Insufficient documentation

## 2016-06-10 DIAGNOSIS — E785 Hyperlipidemia, unspecified: Secondary | ICD-10-CM | POA: Insufficient documentation

## 2016-06-10 DIAGNOSIS — Z87891 Personal history of nicotine dependence: Secondary | ICD-10-CM | POA: Insufficient documentation

## 2016-06-10 DIAGNOSIS — R262 Difficulty in walking, not elsewhere classified: Secondary | ICD-10-CM

## 2016-06-10 DIAGNOSIS — Z888 Allergy status to other drugs, medicaments and biological substances status: Secondary | ICD-10-CM | POA: Insufficient documentation

## 2016-06-10 LAB — URINALYSIS, ROUTINE W REFLEX MICROSCOPIC
BILIRUBIN URINE: NEGATIVE
Glucose, UA: NEGATIVE mg/dL
HGB URINE DIPSTICK: NEGATIVE
Ketones, ur: NEGATIVE mg/dL
Leukocytes, UA: NEGATIVE
Nitrite: NEGATIVE
PH: 6 (ref 5.0–8.0)
Protein, ur: NEGATIVE mg/dL
SPECIFIC GRAVITY, URINE: 1.017 (ref 1.005–1.030)

## 2016-06-10 LAB — CBC
HEMATOCRIT: 39.3 % (ref 35.0–47.0)
Hemoglobin: 13.7 g/dL (ref 12.0–16.0)
MCH: 31.1 pg (ref 26.0–34.0)
MCHC: 34.9 g/dL (ref 32.0–36.0)
MCV: 89 fL (ref 80.0–100.0)
PLATELETS: 213 10*3/uL (ref 150–440)
RBC: 4.41 MIL/uL (ref 3.80–5.20)
RDW: 14 % (ref 11.5–14.5)
WBC: 5.1 10*3/uL (ref 3.6–11.0)

## 2016-06-10 LAB — DIFFERENTIAL
BASOS ABS: 0 10*3/uL (ref 0–0.1)
BASOS PCT: 1 %
Eosinophils Absolute: 0.1 10*3/uL (ref 0–0.7)
Eosinophils Relative: 2 %
Lymphocytes Relative: 23 %
Lymphs Abs: 1.1 10*3/uL (ref 1.0–3.6)
Monocytes Absolute: 0.5 10*3/uL (ref 0.2–0.9)
Monocytes Relative: 9 %
NEUTROS ABS: 3.3 10*3/uL (ref 1.4–6.5)
NEUTROS PCT: 65 %

## 2016-06-10 LAB — APTT: APTT: 28 s (ref 24–36)

## 2016-06-10 LAB — URINE DRUG SCREEN, QUALITATIVE (ARMC ONLY)
AMPHETAMINES, UR SCREEN: NOT DETECTED
Barbiturates, Ur Screen: NOT DETECTED
Benzodiazepine, Ur Scrn: POSITIVE — AB
Cannabinoid 50 Ng, Ur ~~LOC~~: POSITIVE — AB
Cocaine Metabolite,Ur ~~LOC~~: NOT DETECTED
MDMA (ECSTASY) UR SCREEN: NOT DETECTED
METHADONE SCREEN, URINE: NOT DETECTED
OPIATE, UR SCREEN: POSITIVE — AB
PHENCYCLIDINE (PCP) UR S: NOT DETECTED
Tricyclic, Ur Screen: NOT DETECTED

## 2016-06-10 LAB — COMPREHENSIVE METABOLIC PANEL
ALBUMIN: 4.5 g/dL (ref 3.5–5.0)
ALT: 20 U/L (ref 14–54)
AST: 35 U/L (ref 15–41)
Alkaline Phosphatase: 48 U/L (ref 38–126)
Anion gap: 6 (ref 5–15)
BUN: 18 mg/dL (ref 6–20)
CHLORIDE: 106 mmol/L (ref 101–111)
CO2: 26 mmol/L (ref 22–32)
CREATININE: 0.95 mg/dL (ref 0.44–1.00)
Calcium: 8.8 mg/dL — ABNORMAL LOW (ref 8.9–10.3)
GFR calc Af Amer: >60 mL/min (ref >60)
GFR calc non Af Amer: >60 mL/min (ref >60)
Glucose, Bld: 86 mg/dL (ref 65–99)
POTASSIUM: 3.8 mmol/L (ref 3.5–5.1)
SODIUM: 138 mmol/L (ref 135–145)
Total Bilirubin: 0.5 mg/dL (ref 0.3–1.2)
Total Protein: 7.4 g/dL (ref 6.5–8.1)

## 2016-06-10 LAB — GLUCOSE, CAPILLARY: GLUCOSE-CAPILLARY: 81 mg/dL (ref 65–99)

## 2016-06-10 LAB — PROTIME-INR
INR: 1.02
Prothrombin Time: 13.4 seconds (ref 11.4–15.2)

## 2016-06-10 LAB — TROPONIN I: Troponin I: <0.03 ng/mL (ref <0.03)

## 2016-06-10 LAB — ETHANOL

## 2016-06-10 MED ORDER — AZATHIOPRINE 50 MG PO TABS
50.0000 mg | ORAL_TABLET | Freq: Two times a day (BID) | ORAL | Status: DC
  Administered 2016-06-10 – 2016-06-11 (×2): 50 mg via ORAL
  Filled 2016-06-10 (×2): qty 1

## 2016-06-10 MED ORDER — IOPAMIDOL (ISOVUE-370) INJECTION 76%
75.0000 mL | Freq: Once | INTRAVENOUS | Status: AC | PRN
  Administered 2016-06-10: 75 mL via INTRAVENOUS

## 2016-06-10 MED ORDER — PANTOPRAZOLE SODIUM 40 MG PO TBEC
40.0000 mg | DELAYED_RELEASE_TABLET | Freq: Every day | ORAL | Status: DC
  Administered 2016-06-10 – 2016-06-11 (×2): 40 mg via ORAL
  Filled 2016-06-10 (×2): qty 1

## 2016-06-10 MED ORDER — TRAZODONE HCL 100 MG PO TABS
100.0000 mg | ORAL_TABLET | Freq: Every day | ORAL | Status: DC
Start: 2016-06-10 — End: 2016-06-11
  Administered 2016-06-10: 21:00:00 100 mg via ORAL
  Filled 2016-06-10: qty 1

## 2016-06-10 MED ORDER — ACETAMINOPHEN 325 MG PO TABS: 650.0000 mg | ORAL_TABLET | ORAL | Status: DC | PRN

## 2016-06-10 MED ORDER — ACETAMINOPHEN 650 MG RE SUPP: 650.0000 mg | RECTAL | Status: DC | PRN

## 2016-06-10 MED ORDER — BENZONATATE 100 MG PO CAPS: 200.0000 mg | ORAL_CAPSULE | Freq: Two times a day (BID) | ORAL | Status: DC | PRN

## 2016-06-10 MED ORDER — ALBUTEROL SULFATE (2.5 MG/3ML) 0.083% IN NEBU: 3.0000 mL | INHALATION_SOLUTION | Freq: Four times a day (QID) | RESPIRATORY_TRACT | Status: DC | PRN

## 2016-06-10 MED ORDER — HYDROCHLOROTHIAZIDE 25 MG PO TABS
25.0000 mg | ORAL_TABLET | ORAL | Status: DC
  Administered 2016-06-11: 25 mg via ORAL
  Filled 2016-06-10: qty 1

## 2016-06-10 MED ORDER — INFLUENZA VAC SPLIT QUAD 0.5 ML IM SUSY
0.5000 mL | PREFILLED_SYRINGE | INTRAMUSCULAR | Status: AC
  Administered 2016-06-11: 10:00:00 0.5 mL via INTRAMUSCULAR
  Filled 2016-06-10: qty 0.5

## 2016-06-10 MED ORDER — DIPHENHYDRAMINE HCL 25 MG PO CAPS: 50.0000 mg | ORAL_CAPSULE | Freq: Four times a day (QID) | ORAL | Status: DC | PRN

## 2016-06-10 MED ORDER — BUSPIRONE HCL 10 MG PO TABS
15.0000 mg | ORAL_TABLET | Freq: Every day | ORAL | Status: DC
Start: 2016-06-10 — End: 2016-06-11
  Administered 2016-06-10 – 2016-06-11 (×2): 15 mg via ORAL
  Filled 2016-06-10: qty 1
  Filled 2016-06-10: qty 2

## 2016-06-10 MED ORDER — PNEUMOCOCCAL VAC POLYVALENT 25 MCG/0.5ML IJ INJ
0.5000 mL | INJECTION | INTRAMUSCULAR | Status: AC
  Administered 2016-06-11: 0.5 mL via INTRAMUSCULAR
  Filled 2016-06-10: qty 0.5

## 2016-06-10 MED ORDER — FUROSEMIDE 20 MG PO TABS
20.0000 mg | ORAL_TABLET | Freq: Every day | ORAL | Status: DC
  Administered 2016-06-11: 10:00:00 20 mg via ORAL
  Filled 2016-06-10: qty 1

## 2016-06-10 MED ORDER — VALPROATE SODIUM 500 MG/5ML IV SOLN
500.0000 mg | Freq: Once | INTRAVENOUS | Status: AC
  Administered 2016-06-10: 500 mg via INTRAVENOUS
  Filled 2016-06-10: qty 5

## 2016-06-10 MED ORDER — APIXABAN 5 MG PO TABS
5.0000 mg | ORAL_TABLET | Freq: Two times a day (BID) | ORAL | Status: DC
  Administered 2016-06-10 – 2016-06-11 (×2): 5 mg via ORAL
  Filled 2016-06-10 (×2): qty 1

## 2016-06-10 MED ORDER — CITALOPRAM HYDROBROMIDE 20 MG PO TABS
40.0000 mg | ORAL_TABLET | Freq: Every day | ORAL | Status: DC
  Administered 2016-06-10 – 2016-06-11 (×2): 40 mg via ORAL
  Filled 2016-06-10 (×2): qty 2

## 2016-06-10 MED ORDER — VERAPAMIL HCL ER 240 MG PO TBCR
240.0000 mg | EXTENDED_RELEASE_TABLET | Freq: Every day | ORAL | Status: DC
  Administered 2016-06-10 – 2016-06-11 (×2): 240 mg via ORAL
  Filled 2016-06-10 (×2): qty 1

## 2016-06-10 MED ORDER — TRAMADOL HCL 50 MG PO TABS: 50.0000 mg | ORAL_TABLET | Freq: Two times a day (BID) | ORAL | Status: DC | PRN

## 2016-06-10 MED ORDER — GABAPENTIN 300 MG PO CAPS
300.0000 mg | ORAL_CAPSULE | Freq: Three times a day (TID) | ORAL | Status: DC
  Administered 2016-06-10 – 2016-06-11 (×2): 300 mg via ORAL
  Filled 2016-06-10 (×2): qty 1

## 2016-06-10 MED ORDER — ACETAMINOPHEN 160 MG/5ML PO SOLN
650.0000 mg | ORAL | Status: DC | PRN
Start: 2016-06-10 — End: 2016-06-11

## 2016-06-10 MED ORDER — TOPIRAMATE 100 MG PO TABS
200.0000 mg | ORAL_TABLET | Freq: Every day | ORAL | Status: DC
  Administered 2016-06-10: 200 mg via ORAL
  Filled 2016-06-10: qty 2

## 2016-06-10 MED ORDER — DIPHENHYDRAMINE HCL 50 MG/ML IJ SOLN
25.0000 mg | Freq: Once | INTRAMUSCULAR | Status: AC
  Administered 2016-06-10: 25 mg via INTRAVENOUS
  Filled 2016-06-10: qty 1

## 2016-06-10 MED ORDER — CITALOPRAM HYDROBROMIDE 20 MG PO TABS
20.0000 mg | ORAL_TABLET | Freq: Every day | ORAL | Status: DC
Start: 2016-06-10 — End: 2016-06-11
  Administered 2016-06-10 – 2016-06-11 (×2): 20 mg via ORAL
  Filled 2016-06-10 (×2): qty 1

## 2016-06-10 MED ORDER — STROKE: EARLY STAGES OF RECOVERY BOOK
Freq: Once | Status: AC
  Administered 2016-06-10: 20:00:00

## 2016-06-10 NOTE — Consult Note (Signed)
Referring Physician: Cinda Quest    Chief Complaint: Right hemiparesis and left facial weakness  HPI: Shirley Rogers is an 51 y.o. female with a history of stroke and hemiplegic migraine who presents with right hemiparesis and left facial weakness.  Patient reports that she does not have a headache.  This presentation she reports is unusual for her migraines.  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  Presented for evaluation at that time.  Also reports dizziness.   Last seen in September for atypical migraine as well.  At that time with right facial droop and mld right sided weakness.    Date last known well: Date: 06/10/2016 Time last known well: Time: 11:50 tPA Given: No: On Eliquis  Past Medical History:  Diagnosis Date  . Anxiety    on xanax in the past  . ASCVD (arteriosclerotic cardiovascular disease)   . Barrett esophagus   . 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 Carotid atherosclerosis 2003   on ultrasound  . Collagen vascular disease ([place])   . Colon polyp   . Complicated migraine    with facial drooping  . Concussion    1974,1982,1987,2016,2016  . DDD (degenerative disc disease), lumbar    MRI 2008, L3-4, L5-S1, spondylotic changes, multilevel facet joint hypertrophic changes  . Depression    on paxil, lexapro, cymbalta in the past  . Diverticulosis   . Dizziness due to old head injury   . Family history of adverse reaction to anesthesia    father - slow to wake  . Fatty liver 2013   On CT abdomen/pelvis  . Fibrocystic breast disease   . GERD (gastroesophageal reflux disease)   . Hemiplegic migraine    daily  . Hemorrhoids   . Hyperlipidemia   . Hypertension    no meds currently.  . IBS (irritable bowel syndrome)   . Insomnia chronic  . Kidney stone 2013   on CT abdomen/pelvis  . 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 Migraine   . Narrowing of intervertebral disc space 2011   C5-6  . Normal cardiac stress test  2009   EF 66%  . Pulmonary emboli ([place])   . Recurrent sinusitis   . Recurrent UTI    question about IC in the past  . Sleep apnea    CPAP machine broken  . Stroke ([place])    2000 - no deficits  . Umbilical hernia 0000000   on CT abdomen/pelvis  . Varicose veins    right lower leg  . Vascular spasm ([place])   . Vertigo    from concussive disorder.  seeing neuro 03/23/15    Past Surgical History:  Procedure Laterality Date  . Biopsy Punch Thyroid    . BREAST EXCISIONAL BIOPSY Right 2000   right mass excision - atypical hyperplasia  . BREAST SURGERY Right    atypical hyperplasia  . CHOLECYSTECTOMY N/A 07/24/2015   Procedure: LAPAROSCOPIC CHOLECYSTECTOMY WITH INTRAOPERATIVE CHOLANGIOGRAM;  Surgeon: Leonie Green, MD;  Location: [facility] ORS;  Service: General;  Laterality: N/A;  . COLONOSCOPY W/ BIOPSIES     Removed 5 polyps  . TUBAL LIGATION      Family History  Problem Relation Age of Onset  . Other Mother     IBS  . Hypertension Mother   . Heart disease Father   . Diverticulosis Father   . Migraines Father   . Other Son     Ulcerative Colitis,  Polonidal cyst  . Parkinson's disease Maternal Grandmother   . Cancer Maternal Grandmother   . Mental illness Paternal Grandmother   . Stroke Paternal Grandmother   . Cancer Paternal Grandmother     brain cancer  . Bipolar disorder Paternal Grandmother   . Stroke Maternal Grandfather   . Cancer Maternal Grandfather     Bladder, Prostate/bladder/prostate  . Heart disease Paternal Grandfather   . Heart attack Paternal Grandfather   . Macular degeneration    . Diabetes Brother   . Breast cancer Maternal Aunt    Social History:  reports that she quit smoking about a year ago. Her smoking use included Cigarettes. She has a 0.25 pack-year smoking history. She has never used smokeless tobacco. She reports that she does not drink alcohol or use drugs.  Allergies:  Allergies  Allergen Reactions  . Rocephin [Ceftriaxone]  Anaphylaxis  . Statins Anaphylaxis, Diarrhea and Nausea And Vomiting  . Compazine [Prochlorperazine] Other (See Comments)    akathesia akathesia akathesia  . Doxycycline Nausea And Vomiting  . Erythromycin Itching, Nausea And Vomiting and Rash    Patient states she is allergic to all mycin drugs  . Sulfa Antibiotics Diarrhea and Nausea And Vomiting  . Meloxicam   . Sulfamethoxazole-Trimethoprim Other (See Comments)  . Ace Inhibitors Itching  . Biaxin [Clarithromycin] Itching, Nausea And Vomiting and Rash  . Latex Rash  . Morphine And Related Nausea And Vomiting    Medications: I have reviewed the patient's current medications. Prior to Admission:  Prior to Admission medications   Medication Sig Start Date End Date Taking? Authorizing Provider  albuterol (PROVENTIL HFA;VENTOLIN HFA) 108 (90 Base) MCG/ACT inhaler Inhale 2 puffs into the lungs every 4 (four) hours as needed for wheezing or shortness of breath.    Historical Provider, MD  apixaban (ELIQUIS) 5 MG TABS tablet Take 1 tablet (5 mg total) by mouth 2 (two) times daily. 03/18/16   Megan P Johnson, DO  azaTHIOprine (IMURAN) 50 MG tablet Take 50 mg by mouth 2 (two) times daily. 04/25/16   Historical Provider, MD  b complex vitamins tablet Take 1 tablet by mouth daily.    Historical Provider, MD  benzonatate (TESSALON) 200 MG capsule Take 1 capsule (200 mg total) by mouth 2 (two) times daily as needed for cough. 05/31/16   Megan P Johnson, DO  busPIRone (BUSPAR) 5 MG tablet Take 3 tablets (15 mg total) by mouth daily. 05/31/16   Megan P Johnson, DO  chlorpheniramine-HYDROcodone (TUSSIONEX PENNKINETIC ER) 10-8 MG/5ML SUER Take 5 mLs by mouth at bedtime as needed. 05/31/16   Megan P Johnson, DO  citalopram (CELEXA) 20 MG tablet TAKE 1 AND 1/2 TABLETS(30 MG) BY MOUTH DAILY 05/24/16   Volney American, PA-C  citalopram (CELEXA) 20 MG tablet TAKE 1 AND 1/2 TABLETS(30 MG) BY MOUTH DAILY 05/24/16   Volney American, PA-C  citalopram  (CELEXA) 40 MG tablet TAKE 1 AND 1/2 TABLETS(60 MG) BY MOUTH DAILY 06/06/16   Megan P Johnson, DO  diazepam (VALIUM) 5 MG tablet Take 1 tablet (5 mg total) by mouth every 6 (six) hours as needed for anxiety. 04/08/16   Cleon Gustin, MD  diphenhydrAMINE (BENADRYL) 25 mg capsule Take 2 capsules (50 mg total) by mouth every 6 (six) hours as needed. 02/19/16   Megan Annia Friendly, DO  Elastic Bandages & Supports (T.E.D. THIGH LENGTH/L-REGULAR) MISC 2 application by Does not apply route every morning. 03/18/16   Merlyn Lot, MD  EPINEPHrine (EPIPEN 2-PAK)  0.3 mg/0.3 mL IJ SOAJ injection Inject 0.3 mLs (0.3 mg total) into the muscle once. 09/24/15   Megan P Johnson, DO  furosemide (LASIX) 20 MG tablet Take 20 mg by mouth daily.    Historical Provider, MD  gabapentin (NEURONTIN) 300 MG capsule Take by mouth. 03/02/16 08/29/16  Historical Provider, MD  hydrochlorothiazide (HYDRODIURIL) 25 MG tablet Take 25 mg by mouth daily.    Historical Provider, MD  levofloxacin (LEVAQUIN) 750 MG tablet Take 1 tablet (750 mg total) by mouth daily. 05/31/16   Megan P Johnson, DO  magnesium oxide (MAG-OX) 400 MG tablet Take 400 mg by mouth daily. Reported on 05/14/2015    Historical Provider, MD  omeprazole (PRILOSEC) 40 MG capsule Take 40 mg by mouth daily.    Historical Provider, MD  PROAIR HFA 108 [phone] Base) MCG/ACT inhaler INHALE 2 PUFFS BY MOUTH EVERY 4 HOURS AS NEEDED FOR WHEEZING 05/24/16   Volney American, PA-C  topiramate (TOPAMAX) 200 MG tablet Take 200 mg by mouth daily. 05/09/16   Historical Provider, MD  topiramate (TOPAMAX) 50 MG tablet  05/21/16   Historical Provider, MD  traMADol (ULTRAM) 50 MG tablet Take 1 tablet (50 mg total) by mouth 2 (two) times daily as needed. 04/25/16   Megan P Johnson, DO  traZODone (DESYREL) 100 MG tablet TAKE 1 TABLET(100 MG) BY MOUTH AT BEDTIME AS NEEDED FOR SLEEP 03/25/16   Megan P Johnson, DO  verapamil (CALAN-SR) 240 MG CR tablet Take 240 mg by mouth daily.  12/03/15    Historical Provider, MD    ROS: History obtained from the patient  General ROS: negative for - chills, fatigue, fever, night sweats, weight gain or weight loss Psychological ROS: negative for - behavioral disorder, hallucinations, memory difficulties, mood swings or suicidal ideation Ophthalmic ROS: negative for - blurry vision, double vision, eye pain or loss of vision ENT ROS: negative for - epistaxis, nasal discharge, oral lesions, sore throat, tinnitus or vertigo Allergy and Immunology ROS: negative for - hives or itchy/watery eyes Hematological and Lymphatic ROS: negative for - bleeding problems, bruising or swollen lymph nodes Endocrine ROS: negative for - galactorrhea, hair pattern changes, polydipsia/polyuria or temperature intolerance Respiratory ROS: negative for - cough, hemoptysis, shortness of breath or wheezing Cardiovascular ROS: negative for - chest pain, dyspnea on exertion, edema or irregular heartbeat Gastrointestinal ROS: negative for - abdominal pain, diarrhea, hematemesis, nausea/vomiting or stool incontinence Genito-Urinary ROS: negative for - dysuria, hematuria, incontinence or urinary frequency/urgency Musculoskeletal ROS: negative for - joint swelling or muscular weakness Neurological ROS: as noted in HPI Dermatological ROS: negative for rash and skin lesion changes  Physical Examination: Blood pressure (!) 158/97, pulse 72, temperature 98.1 F (36.7 C), temperature source Oral, resp. rate 20, weight 115.7 kg (255 lb), last menstrual period 07/01/2013, SpO2 100 %.  HEENT-  Normocephalic, no lesions, without obvious abnormality.  Normal external eye and conjunctiva.  Normal TM's bilaterally.  Normal auditory canals and external ears. Normal external nose, mucus membranes and septum.  Normal pharynx. Cardiovascular- S1, S2 normal, pulses palpable throughout   Lungs- chest clear, no wheezing, rales, normal symmetric air entry Abdomen- soft, non-tender; bowel sounds  normal; no masses,  no organomegaly Extremities- no edema Lymph-no adenopathy palpable Musculoskeletal-no joint tenderness, deformity or swelling Skin-warm and dry, no hyperpigmentation, vitiligo, or suspicious lesions  Neurological Examination Mental Status: Alert, oriented, thought content appropriate.  Speech fluent without evidence of aphasia. Some mild dysarthria noted.  Able to follow 3 step commands without difficulty.  Cranial Nerves: II: Discs flat bilaterally; Visual fields grossly normal, pupils equal, round, reactive to light and accommodation III,IV, VI: left ptosis, extra-ocular motions intact bilaterally V,VII: left facial droop, facial light touch sensation decreased on the right VIII: hearing normal bilaterally IX,X: gag reflex present XI: bilateral shoulder shrug XII: midline tongue extension Motor: Right : Upper extremity   5-/5    Left:     Upper extremity   5/5  Lower extremity   4/5     Lower extremity   5/5 Tone and bulk:normal tone throughout; no atrophy noted Sensory: Pinprick and light touch decreased on the right Deep Tendon Reflexes: 2+ and symmetric throughout Plantars: Right: downgoing   Left: downgoing Cerebellar: normal finger-to-nose and normal heel-to-shin testing bilaterally Gait: not tested due to safety concerns    Laboratory Studies:  Basic Metabolic Panel: No results for input(s): NA, K, CL, CO2, GLUCOSE, BUN, CREATININE, CALCIUM, MG, PHOS in the last 168 hours.  Liver Function Tests: No results for input(s): AST, ALT, ALKPHOS, BILITOT, PROT, ALBUMIN in the last 168 hours. No results for input(s): LIPASE, AMYLASE in the last 168 hours. No results for input(s): AMMONIA in the last 168 hours.  CBC: No results for input(s): WBC, NEUTROABS, HGB, HCT, MCV, PLT in the last 168 hours.  Cardiac Enzymes: No results for input(s): CKTOTAL, CKMB, CKMBINDEX, TROPONINI in the last 168 hours.  BNP: Invalid input(s): POCBNP  CBG:  Recent  Labs Lab 06/10/16 1329  GLUCAP 81    Microbiology: Results for orders placed or performed in visit on 05/19/16  Veritor Flu A/B Waived     Status: None   Collection Time: 05/19/16  2:20 PM  Result Value Ref Range Status   Influenza A Negative Negative Final   Influenza B Negative Negative Final    Comment: If the test is negative for the presence of influenza A or influenza B antigen, infection due to influenza cannot be ruled-out because the antigen present in the sample may be below the detection limit of the test. It is recommended that these results be confirmed by viral culture or an FDA-cleared influenza A and B molecular assay.     Coagulation Studies: No results for input(s): LABPROT, INR in the last 72 hours.  Urinalysis: No results for input(s): COLORURINE, LABSPEC, PHURINE, GLUCOSEU, HGBUR, BILIRUBINUR, KETONESUR, PROTEINUR, UROBILINOGEN, NITRITE, LEUKOCYTESUR in the last 168 hours.  Invalid input(s): APPERANCEUR  Lipid Panel:    Component Value Date/Time   CHOL 260 (H) 02/05/2016 0453   CHOL 281 (H) 12/08/2015 1013   TRIG 139 02/05/2016 0453   HDL 54 02/05/2016 0453   HDL 56 12/08/2015 1013   CHOLHDL 4.8 02/05/2016 0453   VLDL 28 02/05/2016 0453   LDLCALC 178 (H) 02/05/2016 0453   LDLCALC 187 (H) 12/08/2015 1013    HgbA1C:  Lab Results  Component Value Date   HGBA1C 5.7 02/05/2016    Urine Drug Screen:  No results found for: LABOPIA, COCAINSCRNUR, LABBENZ, AMPHETMU, THCU, LABBARB  Alcohol Level: No results for input(s): ETH in the last 168 hours.   Imaging: Ct Head Code Stroke Wo Contrast`  Result Date: 06/10/2016 CLINICAL DATA:  Code stroke. Left facial droop. Right arm and leg weakness. Prior stroke. EXAM: CT HEAD WITHOUT CONTRAST TECHNIQUE: Contiguous axial images were obtained from the base of the skull through the vertex without intravenous contrast. COMPARISON:  Head CT 02/04/2016 and MRI 02/05/2016 FINDINGS: Brain: There is no evidence of  acute cortical infarct, intracranial hemorrhage, mass, midline shift,  or extra-axial fluid collection. The ventricles and sulci are normal. A dilated perivascular space is again seen inferiorly in the left basal ganglia. A chronic lacunar infarct at the posterior aspect of the right putamen is unchanged. Vascular: No hyperdense vessel or unexpected calcification. Skull: No fracture or focal osseous lesion. Sinuses/Orbits: Visualized paranasal sinuses and mastoid air cells are clear. Orbits are unremarkable. Other: None. ASPECTS [facility] Stroke Program Early CT Score) - Ganglionic level infarction (caudate, lentiform nuclei, internal capsule, insula, M1-M3 cortex): 7 - Supraganglionic infarction (M4-M6 cortex): 3 Total score (0-10 with 10 being normal): 10 IMPRESSION: 1. No evidence of acute intracranial abnormality. 2. ASPECTS is 10. 3. Chronic right basal ganglia infarct. These results were called by telephone at the time of interpretation on 06/10/2016 at 1:32 pm to Dr. Alexis Goodell , who verbally acknowledged these results. Electronically Signed   By: Logan Bores M.D.   On: 06/10/2016 13:33    Assessment: 51 y.o. female presenting with left facial droop and right hemiparesis.  Patient with history of stroke and hemiplegic migraine.  On Eliquis.  Last dose last night therefore patient not a tPA candidate.  Head CT reviewed and shows no acute changes.  Will rule out possibility of a vertebrobasilar thrombus.    Stroke Risk Factors - hyperlipidemia and hypertension  Plan: 1. CTA of the head and neck with further treatment plan to be determined based on results.     Alexis Goodell, MD Neurology [phone] 06/10/2016, 1:44 PM  Addendum: CTA reviewed and shows no large vessel occlusion.    Recommendations: 1.  Continue Eliquis 2.  IV Depakote 50omg, Benadryl 25mg  IV 3.  MRI of the brain without contrast 4.  Frequent neuro checks 5.  Telemetry  Case discussed with Dr. Willette Cluster, MD Neurology [phone]

## 2016-06-10 NOTE — ED Triage Notes (Signed)
Pt to ed with c/o right arm weakness, facial drooping, hx of CVA.  Denies headache.

## 2016-06-10 NOTE — ED Notes (Signed)
Patient transported to MRI 

## 2016-06-10 NOTE — ED Notes (Signed)
CBG 81.  

## 2016-06-10 NOTE — ED Notes (Signed)
Patient transported to CTA

## 2016-06-10 NOTE — ED Notes (Signed)
Patient speaking with MRI tech to complete screening questions.

## 2016-06-10 NOTE — ED Notes (Signed)
CODE STROKE CALLED TO [place]

## 2016-06-10 NOTE — ED Provider Notes (Signed)
[facility] Emergency Department Provider Note   ____________________________________________   First MD Initiated Contact with Patient 06/10/16 1317     (approximate)  I have reviewed the triage vital signs and the nursing notes.   HISTORY  Chief Complaint Facial Droop    HPI Shirley Rogers is a 51 y.o. female on [place] who reports to be right side of the face starting at 1150 this morning. She also developed numbness and weakness of the right side. She has had a previous stroke. She did not have a headache. Dr. Doy Mince neurology saw her immediately on arrival in the ER CT was done and was read as negative. Dr. Doy Mince asked for a CTA milligram which was also read as negative.   Past Medical History:  Diagnosis Date  . Anxiety    on xanax in the past  . ASCVD (arteriosclerotic cardiovascular disease)   . Barrett esophagus   . 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 Carotid atherosclerosis 2003   on ultrasound  . Collagen vascular disease ([place])   . Colon polyp   . Complicated migraine    with facial drooping  . Concussion    1974,1982,1987,2016,2016  . DDD (degenerative disc disease), lumbar    MRI 2008, L3-4, L5-S1, spondylotic changes, multilevel facet joint hypertrophic changes  . Depression    on paxil, lexapro, cymbalta in the past  . Diverticulosis   . Dizziness due to old head injury   . Family history of adverse reaction to anesthesia    father - slow to wake  . Fatty liver 2013   On CT abdomen/pelvis  . Fibrocystic breast disease   . GERD (gastroesophageal reflux disease)   . Hemiplegic migraine    daily  . Hemorrhoids   . Hyperlipidemia   . Hypertension    no meds currently.  . IBS (irritable bowel syndrome)   . Insomnia chronic  . Kidney stone 2013   on CT abdomen/pelvis  . 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 Migraine   . Narrowing of intervertebral disc space  2011   C5-6  . Normal cardiac stress test 2009   EF 66%  . Pulmonary emboli ([place])   . Recurrent sinusitis   . Recurrent UTI    question about IC in the past  . Sleep apnea    CPAP machine broken  . Stroke ([place])    2000 - no deficits  . Umbilical hernia 0000000   on CT abdomen/pelvis  . Varicose veins    right lower leg  . Vascular spasm ([place])   . Vertigo    from concussive disorder.  seeing neuro 03/23/15    Patient Active Problem List   Diagnosis Date Noted  . 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 Behcet's disease ([place]) 04/24/2016  . Microhematuria 03/15/2016  . Pelvic floor dysfunction 03/15/2016  . Urethral caruncle 03/15/2016  . Pulmonary embolus ([place]) 03/11/2016  . Facial droop 02/04/2016  . Right hemiparesis ([place]) 02/04/2016  . Tobacco use 12/03/2015  . ANA positive 10/19/2015  . Transient alteration of awareness 09/24/2015  . Snoring 06/10/2015  . Sleep apnea 06/10/2015  . 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 Globus sensation 03/30/2015  . Post concussion syndrome 03/13/2015  . Depression 03/13/2015  . Benign hypertensive renal disease   . Barrett esophagus   . Diverticulosis   .  Hemiplegic migraine   . Lupus   . Stroke ([place])   . Fall 05/25/2014    Past Surgical History:  Procedure Laterality Date  . Biopsy Punch Thyroid    . BREAST EXCISIONAL BIOPSY Right 2000   right mass excision - atypical hyperplasia  . BREAST SURGERY Right    atypical hyperplasia  . CHOLECYSTECTOMY N/A 07/24/2015   Procedure: LAPAROSCOPIC CHOLECYSTECTOMY WITH INTRAOPERATIVE CHOLANGIOGRAM;  Surgeon: Leonie Green, MD;  Location: [facility] ORS;  Service: General;  Laterality: N/A;  . COLONOSCOPY W/ BIOPSIES     Removed 5 polyps  . TUBAL LIGATION      Prior to Admission medications   Medication Sig Start Date End Date Taking? Authorizing Provider  apixaban (ELIQUIS) 5 MG TABS tablet Take 1 tablet (5 mg total) by  mouth 2 (two) times daily. 03/18/16  Yes Megan P Johnson, DO  azaTHIOprine (IMURAN) 50 MG tablet Take 50 mg by mouth 2 (two) times daily. 04/25/16  Yes Historical Provider, MD  b complex vitamins tablet Take 1 tablet by mouth daily.   Yes Historical Provider, MD  benzonatate (TESSALON) 200 MG capsule Take 1 capsule (200 mg total) by mouth 2 (two) times daily as needed for cough. 05/31/16  Yes Megan P Johnson, DO  busPIRone (BUSPAR) 5 MG tablet Take 3 tablets (15 mg total) by mouth daily. 05/31/16  Yes Megan P Johnson, DO  chlorpheniramine-HYDROcodone (TUSSIONEX PENNKINETIC ER) 10-8 MG/5ML SUER Take 5 mLs by mouth at bedtime as needed. 05/31/16  Yes Megan P Johnson, DO  citalopram (CELEXA) 20 MG tablet TAKE 1 AND 1/2 TABLETS(30 MG) BY MOUTH DAILY 05/24/16  Yes Volney American, PA-C  citalopram (CELEXA) 40 MG tablet TAKE 1 AND 1/2 TABLETS(60 MG) BY MOUTH DAILY 06/06/16  Yes Megan P Johnson, DO  diphenhydrAMINE (BENADRYL) 25 mg capsule Take 2 capsules (50 mg total) by mouth every 6 (six) hours as needed. 02/19/16  Yes Megan Annia Friendly, DO  Elastic Bandages & Supports (T.E.D. THIGH LENGTH/L-REGULAR) MISC 2 application by Does not apply route every morning. 03/18/16  Yes Merlyn Lot, MD  EPINEPHrine (EPIPEN 2-PAK) 0.3 mg/0.3 mL IJ SOAJ injection Inject 0.3 mLs (0.3 mg total) into the muscle once. 09/24/15  Yes Megan P Johnson, DO  furosemide (LASIX) 20 MG tablet Take 20 mg by mouth daily as needed.    Yes Historical Provider, MD  gabapentin (NEURONTIN) 300 MG capsule Take 300 mg by mouth 3 (three) times daily.  03/02/16 08/29/16 Yes Historical Provider, MD  hydrochlorothiazide (HYDRODIURIL) 25 MG tablet Take 25 mg by mouth every other day.    Yes Historical Provider, MD  Magnesium Oxide 140 MG CAPS Take 280 mg by mouth daily. Reported on 05/14/2015   Yes Historical Provider, MD  omeprazole (PRILOSEC) 40 MG capsule Take 40 mg by mouth daily.   Yes Historical Provider, MD  PROAIR HFA 108 [phone] Base) MCG/ACT  inhaler INHALE 2 PUFFS BY MOUTH EVERY 4 HOURS AS NEEDED FOR WHEEZING 05/24/16  Yes Volney American, PA-C  topiramate (TOPAMAX) 200 MG tablet Take 200 mg by mouth daily. 05/09/16  Yes Historical Provider, MD  traMADol (ULTRAM) 50 MG tablet Take 1 tablet (50 mg total) by mouth 2 (two) times daily as needed. Patient taking differently: Take 50 mg by mouth 2 (two) times daily.  04/25/16  Yes Megan P Johnson, DO  traZODone (DESYREL) 100 MG tablet TAKE 1 TABLET(100 MG) BY MOUTH AT BEDTIME AS NEEDED FOR SLEEP 03/25/16  Yes [place], DO  verapamil (CALAN-SR) 240 MG CR tablet Take 240 mg by mouth daily.  12/03/15  Yes Historical Provider, MD    Allergies Rocephin [ceftriaxone]; Statins; Compazine [prochlorperazine]; Doxycycline; Erythromycin; Sulfa antibiotics; Meloxicam; Sulfamethoxazole-trimethoprim; Ace inhibitors; Biaxin [clarithromycin]; Latex; and Morphine and related  Family History  Problem Relation Age of Onset  . Other Mother     IBS  . Hypertension Mother   . Heart disease Father   . Diverticulosis Father   . Migraines Father   . Other Son     Ulcerative Colitis, Polonidal cyst  . Parkinson's disease Maternal Grandmother   . Cancer Maternal Grandmother   . Mental illness Paternal Grandmother   . Stroke Paternal Grandmother   . Cancer Paternal Grandmother     brain cancer  . Bipolar disorder Paternal Grandmother   . Stroke Maternal Grandfather   . Cancer Maternal Grandfather     Bladder, Prostate/bladder/prostate  . Heart disease Paternal Grandfather   . Heart attack Paternal Grandfather   . Macular degeneration    . Diabetes Brother   . Breast cancer Maternal Aunt     Social History Social History  Substance Use Topics  . Smoking status: Former Smoker    Packs/day: 0.25    Years: 1.00    Types: Cigarettes    Quit date: 06/12/2015  . Smokeless tobacco: Never Used     Comment: 1-2 cigarettes approx 3 x/wk  . Alcohol use No     Comment: social    Review of  Systems Constitutional: No fever/chills Eyes: No visual changes. ENT: No sore throat. Cardiovascular: Denies chest pain. Respiratory: Denies shortness of breath. Gastrointestinal: No abdominal pain.  No nausea, no vomiting.  No diarrhea.  No constipation. Genitourinary: Negative for dysuria. Musculoskeletal: Negative for back pain. Skin: Negative for rash. Neurological: Negative for headaches 10-point ROS otherwise negative.  ____________________________________________   PHYSICAL EXAM:  VITAL SIGNS: ED Triage Vitals [06/10/16 1313]  Enc Vitals Group     BP (!) 158/97     Pulse Rate 72     Resp 20     Temp 98.1 F (36.7 C)     Temp Source Oral     SpO2 100 %     Weight 255 lb (115.7 kg)     Height      Head Circumference      Peak Flow      Pain Score 0     Pain Loc      Pain Edu?      Excl. in [place]?     Constitutional: Alert and oriented. Well appearing and in no acute distress. Eyes: Conjunctivae are normal. PERRL. EOMI.C neuro exam Head: Atraumatic. Nose: No congestion/rhinnorhea. Mouth/Throat: Mucous membranes are moist.  Oropharynx non-erythematous. Neck: No stridor. Cardiovascular: Normal rate, regular rhythm. Grossly normal heart sounds.  Good peripheral circulation. Respiratory: Normal respiratory effort.  No retractions. Lungs CTAB. Gastrointestinal: Soft and nontender. No distention. No abdominal bruits. No CVA tenderness. Musculoskeletal: No lower extremity tenderness nor edema.  No joint effusions. Neurologic:  Normal speech and language. Right seventh patient's face mouth areas droopy left eyelid is droopy right arm and leg are weaker than the left noticeably so. The patient has normal cerebellar function at least far as finger to nose testing   ____________________________________________   LABS (all labs ordered are listed, but only abnormal results are displayed)  Labs Reviewed  ETHANOL  PROTIME-INR  APTT  CBC  DIFFERENTIAL  GLUCOSE,  CAPILLARY  COMPREHENSIVE METABOLIC PANEL  RAPID URINE  DRUG SCREEN, HOSP PERFORMED  URINALYSIS, ROUTINE W REFLEX MICROSCOPIC  TROPONIN I   ____________________________________________  EKG  EKG read and interpreted by me shows normal sinus rhythm rate of 76 normal axis no acute ST-T wave changes computer is reading left atrial enlargement this is potentially correct ____________________________________________  RADIOLOGY  Study Result   CLINICAL DATA:  Code stroke. Left facial droop. Right arm and leg weakness. Prior stroke.  EXAM: CT HEAD WITHOUT CONTRAST  TECHNIQUE: Contiguous axial images were obtained from the base of the skull through the vertex without intravenous contrast.  COMPARISON:  Head CT 02/04/2016 and MRI 02/05/2016  FINDINGS: Brain: There is no evidence of acute cortical infarct, intracranial hemorrhage, mass, midline shift, or extra-axial fluid collection. The ventricles and sulci are normal. A dilated perivascular space is again seen inferiorly in the left basal ganglia. A chronic lacunar infarct at the posterior aspect of the right putamen is unchanged.  Vascular: No hyperdense vessel or unexpected calcification.  Skull: No fracture or focal osseous lesion.  Sinuses/Orbits: Visualized paranasal sinuses and mastoid air cells are clear. Orbits are unremarkable.  Other: None.  ASPECTS The [place] Stroke Program Early CT Score)  - Ganglionic level infarction (caudate, lentiform nuclei, internal capsule, insula, M1-M3 cortex): 7  - Supraganglionic infarction (M4-M6 cortex): 3  Total score (0-10 with 10 being normal): 10  IMPRESSION: 1. No evidence of acute intracranial abnormality. 2. ASPECTS is 10. 3. Chronic right basal ganglia infarct. These results were called by telephone at the time of interpretation on 06/10/2016 at 1:32 pm to Dr. Alexis Goodell , who verbally acknowledged these results.   Electronically Signed   By:  Logan Bores M.D.   On: 06/10/2016 13:33    Study Result   CLINICAL DATA:  Right arm weakness and facial droop.  Prior stroke.  EXAM: CT ANGIOGRAPHY HEAD AND NECK  TECHNIQUE: Multidetector CT imaging of the head and neck was performed using the standard protocol during bolus administration of intravenous contrast. Multiplanar CT image reconstructions and MIPs were obtained to evaluate the vascular anatomy. Carotid stenosis measurements (when applicable) are obtained utilizing NASCET criteria, using the distal internal carotid diameter as the denominator.  CONTRAST:  75 mL Isovue 370  COMPARISON:  02/04/2016 CTA head and neck  FINDINGS: CTA NECK FINDINGS  Aortic arch: 3 vessel aortic arch. Arch vessel origins are widely patent.  Right carotid system: Patent with unchanged, mild calcified plaque at the carotid bifurcation. No evidence of dissection or significant stenosis.  Left carotid system: Patent with unchanged, mild calcified plaque at the carotid bifurcation. No evidence of dissection or significant stenosis.  Vertebral arteries: Patent and codominant. No evidence of significant stenosis or dissection, however the left V1 segment is not well evaluated due to dense contrast and adjacent veins.  Skeleton: Mild disc degeneration in the cervical spine.  Other neck: No evidence of acute abnormality or mass.  Upper chest: Visualized lung apices are grossly clear.  Review of the MIP images confirms the above findings  CTA HEAD FINDINGS  Anterior circulation: The internal carotid arteries are widely patent from skullbase to carotid termini. ACAs and MCAs are patent without evidence of major branch occlusion or significant stenosis. No aneurysm.  Posterior circulation: The visualized distal vertebral arteries are patent to the basilar without significant stenosis. PICA, SCA, and left AICA origins appear patent. A right AICA is not  clearly identified. The basilar artery is patent without significant stenosis. There are patent posterior communicating arteries bilaterally, right larger than left,  with a hypoplastic left P1 segment and hypoplastic or absent right P1 segment. PCAs are patent without evidence of significant stenosis. No aneurysm.  Venous sinuses: Patent.  Anatomic variants: Fetal type origin of the right PCA.  Review of the MIP images confirms the above findings  IMPRESSION: No emergent large vessel occlusion or significant stenosis in the head and neck.  These results were called by telephone at the time of interpretation on 06/10/2016 at 2:10 pm to Dr. Conni Slipper , who verbally acknowledged these results.   Electronically Signed   By: Logan Bores M.D.   On: 06/10/2016 14:11     ____________________________________________   PROCEDURES  Procedure(s) performed: Procedures  Critical Care performed:   ____________________________________________   INITIAL IMPRESSION / ASSESSMENT AND PLAN / ED COURSE  Pertinent labs & imaging results that were available during my care of the patient were reviewed by me and considered in my medical decision making (see chart for details).    Clinical Course      ____________________________________________   FINAL CLINICAL IMPRESSION(S) / ED DIAGNOSES  Final diagnoses:  Cerebrovascular accident (CVA), unspecified mechanism ([place])      NEW MEDICATIONS STARTED DURING THIS VISIT:  New Prescriptions   No medications on file     Note:  This document was prepared using Dragon voice recognition software and may include unintentional dictation errors.    Nena Polio, MD 06/10/16 [phone]

## 2016-06-10 NOTE — H&P (Signed)
[facility] [place] NAME: Shirley Rogers    MR#:  [id number]  DATE OF BIRTH:  28-Aug-1965  DATE OF ADMISSION:  06/10/2016  PRIMARY CARE PHYSICIAN: Park Liter, DO   REQUESTING/REFERRING PHYSICIAN: Conni Slipper MD  CHIEF COMPLAINT:   Chief Complaint  Patient presents with  . Facial Droop    HISTORY OF PRESENT ILLNESS: Shirley Rogers  is a 51 y.o. female with a known history of  Multiple medical problems including migraines, CVA, depression, essential hypertension, hyperlipidemia unspecified was presenting to the emergency room complaining of left-sided facial droop and right sided upper extremity and lower extremity weakness. Patient was seen in the emergency room in consultation by neurology. CTA of the brain was negative as well as CT scan of the head. Neurology did want to rule out possibility of vertebral basilar thrombosis. Patient continues to complain of same complaints. Denies any headache no visual changes  PAST MEDICAL HISTORY:   Past Medical History:  Diagnosis Date  . Anxiety    on xanax in the past  . ASCVD (arteriosclerotic cardiovascular disease)   . Barrett esophagus   . 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 Carotid atherosclerosis 2003   on ultrasound  . Collagen vascular disease ([place])   . Colon polyp   . Complicated migraine    with facial drooping  . Concussion    1974,1982,1987,2016,2016  . DDD (degenerative disc disease), lumbar    MRI 2008, L3-4, L5-S1, spondylotic changes, multilevel facet joint hypertrophic changes  . Depression    on paxil, lexapro, cymbalta in the past  . Diverticulosis   . Dizziness due to old head injury   . Family history of adverse reaction to anesthesia    father - slow to wake  . Fatty liver 2013   On CT abdomen/pelvis  . Fibrocystic breast disease   . GERD (gastroesophageal reflux disease)   . Hemiplegic migraine    daily  . Hemorrhoids   . Hyperlipidemia   . Hypertension    no meds  currently.  . IBS (irritable bowel syndrome)   . Insomnia chronic  . Kidney stone 2013   on CT abdomen/pelvis  . 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 Migraine   . Narrowing of intervertebral disc space 2011   C5-6  . Normal cardiac stress test 2009   EF 66%  . Pulmonary emboli ([place])   . Recurrent sinusitis   . Recurrent UTI    question about IC in the past  . Sleep apnea    CPAP machine broken  . Stroke ([place])    2000 - no deficits  . Umbilical hernia 0000000   on CT abdomen/pelvis  . Varicose veins    right lower leg  . Vascular spasm ([place])   . Vertigo    from concussive disorder.  seeing neuro 03/23/15    PAST SURGICAL HISTORY: Past Surgical History:  Procedure Laterality Date  . Biopsy Punch Thyroid    . BREAST EXCISIONAL BIOPSY Right 2000   right mass excision - atypical hyperplasia  . BREAST SURGERY Right    atypical hyperplasia  . CHOLECYSTECTOMY N/A 07/24/2015   Procedure: LAPAROSCOPIC CHOLECYSTECTOMY WITH INTRAOPERATIVE CHOLANGIOGRAM;  Surgeon: Leonie Green, MD;  Location: [facility] ORS;  Service: General;  Laterality: N/A;  . COLONOSCOPY W/ BIOPSIES     Removed 5 polyps  . TUBAL LIGATION      SOCIAL  HISTORY:  Social History  Substance Use Topics  . Smoking status: Former Smoker    Packs/day: 0.25    Years: 1.00    Types: Cigarettes    Quit date: 06/12/2015  . Smokeless tobacco: Never Used     Comment: 1-2 cigarettes approx 3 x/wk  . Alcohol use No     Comment: social    FAMILY HISTORY:  Family History  Problem Relation Age of Onset  . Other Mother     IBS  . Hypertension Mother   . Heart disease Father   . Diverticulosis Father   . Migraines Father   . Other Son     Ulcerative Colitis, Polonidal cyst  . Parkinson's disease Maternal Grandmother   . Cancer Maternal Grandmother   . Mental illness Paternal Grandmother   . Stroke Paternal Grandmother   . Cancer Paternal Grandmother      brain cancer  . Bipolar disorder Paternal Grandmother   . Stroke Maternal Grandfather   . Cancer Maternal Grandfather     Bladder, Prostate/bladder/prostate  . Heart disease Paternal Grandfather   . Heart attack Paternal Grandfather   . Macular degeneration    . Diabetes Brother   . Breast cancer Maternal Aunt     DRUG ALLERGIES:  Allergies  Allergen Reactions  . Rocephin [Ceftriaxone] Anaphylaxis  . Statins Anaphylaxis, Diarrhea and Nausea And Vomiting  . Compazine [Prochlorperazine] Other (See Comments)    akathesia akathesia akathesia  . Doxycycline Nausea And Vomiting  . Erythromycin Itching, Nausea And Vomiting and Rash    Patient states she is allergic to all mycin drugs  . Sulfa Antibiotics Diarrhea and Nausea And Vomiting  . Meloxicam   . Sulfamethoxazole-Trimethoprim Other (See Comments)  . Ace Inhibitors Itching  . Biaxin [Clarithromycin] Itching, Nausea And Vomiting and Rash  . Latex Rash  . Morphine And Related Nausea And Vomiting    REVIEW OF SYSTEMS:   CONSTITUTIONAL: No fever, fatigue or weakness.  EYES: No blurred or double vision.  EARS, NOSE, AND THROAT: No tinnitus or ear pain.  RESPIRATORY: No cough, shortness of breath, wheezing or hemoptysis.  CARDIOVASCULAR: No chest pain, orthopnea, edema.  GASTROINTESTINAL: No nausea, vomiting, diarrhea or abdominal pain.  GENITOURINARY: No dysuria, hematuria.  ENDOCRINE: No polyuria, nocturia,  HEMATOLOGY: No anemia, easy bruising or bleeding SKIN: No rash or lesion. MUSCULOSKELETAL: No joint pain or arthritis.   NEUROLOGIC: Left-sided facial numbness right upper extremity and right lower extremity weakness PSYCHIATRY: No anxiety or depression.   MEDICATIONS AT HOME:  Prior to Admission medications   Medication Sig Start Date End Date Taking? Authorizing Provider  apixaban (ELIQUIS) 5 MG TABS tablet Take 1 tablet (5 mg total) by mouth 2 (two) times daily. 03/18/16  Yes Megan P Johnson, DO   azaTHIOprine (IMURAN) 50 MG tablet Take 50 mg by mouth 2 (two) times daily. 04/25/16  Yes Historical Provider, MD  b complex vitamins tablet Take 1 tablet by mouth daily.   Yes Historical Provider, MD  benzonatate (TESSALON) 200 MG capsule Take 1 capsule (200 mg total) by mouth 2 (two) times daily as needed for cough. 05/31/16  Yes Megan P Johnson, DO  busPIRone (BUSPAR) 5 MG tablet Take 3 tablets (15 mg total) by mouth daily. 05/31/16  Yes Megan P Johnson, DO  chlorpheniramine-HYDROcodone (TUSSIONEX PENNKINETIC ER) 10-8 MG/5ML SUER Take 5 mLs by mouth at bedtime as needed. 05/31/16  Yes Megan P Johnson, DO  citalopram (CELEXA) 20 MG tablet TAKE 1 AND 1/2 TABLETS(30 MG)  BY MOUTH DAILY 05/24/16  Yes Volney American, PA-C  citalopram (CELEXA) 40 MG tablet TAKE 1 AND 1/2 TABLETS(60 MG) BY MOUTH DAILY 06/06/16  Yes Megan P Johnson, DO  diphenhydrAMINE (BENADRYL) 25 mg capsule Take 2 capsules (50 mg total) by mouth every 6 (six) hours as needed. 02/19/16  Yes Megan Annia Friendly, DO  Elastic Bandages & Supports (T.E.D. THIGH LENGTH/L-REGULAR) MISC 2 application by Does not apply route every morning. 03/18/16  Yes Merlyn Lot, MD  EPINEPHrine (EPIPEN 2-PAK) 0.3 mg/0.3 mL IJ SOAJ injection Inject 0.3 mLs (0.3 mg total) into the muscle once. 09/24/15  Yes Megan P Johnson, DO  furosemide (LASIX) 20 MG tablet Take 20 mg by mouth daily as needed.    Yes Historical Provider, MD  gabapentin (NEURONTIN) 300 MG capsule Take 300 mg by mouth 3 (three) times daily.  03/02/16 08/29/16 Yes Historical Provider, MD  hydrochlorothiazide (HYDRODIURIL) 25 MG tablet Take 25 mg by mouth every other day.    Yes Historical Provider, MD  Magnesium Oxide 140 MG CAPS Take 280 mg by mouth daily. Reported on 05/14/2015   Yes Historical Provider, MD  omeprazole (PRILOSEC) 40 MG capsule Take 40 mg by mouth daily.   Yes Historical Provider, MD  PROAIR HFA 108 [phone] Base) MCG/ACT inhaler INHALE 2 PUFFS BY MOUTH EVERY 4 HOURS AS NEEDED FOR  WHEEZING 05/24/16  Yes Volney American, PA-C  topiramate (TOPAMAX) 200 MG tablet Take 200 mg by mouth daily. 05/09/16  Yes Historical Provider, MD  traMADol (ULTRAM) 50 MG tablet Take 1 tablet (50 mg total) by mouth 2 (two) times daily as needed. Patient taking differently: Take 50 mg by mouth 2 (two) times daily.  04/25/16  Yes Megan P Johnson, DO  traZODone (DESYREL) 100 MG tablet TAKE 1 TABLET(100 MG) BY MOUTH AT BEDTIME AS NEEDED FOR SLEEP 03/25/16  Yes Megan P Johnson, DO  verapamil (CALAN-SR) 240 MG CR tablet Take 240 mg by mouth daily.  12/03/15  Yes Historical Provider, MD      PHYSICAL EXAMINATION:   VITAL SIGNS: Blood pressure 120/71, pulse 78, temperature 98.1 F (36.7 C), temperature source Oral, resp. rate 17, weight 255 lb (115.7 kg), last menstrual period 07/01/2013, SpO2 96 %.  GENERAL:  51 y.o.-year-old patient lying in the bed with no acute distress.  EYES: Pupils equal, round, reactive to light and accommodation. No scleral icterus. Extraocular muscles intact.  HEENT: Head atraumatic, normocephalic. Oropharynx and nasopharynx clear.  NECK:  Supple, no jugular venous distention. No thyroid enlargement, no tenderness.  LUNGS: Normal breath sounds bilaterally, no wheezing, rales,rhonchi or crepitation. No use of accessory muscles of respiration.  CARDIOVASCULAR: S1, S2 normal. No murmurs, rubs, or gallops.  ABDOMEN: Soft, nontender, nondistended. Bowel sounds present. No organomegaly or mass.  EXTREMITIES: No pedal edema, cyanosis, or clubbing.  NEUROLOGIC: Left ptosis and extraocular motion intact bilaterally left facial droop decrease in left-sided facial sensation, right lower extremity 4 out of 5 right upper extremity 4 out of 5 PSYCHIATRIC: The patient is alert and oriented x 3.  SKIN: No obvious rash, lesion, or ulcer.   LABORATORY PANEL:   CBC  Recent Labs Lab 06/10/16 1334  WBC 5.1  HGB 13.7  HCT 39.3  PLT 213  MCV 89.0  MCH 31.1  MCHC 34.9  RDW  14.0  LYMPHSABS 1.1  MONOABS 0.5  EOSABS 0.1  BASOSABS 0.0   ------------------------------------------------------------------------------------------------------------------  Chemistries  No results for input(s): NA, K, CL, CO2, GLUCOSE, BUN, CREATININE, CALCIUM, MG,  AST, ALT, ALKPHOS, BILITOT in the last 168 hours.  Invalid input(s): GFRCGP ------------------------------------------------------------------------------------------------------------------ CrCl cannot be calculated (Patient's most recent lab result is older than the maximum 21 days allowed.). ------------------------------------------------------------------------------------------------------------------ No results for input(s): TSH, T4TOTAL, T3FREE, THYROIDAB in the last 72 hours.  Invalid input(s): FREET3   Coagulation profile  Recent Labs Lab 06/10/16 1334  INR 1.02   ------------------------------------------------------------------------------------------------------------------- No results for input(s): DDIMER in the last 72 hours. -------------------------------------------------------------------------------------------------------------------  Cardiac Enzymes No results for input(s): CKMB, TROPONINI, MYOGLOBIN in the last 168 hours.  Invalid input(s): CK ------------------------------------------------------------------------------------------------------------------ Invalid input(s): POCBNP  ---------------------------------------------------------------------------------------------------------------  Urinalysis    Component Value Date/Time   COLORURINE YELLOW (A) 03/10/2016 1913   APPEARANCEUR Hazy (A) 04/08/2016 0951   LABSPEC 1.010 03/10/2016 1913   LABSPEC 1.005 06/07/2014 1730   PHURINE 8.0 03/10/2016 1913   GLUCOSEU Negative 04/08/2016 0951   GLUCOSEU NEGATIVE 06/07/2014 1730   HGBUR NEGATIVE 03/10/2016 1913   BILIRUBINUR Negative 04/08/2016 [place] 06/07/2014  [place] 03/10/2016 1913   PROTEINUR Negative 04/08/2016 0951   PROTEINUR NEGATIVE 03/10/2016 1913   NITRITE Negative 04/08/2016 0951   NITRITE NEGATIVE 03/10/2016 1913   LEUKOCYTESUR Negative 04/08/2016 0951   LEUKOCYTESUR NEGATIVE 06/07/2014 1730     RADIOLOGY: Ct Angio Head W Or Wo Contrast  Result Date: 06/10/2016 CLINICAL DATA:  Right arm weakness and facial droop.  Prior stroke. EXAM: CT ANGIOGRAPHY HEAD AND NECK TECHNIQUE: Multidetector CT imaging of the head and neck was performed using the standard protocol during bolus administration of intravenous contrast. Multiplanar CT image reconstructions and MIPs were obtained to evaluate the vascular anatomy. Carotid stenosis measurements (when applicable) are obtained utilizing NASCET criteria, using the distal internal carotid diameter as the denominator. CONTRAST:  75 mL Isovue 370 COMPARISON:  02/04/2016 CTA head and neck FINDINGS: CTA NECK FINDINGS Aortic arch: 3 vessel aortic arch. Arch vessel origins are widely patent. Right carotid system: Patent with unchanged, mild calcified plaque at the carotid bifurcation. No evidence of dissection or significant stenosis. Left carotid system: Patent with unchanged, mild calcified plaque at the carotid bifurcation. No evidence of dissection or significant stenosis. Vertebral arteries: Patent and codominant. No evidence of significant stenosis or dissection, however the left V1 segment is not well evaluated due to dense contrast and adjacent veins. Skeleton: Mild disc degeneration in the cervical spine. Other neck: No evidence of acute abnormality or mass. Upper chest: Visualized lung apices are grossly clear. Review of the MIP images confirms the above findings CTA HEAD FINDINGS Anterior circulation: The internal carotid arteries are widely patent from skullbase to carotid termini. ACAs and MCAs are patent without evidence of major branch occlusion or significant stenosis. No aneurysm.  Posterior circulation: The visualized distal vertebral arteries are patent to the basilar without significant stenosis. PICA, SCA, and left AICA origins appear patent. A right AICA is not clearly identified. The basilar artery is patent without significant stenosis. There are patent posterior communicating arteries bilaterally, right larger than left, with a hypoplastic left P1 segment and hypoplastic or absent right P1 segment. PCAs are patent without evidence of significant stenosis. No aneurysm. Venous sinuses: Patent. Anatomic variants: Fetal type origin of the right PCA. Review of the MIP images confirms the above findings IMPRESSION: No emergent large vessel occlusion or significant stenosis in the head and neck. These results were called by telephone at the time of interpretation on 06/10/2016 at 2:10 pm to Dr. Conni Slipper , who verbally acknowledged these results. Electronically  Signed   By: Logan Bores M.D.   On: 06/10/2016 14:11   Ct Angio Neck W And/or Wo Contrast  Result Date: 06/10/2016 CLINICAL DATA:  Right arm weakness and facial droop.  Prior stroke. EXAM: CT ANGIOGRAPHY HEAD AND NECK TECHNIQUE: Multidetector CT imaging of the head and neck was performed using the standard protocol during bolus administration of intravenous contrast. Multiplanar CT image reconstructions and MIPs were obtained to evaluate the vascular anatomy. Carotid stenosis measurements (when applicable) are obtained utilizing NASCET criteria, using the distal internal carotid diameter as the denominator. CONTRAST:  75 mL Isovue 370 COMPARISON:  02/04/2016 CTA head and neck FINDINGS: CTA NECK FINDINGS Aortic arch: 3 vessel aortic arch. Arch vessel origins are widely patent. Right carotid system: Patent with unchanged, mild calcified plaque at the carotid bifurcation. No evidence of dissection or significant stenosis. Left carotid system: Patent with unchanged, mild calcified plaque at the carotid bifurcation. No evidence of  dissection or significant stenosis. Vertebral arteries: Patent and codominant. No evidence of significant stenosis or dissection, however the left V1 segment is not well evaluated due to dense contrast and adjacent veins. Skeleton: Mild disc degeneration in the cervical spine. Other neck: No evidence of acute abnormality or mass. Upper chest: Visualized lung apices are grossly clear. Review of the MIP images confirms the above findings CTA HEAD FINDINGS Anterior circulation: The internal carotid arteries are widely patent from skullbase to carotid termini. ACAs and MCAs are patent without evidence of major branch occlusion or significant stenosis. No aneurysm. Posterior circulation: The visualized distal vertebral arteries are patent to the basilar without significant stenosis. PICA, SCA, and left AICA origins appear patent. A right AICA is not clearly identified. The basilar artery is patent without significant stenosis. There are patent posterior communicating arteries bilaterally, right larger than left, with a hypoplastic left P1 segment and hypoplastic or absent right P1 segment. PCAs are patent without evidence of significant stenosis. No aneurysm. Venous sinuses: Patent. Anatomic variants: Fetal type origin of the right PCA. Review of the MIP images confirms the above findings IMPRESSION: No emergent large vessel occlusion or significant stenosis in the head and neck. These results were called by telephone at the time of interpretation on 06/10/2016 at 2:10 pm to Dr. Conni Slipper , who verbally acknowledged these results. Electronically Signed   By: Logan Bores M.D.   On: 06/10/2016 14:11   Ct Head Code Stroke Wo Contrast`  Result Date: 06/10/2016 CLINICAL DATA:  Code stroke. Left facial droop. Right arm and leg weakness. Prior stroke. EXAM: CT HEAD WITHOUT CONTRAST TECHNIQUE: Contiguous axial images were obtained from the base of the skull through the vertex without intravenous contrast. COMPARISON:   Head CT 02/04/2016 and MRI 02/05/2016 FINDINGS: Brain: There is no evidence of acute cortical infarct, intracranial hemorrhage, mass, midline shift, or extra-axial fluid collection. The ventricles and sulci are normal. A dilated perivascular space is again seen inferiorly in the left basal ganglia. A chronic lacunar infarct at the posterior aspect of the right putamen is unchanged. Vascular: No hyperdense vessel or unexpected calcification. Skull: No fracture or focal osseous lesion. Sinuses/Orbits: Visualized paranasal sinuses and mastoid air cells are clear. Orbits are unremarkable. Other: None. ASPECTS [facility] Stroke Program Early CT Score) - Ganglionic level infarction (caudate, lentiform nuclei, internal capsule, insula, M1-M3 cortex): 7 - Supraganglionic infarction (M4-M6 cortex): 3 Total score (0-10 with 10 being normal): 10 IMPRESSION: 1. No evidence of acute intracranial abnormality. 2. ASPECTS is 10. 3. Chronic right basal ganglia  infarct. These results were called by telephone at the time of interpretation on 06/10/2016 at 1:32 pm to Dr. Alexis Goodell , who verbally acknowledged these results. Electronically Signed   By: Logan Bores M.D.   On: 06/10/2016 13:33    EKG: Orders placed or performed during the hospital encounter of 06/10/16  . ED EKG  . ED EKG  . EKG 12-Lead  . EKG 12-Lead    IMPRESSION AND PLAN: Patient is a 51 year old with multiple medical problems presenting with left facial weakness and a droop and right upper extremity and lower extremity weakness  1. Left facial weakness and right upper extremity right lower extremity weakness Differential include possible CVA, as well as possible vertebrobasilar thrombus Obtain MRI of the brain Continue eliquis We'll obtain swallow evaluation We will obtain physical therapy evaluation  2. History of DVT and PE Continue Eliquis  3. Depression and anxiety continue Celexa and bupropion  4. History of lupus continue azithiprin    5. Miscellaneous continue Elavil was for DVT prophylaxis   All the records are reviewed and case discussed with ED provider. Management plans discussed with the patient, family and they are in agreement.  CODE STATUS: Code Status History    Date Active Date Inactive Code Status Order ID Comments User Context   03/11/2016  1:31 AM 03/11/2016  4:28 PM Full Code [id number]  Harvie Bridge, DO Inpatient   02/04/2016 10:48 PM 02/05/2016  1:23 PM Full Code [id number]  Lance Coon, MD Inpatient       TOTAL TIME TAKING CARE OF THIS PATIENT: 62minutes.    Dustin Flock M.D on 06/10/2016 at 2:57 PM  Between 7am to 6pm - Pager - [phone]  After 6pm go to [email or web address] - password [email or web address] [facility]  Office  [phone]  CC: Primary care physician; Park Liter, DO

## 2016-06-10 NOTE — Progress Notes (Signed)
   06/10/16 1306  Clinical Encounter Type  Visited With Health care provider  Visit Type Initial;Code  Referral From Nurse  Consult/Referral To Chaplain  Was paged to Triage 1 for a Medical Alert Code Stroke. Chaplain responded to the area and was informed patient taken to CT. The nurse at the location could not give me any further details. Chaplain checked in CT waiting room and could not locate any family. Ashley Jacobs (phone [phone])

## 2016-06-11 ENCOUNTER — Observation Stay
Admit: 2016-06-11 | Discharge: 2016-06-11 | Disposition: A | Discharge status: Home / Self Care | Payer: BC Managed Care – PPO | Attending: Internal Medicine | Admitting: Internal Medicine

## 2016-06-11 DIAGNOSIS — R531 Weakness: Secondary | ICD-10-CM | POA: Diagnosis not present

## 2016-06-11 LAB — LIPID PANEL
CHOLESTEROL: 204 mg/dL — AB (ref 0–200)
HDL: 41 mg/dL (ref >40)
LDL Cholesterol: 143 mg/dL — ABNORMAL HIGH (ref 0–99)
Total CHOL/HDL Ratio: 5 RATIO
Triglycerides: 99 mg/dL (ref <150)
VLDL: 20 mg/dL (ref 0–40)

## 2016-06-11 NOTE — Discharge Summary (Addendum)
[facility] [place] NAME: Shirley Rogers    Shirley#:  [id number]  DATE OF BIRTH:  Oct 20, 1965  DATE OF ADMISSION:  06/10/2016 ADMITTING PHYSICIAN: Dustin Flock, MD  DATE OF DISCHARGE: 06/11/16 PRIMARY CARE PHYSICIAN: Park Liter, DO    ADMISSION DIAGNOSIS:  Stroke (cerebrum) ([place]) [I63.9] Right sided weakness [R53.1] Cerebrovascular accident (CVA), unspecified mechanism ([place]) [I63.9]  DISCHARGE DIAGNOSIS:  Active Problems:   Facial droop   Right sided weakness   SECONDARY DIAGNOSIS:   Past Medical History:  Diagnosis Date  . Anxiety    on xanax in the past  . ASCVD (arteriosclerotic cardiovascular disease)   . Barrett esophagus   . 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 Carotid atherosclerosis 2003   on ultrasound  . Collagen vascular disease ([place])   . Colon polyp   . Complicated migraine    with facial drooping  . Concussion    1974,1982,1987,2016,2016  . DDD (degenerative disc disease), lumbar    MRI 2008, L3-4, L5-S1, spondylotic changes, multilevel facet joint hypertrophic changes  . Depression    on paxil, lexapro, cymbalta in the past  . Diverticulosis   . Dizziness due to old head injury   . Family history of adverse reaction to anesthesia    father - slow to wake  . Fatty liver 2013   On CT abdomen/pelvis  . Fibrocystic breast disease   . GERD (gastroesophageal reflux disease)   . Hemiplegic migraine    daily  . Hemorrhoids   . Hyperlipidemia   . Hypertension    no meds currently.  . IBS (irritable bowel syndrome)   . Insomnia chronic  . Kidney stone 2013   on CT abdomen/pelvis  . 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 Migraine   . Narrowing of intervertebral disc space 2011   C5-6  . Normal cardiac stress test 2009   EF 66%  . Pulmonary emboli ([place])   . Recurrent sinusitis   . Recurrent UTI    question about IC in the past  . Sleep apnea     CPAP machine broken  . Stroke ([place])    2000 - no deficits  . Umbilical hernia 0000000   on CT abdomen/pelvis  . Varicose veins    right lower leg  . Vascular spasm ([place])   . Vertigo    from concussive disorder.  seeing neuro 03/23/15    HOSPITAL COURSE:   HISTORY OF PRESENT ILLNESS: Shirley Rogers  is a 51 y.o. female with a known history of  Multiple medical problems including migraines, CVA, depression, essential hypertension, hyperlipidemia unspecified was presenting to the emergency room complaining of left-sided facial droop and right sided upper extremity and lower extremity weakness. Patient was seen in the emergency room in consultation by neurology. CTA of the brain was negative as well as CT scan of the head. Neurology did want to rule out possibility of vertebral basilar thrombosis. Patient continues to complain of same complaints. Denies any headache no visual changesPlease review history and physical for details  Hospital course  Patient is a 51 year old with multiple medical problems presenting with left facial weakness and a droop and right upper extremity and lower extremity weakness  1. Left facial weakness and right upper extremity right lower extremity weakness Differential include possible CVA, as well as possible vertebrobasilar thrombus CT head and MRI brain are negative neurologist has recommended to  continue eliquis and outpatient follow-up with neurology Physical therapy has recommended -op PT Occupational therapy has recommended outpatient Op OT Patient was seen by speech therapy known is identified   LDL 143   2. History of DVT and PE Continue Eliquis  3. Depression and anxiety continue Celexa and bupropion  4. History of lupus continue azithiprin   5.  continue ElIQUIS  was for DVT prophylaxis     DISCHARGE CONDITIONS:   fair  CONSULTS OBTAINED:  Treatment Team:  Catarina Hartshorn, MD   PROCEDURES  none  DRUG ALLERGIES:    Allergies  Allergen Reactions  . Rocephin [Ceftriaxone] Anaphylaxis  . Statins Anaphylaxis, Diarrhea and Nausea And Vomiting  . Compazine [Prochlorperazine] Other (See Comments)    akathesia akathesia akathesia  . Doxycycline Nausea And Vomiting  . Erythromycin Itching, Nausea And Vomiting and Rash    Patient states she is allergic to all mycin drugs  . Sulfa Antibiotics Diarrhea and Nausea And Vomiting  . Meloxicam   . Sulfamethoxazole-Trimethoprim Other (See Comments)  . Ace Inhibitors Itching  . Biaxin [Clarithromycin] Itching, Nausea And Vomiting and Rash  . Latex Rash  . Morphine And Related Nausea And Vomiting    DISCHARGE MEDICATIONS:   Current Discharge Medication List    CONTINUE these medications which have NOT CHANGED   Details  apixaban (ELIQUIS) 5 MG TABS tablet Take 1 tablet (5 mg total) by mouth 2 (two) times daily. Qty: 60 tablet, Refills: 5    azaTHIOprine (IMURAN) 50 MG tablet Take 50 mg by mouth 2 (two) times daily.    b complex vitamins tablet Take 1 tablet by mouth daily.    benzonatate (TESSALON) 200 MG capsule Take 1 capsule (200 mg total) by mouth 2 (two) times daily as needed for cough. Qty: 30 capsule, Refills: 0    busPIRone (BUSPAR) 5 MG tablet Take 3 tablets (15 mg total) by mouth daily. Qty: 90 tablet, Refills: 1    chlorpheniramine-HYDROcodone (TUSSIONEX PENNKINETIC ER) 10-8 MG/5ML SUER Take 5 mLs by mouth at bedtime as needed. Qty: 140 mL, Refills: 0    !! citalopram (CELEXA) 20 MG tablet TAKE 1 AND 1/2 TABLETS(30 MG) BY MOUTH DAILY Qty: 45 tablet, Refills: 0    !! citalopram (CELEXA) 40 MG tablet TAKE 1 AND 1/2 TABLETS(60 MG) BY MOUTH DAILY Qty: 135 tablet, Refills: 1    diphenhydrAMINE (BENADRYL) 25 mg capsule Take 2 capsules (50 mg total) by mouth every 6 (six) hours as needed. Qty: 120 capsule, Refills: 12    Elastic Bandages & Supports (T.E.D. THIGH LENGTH/L-REGULAR) MISC 2 application by Does not apply route every  morning. Qty: 4 each, Refills: 0    EPINEPHrine (EPIPEN 2-PAK) 0.3 mg/0.3 mL IJ SOAJ injection Inject 0.3 mLs (0.3 mg total) into the muscle once. Qty: 1 Device, Refills: 12    furosemide (LASIX) 20 MG tablet Take 20 mg by mouth daily as needed.     gabapentin (NEURONTIN) 300 MG capsule Take 300 mg by mouth 3 (three) times daily.     hydrochlorothiazide (HYDRODIURIL) 25 MG tablet Take 25 mg by mouth every other day.     Magnesium Oxide 140 MG CAPS Take 280 mg by mouth daily. Reported on 05/14/2015    omeprazole (PRILOSEC) 40 MG capsule Take 40 mg by mouth daily.    PROAIR HFA 108 (90 Base) MCG/ACT inhaler INHALE 2 PUFFS BY MOUTH EVERY 4 HOURS AS NEEDED FOR WHEEZING Qty: 17 g, Refills: 0    topiramate (TOPAMAX) 200 MG  tablet Take 200 mg by mouth daily.    traMADol (ULTRAM) 50 MG tablet Take 1 tablet (50 mg total) by mouth 2 (two) times daily as needed. Qty: 56 tablet, Refills: 0    traZODone (DESYREL) 100 MG tablet TAKE 1 TABLET(100 MG) BY MOUTH AT BEDTIME AS NEEDED FOR SLEEP Qty: 90 tablet, Refills: 0    verapamil (CALAN-SR) 240 MG CR tablet Take 240 mg by mouth daily.      !! - Potential duplicate medications found. Please discuss with provider.       DISCHARGE INSTRUCTIONS:   Outpatient PT and occupational therapy Follow-up with primary care physician in a week Follow-up with neurologist Dr. Manuella Ghazi in 2 weeks   DIET:  cardiac  DISCHARGE CONDITION:  Fair  ACTIVITY:  Activity as tolerated per PT  OXYGEN:  Home Oxygen: No.   Oxygen Delivery: room air  DISCHARGE LOCATION:  home   If you experience worsening of your admission symptoms, develop shortness of breath, life threatening emergency, suicidal or homicidal thoughts you must seek medical attention immediately by calling 911 or calling your MD immediately  if symptoms less severe.  You Must read complete instructions/literature along with all the possible adverse reactions/side effects for all the Medicines  you take and that have been prescribed to you. Take any new Medicines after you have completely understood and accpet all the possible adverse reactions/side effects.   Please note  You were cared for by a hospitalist during your hospital stay. If you have any questions about your discharge medications or the care you received while you were in the hospital after you are discharged, you can call the unit and asked to speak with the hospitalist on call if the hospitalist that took care of you is not available. Once you are discharged, your primary care physician will handle any further medical issues. Please note that NO REFILLS for any discharge medications will be authorized once you are discharged, as it is imperative that you return to your primary care physician (or establish a relationship with a primary care physician if you do not have one) for your aftercare needs so that they can reassess your need for medications and monitor your lab values.     Today  Chief Complaint  Patient presents with  . Facial Droop  Patient is feeling okay still has facial droop and right-sided weakness. Denies any headaches. Okay to discharge patient from neurology standpoint   ROS:  CONSTITUTIONAL: Denies fevers, chills. Denies any fatigue, weakness.  EYES: Denies blurry vision, double vision, eye pain. EARS, NOSE, THROAT: Denies tinnitus, ear pain, hearing loss. RESPIRATORY: Denies cough, wheeze, shortness of breath.  CARDIOVASCULAR: Denies chest pain, palpitations, edema.  GASTROINTESTINAL: Denies nausea, vomiting, diarrhea, abdominal pain. Denies bright red blood per rectum. GENITOURINARY: Denies dysuria, hematuria. ENDOCRINE: Denies nocturia or thyroid problems. HEMATOLOGIC AND LYMPHATIC: Denies easy bruising or bleeding. SKIN: Denies rash or lesion. MUSCULOSKELETAL: Denies pain in neck, back, shoulder, knees, hips or arthritic symptoms.  NEUROLOGIC: Reporting right-sided weakness Denies  paralysis, paresthesias.  PSYCHIATRIC: Denies anxiety or depressive symptoms.   VITAL SIGNS:  Blood pressure 127/76, pulse 72, temperature 98.1 F (36.7 C), temperature source Oral, resp. rate 16, height 5\' 5"  (1.651 m), weight 119.5 kg (263 lb 6.4 oz), last menstrual period 07/01/2013, SpO2 96 %.  I/O:    Intake/Output Summary (Last 24 hours) at 06/11/16 1253 Last data filed at 06/11/16 0800  Gross per 24 hour  Intake  600 ml  Output                0 ml  Net              600 ml    PHYSICAL EXAMINATION:  GENERAL:  51 y.o.-year-old patient lying in the bed with no acute distress.  EYES: Pupils equal, round, reactive to light and accommodation. No scleral icterus. Extraocular muscles intact.  HEENT: Head atraumatic, normocephalic. Oropharynx and nasopharynx clear.  NECK:  Supple, no jugular venous distention. No thyroid enlargement, no tenderness.  LUNGS: Normal breath sounds bilaterally, no wheezing, rales,rhonchi or crepitation. No use of accessory muscles of respiration.  CARDIOVASCULAR: S1, S2 normal. No murmurs, rubs, or gallops.  ABDOMEN: Soft, non-tender, non-distended. Bowel sounds present. No organomegaly or mass.  EXTREMITIES: No pedal edema, cyanosis, or clubbing.  NEUROLOGIC: Cranial nerves II through XII are intact. Muscle strength 5/5 in all extremitiesExcept right upper extremity and lower extremity 4 out of 5. Right facial droop. Sensation intact. Gait not checked.  PSYCHIATRIC: The patient is alert and oriented x 3.  SKIN: No obvious rash, lesion, or ulcer.   DATA REVIEW:   CBC  Recent Labs Lab 06/10/16 1334  WBC 5.1  HGB 13.7  HCT 39.3  PLT 213    Chemistries   Recent Labs Lab 06/10/16 1334  NA 138  K 3.8  CL 106  CO2 26  GLUCOSE 86  BUN 18  CREATININE 0.95  CALCIUM 8.8*  AST 35  ALT 20  ALKPHOS 48  BILITOT 0.5    Cardiac Enzymes  Recent Labs Lab 06/10/16 1334  [place] <0.03    Microbiology Results  Results for  orders placed or performed in visit on 05/19/16  Veritor Flu A/B Waived     Status: None   Collection Time: 05/19/16  2:20 PM  Result Value Ref Range Status   Influenza A Negative Negative Final   Influenza B Negative Negative Final    Comment: If the test is negative for the presence of influenza A or influenza B antigen, infection due to influenza cannot be ruled-out because the antigen present in the sample may be below the detection limit of the test. It is recommended that these results be confirmed by viral culture or an FDA-cleared influenza A and B molecular assay.     RADIOLOGY:  Ct Angio Head W Or Wo Contrast  Result Date: 06/10/2016 CLINICAL DATA:  Right arm weakness and facial droop.  Prior stroke. EXAM: CT ANGIOGRAPHY HEAD AND NECK TECHNIQUE: Multidetector CT imaging of the head and neck was performed using the standard protocol during bolus administration of intravenous contrast. Multiplanar CT image reconstructions and MIPs were obtained to evaluate the vascular anatomy. Carotid stenosis measurements (when applicable) are obtained utilizing NASCET criteria, using the distal internal carotid diameter as the denominator. CONTRAST:  75 mL Isovue 370 COMPARISON:  02/04/2016 CTA head and neck FINDINGS: CTA NECK FINDINGS Aortic arch: 3 vessel aortic arch. Arch vessel origins are widely patent. Right carotid system: Patent with unchanged, mild calcified plaque at the carotid bifurcation. No evidence of dissection or significant stenosis. Left carotid system: Patent with unchanged, mild calcified plaque at the carotid bifurcation. No evidence of dissection or significant stenosis. Vertebral arteries: Patent and codominant. No evidence of significant stenosis or dissection, however the left V1 segment is not well evaluated due to dense contrast and adjacent veins. Skeleton: Mild disc degeneration in the cervical spine. Other neck: No evidence of acute abnormality or mass.  Upper chest:  Visualized lung apices are grossly clear. Review of the MIP images confirms the above findings CTA HEAD FINDINGS Anterior circulation: The internal carotid arteries are widely patent from skullbase to carotid termini. ACAs and MCAs are patent without evidence of major branch occlusion or significant stenosis. No aneurysm. Posterior circulation: The visualized distal vertebral arteries are patent to the basilar without significant stenosis. PICA, SCA, and left AICA origins appear patent. A right AICA is not clearly identified. The basilar artery is patent without significant stenosis. There are patent posterior communicating arteries bilaterally, right larger than left, with a hypoplastic left P1 segment and hypoplastic or absent right P1 segment. PCAs are patent without evidence of significant stenosis. No aneurysm. Venous sinuses: Patent. Anatomic variants: Fetal type origin of the right PCA. Review of the MIP images confirms the above findings IMPRESSION: No emergent large vessel occlusion or significant stenosis in the head and neck. These results were called by telephone at the time of interpretation on 06/10/2016 at 2:10 pm to Dr. Conni Slipper , who verbally acknowledged these results. Electronically Signed   By: Logan Bores M.D.   On: 06/10/2016 14:11   Ct Angio Neck W And/or Wo Contrast  Result Date: 06/10/2016 CLINICAL DATA:  Right arm weakness and facial droop.  Prior stroke. EXAM: CT ANGIOGRAPHY HEAD AND NECK TECHNIQUE: Multidetector CT imaging of the head and neck was performed using the standard protocol during bolus administration of intravenous contrast. Multiplanar CT image reconstructions and MIPs were obtained to evaluate the vascular anatomy. Carotid stenosis measurements (when applicable) are obtained utilizing NASCET criteria, using the distal internal carotid diameter as the denominator. CONTRAST:  75 mL Isovue 370 COMPARISON:  02/04/2016 CTA head and neck FINDINGS: CTA NECK FINDINGS Aortic  arch: 3 vessel aortic arch. Arch vessel origins are widely patent. Right carotid system: Patent with unchanged, mild calcified plaque at the carotid bifurcation. No evidence of dissection or significant stenosis. Left carotid system: Patent with unchanged, mild calcified plaque at the carotid bifurcation. No evidence of dissection or significant stenosis. Vertebral arteries: Patent and codominant. No evidence of significant stenosis or dissection, however the left V1 segment is not well evaluated due to dense contrast and adjacent veins. Skeleton: Mild disc degeneration in the cervical spine. Other neck: No evidence of acute abnormality or mass. Upper chest: Visualized lung apices are grossly clear. Review of the MIP images confirms the above findings CTA HEAD FINDINGS Anterior circulation: The internal carotid arteries are widely patent from skullbase to carotid termini. ACAs and MCAs are patent without evidence of major branch occlusion or significant stenosis. No aneurysm. Posterior circulation: The visualized distal vertebral arteries are patent to the basilar without significant stenosis. PICA, SCA, and left AICA origins appear patent. A right AICA is not clearly identified. The basilar artery is patent without significant stenosis. There are patent posterior communicating arteries bilaterally, right larger than left, with a hypoplastic left P1 segment and hypoplastic or absent right P1 segment. PCAs are patent without evidence of significant stenosis. No aneurysm. Venous sinuses: Patent. Anatomic variants: Fetal type origin of the right PCA. Review of the MIP images confirms the above findings IMPRESSION: No emergent large vessel occlusion or significant stenosis in the head and neck. These results were called by telephone at the time of interpretation on 06/10/2016 at 2:10 pm to Dr. Conni Slipper , who verbally acknowledged these results. Electronically Signed   By: Logan Bores M.D.   On: 06/10/2016 14:11    Shirley Rogers  Contrast  Result Date: 06/10/2016 CLINICAL DATA:  Acute presentation with left-sided facial droop and right upper and lower extremity weakness. EXAM: MRI HEAD WITHOUT CONTRAST TECHNIQUE: Multiplanar, multiecho pulse sequences of the brain and surrounding structures were obtained without intravenous contrast. COMPARISON:  CT same day.  MRI 02/05/2016 FINDINGS: Brain: Considerable motion degradation. Diffusion imaging does not show any acute or subacute infarction. The brainstem and cerebellum are normal. Cerebral hemispheres show an old lacunar infarction in the right lateral basal ganglia/ external capsule. Few other old small-vessel deep white matter insults. Dilated perivascular space at the base of the brain on the left. No large vessel territory infarction. No mass lesion, hemorrhage, hydrocephalus or extra-axial collection. Vascular: Major vessels at the base of the brain show flow. Skull and upper cervical spine: Negative Sinuses/Orbits: Clear/normal Other: None significant IMPRESSION: No change. No acute finding. Old infarction right basal ganglia/ external capsule. Minimal small vessel change of the white matter. Electronically Signed   By: Nelson Chimes M.D.   On: 06/10/2016 16:04   Ct Head Code Stroke Wo Contrast`  Result Date: 06/10/2016 CLINICAL DATA:  Code stroke. Left facial droop. Right arm and leg weakness. Prior stroke. EXAM: CT HEAD WITHOUT CONTRAST TECHNIQUE: Contiguous axial images were obtained from the base of the skull through the vertex without intravenous contrast. COMPARISON:  Head CT 02/04/2016 and MRI 02/05/2016 FINDINGS: Brain: There is no evidence of acute cortical infarct, intracranial hemorrhage, mass, midline shift, or extra-axial fluid collection. The ventricles and sulci are normal. A dilated perivascular space is again seen inferiorly in the left basal ganglia. A chronic lacunar infarct at the posterior aspect of the right putamen is unchanged. Vascular: No  hyperdense vessel or unexpected calcification. Skull: No fracture or focal osseous lesion. Sinuses/Orbits: Visualized paranasal sinuses and mastoid air cells are clear. Orbits are unremarkable. Other: None. ASPECTS [facility] Stroke Program Early CT Score) - Ganglionic level infarction (caudate, lentiform nuclei, internal capsule, insula, M1-M3 cortex): 7 - Supraganglionic infarction (M4-M6 cortex): 3 Total score (0-10 with 10 being normal): 10 IMPRESSION: 1. No evidence of acute intracranial abnormality. 2. ASPECTS is 10. 3. Chronic right basal ganglia infarct. These results were called by telephone at the time of interpretation on 06/10/2016 at 1:32 pm to Dr. Alexis Goodell , who verbally acknowledged these results. Electronically Signed   By: Logan Bores M.D.   On: 06/10/2016 13:33    EKG:   Orders placed or performed during the hospital encounter of 06/10/16  . ED EKG  . ED EKG  . EKG 12-Lead  . EKG 12-Lead      Management plans discussed with the patient, family and they are in agreement.  CODE STATUS:     Code Status Orders        Start     Ordered   06/10/16 1744  Full code  Continuous     06/10/16 1743    Code Status History    Date Active Date Inactive Code Status Order ID Comments User Context   03/11/2016  1:31 AM 03/11/2016  4:28 PM Full Code [id number]  Harvie Bridge, DO Inpatient   02/04/2016 10:48 PM 02/05/2016  1:23 PM Full Code [id number]  Lance Coon, MD Inpatient    Advance Directive Documentation   Flowsheet Row Most Recent Value  Type of Advance Directive  Healthcare Power of Attorney, Living will  Pre-existing out of facility DNR order (yellow form or pink MOST form)  No data  "MOST" Form in Place?  No data  TOTAL TIME TAKING CARE OF THIS PATIENT: 43 minutes.   Note: This dictation was prepared with Dragon dictation along with smaller phrase technology. Any transcriptional errors that result from this process are unintentional.  Greater than 50%  time was spent on counseling and coordination of care  @[facility] @  on 06/11/2016 at 12:53 PM  Between 7am to 6pm - Pager - [phone]  After 6pm go to [email or web address] - password [email or web address] [place] [facility]  Office  [phone]  CC: Primary care physician; Park Liter, DO

## 2016-06-11 NOTE — Progress Notes (Signed)
[place], [place].   06/11/2016  Patient: Shirley Rogers   Date of Birth:  [birth year]  Date of admission:  06/10/2016  Date of Discharge  06/11/2016    To Whom it May Concern:   Noreene Filbert  May return to work on 06/13/16  PHYSICAL ACTIVITY:  Moderate  If you have any questions or concerns, please don't hesitate to call.  Sincerely,   Nicholes Mango M.D Pager [phone] Office : [phone]   .

## 2016-06-11 NOTE — Progress Notes (Signed)
Subjective: Patient much improved today.  No headache.    Objective: Current vital signs: BP 127/76 (BP Location: Right Arm)   Pulse 72   Temp 98.1 F (36.7 C) (Oral)   Resp 16   Ht 5\' 5"  (1.651 m)   Wt 119.5 kg (263 lb 6.4 oz)   LMP 07/01/2013   SpO2 96%   BMI 43.83 kg/m  Vital signs in last 24 hours: Temp:  [97.7 F (36.5 C)-98.7 F (37.1 C)] 98.1 F (36.7 C) (01/13 1058) Pulse Rate:  [68-82] 72 (01/13 1058) Resp:  [11-21] 16 (01/13 1058) BP: (107-158)/(61-97) 127/76 (01/13 1058) SpO2:  [92 %-100 %] 96 % (01/13 1058) FiO2 (%):  [21 %] 21 % (01/12 1744) Weight:  [115.7 kg (255 lb)-119.5 kg (263 lb 6.4 oz)] 119.5 kg (263 lb 6.4 oz) (01/12 1743)  Intake/Output from previous day: 01/12 0701 - 01/13 0700 In: 360 [P.O.:360] Out: -  Intake/Output this shift: Total I/O In: 240 [P.O.:240] Out: -  Nutritional status: Diet Heart Room service appropriate? Yes; Fluid consistency: Thin  Neurologic Exam: Mental Status: Alert, oriented, thought content appropriate.  Speech fluent without evidence of aphasia. Some mild dysarthria noted.  Able to follow 3 step commands without difficulty. Cranial Nerves: II: Discs flat bilaterally; Visual fields grossly normal, pupils equal, round, reactive to light and accommodation III,IV, VI: no ptosis, extra-ocular motions intact bilaterally V,VII: right facial droop VIII: hearing normal bilaterally IX,X: gag reflex present XI: bilateral shoulder shrug XII: midline tongue extension Motor: Right :  Upper extremity   5-/5                                     Left:     Upper extremity   5/5             Lower extremity   5-/5                                                  Lower extremity   5/5 Tone and bulk:normal tone throughout; no atrophy noted   Lab Results: Basic Metabolic Panel:  Recent Labs Lab 06/10/16 1334  NA 138  K 3.8  CL 106  CO2 26  GLUCOSE 86  BUN 18  CREATININE 0.95  CALCIUM 8.8*    Liver Function  Tests:  Recent Labs Lab 06/10/16 1334  AST 35  ALT 20  ALKPHOS 48  BILITOT 0.5  PROT 7.4  ALBUMIN 4.5   No results for input(s): LIPASE, AMYLASE in the last 168 hours. No results for input(s): AMMONIA in the last 168 hours.  CBC:  Recent Labs Lab 06/10/16 1334  WBC 5.1  NEUTROABS 3.3  HGB 13.7  HCT 39.3  MCV 89.0  PLT 213    Cardiac Enzymes:  Recent Labs Lab 06/10/16 1334  TROPONINI <0.03    Lipid Panel:  Recent Labs Lab 06/11/16 0558  CHOL 204*  TRIG 99  HDL 41  CHOLHDL 5.0  VLDL 20  LDLCALC 143*    CBG:  Recent Labs Lab 06/10/16 1329  GLUCAP 81    Microbiology: Results for orders placed or performed in visit on 05/19/16  Veritor Flu A/B Waived     Status: None   Collection Time: 05/19/16  2:20 PM  Result Value  Ref Range Status   Influenza A Negative Negative Final   Influenza B Negative Negative Final    Comment: If the test is negative for the presence of influenza A or influenza B antigen, infection due to influenza cannot be ruled-out because the antigen present in the sample may be below the detection limit of the test. It is recommended that these results be confirmed by viral culture or an FDA-cleared influenza A and B molecular assay.     Coagulation Studies:  Recent Labs  06/10/16 1334  LABPROT 13.4  INR 1.02    Imaging: Ct Angio Head W Or Wo Contrast  Result Date: 06/10/2016 CLINICAL DATA:  Right arm weakness and facial droop.  Prior stroke. EXAM: CT ANGIOGRAPHY HEAD AND NECK TECHNIQUE: Multidetector CT imaging of the head and neck was performed using the standard protocol during bolus administration of intravenous contrast. Multiplanar CT image reconstructions and MIPs were obtained to evaluate the vascular anatomy. Carotid stenosis measurements (when applicable) are obtained utilizing NASCET criteria, using the distal internal carotid diameter as the denominator. CONTRAST:  75 mL Isovue 370 COMPARISON:  02/04/2016 CTA  head and neck FINDINGS: CTA NECK FINDINGS Aortic arch: 3 vessel aortic arch. Arch vessel origins are widely patent. Right carotid system: Patent with unchanged, mild calcified plaque at the carotid bifurcation. No evidence of dissection or significant stenosis. Left carotid system: Patent with unchanged, mild calcified plaque at the carotid bifurcation. No evidence of dissection or significant stenosis. Vertebral arteries: Patent and codominant. No evidence of significant stenosis or dissection, however the left V1 segment is not well evaluated due to dense contrast and adjacent veins. Skeleton: Mild disc degeneration in the cervical spine. Other neck: No evidence of acute abnormality or mass. Upper chest: Visualized lung apices are grossly clear. Review of the MIP images confirms the above findings CTA HEAD FINDINGS Anterior circulation: The internal carotid arteries are widely patent from skullbase to carotid termini. ACAs and MCAs are patent without evidence of major branch occlusion or significant stenosis. No aneurysm. Posterior circulation: The visualized distal vertebral arteries are patent to the basilar without significant stenosis. PICA, SCA, and left AICA origins appear patent. A right AICA is not clearly identified. The basilar artery is patent without significant stenosis. There are patent posterior communicating arteries bilaterally, right larger than left, with a hypoplastic left P1 segment and hypoplastic or absent right P1 segment. PCAs are patent without evidence of significant stenosis. No aneurysm. Venous sinuses: Patent. Anatomic variants: Fetal type origin of the right PCA. Review of the MIP images confirms the above findings IMPRESSION: No emergent large vessel occlusion or significant stenosis in the head and neck. These results were called by telephone at the time of interpretation on 06/10/2016 at 2:10 pm to Dr. Conni Slipper , who verbally acknowledged these results. Electronically Signed    By: Logan Bores M.D.   On: 06/10/2016 14:11   Ct Angio Neck W And/or Wo Contrast  Result Date: 06/10/2016 CLINICAL DATA:  Right arm weakness and facial droop.  Prior stroke. EXAM: CT ANGIOGRAPHY HEAD AND NECK TECHNIQUE: Multidetector CT imaging of the head and neck was performed using the standard protocol during bolus administration of intravenous contrast. Multiplanar CT image reconstructions and MIPs were obtained to evaluate the vascular anatomy. Carotid stenosis measurements (when applicable) are obtained utilizing NASCET criteria, using the distal internal carotid diameter as the denominator. CONTRAST:  75 mL Isovue 370 COMPARISON:  02/04/2016 CTA head and neck FINDINGS: CTA NECK FINDINGS Aortic arch: 3  vessel aortic arch. Arch vessel origins are widely patent. Right carotid system: Patent with unchanged, mild calcified plaque at the carotid bifurcation. No evidence of dissection or significant stenosis. Left carotid system: Patent with unchanged, mild calcified plaque at the carotid bifurcation. No evidence of dissection or significant stenosis. Vertebral arteries: Patent and codominant. No evidence of significant stenosis or dissection, however the left V1 segment is not well evaluated due to dense contrast and adjacent veins. Skeleton: Mild disc degeneration in the cervical spine. Other neck: No evidence of acute abnormality or mass. Upper chest: Visualized lung apices are grossly clear. Review of the MIP images confirms the above findings CTA HEAD FINDINGS Anterior circulation: The internal carotid arteries are widely patent from skullbase to carotid termini. ACAs and MCAs are patent without evidence of major branch occlusion or significant stenosis. No aneurysm. Posterior circulation: The visualized distal vertebral arteries are patent to the basilar without significant stenosis. PICA, SCA, and left AICA origins appear patent. A right AICA is not clearly identified. The basilar artery is patent  without significant stenosis. There are patent posterior communicating arteries bilaterally, right larger than left, with a hypoplastic left P1 segment and hypoplastic or absent right P1 segment. PCAs are patent without evidence of significant stenosis. No aneurysm. Venous sinuses: Patent. Anatomic variants: Fetal type origin of the right PCA. Review of the MIP images confirms the above findings IMPRESSION: No emergent large vessel occlusion or significant stenosis in the head and neck. These results were called by telephone at the time of interpretation on 06/10/2016 at 2:10 pm to Dr. Conni Slipper , who verbally acknowledged these results. Electronically Signed   By: Logan Bores M.D.   On: 06/10/2016 14:11   Mr Brain Wo Contrast  Result Date: 06/10/2016 CLINICAL DATA:  Acute presentation with left-sided facial droop and right upper and lower extremity weakness. EXAM: MRI HEAD WITHOUT CONTRAST TECHNIQUE: Multiplanar, multiecho pulse sequences of the brain and surrounding structures were obtained without intravenous contrast. COMPARISON:  CT same day.  MRI 02/05/2016 FINDINGS: Brain: Considerable motion degradation. Diffusion imaging does not show any acute or subacute infarction. The brainstem and cerebellum are normal. Cerebral hemispheres show an old lacunar infarction in the right lateral basal ganglia/ external capsule. Few other old small-vessel deep white matter insults. Dilated perivascular space at the base of the brain on the left. No large vessel territory infarction. No mass lesion, hemorrhage, hydrocephalus or extra-axial collection. Vascular: Major vessels at the base of the brain show flow. Skull and upper cervical spine: Negative Sinuses/Orbits: Clear/normal Other: None significant IMPRESSION: No change. No acute finding. Old infarction right basal ganglia/ external capsule. Minimal small vessel change of the white matter. Electronically Signed   By: Nelson Chimes M.D.   On: 06/10/2016 16:04   Ct  Head Code Stroke Wo Contrast`  Result Date: 06/10/2016 CLINICAL DATA:  Code stroke. Left facial droop. Right arm and leg weakness. Prior stroke. EXAM: CT HEAD WITHOUT CONTRAST TECHNIQUE: Contiguous axial images were obtained from the base of the skull through the vertex without intravenous contrast. COMPARISON:  Head CT 02/04/2016 and MRI 02/05/2016 FINDINGS: Brain: There is no evidence of acute cortical infarct, intracranial hemorrhage, mass, midline shift, or extra-axial fluid collection. The ventricles and sulci are normal. A dilated perivascular space is again seen inferiorly in the left basal ganglia. A chronic lacunar infarct at the posterior aspect of the right putamen is unchanged. Vascular: No hyperdense vessel or unexpected calcification. Skull: No fracture or focal osseous lesion. Sinuses/Orbits: Visualized  paranasal sinuses and mastoid air cells are clear. Orbits are unremarkable. Other: None. ASPECTS [facility] Stroke Program Early CT Score) - Ganglionic level infarction (caudate, lentiform nuclei, internal capsule, insula, M1-M3 cortex): 7 - Supraganglionic infarction (M4-M6 cortex): 3 Total score (0-10 with 10 being normal): 10 IMPRESSION: 1. No evidence of acute intracranial abnormality. 2. ASPECTS is 10. 3. Chronic right basal ganglia infarct. These results were called by telephone at the time of interpretation on 06/10/2016 at 1:32 pm to Dr. Alexis Goodell , who verbally acknowledged these results. Electronically Signed   By: Logan Bores M.D.   On: 06/10/2016 13:33    Medications:  I have reviewed the patient's current medications. Scheduled: . apixaban  5 mg Oral BID  . azaTHIOprine  50 mg Oral BID  . busPIRone  15 mg Oral Daily  . citalopram  20 mg Oral Daily  . citalopram  40 mg Oral Daily  . furosemide  20 mg Oral Daily  . gabapentin  300 mg Oral TID  . hydrochlorothiazide  25 mg Oral QODAY  . pantoprazole  40 mg Oral Daily  . topiramate  200 mg Oral Daily  . traZODone  100 mg  Oral QHS  . verapamil  240 mg Oral Daily    Assessment/Plan: Patient much improved today with only some mild right sided weakness noted.  MRI of the brain reviewed and shows no acute changes.  Symptoms likely represent a migraine equivalent.    No further neurologic intervention is recommended at this time.  If further questions arise, please call or page at that time.  Thank you for allowing neurology to participate in the care of this patient.  Patient to follow up with her neurologist on an outpatient basis.    Alexis Goodell, MD Neurology [phone] 06/11/2016  12:52 PM   LOS: 1 day   Alexis Goodell, MD Neurology [phone] 06/11/2016  12:49 PM

## 2016-06-11 NOTE — Progress Notes (Addendum)
Pt receiving influenza vaccine. While completing the vaccine questions she stated she does have an allergy to latex. Reports it as a rash that she experienced "a long time ago". Denies any SOB, dyspnea, throat/tongue swelling, or low BP with latex in the past. She also states that she has received the flu shot in the past, and denies any allergic reaction to prior flu shot. She does report flu-like symptoms following administration of flu. I called and spoke to pharmacy and they stated that the immunization question is related to administering with latex gloves, not the vaccine itself. They stated it should be safe to administer. Pt aware, and agreeable to receiving vaccine. Vaccine administered.

## 2016-06-11 NOTE — Evaluation (Signed)
Occupational Therapy Evaluation Patient Details Name: Shirley Rogers MRN: [id number] DOB: 10-24-1965 Today's Date: 06/11/2016    History of Present Illness Pt is a 51 y.o. female with a known history of multiple medical problems including migraines, CVA, depression, essential HTN, HLD, who presented to the emergency room complaining of left-sided facial droop and right sided upper extremity and lower extremity weakness. Patient was seen in the emergency room in consultation by neurology. CTA of brain and CT scan of head were both negative.    Clinical Impression   Pt is a pleasant 51yo female presenting with slight R facial droop and R sided weakness. Pt able to perform bed mobility independently and required supervision for tranfers and functional mobility. Pt tolerated standing at sink for grooming tasks requiring bilateral coordination, in hand manipulation, and using R hand to hold toothbrush (dominant hand) with supervision for safety. Pt stated "I feel like I'm in a cave and everyone else is outside of the cave. I feel like part of me is here but part of me is farther away." Pt report no c/o dizziness, pain, or visual changes. Recommend pt return home with initial supervision (which pt reports she will have) and outpatient OT to address noted impairments. All further OT needs can be addressed at next venue.     Follow Up Recommendations  Outpatient OT    Equipment Recommendations  Tub/shower seat    Recommendations for Other Services       Precautions / Restrictions Precautions Precautions: Fall Restrictions Weight Bearing Restrictions: No      Mobility Bed Mobility Overal bed mobility: Independent                Transfers Overall transfer level: Needs assistance Equipment used: None Transfers: Sit to/from Stand Sit to Stand: Supervision         General transfer comment: pt performed sit<>stand from EOB and ambulated 3 feet to sink with supervision and no LOB  noted but pt reported feeling like R side was "sort of clunky"    Balance Overall balance assessment: Needs assistance   Sitting balance-Leahy Scale: Normal       Standing balance-Leahy Scale: Good                              ADL Overall ADL's : Needs assistance/impaired                                     Functional mobility during ADLs: Supervision/safety General ADL Comments: Pt high functioning, but would benefit from supervision for safety during bathing, tub transfers initially at home     Vision Vision Assessment?: No apparent visual deficits   Perception     Praxis      Pertinent Vitals/Pain Pain Assessment: No/denies pain     Hand Dominance Right   Extremity/Trunk Assessment Upper Extremity Assessment Upper Extremity Assessment: RUE deficits/detail RUE Deficits / Details: full ROM; 3+/5   Lower Extremity Assessment Lower Extremity Assessment: Defer to PT evaluation   Cervical / Trunk Assessment Cervical / Trunk Assessment: Normal   Communication Communication Communication: No difficulties   Cognition Arousal/Alertness: Awake/alert Behavior During Therapy: WFL for tasks assessed/performed Overall Cognitive Status: Within Functional Limits for tasks assessed                     General Comments  Exercises       Shoulder Instructions      Home Living Family/patient expects to be discharged to:: Private residence Living Arrangements: Non-relatives/Friends Available Help at Discharge: Friend(s) Type of Home: House Home Access: Stairs to enter [employer] of Steps: one step. Entrance Stairs-Rails: Can reach both Home Layout: One level     Bathroom Shower/Tub: Tub/shower unit;Curtain Shower/tub characteristics: Architectural technologist: Standard Bathroom Accessibility: Yes How Accessible: Accessible via walker Home Equipment: Cane - single point          Prior  Functioning/Environment Level of Independence: Independent        Comments: Indep with ADLs, household and community mobility; + driving; working full-time        OT Problem List: Decreased strength;Impaired UE functional use;Impaired balance (sitting and/or standing);Decreased activity tolerance   OT Treatment/Interventions:      OT Goals(Current goals can be found in the care plan section) Acute Rehab OT Goals Patient Stated Goal: go home OT Goal Formulation: With patient Time For Goal Achievement: 06/25/16 Potential to Achieve Goals: Good  OT Frequency:     Barriers to D/C:            Co-evaluation              End of Session Equipment Utilized During Treatment: Gait belt  Activity Tolerance: Patient tolerated treatment well Patient left: in bed;with call bell/phone within reach   Time: 1036-1051 OT Time Calculation (min): 15 min Charges:  OT General Charges $OT Visit: 1 Procedure OT Evaluation $OT Eval Low Complexity: 1 Procedure G-Codes: OT G-codes **NOT FOR INPATIENT CLASS** Functional Assessment Tool Used: clinical judgment Functional Limitation: Self care Self Care Current Status [id number]): At least 1 percent but less than 20 percent impaired, limited or restricted Self Care Goal Status [id number]): At least 1 percent but less than 20 percent impaired, limited or restricted Self Care Discharge Status [phone]): At least 1 percent but less than 20 percent impaired, limited or restricted  Corky Sox, OTR/L 06/11/2016, 11:23 AM

## 2016-06-11 NOTE — Evaluation (Signed)
Physical Therapy Evaluation Patient Details Name: Shirley Rogers MRN: [id number] DOB: [birth year] Today's Date: 06/11/2016   History of Present Illness  Pt is a 51 y.o. female with a known history of multiple medical problems including migraines, CVA, depression, essential HTN, HLD, who presented to the emergency room complaining of left-sided facial droop and right sided upper extremity and lower extremity weakness. Patient was seen in the emergency room in consultation by neurology. CTA of brain and CT scan of head were both negative.   Clinical Impression  Pt is a pleasant 51 year old female who was admitted for R hemiplegia with CVA work up. Pt reporting R side deficits have improved since admission date. Pt performs bed mobility with indepedence, transfers with supervision, and ambulation with cga and no AD. Pt demonstrates deficits with R side strength/balance/dizziness. Pt has full sensation and has good coordination. Would benefit from skilled PT to address above deficits and promote optimal return to PLOF. Pt would benefit from further OP PT services, discussed with patient.      Follow Up Recommendations Outpatient PT    Equipment Recommendations  None recommended by PT    Recommendations for Other Services       Precautions / Restrictions Precautions Precautions: Fall Restrictions Weight Bearing Restrictions: No      Mobility  Bed Mobility Overal bed mobility: Independent             General bed mobility comments: safe technique performed. Slight dizziness noted with upright posture  Transfers Overall transfer level: Needs assistance Equipment used: None Transfers: Sit to/from Stand Sit to Stand: Supervision         General transfer comment: safe technique with upright posture. Slight support on B posterior legs against bed with standing  Ambulation/Gait Ambulation/Gait assistance: Min guard Ambulation Distance (Feet): 90 Feet Assistive device:  None Gait Pattern/deviations: Step-through pattern     General Gait Details: Pt ambulated using no AD in hallway with slight decreased step length on R side with decreased arm swing. Pt reports slight dizziness with mobility, however no formal LOB noted. Safe technique during turns  Financial trader Rankin (Stroke Patients Only)       Balance Overall balance assessment: Needs assistance;History of Falls Sitting-balance support: Feet supported Sitting balance-Leahy Scale: Normal     Standing balance support: No upper extremity supported Standing balance-Leahy Scale: Fair                               Pertinent Vitals/Pain Pain Assessment: No/denies pain    Home Living Family/patient expects to be discharged to:: Private residence Living Arrangements: Non-relatives/Friends (has roommate) Available Help at Discharge: Friend(s) Type of Home: House Home Access: Stairs to enter Entrance Stairs-Rails: Can reach both Entrance Stairs-Number of Steps: one step. Home Layout: One level Home Equipment: Cane - single point      Prior Function Level of Independence: Independent         Comments: Indep with ADLs, household and community mobility; + driving; working full-time     Journalist, newspaper   Dominant Hand: Right    Extremity/Trunk Assessment   Upper Extremity Assessment Upper Extremity Assessment: RUE deficits/detail RUE Deficits / Details: full ROM; 3+/5 RUE Sensation:  (full sensation) RUE Coordination:  (WNL)    Lower Extremity Assessment Lower Extremity Assessment: RLE deficits/detail;Generalized weakness RLE Deficits / Details:  R LE grosly 3+/5 with full sensation/coordination    Cervical / Trunk Assessment Cervical / Trunk Assessment: Normal  Communication   Communication: No difficulties  Cognition Arousal/Alertness: Awake/alert Behavior During Therapy: WFL for tasks assessed/performed Overall  Cognitive Status: Within Functional Limits for tasks assessed                      General Comments      Exercises     Assessment/Plan    PT Assessment Patient needs continued PT services  PT Problem List Decreased strength;Decreased balance;Decreased mobility;Decreased safety awareness;Decreased knowledge of precautions          PT Treatment Interventions Gait training;DME instruction;Therapeutic exercise    PT Goals (Current goals can be found in the Care Plan section)  Acute Rehab PT Goals Patient Stated Goal: go home PT Goal Formulation: With patient Time For Goal Achievement: 06/25/16 Potential to Achieve Goals: Good    Frequency Min 2X/week   Barriers to discharge        Co-evaluation               End of Session Equipment Utilized During Treatment: Gait belt Activity Tolerance: Patient tolerated treatment well Patient left: in bed;with bed alarm set;with nursing/sitter in room Nurse Communication: Mobility status    Functional Assessment Tool Used: clinical judgement Functional Limitation: Mobility: Walking and moving around Mobility: Walking and Moving Around Current Status [id number]): At least 20 percent but less than 40 percent impaired, limited or restricted Mobility: Walking and Moving Around Goal Status [phone]): 0 percent impaired, limited or restricted    Time: 1020-1032 PT Time Calculation (min) (ACUTE ONLY): 12 min   Charges:   PT Evaluation $PT Eval Low Complexity: 1 Procedure     PT G Codes:   PT G-Codes **NOT FOR INPATIENT CLASS** Functional Assessment Tool Used: clinical judgement Functional Limitation: Mobility: Walking and moving around Mobility: Walking and Moving Around Current Status [id number]): At least 20 percent but less than 40 percent impaired, limited or restricted Mobility: Walking and Moving Around Goal Status [phone]): 0 percent impaired, limited or restricted    [name] 06/11/2016, 12:35 PM  Shirley Rogers,  PT, DPT [phone]

## 2016-06-11 NOTE — Progress Notes (Signed)
*  PRELIMINARY RESULTS* Echocardiogram 2D Echocardiogram has been performed.  Shirley Rogers 06/11/2016, 11:56 AM

## 2016-06-11 NOTE — Care Management Note (Signed)
Case Management Note  Patient Details  Name: Shirley Rogers MRN: [id number] Date of Birth: 13-Jul-1965  Subjective/Objective:         Discussed OP-PT with [name]. She verbalized understanding that on Monday  She can call either her PCP's office or to the [facility] and request to be scheduled for OP-PT.  No other home health needs identified.        Action/Plan:   Expected Discharge Date:  06/11/16               Expected Discharge Plan:     In-House Referral:     Discharge planning Services     Post Acute Care Choice:    Choice offered to:     DME Arranged:    DME Agency:     HH Arranged:    HH Agency:     Status of Service:     If discussed at [employer], dates discussed:    Additional Comments:  Aspynn Clover A, RN 06/11/2016, 12:39 PM

## 2016-06-11 NOTE — Progress Notes (Signed)
CH responded to an OR for an AD. Ptwas awake and alert. Pt was educated on the AD. Pt is expecting to be discharged today and will complete the AD on her own. CH is available for follow up as needed.    06/11/16 1000  Clinical Encounter Type  Visited With Patient  Visit Type Initial;Spiritual support  Referral From Nurse  Spiritual Encounters  Spiritual Needs Literature

## 2016-06-11 NOTE — Progress Notes (Signed)
Patient is to be discharged home today. Patient is in no acute distress at this time, and assessment is unchanged from this morning. Patient's IV is out, discharge paperwork has been discussed with patient/family, and there are no questions or concerns at this time. Patient will be accompanied downstairs by staff and family via wheelchair.

## 2016-06-11 NOTE — Discharge Instructions (Signed)
Outpatient PT and occupational therapy Follow-up with primary care physician in a week Follow-up with neurologist Dr. Manuella Ghazi in 2 weeks

## 2016-06-11 NOTE — Progress Notes (Signed)
SLP Cancellation Note  Patient Details Name: Shirley Rogers MRN: [id number] DOB: 24-Aug-1965   Cancelled treatment:       Reason Eval/Treat Not Completed: SLP screened, no needs identified, will sign off   Chart reviewed, nursing and pt interviewed and all agree that pt's cognitive linguistic function is within the normal range.    [name] 06/11/2016, 10:40 AM

## 2016-06-12 LAB — ECHOCARDIOGRAM COMPLETE
HEIGHTINCHES: 65 in
WEIGHTICAEL: 4214.4 [oz_av]

## 2016-06-12 LAB — HEMOGLOBIN A1C
HEMOGLOBIN A1C: 5.3 % (ref 4.8–5.6)
Mean Plasma Glucose: 105 mg/dL

## 2016-06-13 ENCOUNTER — Telehealth: Payer: Self-pay | Admitting: Neurology

## 2016-06-13 DIAGNOSIS — G4733 Obstructive sleep apnea (adult) (pediatric): Secondary | ICD-10-CM

## 2016-06-13 DIAGNOSIS — G4739 Other sleep apnea: Secondary | ICD-10-CM

## 2016-06-13 DIAGNOSIS — G4731 Primary central sleep apnea: Principal | ICD-10-CM

## 2016-06-13 NOTE — Procedures (Signed)
PATIENT'S NAME:  Shirley Rogers, Shirley Rogers DOB:      1966-04-14      MR#:    [id number]     DATE OF RECORDING: 06/05/2016 REFERRING M.D.:  Park Liter, DO Study Performed:   CPAP  Titration HISTORY:  Pt returning following previous PSG from 12/02/2015 for review of hypoxemia ,titration on PAP, possible oxygen titration.  Patient has RLS, migraines/hemiplegic, post concussion syndrome.   The patient endorsed the Epworth Sleepiness Scale at 9 points and the Fatigue Score at 46 points.   The patient's weight 231 pounds with a height of 65 (inches), resulting in a BMI of 38.6 kg/m2. The patient's neck circumference measured 15 inches.  CURRENT MEDICATIONS: Proventil, Buspar, Celexa, Flexeril, Benadryl, Epipen, Neurontin, Xylocaine, Reglan, Prilosec, Topamax, Ultram, Desyrel    PROCEDURE:  This is a multichannel digital polysomnogram utilizing the SomnoStar 11.2 system.  Electrodes and sensors were applied and monitored per AASM Specifications.   EEG, EOG, Chin and Limb EMG, were sampled at 200 Hz.  ECG, Snore and Nasal Pressure, Thermal Airflow, Respiratory Effort, CPAP Flow and Pressure, Oximetry was sampled at 50 Hz. Digital video and audio were recorded.      CPAP was initiated at 5 cmH20 with heated humidity per AASM split night standards and pressure was advanced to 12/8cmH20 because of hypopneas, apneas and desaturations.  At a PAP pressure of 8 cmH20, there was a reduction of the AHI to 0 with improvement of the above symptoms of obstructive sleep apnea.    Lights Out was at 22:35 and Lights On at 05:29. Total recording time (TRT) was 415 minutes, with a total sleep time (TST) of 386 minutes. The patient's sleep latency was 25 minutes with 0 minutes of wake time after sleep onset. REM latency was 238.5 minutes.  The sleep efficiency was 93. %.    SLEEP ARCHITECTURE: WASO (Wake after sleep onset)  was 4 minutes.  There were 6 minutes in Stage N1, 104.5 minutes Stage N2, 222 minutes Stage N3 and 53.5 minutes  in Stage REM.  The percentage of Stage N1 was 1.6%, Stage N2 was 27.1%, Stage N3 was 57.5% and Stage R (REM sleep) was 13.9%. The sleep architecture was REM void until 3 AM.    RESPIRATORY ANALYSIS:  There were 0 respiratory events. The patient also had 0 respiratory event related arousals (RERAs).     The total APNEA/HYPOPNEA INDEX  (AHI) was 0 /hour and the total RESPIRATORY DISTURBANCE INDEX was 0 .hour  0 events occurred in REM sleep and 0 events in NREM. The REM AHI was 0 /hour versus a non-REM AHI of 0 /hour.  The patient spent 137.5 minutes of total sleep time in the supine position and 249 minutes in non-supine. The supine AHI was 0.0, versus a non-supine AHI of 0.0.  OXYGEN SATURATION & C02:  The baseline 02 saturation was 96%, with the lowest being 83%. Time spent below 89% saturation equaled 3 minutes.  PERIODIC LIMB MOVEMENTS:    The patient had a total of 10 Periodic Limb Movements. The Periodic Limb Movement (PLM) index was 1.6 and the PLM Arousal index was 0.3 /hour.  The patient was fitted with a Resmed AirFit P10 small nasal pillows apparatus.  DIAGNOSIS 1. . Obstructive Sleep Apnea, responding to CPAP at 10 cm water.  2. This time no treatment emergent central apnea was seen. The patient was briefly tried on BiPAP, but had little benefit .  3. REM sleep vocalizations noted, movements, possible REM BD. No  PLMs.   PLANS/RECOMMENDATIONS: Encourage Weight loss and Exercise regimen.   (ESS= 9 and BMI= 38.6).  Frequent snoring noted at and required higher levels of CPAP to resolve. Hypoxia resolved under CPAP.   Patient will use a nasal pillow Airfit P10 in small size and CPAP at 10 cm water pressure. CPAP at 10 cm water pressure with 1 cm EPR . A follow up appointment will be scheduled in the Sleep Clinic at [facility].   Please call [phone] with any questions.      I certify that I have reviewed the entire raw data recording prior to the issuance of  this report in accordance with the Standards of Accreditation of the American Academy of Sleep Medicine (AASM)    Larey Seat, M.D.  06-13-2016 Diplomat, American Board of Psychiatry and Neurology  Diplomat, [employer] Medical Director, [place] [facility] at [facility]

## 2016-06-17 ENCOUNTER — Other Ambulatory Visit: Payer: Self-pay | Admitting: Family Medicine

## 2016-06-17 NOTE — Telephone Encounter (Signed)
Routing to provider  

## 2016-06-17 NOTE — Telephone Encounter (Signed)
-----   Message from Larey Seat, MD sent at 06/13/2016  6:16 PM EST ----- The patient was fitted with a Resmed AirFit P10 small nasal pillows apparatus.  DIAGNOSIS 1. o Obstructive Sleep Apnea, responding to CPAP at 10 cm water.  2. This time no treatment emergent central apnea was seen. The patient was briefly tried on BiPAP, but had only some benefit under BiPAP  ST 12/8 and 10 /min.  .  3. REM sleep vocalizations noted, movements, possible REM BD. No PLMs.   PLANS/RECOMMENDATIONS: Encourage Weight loss and Exercise regimen.   (ESS= 9 and BMI= 38.6).  Frequent snoring noted at and required higher levels of CPAP to resolve. Hypoxia resolved under CPAP.   Patient will use a nasal pillow Airfit P10 in small size and CPAP at 10 cm water pressure. CPAP at 10 cm water pressure with 1 cm EPR .  please provide a Device that can also function as BiPAP and BiPAP ST if necessary.  A follow up appointment will be scheduled in the Sleep Clinic at [facility].   Please call [phone] with any questions.

## 2016-06-17 NOTE — Telephone Encounter (Signed)
I called pt to discuss sleep study results. No answer, left a message asking her to call me back. 

## 2016-06-21 NOTE — Telephone Encounter (Signed)
I called pt to discuss sleep study results. No answer, left a message asking her to call me back. 

## 2016-06-22 ENCOUNTER — Other Ambulatory Visit: Payer: Self-pay | Admitting: Family Medicine

## 2016-06-22 NOTE — Telephone Encounter (Signed)
I called pt. I advised her that her osa responded to cpap at 10 cm H2O and that Dr. Brett Fairy does recommend that she start a cpap at 10 cm H2O at home. I advised her that during this sleep study, no treatment emergent central apneas were noted. I advised her that REM sleep vocalizations were noted, some movements, suggesting possible REM BD. No PLMs were seen. I advised pt to consider weight loss and an exercise regimen. Pt already has a cpap at home and is using [facility]. Will send order to [facility]. Pt verbalized understanding of results. Pt is agreeable to a follow up appt on 08/25/16 with Dr. Brett Fairy at 11:00am. Pt had no questions at this time but was encouraged to call back if questions arise.

## 2016-06-24 ENCOUNTER — Other Ambulatory Visit: Payer: Self-pay | Admitting: Family Medicine

## 2016-06-24 MED ORDER — TRAMADOL HCL 50 MG PO TABS: 50.0000 mg | ORAL_TABLET | Freq: Two times a day (BID) | ORAL | 0 refills | Status: DC | PRN

## 2016-07-06 ENCOUNTER — Encounter: Payer: Self-pay | Admitting: Family Medicine

## 2016-07-15 ENCOUNTER — Other Ambulatory Visit: Payer: Self-pay | Admitting: Family Medicine

## 2016-07-20 ENCOUNTER — Inpatient Hospital Stay: Payer: BC Managed Care – PPO | Attending: Hematology and Oncology | Admitting: Hematology and Oncology

## 2016-07-20 ENCOUNTER — Encounter: Payer: Self-pay | Admitting: Hematology and Oncology

## 2016-07-20 VITALS — BP 123/83 | HR 73 | Temp 97.6°F | Resp 18 | Wt 245.6 lb

## 2016-07-20 DIAGNOSIS — G4733 Obstructive sleep apnea (adult) (pediatric): Secondary | ICD-10-CM | POA: Diagnosis not present

## 2016-07-20 DIAGNOSIS — F329 Major depressive disorder, single episode, unspecified: Secondary | ICD-10-CM

## 2016-07-20 DIAGNOSIS — Z87820 Personal history of traumatic brain injury: Secondary | ICD-10-CM

## 2016-07-20 DIAGNOSIS — M797 Fibromyalgia: Secondary | ICD-10-CM | POA: Diagnosis not present

## 2016-07-20 DIAGNOSIS — Z87891 Personal history of nicotine dependence: Secondary | ICD-10-CM

## 2016-07-20 DIAGNOSIS — Z79899 Other long term (current) drug therapy: Secondary | ICD-10-CM | POA: Diagnosis not present

## 2016-07-20 DIAGNOSIS — Z86711 Personal history of pulmonary embolism: Secondary | ICD-10-CM

## 2016-07-20 DIAGNOSIS — M1990 Unspecified osteoarthritis, unspecified site: Secondary | ICD-10-CM | POA: Diagnosis not present

## 2016-07-20 DIAGNOSIS — K589 Irritable bowel syndrome without diarrhea: Secondary | ICD-10-CM

## 2016-07-20 DIAGNOSIS — Z7902 Long term (current) use of antithrombotics/antiplatelets: Secondary | ICD-10-CM

## 2016-07-20 DIAGNOSIS — M352 Behcet's disease: Secondary | ICD-10-CM | POA: Diagnosis not present

## 2016-07-20 DIAGNOSIS — Z8673 Personal history of transient ischemic attack (TIA), and cerebral infarction without residual deficits: Secondary | ICD-10-CM | POA: Diagnosis not present

## 2016-07-20 DIAGNOSIS — Z882 Allergy status to sulfonamides status: Secondary | ICD-10-CM | POA: Diagnosis not present

## 2016-07-20 DIAGNOSIS — I7300 Raynaud's syndrome without gangrene: Secondary | ICD-10-CM | POA: Diagnosis not present

## 2016-07-20 DIAGNOSIS — Z7901 Long term (current) use of anticoagulants: Secondary | ICD-10-CM | POA: Diagnosis not present

## 2016-07-20 DIAGNOSIS — Z86718 Personal history of other venous thrombosis and embolism: Secondary | ICD-10-CM | POA: Diagnosis present

## 2016-07-20 DIAGNOSIS — G43909 Migraine, unspecified, not intractable, without status migrainosus: Secondary | ICD-10-CM | POA: Diagnosis not present

## 2016-07-20 DIAGNOSIS — Z87442 Personal history of urinary calculi: Secondary | ICD-10-CM

## 2016-07-20 DIAGNOSIS — I10 Essential (primary) hypertension: Secondary | ICD-10-CM

## 2016-07-20 DIAGNOSIS — Z888 Allergy status to other drugs, medicaments and biological substances status: Secondary | ICD-10-CM | POA: Diagnosis not present

## 2016-07-20 DIAGNOSIS — I2510 Atherosclerotic heart disease of native coronary artery without angina pectoris: Secondary | ICD-10-CM

## 2016-07-20 DIAGNOSIS — E785 Hyperlipidemia, unspecified: Secondary | ICD-10-CM

## 2016-07-20 DIAGNOSIS — R531 Weakness: Secondary | ICD-10-CM

## 2016-07-20 DIAGNOSIS — K219 Gastro-esophageal reflux disease without esophagitis: Secondary | ICD-10-CM | POA: Diagnosis not present

## 2016-07-20 DIAGNOSIS — I2699 Other pulmonary embolism without acute cor pulmonale: Secondary | ICD-10-CM
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
## 2016-07-20 NOTE — Progress Notes (Signed)
Patient states she has generalized pain in her legs, back and neck.  She is very fatigued, wants to sleep all the time.  States her hands and feet fall asleep.  She is currently on antibiotics for double ear infections. States she is nauseated and dizzy today.

## 2016-07-22 ENCOUNTER — Ambulatory Visit: Payer: BC Managed Care – PPO | Admitting: Urology

## 2016-07-22 DIAGNOSIS — R300 Dysuria: Secondary | ICD-10-CM | POA: Insufficient documentation

## 2016-07-22 DIAGNOSIS — N200 Calculus of kidney: Secondary | ICD-10-CM | POA: Insufficient documentation

## 2016-07-22 NOTE — Progress Notes (Deleted)
07/22/2016 12:50 PM   Shirley Rogers December 24, 1965 [id number]  Referring provider: Valerie Roys, DO [place], [place] [address]  CC: Follow up hematira, caruncle, nephrolithiasis, chronic dysuria  HPI:  1. Microhematuria - blood on UA x many. Eval 2017 with CT and cysto with Lt>Rt non-obstructing renal stones as per below.   2. Urethral caruncle - on estrace PRN for caruncle.   3. Nephrolithiasis - incidental non-obstructing stones on hematuria CT 2017. She is on topomax. No piror stone composition data.   4. 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 Had DMSO and HOD in [place] which were of course not effective. We have suggested pelvic PT previously.  PMH sig for [facility] Leiden / PE / blood thinners, CVA / hemiplegic migraine, morbid obesity.  Today "Shirley Rogers" is seen in f/u above. No interval gross hematuria / stone passage / febrile UTI.   PMH: Past Medical History:  Diagnosis Date  . Anxiety    on xanax in the past  . ASCVD (arteriosclerotic cardiovascular disease)   . Barrett esophagus   . 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 Carotid atherosclerosis 2003   on ultrasound  . Collagen vascular disease ([place])   . Colon polyp   . Complicated migraine    with facial drooping  . Concussion    1974,1982,1987,2016,2016  . DDD (degenerative disc disease), lumbar    MRI 2008, L3-4, L5-S1, spondylotic changes, multilevel facet joint hypertrophic changes  . Depression    on paxil, lexapro, cymbalta in the past  . Diverticulosis   . Dizziness due to old head injury   . Family history of adverse reaction to anesthesia    father - slow to wake  . Fatty liver 2013   On CT abdomen/pelvis  . Fibrocystic breast disease   . GERD (gastroesophageal reflux disease)   . Hemiplegic migraine    daily  . Hemorrhoids   . Hyperlipidemia   . Hypertension    no meds currently.  . IBS (irritable bowel syndrome)   . Insomnia chronic  . Kidney stone 2013   on CT  abdomen/pelvis  . 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 Migraine   . Narrowing of intervertebral disc space 2011   C5-6  . Normal cardiac stress test 2009   EF 66%  . Pulmonary emboli ([place])   . Recurrent sinusitis   . Recurrent UTI    question about IC in the past  . Sleep apnea    CPAP machine broken  . Stroke ([place])    2000 - no deficits  . Umbilical hernia 0000000   on CT abdomen/pelvis  . Varicose veins    right lower leg  . Vascular spasm ([place])   . Vertigo    from concussive disorder.  seeing neuro 03/23/15    Surgical History: Past Surgical History:  Procedure Laterality Date  . Biopsy Punch Thyroid    . BREAST EXCISIONAL BIOPSY Right 2000   right mass excision - atypical hyperplasia  . BREAST SURGERY Right    atypical hyperplasia  . CHOLECYSTECTOMY N/A 07/24/2015   Procedure: LAPAROSCOPIC CHOLECYSTECTOMY WITH INTRAOPERATIVE CHOLANGIOGRAM;  Surgeon: Leonie Green, MD;  Location: [facility] ORS;  Service: General;  Laterality: N/A;  . COLONOSCOPY W/ BIOPSIES     Removed 5 polyps  . TUBAL LIGATION      Home Medications:  Allergies as of 07/22/2016  Reactions   Rocephin [ceftriaxone] Anaphylaxis   Statins Anaphylaxis, Diarrhea, Nausea And Vomiting   Compazine [prochlorperazine] Other (See Comments)   akathesia akathesia akathesia   Doxycycline Nausea And Vomiting   Erythromycin Itching, Nausea And Vomiting, Rash   Patient states she is allergic to all mycin drugs   Sulfa Antibiotics Diarrhea, Nausea And Vomiting   Meloxicam    Sulfamethoxazole-trimethoprim Other (See Comments)   Ace Inhibitors Itching   Biaxin [clarithromycin] Itching, Nausea And Vomiting, Rash   Latex Rash   Morphine And Related Nausea And Vomiting      Medication List       Accurate as of 07/22/16 12:50 PM. Always use your most recent med list.          amoxicillin 500 MG tablet Commonly known as:  AMOXIL Take 500  mg by mouth 2 (two) times daily.   apixaban 5 MG Tabs tablet Commonly known as:  ELIQUIS Take 1 tablet (5 mg total) by mouth 2 (two) times daily.   azaTHIOprine 50 MG tablet Commonly known as:  IMURAN Take 50 mg by mouth 2 (two) times daily.   b complex vitamins tablet Take 1 tablet by mouth daily.   benzonatate 200 MG capsule Commonly known as:  TESSALON Take 1 capsule (200 mg total) by mouth 2 (two) times daily as needed for cough.   busPIRone 5 MG tablet Commonly known as:  BUSPAR Take 3 tablets (15 mg total) by mouth daily.   chlorpheniramine-HYDROcodone 10-8 MG/5ML Suer Commonly known as:  TUSSIONEX PENNKINETIC ER Take 5 mLs by mouth at bedtime as needed.   citalopram 40 MG tablet Commonly known as:  CELEXA TAKE 1 AND 1/2 TABLETS(60 MG) BY MOUTH DAILY   citalopram 20 MG tablet Commonly known as:  CELEXA TAKE 1 AND 1/2 TABLETS(30 MG) BY MOUTH DAILY   diphenhydrAMINE 25 mg capsule Commonly known as:  BENADRYL Take 2 capsules (50 mg total) by mouth every 6 (six) hours as needed.   EPINEPHrine 0.3 mg/0.3 mL Soaj injection Commonly known as:  EPIPEN 2-PAK Inject 0.3 mLs (0.3 mg total) into the muscle once.   furosemide 20 MG tablet Commonly known as:  LASIX TAKE 1 TABLET(20 MG) BY MOUTH DAILY   gabapentin 300 MG capsule Commonly known as:  NEURONTIN Take 300 mg by mouth 3 (three) times daily.   hydrochlorothiazide 25 MG tablet Commonly known as:  HYDRODIURIL Take 25 mg by mouth every other day.   Magnesium Oxide 140 MG Caps Take 280 mg by mouth daily. Reported on 05/14/2015   omeprazole 40 MG capsule Commonly known as:  PRILOSEC Take 40 mg by mouth daily.   PROAIR HFA 108 (90 Base) MCG/ACT inhaler Generic drug:  albuterol INHALE 2 PUFFS BY MOUTH EVERY 4 HOURS AS NEEDED FOR WHEEZING   T.E.D. THIGH LENGTH/L-REGULAR Misc 2 application by Does not apply route every morning.   topiramate 200 MG tablet Commonly known as:  TOPAMAX Take 200 mg by mouth  daily.   traMADol 50 MG tablet Commonly known as:  ULTRAM Take 1 tablet (50 mg total) by mouth 2 (two) times daily as needed.   traZODone 100 MG tablet Commonly known as:  DESYREL TAKE 1 TABLET(100 MG) BY MOUTH AT BEDTIME AS NEEDED FOR SLEEP   verapamil 240 MG CR tablet Commonly known as:  CALAN-SR Take 240 mg by mouth daily.       Allergies:  Allergies  Allergen Reactions  . Rocephin [Ceftriaxone] Anaphylaxis  . Statins Anaphylaxis, Diarrhea and  Nausea And Vomiting  . Compazine [Prochlorperazine] Other (See Comments)    akathesia akathesia akathesia  . Doxycycline Nausea And Vomiting  . Erythromycin Itching, Nausea And Vomiting and Rash    Patient states she is allergic to all mycin drugs  . Sulfa Antibiotics Diarrhea and Nausea And Vomiting  . Meloxicam   . Sulfamethoxazole-Trimethoprim Other (See Comments)  . Ace Inhibitors Itching  . Biaxin [Clarithromycin] Itching, Nausea And Vomiting and Rash  . Latex Rash  . Morphine And Related Nausea And Vomiting    Family History: Family History  Problem Relation Age of Onset  . Other Mother     IBS  . Hypertension Mother   . Heart disease Father   . Diverticulosis Father   . Migraines Father   . Other Son     Ulcerative Colitis, Polonidal cyst  . Parkinson's disease Maternal Grandmother   . Cancer Maternal Grandmother   . Mental illness Paternal Grandmother   . Stroke Paternal Grandmother   . Cancer Paternal Grandmother     brain cancer  . Bipolar disorder Paternal Grandmother   . Stroke Maternal Grandfather   . Cancer Maternal Grandfather     Bladder, Prostate/bladder/prostate  . Heart disease Paternal Grandfather   . Heart attack Paternal Grandfather   . Macular degeneration    . Diabetes Brother   . Breast cancer Maternal Aunt     Social History:  reports that she quit smoking about 13 months ago. Her smoking use included Cigarettes. She has a 0.25 pack-year smoking history. She has never used smokeless  tobacco. She reports that she does not drink alcohol or use drugs.    Review of Systems  Gastrointestinal (upper)  : Negative for upper GI symptoms  Gastrointestinal (lower) : Negative for lower GI symptoms  Constitutional : Negative for symptoms  Skin: Negative for skin symptoms  Eyes: Negative for eye symptoms  Ear/Nose/Throat : Negative for Ear/Nose/Throat symptoms  Hematologic/Lymphatic: Negative for Hematologic/Lymphatic symptoms  Cardiovascular : Negative for cardiovascular symptoms  Respiratory : Negative for respiratory symptoms  Endocrine: Negative for endocrine symptoms  Musculoskeletal: Negative for musculoskeletal symptoms  Neurological: Negative for neurological symptoms  Psychologic: Negative for psychiatric symptoms  Physical Exam: LMP 07/01/2013 Comment: tubal  Constitutional:  Alert and oriented, No acute distress. HEENT: [place] AT, moist mucus membranes.  Trachea midline, no masses. Cardiovascular: No clubbing, cyanosis, or edema. Respiratory: Normal respiratory effort, no increased work of breathing. GI: Abdomen is soft, nontender, nondistended, no abdominal masses Morbid obesity limits sensitivity of exam.  GU: No CVA tenderness. No STP TTP.  Skin: No rashes, bruises or suspicious lesions. Lymph: No cervical or inguinal adenopathy. Neurologic: Grossly intact, no focal deficits, moving all 4 extremities. Psychiatric: Normal mood and affect.  Laboratory Data: Lab Results  Component Value Date   WBC 5.1 06/10/2016   HGB 13.7 06/10/2016   HCT 39.3 06/10/2016   MCV 89.0 06/10/2016   PLT 213 06/10/2016    Lab Results  Component Value Date   CREATININE 0.95 06/10/2016    No results found for: PSA  No results found for: TESTOSTERONE  Lab Results  Component Value Date   HGBA1C 5.3 06/11/2016    Urinalysis    Component Value Date/Time   COLORURINE STRAW (A) 06/10/2016 1335   APPEARANCEUR CLEAR (A) 06/10/2016 1335    APPEARANCEUR Hazy (A) 04/08/2016 0951   LABSPEC 1.017 06/10/2016 1335   LABSPEC 1.005 06/07/2014 1730   PHURINE 6.0 06/10/2016 1335   GLUCOSEU NEGATIVE 06/10/2016 1335  GLUCOSEU NEGATIVE 06/07/2014 1730   HGBUR NEGATIVE 06/10/2016 1335   BILIRUBINUR NEGATIVE 06/10/2016 1335   BILIRUBINUR Negative 04/08/2016 [place] 06/07/2014 1730   KETONESUR NEGATIVE 06/10/2016 1335   PROTEINUR NEGATIVE 06/10/2016 1335   NITRITE NEGATIVE 06/10/2016 1335   LEUKOCYTESUR NEGATIVE 06/10/2016 1335   LEUKOCYTESUR Negative 04/08/2016 0951   LEUKOCYTESUR NEGATIVE 06/07/2014 1730    Pertinent Imaging: independantly reviewed hematuria CT, as per HPI.  Assessment & Plan:    1. Microhematuria - reinforced need for furhter eval for future gross / visible episodes only.    2. Urethral caruncle - continue estrace PRN.   3. Nephrolithiasis - current stone burden small and non-obstructing, observe.   4. Dysuria  - likely GU manifestation of underlying chronic pain syndrome. Eval unremarkable for organic etiology. Do NOT rec narcotics or benzos for this indicaiton. Again suggest pelvic PT if bother progresive.  RTC 1 yeat with KUB, RUS for monitoring of her stones.     Alexis Frock, [facility] [address], [place], [place] [address] [phone]

## 2016-07-23 ENCOUNTER — Other Ambulatory Visit: Payer: Self-pay | Admitting: Family Medicine

## 2016-07-25 ENCOUNTER — Other Ambulatory Visit: Payer: Self-pay | Admitting: Family Medicine

## 2016-07-25 MED ORDER — TRAMADOL HCL 50 MG PO TABS: 50.0000 mg | ORAL_TABLET | Freq: Two times a day (BID) | ORAL | 0 refills | Status: DC | PRN

## 2016-07-26 ENCOUNTER — Telehealth: Payer: Self-pay | Admitting: *Deleted

## 2016-07-26 NOTE — Telephone Encounter (Signed)
Left message for pt to call for [place] to get a ROI signed for record release.

## 2016-08-02 ENCOUNTER — Encounter: Payer: Self-pay | Admitting: Unknown Physician Specialty

## 2016-08-02 ENCOUNTER — Other Ambulatory Visit: Payer: Self-pay

## 2016-08-02 ENCOUNTER — Ambulatory Visit (INDEPENDENT_AMBULATORY_CARE_PROVIDER_SITE_OTHER): Payer: BC Managed Care – PPO | Admitting: Unknown Physician Specialty

## 2016-08-02 VITALS — BP 152/95 | HR 77 | Temp 97.9°F | Wt 247.0 lb

## 2016-08-02 DIAGNOSIS — N200 Calculus of kidney: Secondary | ICD-10-CM

## 2016-08-02 DIAGNOSIS — R1011 Right upper quadrant pain: Secondary | ICD-10-CM

## 2016-08-02 DIAGNOSIS — R309 Painful micturition, unspecified: Secondary | ICD-10-CM | POA: Diagnosis not present

## 2016-08-02 DIAGNOSIS — R319 Hematuria, unspecified: Secondary | ICD-10-CM

## 2016-08-02 LAB — CBC WITH DIFFERENTIAL/PLATELET
Hematocrit: 39.7 % (ref 34.0–46.6)
Hemoglobin: 13.1 g/dL (ref 11.1–15.9)
LYMPHS ABS: 1.5 10*3/uL (ref 0.7–3.1)
LYMPHS: 34 %
MCH: 30 pg (ref 26.6–33.0)
MCHC: 33 g/dL (ref 31.5–35.7)
MCV: 91 fL (ref 79–97)
MID (ABSOLUTE): 0.4 10*3/uL (ref 0.1–1.6)
MID: 10 %
NEUTROS ABS: 2.4 10*3/uL (ref 1.4–7.0)
NEUTROS PCT: 56 %
PLATELETS: 186 10*3/uL (ref 150–379)
RBC: 4.37 x10E6/uL (ref 3.77–5.28)
RDW: 13.8 % (ref 12.3–15.4)
WBC: 4.3 10*3/uL (ref 3.4–10.8)

## 2016-08-02 MED ORDER — DIPHENHYDRAMINE HCL 25 MG PO CAPS: 50.0000 mg | ORAL_CAPSULE | Freq: Four times a day (QID) | ORAL | 12 refills | Status: DC | PRN

## 2016-08-02 MED ORDER — HYDROCODONE-ACETAMINOPHEN 5-325 MG PO TABS: 1.0000 | ORAL_TABLET | Freq: Four times a day (QID) | ORAL | 0 refills | Status: DC | PRN

## 2016-08-02 MED ORDER — VERAPAMIL HCL ER 240 MG PO TBCR: 240.0000 mg | EXTENDED_RELEASE_TABLET | Freq: Every day | ORAL | 3 refills | Status: DC

## 2016-08-02 MED ORDER — KETOROLAC TROMETHAMINE 60 MG/2ML IM SOLN
60.0000 mg | Freq: Once | INTRAMUSCULAR | Status: AC
  Administered 2016-08-02: 60 mg via INTRAMUSCULAR

## 2016-08-02 MED ORDER — BUSPIRONE HCL 5 MG PO TABS: 15.0000 mg | ORAL_TABLET | Freq: Every day | ORAL | 6 refills | Status: DC

## 2016-08-02 MED ORDER — PROMETHAZINE HCL 25 MG PO TABS: 25.0000 mg | ORAL_TABLET | Freq: Three times a day (TID) | ORAL | 0 refills | Status: DC | PRN

## 2016-08-02 MED ORDER — PROMETHAZINE HCL 25 MG PO TABS
25.0000 mg | ORAL_TABLET | Freq: Once | ORAL | Status: DC
Start: 2016-08-02 — End: 2016-08-02

## 2016-08-02 MED ORDER — TAMSULOSIN HCL 0.4 MG PO CAPS: 0.4000 mg | ORAL_CAPSULE | Freq: Every day | ORAL | 3 refills | Status: DC

## 2016-08-02 NOTE — Telephone Encounter (Signed)
Patient came in for visit and stated that she needed buspar, diphenhydramine, and verapamil refilled.

## 2016-08-02 NOTE — Progress Notes (Signed)
BP (!) 152/95 (BP Location: Left Arm, Patient Position: Sitting, Cuff Size: Large)   Pulse 77   Temp 97.9 F (36.6 C)   Wt 247 lb (112 kg)   LMP 07/01/2013 Comment: tubal  SpO2 98%   BMI 41.10 kg/m    Subjective:    Patient ID: Shirley Rogers, female    DOB: 02-25-1966, 51 y.o.   MRN: [id number]  HPI: Shirley Rogers is a 51 y.o. female  Chief Complaint  Patient presents with  . Nephrolithiasis    pt states she is having severe pain in left back, pressure, painful urination, and blood in urine. states she has had a kidney stone before and it feels the same. pt states she has tried taking tramadol but states it is not touching the pain     Nephrolithiasis Patient with a history of kidney stones presents to clinic today complaining of severe left sided back pain that waxes and wanes, suprapubic abdominal pain/pressure, dysuria, hematuria, nausea x 3 weeks. This morning, she urinated frank blood. Denies fever/chills, rectal bleeding, constipation, diarrhea, vomiting. States this feels similar to her prior nephrolithiasis episode. Taking Tramadol without relief. She has been to a urologist in 05/2016 for recurrent UTIs and had a CT Abdomen that showed evidence of kidney stones.  Relevant past medical, surgical, family and social history reviewed and updated as indicated. Interim medical history since our last visit reviewed. Allergies and medications reviewed and updated.  Review of Systems  Per HPI unless specifically indicated above     Objective:    BP (!) 152/95 (BP Location: Left Arm, Patient Position: Sitting, Cuff Size: Large)   Pulse 77   Temp 97.9 F (36.6 C)   Wt 247 lb (112 kg)   LMP 07/01/2013 Comment: tubal  SpO2 98%   BMI 41.10 kg/m   Wt Readings from Last 3 Encounters:  08/02/16 247 lb (112 kg)  07/20/16 245 lb 9.5 oz (111.4 kg)  06/10/16 263 lb 6.4 oz (119.5 kg)    Physical Exam  Constitutional: She is oriented to person, place, and time. She appears  well-developed and well-nourished. No distress.  HENT:  Head: Normocephalic and atraumatic.  Eyes: Conjunctivae are normal. Right eye exhibits no discharge. Left eye exhibits no discharge. No scleral icterus.  Neck: Normal range of motion. Neck supple. No JVD present.  Cardiovascular: Normal rate, regular rhythm, normal heart sounds and intact distal pulses.   Pulmonary/Chest: Effort normal and breath sounds normal. No respiratory distress. She has no wheezes. She has no rales.  Abdominal: Soft. Bowel sounds are normal. She exhibits no distension and no mass. There is no rebound and no guarding.  RUQ mildly tender to palpation. Negative Murphy's sign. No CVA tenderness.  Musculoskeletal: Normal range of motion.  Neurological: She is alert and oriented to person, place, and time.  Skin: Skin is warm and dry.  Psychiatric: She has a normal mood and affect. Her behavior is normal. Judgment and thought content normal.      Assessment & Plan:   Problem List Items Addressed This Visit      Unprioritized   Nephrolithiasis    Pt with hematuria and a negative WBC.  Rx for Toradol in office, hydrocodone prn, and Tamulosin daily for 1 month.  Push fluids.  She she call her Urologist      Relevant Medications   tamsulosin (FLOMAX) 0.4 MG CAPS capsule   ketorolac (TORADOL) injection 60 mg (Completed)   HYDROcodone-acetaminophen (NORCO/VICODIN) 5-325 MG tablet  Other Visit Diagnoses    Hematuria, unspecified type    -  Primary   Relevant Orders   UA/M w/rflx Culture, Routine   Painful urination       Relevant Orders   UA/M w/rflx Culture, Routine   Right upper quadrant abdominal pain       Relevant Orders   CBC With Differential/Platelet       Follow up plan: Return in about 4 weeks (around 08/30/2016).

## 2016-08-02 NOTE — Assessment & Plan Note (Signed)
Pt with hematuria and a negative WBC.  Rx for Toradol in office, hydrocodone prn, and Tamulosin daily for 1 month.  Push fluids.  She she call her Urologist

## 2016-08-05 LAB — UA/M W/RFLX CULTURE, ROUTINE
BILIRUBIN UA: NEGATIVE
Glucose, UA: NEGATIVE
KETONES UA: NEGATIVE
LEUKOCYTES UA: NEGATIVE
Nitrite, UA: NEGATIVE
PH UA: 6 (ref 5.0–7.5)
Specific Gravity, UA: >1.03 — ABNORMAL HIGH (ref 1.005–1.030)
Urobilinogen, Ur: 0.2 mg/dL (ref 0.2–1.0)

## 2016-08-05 LAB — MICROSCOPIC EXAMINATION: EPITHELIAL CELLS (NON RENAL): NONE SEEN /HPF (ref 0–10)

## 2016-08-05 LAB — URINE CULTURE, REFLEX

## 2016-08-11 ENCOUNTER — Other Ambulatory Visit: Payer: Self-pay | Admitting: Family Medicine

## 2016-08-12 NOTE — Telephone Encounter (Signed)
Routing to provider. F/u on 09/08/16.

## 2016-08-18 ENCOUNTER — Telehealth: Payer: Self-pay | Admitting: Family Medicine

## 2016-08-18 ENCOUNTER — Emergency Department
Admission: EM | Admit: 2016-08-18 | Discharge: 2016-08-18 | Disposition: A | Discharge status: Home / Self Care | Payer: BC Managed Care – PPO | Attending: Emergency Medicine | Admitting: Emergency Medicine

## 2016-08-18 ENCOUNTER — Ambulatory Visit: Payer: BC Managed Care – PPO | Admitting: Family Medicine

## 2016-08-18 ENCOUNTER — Emergency Department: Payer: BC Managed Care – PPO

## 2016-08-18 DIAGNOSIS — Z79899 Other long term (current) drug therapy: Secondary | ICD-10-CM | POA: Insufficient documentation

## 2016-08-18 DIAGNOSIS — F17210 Nicotine dependence, cigarettes, uncomplicated: Secondary | ICD-10-CM | POA: Insufficient documentation

## 2016-08-18 DIAGNOSIS — N200 Calculus of kidney: Secondary | ICD-10-CM | POA: Diagnosis not present

## 2016-08-18 DIAGNOSIS — N3001 Acute cystitis with hematuria: Secondary | ICD-10-CM | POA: Diagnosis not present

## 2016-08-18 DIAGNOSIS — I10 Essential (primary) hypertension: Secondary | ICD-10-CM | POA: Insufficient documentation

## 2016-08-18 DIAGNOSIS — R0689 Other abnormalities of breathing: Secondary | ICD-10-CM | POA: Diagnosis not present

## 2016-08-18 DIAGNOSIS — R319 Hematuria, unspecified: Secondary | ICD-10-CM | POA: Diagnosis present

## 2016-08-18 LAB — URINALYSIS, COMPLETE (UACMP) WITH MICROSCOPIC
Bilirubin Urine: NEGATIVE
GLUCOSE, UA: NEGATIVE mg/dL
Hgb urine dipstick: NEGATIVE
Ketones, ur: NEGATIVE mg/dL
NITRITE: NEGATIVE
PH: 5 (ref 5.0–8.0)
Protein, ur: NEGATIVE mg/dL
Specific Gravity, Urine: 1.008 (ref 1.005–1.030)
Squamous Epithelial / LPF: NONE SEEN

## 2016-08-18 LAB — BASIC METABOLIC PANEL
Anion gap: 8 (ref 5–15)
BUN: 14 mg/dL (ref 6–20)
CALCIUM: 9 mg/dL (ref 8.9–10.3)
CO2: 23 mmol/L (ref 22–32)
CREATININE: 0.91 mg/dL (ref 0.44–1.00)
Chloride: 107 mmol/L (ref 101–111)
GFR calc non Af Amer: >60 mL/min (ref >60)
GLUCOSE: 91 mg/dL (ref 65–99)
Potassium: 3.6 mmol/L (ref 3.5–5.1)
Sodium: 138 mmol/L (ref 135–145)

## 2016-08-18 LAB — CBC
HCT: 41.3 % (ref 35.0–47.0)
Hemoglobin: 14.1 g/dL (ref 12.0–16.0)
MCH: 30.3 pg (ref 26.0–34.0)
MCHC: 34.2 g/dL (ref 32.0–36.0)
MCV: 88.4 fL (ref 80.0–100.0)
PLATELETS: 216 10*3/uL (ref 150–440)
RBC: 4.67 MIL/uL (ref 3.80–5.20)
RDW: 14.1 % (ref 11.5–14.5)
WBC: 5.7 10*3/uL (ref 3.6–11.0)

## 2016-08-18 LAB — HEPATIC FUNCTION PANEL
ALT: 13 U/L — AB (ref 14–54)
AST: 18 U/L (ref 15–41)
Albumin: 4.5 g/dL (ref 3.5–5.0)
Alkaline Phosphatase: 44 U/L (ref 38–126)
BILIRUBIN DIRECT: 0.1 mg/dL (ref 0.1–0.5)
Indirect Bilirubin: 0.4 mg/dL (ref 0.3–0.9)
Total Bilirubin: 0.5 mg/dL (ref 0.3–1.2)
Total Protein: 7.4 g/dL (ref 6.5–8.1)

## 2016-08-18 LAB — TROPONIN I: Troponin I: <0.03 ng/mL (ref <0.03)

## 2016-08-18 LAB — BRAIN NATRIURETIC PEPTIDE: B NATRIURETIC PEPTIDE 5: 45 pg/mL (ref 0.0–100.0)

## 2016-08-18 LAB — LIPASE, BLOOD: Lipase: 16 U/L (ref 11–51)

## 2016-08-18 LAB — FIBRIN DERIVATIVES D-DIMER (ARMC ONLY): FIBRIN DERIVATIVES D-DIMER (ARMC): 333.98 (ref 0.00–499.00)

## 2016-08-18 LAB — TSH: TSH: 0.86 u[IU]/mL (ref 0.350–4.500)

## 2016-08-18 MED ORDER — CIPROFLOXACIN IN D5W 400 MG/200ML IV SOLN
400.0000 mg | Freq: Once | INTRAVENOUS | Status: AC
  Administered 2016-08-18: 400 mg via INTRAVENOUS
  Filled 2016-08-18: qty 200

## 2016-08-18 MED ORDER — CIPROFLOXACIN HCL 500 MG PO TABS: 500.0000 mg | ORAL_TABLET | Freq: Two times a day (BID) | ORAL | 0 refills | Status: AC

## 2016-08-18 MED ORDER — TAMSULOSIN HCL 0.4 MG PO CAPS: 0.4000 mg | ORAL_CAPSULE | Freq: Every day | ORAL | 0 refills | Status: DC

## 2016-08-18 MED ORDER — TAMSULOSIN HCL 0.4 MG PO CAPS
0.4000 mg | ORAL_CAPSULE | Freq: Once | ORAL | Status: AC
  Administered 2016-08-18: 0.4 mg via ORAL
  Filled 2016-08-18: qty 1

## 2016-08-18 MED ORDER — SODIUM CHLORIDE 0.9 % IV BOLUS (SEPSIS)
1000.0000 mL | Freq: Once | INTRAVENOUS | Status: AC
  Administered 2016-08-18: 1000 mL via INTRAVENOUS

## 2016-08-18 MED ORDER — PHENAZOPYRIDINE HCL 200 MG PO TABS
200.0000 mg | ORAL_TABLET | Freq: Once | ORAL | Status: AC
  Administered 2016-08-18: 200 mg via ORAL
  Filled 2016-08-18: qty 1

## 2016-08-18 MED ORDER — HALOPERIDOL LACTATE 5 MG/ML IJ SOLN
2.5000 mg | Freq: Once | INTRAMUSCULAR | Status: AC
  Administered 2016-08-18: 2.5 mg via INTRAVENOUS
  Filled 2016-08-18: qty 1

## 2016-08-18 NOTE — ED Provider Notes (Signed)
[facility] Emergency Department Provider Note  ____________________________________________   First MD Initiated Contact with Patient 08/18/16 1512     (approximate)  I have reviewed the triage vital signs and the nursing notes.   HISTORY  Chief Complaint Loss of Consciousness; Emesis; and Diarrhea    HPI Shirley Rogers is a 51 y.o. female who comes to the emergency department with a variety of issues. 2 weeks ago she was diagnosed with a kidney stone and she passed the stone 2 days ago. Ever since passing it she has had persistent dysuria hematuria and flank pain. She also has noted that her stomach is a little bit more distended than normal and her appetite is decreased. She has had several loose stools a day. This morning we'll she was getting ready to go to work she bent over to tie her shoes in the next thing she knew she woke up on the floor with her dog licking her face. She does have a past medical history of pulmonary embolism diagnosed 5 months ago for which she takes Eliquis. Today she called to get an appointment to see her primary care physician but he was out of the office and the patient did not want to see a nurse practitioner so she came to the emergency department instead.She also reports a painful ulcer to the inner aspect of her left labia.   Past Medical History:  Diagnosis Date  . Anxiety    on xanax in the past  . ASCVD (arteriosclerotic cardiovascular disease)   . Barrett esophagus   . 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 Carotid atherosclerosis 2003   on ultrasound  . Collagen vascular disease ([place])   . Colon polyp   . Complicated migraine    with facial drooping  . Concussion    1974,1982,1987,2016,2016  . DDD (degenerative disc disease), lumbar    MRI 2008, L3-4, L5-S1, spondylotic changes, multilevel facet joint hypertrophic changes  . Depression    on paxil, lexapro, cymbalta in the past  . Diverticulosis   . Dizziness due to old  head injury   . Family history of adverse reaction to anesthesia    father - slow to wake  . Fatty liver 2013   On CT abdomen/pelvis  . Fibrocystic breast disease   . GERD (gastroesophageal reflux disease)   . Hemiplegic migraine    daily  . Hemorrhoids   . Hyperlipidemia   . Hypertension    no meds currently.  . IBS (irritable bowel syndrome)   . Insomnia chronic  . Kidney stone 2013   on CT abdomen/pelvis  . 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 Migraine   . Narrowing of intervertebral disc space 2011   C5-6  . Normal cardiac stress test 2009   EF 66%  . Pulmonary emboli ([place])   . Recurrent sinusitis   . Recurrent UTI    question about IC in the past  . Sleep apnea    CPAP machine broken  . Stroke ([place])    2000 - no deficits  . Umbilical hernia 5462   on CT abdomen/pelvis  . Varicose veins    right lower leg  . Vascular spasm ([place])   . Vertigo    from concussive disorder.  seeing neuro 03/23/15    Patient Active Problem List   Diagnosis Date Noted  . Nephrolithiasis 07/22/2016  . Dysuria 07/22/2016  . Right  sided weakness 06/10/2016  . 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 Behcet's disease ([place]) 04/24/2016  . Microhematuria 03/15/2016  . Pelvic floor dysfunction 03/15/2016  . Urethral caruncle 03/15/2016  . Pulmonary embolus ([place]) 03/11/2016  . Facial droop 02/04/2016  . Right hemiparesis ([place]) 02/04/2016  . Tobacco use 12/03/2015  . ANA positive 10/19/2015  . Transient alteration of awareness 09/24/2015  . Snoring 06/10/2015  . Sleep apnea 06/10/2015  . 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 Globus sensation 03/30/2015  . Post concussion syndrome 03/13/2015  . Depression 03/13/2015  . Benign hypertensive renal disease   . Barrett esophagus   . Diverticulosis   . Hemiplegic migraine   . Lupus   . Stroke ([place])   . Fall  05/25/2014    Past Surgical History:  Procedure Laterality Date  . Biopsy Punch Thyroid    . BREAST EXCISIONAL BIOPSY Right 2000   right mass excision - atypical hyperplasia  . BREAST SURGERY Right    atypical hyperplasia  . CHOLECYSTECTOMY N/A 07/24/2015   Procedure: LAPAROSCOPIC CHOLECYSTECTOMY WITH INTRAOPERATIVE CHOLANGIOGRAM;  Surgeon: Leonie Green, MD;  Location: [facility] ORS;  Service: General;  Laterality: N/A;  . COLONOSCOPY W/ BIOPSIES     Removed 5 polyps  . TUBAL LIGATION      Prior to Admission medications   Medication Sig Start Date End Date Taking? Authorizing Provider  apixaban (ELIQUIS) 5 MG TABS tablet Take 1 tablet (5 mg total) by mouth 2 (two) times daily. 03/18/16   Megan P Johnson, DO  azaTHIOprine (IMURAN) 50 MG tablet Take 50 mg by mouth 2 (two) times daily. 04/25/16   Historical Provider, MD  b complex vitamins tablet Take 1 tablet by mouth daily.    Historical Provider, MD  busPIRone (BUSPAR) 5 MG tablet Take 3 tablets (15 mg total) by mouth daily. 08/02/16   Megan P Johnson, DO  ciprofloxacin (CIPRO) 500 MG tablet Take 1 tablet (500 mg total) by mouth 2 (two) times daily. 08/18/16 08/25/16  Darel Hong, MD  citalopram (CELEXA) 20 MG tablet TAKE 1 AND 1/2 TABLETS(30 MG) BY MOUTH DAILY 06/17/16   Megan P Johnson, DO  citalopram (CELEXA) 20 MG tablet TAKE 1 AND 1/2 TABLETS(30 MG) BY MOUTH DAILY 08/12/16   Megan P Johnson, DO  citalopram (CELEXA) 40 MG tablet TAKE 1 AND 1/2 TABLETS(60 MG) BY MOUTH DAILY 06/06/16   Megan P Johnson, DO  diphenhydrAMINE (BENADRYL) 25 mg capsule Take 2 capsules (50 mg total) by mouth every 6 (six) hours as needed. 08/02/16   Megan Annia Friendly, DO  Elastic Bandages & Supports (T.E.D. THIGH LENGTH/L-REGULAR) MISC 2 application by Does not apply route every morning. 03/18/16   Merlyn Lot, MD  EPINEPHrine (EPIPEN 2-PAK) 0.3 mg/0.3 mL IJ SOAJ injection Inject 0.3 mLs (0.3 mg total) into the muscle once. 09/24/15   Megan P Johnson, DO    furosemide (LASIX) 20 MG tablet TAKE 1 TABLET(20 MG) BY MOUTH DAILY 08/12/16   Megan P Johnson, DO  gabapentin (NEURONTIN) 300 MG capsule Take 300 mg by mouth 3 (three) times daily.  03/02/16 08/29/16  Historical Provider, MD  hydrochlorothiazide (HYDRODIURIL) 25 MG tablet Take 25 mg by mouth every other day.     Historical Provider, MD  HYDROcodone-acetaminophen (NORCO/VICODIN) 5-325 MG tablet Take 1 tablet by mouth every 6 (six) hours as needed for moderate pain. 08/02/16   Kathrine Haddock, NP  Magnesium Oxide 140 MG CAPS Take 280  mg by mouth daily. Reported on 05/14/2015    Historical Provider, MD  omeprazole (PRILOSEC) 40 MG capsule Take 40 mg by mouth daily.    Historical Provider, MD  PROAIR HFA 108 (90 Base) MCG/ACT inhaler INHALE 2 PUFFS BY MOUTH EVERY 4 HOURS AS NEEDED FOR WHEEZING 07/25/16   Guadalupe Maple, MD  promethazine (PHENERGAN) 25 MG tablet Take 1 tablet (25 mg total) by mouth every 8 (eight) hours as needed for nausea or vomiting. 08/02/16   Kathrine Haddock, NP  tamsulosin (FLOMAX) 0.4 MG CAPS capsule Take 1 capsule (0.4 mg total) by mouth daily. 08/18/16   Darel Hong, MD  topiramate (TOPAMAX) 200 MG tablet Take 200 mg by mouth daily. 05/09/16   Historical Provider, MD  traMADol (ULTRAM) 50 MG tablet Take 1 tablet (50 mg total) by mouth 2 (two) times daily as needed. 07/25/16   Megan P Johnson, DO  traZODone (DESYREL) 100 MG tablet TAKE 1 TABLET(100 MG) BY MOUTH AT BEDTIME AS NEEDED FOR SLEEP 06/23/16   Megan P Johnson, DO  verapamil (CALAN-SR) 240 MG CR tablet Take 1 tablet (240 mg total) by mouth daily. 08/02/16   Megan P Johnson, DO    Allergies Rocephin [ceftriaxone]; Statins; Compazine [prochlorperazine]; Doxycycline; Erythromycin; Sulfa antibiotics; Meloxicam; Sulfamethoxazole-trimethoprim; Ace inhibitors; Biaxin [clarithromycin]; Latex; and Morphine and related  Family History  Problem Relation Age of Onset  . Other Mother     IBS  . Hypertension Mother   . Heart disease Father    . Diverticulosis Father   . Migraines Father   . Other Son     Ulcerative Colitis, Polonidal cyst  . Parkinson's disease Maternal Grandmother   . Cancer Maternal Grandmother   . Mental illness Paternal Grandmother   . Stroke Paternal Grandmother   . Cancer Paternal Grandmother     brain cancer  . Bipolar disorder Paternal Grandmother   . Stroke Maternal Grandfather   . Cancer Maternal Grandfather     Bladder, Prostate/bladder/prostate  . Heart disease Paternal Grandfather   . Heart attack Paternal Grandfather   . Macular degeneration    . Diabetes Brother   . Breast cancer Maternal Aunt     Social History Social History  Substance Use Topics  . Smoking status: Current Some Day Smoker    Packs/day: 0.25    Years: 1.00    Types: Cigarettes    Last attempt to quit: 06/12/2015  . Smokeless tobacco: Never Used     Comment: 1-2 cigarettes   . Alcohol use No     Comment: social    Review of Systems Constitutional: No fever/chills Eyes: No visual changes. ENT: No sore throat. Cardiovascular: Positive chest pain. Respiratory: Denies shortness of breath. Gastrointestinal: Positive abdominal pain.  Positive nausea, no vomiting.  Positive diarrhea.  No constipation. Genitourinary: Positive for dysuria. Musculoskeletal: Negative for back pain. Skin: Negative for rash. Neurological: Negative for headaches, focal weakness or numbness.  10-point ROS otherwise negative.  ____________________________________________   PHYSICAL EXAM:  VITAL SIGNS: ED Triage Vitals  Enc Vitals Group     BP 08/18/16 1433 (!) 147/93     Pulse Rate 08/18/16 1433 68     Resp 08/18/16 1433 20     Temp 08/18/16 1433 98.2 F (36.8 C)     Temp Source 08/18/16 1433 Oral     SpO2 08/18/16 1433 99 %     Weight 08/18/16 1434 233 lb (105.7 kg)     Height 08/18/16 1434 5\' 5"  (1.651 m)  Head Circumference --      Peak Flow --      Pain Score 08/18/16 1439 8     Pain Loc --      Pain Edu? --       Excl. in [place]? --     Constitutional: Alert and oriented x 4 well appearing nontoxic no diaphoresis speaks in full, clear sentences Eyes: PERRL EOMI. Head: Atraumatic. Nose: No congestion/rhinnorhea. Mouth/Throat: No trismus Neck: No stridor.   Cardiovascular: Normal rate, regular rhythm. Grossly normal heart sounds.  Good peripheral circulation. Respiratory: Normal respiratory effort.  No retractions. Lungs CTAB and moving good air Gastrointestinal: Soft nondistended nontender no rebound no guarding no peritonitis no McBurney's tenderness negative Rovsing's no costovertebral tenderness negative Murphy's Genitourinary: External vaginal exam performed with female chaperone nurse Colletta [place]: No evidence of Bartholin's cyst or abscess on the inner aspect of her left labia majora is a small noninfectious appearing ulcer with history is consistent with Bechet's disease Musculoskeletal: No lower extremity edema   Neurologic:  Normal speech and language. No gross focal neurologic deficits are appreciated. Skin:  Skin is warm, dry and intact. No rash noted. Psychiatric: Sad and depressed affect    ____________________________________________   DIFFERENTIAL  Dehydration, vasovagal syncope, cardiogenic syncope, pulmonary embolism, Bartholin's abscess, urinary tract infection ____________________________________________   LABS (all labs ordered are listed, but only abnormal results are displayed)  Labs Reviewed  URINE CULTURE - Abnormal; Notable for the following:       Result Value   Culture MULTIPLE SPECIES PRESENT, SUGGEST RECOLLECTION (*)    All other components within normal limits  URINALYSIS, COMPLETE (UACMP) WITH MICROSCOPIC - Abnormal; Notable for the following:    Color, Urine YELLOW (*)    APPearance CLOUDY (*)    Leukocytes, UA LARGE (*)    Bacteria, UA FEW (*)    All other components within normal limits  HEPATIC FUNCTION PANEL - Abnormal; Notable for the following:    ALT  13 (*)    All other components within normal limits  BASIC METABOLIC PANEL  CBC  LIPASE, BLOOD  TROPONIN I  TSH  BRAIN NATRIURETIC PEPTIDE  FIBRIN DERIVATIVES D-DIMER ([facility] ONLY)    Urinalysis consistent with infection __________________________________________  EKG  ED ECG REPORT I, Darel Hong, the attending physician, personally viewed and interpreted this ECG.  Date: 08/18/2016 Rate: 70 Rhythm: normal sinus rhythm QRS Axis: normal Intervals: normal ST/T Wave abnormalities: normal Conduction Disturbances: none Narrative Interpretation: unremarkable. No Brugada, normal intervals  ____________________________________________  RADIOLOGY  CT scan showing nonobstructive left-sided kidney stone ____________________________________________   PROCEDURES  Procedure(s) performed: no  Procedures  Critical Care performed: no  ____________________________________________   INITIAL IMPRESSION / ASSESSMENT AND PLAN / ED COURSE  Pertinent labs & imaging results that were available during my care of the patient were reviewed by me and considered in my medical decision making (see chart for details).  The patient arrives tired but well-appearing with normal vital signs and an unremarkable EKG. She has a number of complaints but what is most concerning to her is she is worried about her liver and her stomach. Differentials extremely broad subtle check broad labs as well as Pyridium for her dysuria and haloperidol for her fatigue.  ----------------------------------------- 5:36 PM on 08/18/2016 -----------------------------------------  I discussed the case with Dr. Louis Meckel on call for Urology who recommended a urine culture, antibiotics, flomax, and strict return precautions.  Also recommends she make an appointment to see Urology within the next few days.  ____________________________________________ After given IV ciprofloxacin the patient remains hemodynamically  stable and feels improved. She is stable for outpatient management understands and agrees the plan.  FINAL CLINICAL IMPRESSION(S) / ED DIAGNOSES  Final diagnoses:  Acute cystitis with hematuria  Kidney stone      NEW MEDICATIONS STARTED DURING THIS VISIT:  Discharge Medication List as of 08/18/2016  7:46 PM    START taking these medications   Details  ciprofloxacin (CIPRO) 500 MG tablet Take 1 tablet (500 mg total) by mouth 2 (two) times daily., Starting Thu 08/18/2016, Until Thu 08/25/2016, Print         Note:  This document was prepared using Dragon voice recognition software and may include unintentional dictation errors.     Darel Hong, MD 08/19/16 [id number]

## 2016-08-18 NOTE — Telephone Encounter (Signed)
I recommendchris that she goes to the ER

## 2016-08-18 NOTE — Telephone Encounter (Signed)
Pt called and stated she passed a kidney stone last week. She stated that her vagina was burning with a painful knot on it. She stated that she had pain under her rib cage (R side) and back pain (R side). At times she has trouble breathing and she has been weak. She stated that she had lost 10 pounds since Sunday due to diarrhea and the smell of food making her nauseous.

## 2016-08-18 NOTE — ED Notes (Addendum)
cipro complete. Was not "scanned properly" according to error message so signed off on discharge.  Pt dischrged.

## 2016-08-18 NOTE — ED Triage Notes (Signed)
Pt c/o N/V/D for the past 2 weeks , states she passed out while leaning over the bed this morning, pt also c/o abscess at the perineal area, pt states she was seen for kidney stone last week as well and is still passing blood in her urine.

## 2016-08-18 NOTE — Discharge Instructions (Signed)
The most important thing when you have a kidney stone is at if you develop a fever at any point this could be life-threatening. Do not delay and call 911 and come to the emergency department if you develop a fever or chills.  Please take all of your antibiotics as prescribed and call the urologist first thing in the morning to make an appointment.  It was a pleasure to take care of you today, and thank you for coming to our emergency department.  If you have any questions or concerns before leaving please ask the nurse to grab me and I'm more than happy to go through your aftercare instructions again.  If you were prescribed any opioid pain medication today such as Norco, Vicodin, Percocet, morphine, hydrocodone, or oxycodone please make sure you do not drive when you are taking this medication as it can alter your ability to drive safely.  If you have any concerns once you are home that you are not improving or are in fact getting worse before you can make it to your follow-up appointment, please do not hesitate to call 911 and come back for further evaluation.  Darel Hong MD  Results for orders placed or performed during the hospital encounter of 16/60/63  Basic metabolic panel  Result Value Ref Range   Sodium 138 135 - 145 mmol/L   Potassium 3.6 3.5 - 5.1 mmol/L   Chloride 107 101 - 111 mmol/L   CO2 23 22 - 32 mmol/L   Glucose, Bld 91 65 - 99 mg/dL   BUN 14 6 - 20 mg/dL   Creatinine, Ser 0.91 0.44 - 1.00 mg/dL   Calcium 9.0 8.9 - 10.3 mg/dL   GFR calc non Af Amer >60 >60 mL/min   GFR calc Af Amer >60 >60 mL/min   Anion gap 8 5 - 15  CBC  Result Value Ref Range   WBC 5.7 3.6 - 11.0 K/uL   RBC 4.67 3.80 - 5.20 MIL/uL   Hemoglobin 14.1 12.0 - 16.0 g/dL   HCT 41.3 35.0 - 47.0 %   MCV 88.4 80.0 - 100.0 fL   MCH 30.3 26.0 - 34.0 pg   MCHC 34.2 32.0 - 36.0 g/dL   RDW 14.1 11.5 - 14.5 %   Platelets 216 150 - 440 K/uL  Urinalysis, Complete w Microscopic  Result Value Ref Range   Color, Urine YELLOW (A) YELLOW   APPearance CLOUDY (A) CLEAR   Specific Gravity, Urine 1.008 1.005 - 1.030   pH 5.0 5.0 - 8.0   Glucose, UA NEGATIVE NEGATIVE mg/dL   Hgb urine dipstick NEGATIVE NEGATIVE   Bilirubin Urine NEGATIVE NEGATIVE   Ketones, ur NEGATIVE NEGATIVE mg/dL   Protein, ur NEGATIVE NEGATIVE mg/dL   Nitrite NEGATIVE NEGATIVE   Leukocytes, UA LARGE (A) NEGATIVE   RBC / HPF TOO NUMEROUS TO COUNT 0 - 5 RBC/hpf   WBC, UA 6-30 0 - 5 WBC/hpf   Bacteria, UA FEW (A) NONE SEEN   Squamous Epithelial / LPF NONE SEEN NONE SEEN   Mucous PRESENT   Hepatic function panel  Result Value Ref Range   Total Protein 7.4 6.5 - 8.1 g/dL   Albumin 4.5 3.5 - 5.0 g/dL   AST 18 15 - 41 U/L   ALT 13 (L) 14 - 54 U/L   Alkaline Phosphatase 44 38 - 126 U/L   Total Bilirubin 0.5 0.3 - 1.2 mg/dL   Bilirubin, Direct 0.1 0.1 - 0.5 mg/dL   Indirect Bilirubin  0.4 0.3 - 0.9 mg/dL  Lipase, blood  Result Value Ref Range   Lipase 16 11 - 51 U/L  Troponin I  Result Value Ref Range   Troponin I <0.03 <0.03 ng/mL  TSH  Result Value Ref Range   TSH 0.860 0.350 - 4.500 uIU/mL  Brain natriuretic peptide  Result Value Ref Range   B Natriuretic Peptide 45.0 0.0 - 100.0 pg/mL  Fibrin derivatives D-Dimer ([facility] only)  Result Value Ref Range   Fibrin derivatives D-dimer ([facility]) 333.98 0.00 - 499.00   Dg Chest 1 View  Result Date: 08/18/2016 CLINICAL DATA:  Chest pain. EXAM: CHEST 1 VIEW COMPARISON:  None. FINDINGS: The heart size and mediastinal contours are within normal limits. Both lungs are clear. The visualized skeletal structures are unremarkable. IMPRESSION: No active disease.  Stable exam. Electronically Signed   By: Staci Righter M.D.   On: 08/18/2016 15:44   Ct Renal Stone Study  Result Date: 08/18/2016 CLINICAL DATA:  Nausea, vomiting, and diarrhea for 2 weeks, syncope this morning, perineal abscess, recent kidney stone, still passing blood in urine EXAM: CT ABDOMEN AND PELVIS WITHOUT CONTRAST  TECHNIQUE: Multidetector CT imaging of the abdomen and pelvis was performed following the standard protocol without IV contrast. Sagittal and coronal MPR images reconstructed from axial data set. No oral contrast administered. COMPARISON:  03/24/2016 FINDINGS: Lower chest: Lung bases clear Hepatobiliary: Gallbladder surgically absent. Liver normal appearance Pancreas: Normal appearance Spleen: Normal appearance Adrenals/Urinary Tract: Adrenal glands normal appearance. BILATERAL nonobstructing renal calculi largest at inferior pole LEFT kidney measuring 6 mm. No hydronephrosis or hydroureter. A 5 x 3 mm diameter calculus however is identified at the LEFT ureterovesical junction. Bladder otherwise unremarkable. Stomach/Bowel: Normal appendix. Small hiatal hernia. Stomach and bowel loops otherwise normal appearance. Vascular/Lymphatic: Atherosclerotic calcification aorta without aneurysm. No adenopathy. Reproductive: Unremarkable uterus and adnexa Other: No free air free fluid. Small umbilical hernia containing fat. Musculoskeletal: Degenerative disc and facet disease changes lumbar spine. IMPRESSION: 5 x 3 mm diameter calculus at LEFT ureterovesical junction without significant hydronephrosis or hydroureter. Additional BILATERAL nonobstructing renal calculi. Small hiatal hernia. Aortic atherosclerosis. Electronically Signed   By: Lavonia Dana M.D.   On: 08/18/2016 17:20

## 2016-08-18 NOTE — Telephone Encounter (Signed)
Pt.notified

## 2016-08-19 LAB — URINE CULTURE

## 2016-08-24 ENCOUNTER — Other Ambulatory Visit: Payer: Self-pay | Admitting: Family Medicine

## 2016-08-25 ENCOUNTER — Ambulatory Visit: Payer: Self-pay | Admitting: Neurology

## 2016-08-25 ENCOUNTER — Telehealth: Payer: Self-pay | Admitting: *Deleted

## 2016-08-25 NOTE — Telephone Encounter (Signed)
No opinion letter at this time for [employer].

## 2016-08-26 ENCOUNTER — Encounter: Payer: Self-pay | Admitting: Neurology

## 2016-08-28 DIAGNOSIS — M1990 Unspecified osteoarthritis, unspecified site: Secondary | ICD-10-CM | POA: Insufficient documentation

## 2016-09-02 ENCOUNTER — Ambulatory Visit: Payer: BC Managed Care – PPO | Admitting: Family Medicine

## 2016-09-07 ENCOUNTER — Telehealth: Payer: Self-pay | Admitting: Family Medicine

## 2016-09-07 NOTE — Telephone Encounter (Signed)
Pt would like a refill for traMADol (ULTRAM) 50 MG tablet.

## 2016-09-08 ENCOUNTER — Ambulatory Visit: Payer: BC Managed Care – PPO | Admitting: Family Medicine

## 2016-09-08 MED ORDER — TRAMADOL HCL 50 MG PO TABS: 50.0000 mg | ORAL_TABLET | Freq: Two times a day (BID) | ORAL | 0 refills | Status: DC | PRN

## 2016-09-08 NOTE — Telephone Encounter (Signed)
Rx up front

## 2016-09-09 ENCOUNTER — Other Ambulatory Visit: Payer: Self-pay | Admitting: Family Medicine

## 2016-09-15 ENCOUNTER — Ambulatory Visit (INDEPENDENT_AMBULATORY_CARE_PROVIDER_SITE_OTHER): Payer: BC Managed Care – PPO | Admitting: Family Medicine

## 2016-09-15 ENCOUNTER — Encounter: Payer: Self-pay | Admitting: Family Medicine

## 2016-09-15 ENCOUNTER — Other Ambulatory Visit: Payer: Self-pay | Admitting: Family Medicine

## 2016-09-15 VITALS — BP 134/85 | HR 73 | Temp 98.6°F | Wt 244.0 lb

## 2016-09-15 DIAGNOSIS — N200 Calculus of kidney: Secondary | ICD-10-CM | POA: Diagnosis not present

## 2016-09-15 DIAGNOSIS — M352 Behcet's disease: Secondary | ICD-10-CM | POA: Diagnosis not present

## 2016-09-15 DIAGNOSIS — F331 Major depressive disorder, recurrent, moderate: Secondary | ICD-10-CM | POA: Diagnosis not present

## 2016-09-15 MED ORDER — DULOXETINE HCL 60 MG PO CPEP: 60.0000 mg | ORAL_CAPSULE | Freq: Every day | ORAL | 3 refills | Status: DC

## 2016-09-15 MED ORDER — BUSPIRONE HCL 15 MG PO TABS: 15.0000 mg | ORAL_TABLET | Freq: Three times a day (TID) | ORAL | 3 refills | Status: DC | PRN

## 2016-09-15 NOTE — Assessment & Plan Note (Signed)
Continue to follow with rheumatology. Call with any concerns.  

## 2016-09-15 NOTE — Assessment & Plan Note (Signed)
Resolved. Continue to follow with urology. Call with any concerns.

## 2016-09-15 NOTE — Assessment & Plan Note (Signed)
No better on celexa. Will change to cymbalta to help with pain from fibro. OK to increase buspar to TID PRN, follow up 3 weeks, if not significantly better, will add wellbutrin.

## 2016-09-15 NOTE — Progress Notes (Signed)
BP 134/85 (BP Location: Left Arm, Patient Position: Sitting, Cuff Size: Large)   Pulse 73   Temp 98.6 F (37 C)   Wt 244 lb (110.7 kg)   LMP 07/01/2013 Comment: tubal  SpO2 97%   BMI 40.60 kg/m    Subjective:    Patient ID: Shirley Rogers, female    DOB: 1966/03/23, 51 y.o.   MRN: [id number]  HPI: Shirley Rogers is a 51 y.o. female  Chief Complaint  Patient presents with  . Follow-up   Shirley Rogers has been to the ER 2x at [facility] since her last visit- 1  1/12-13 for a facial droop related to her migraines where she was kept over at the hospital to R/O vertebrobasilar thrombus. She had a negative CT of her head and Negative MRI- she continued with her eliquis and was to have out patient follow up with her neurologist. They also set up home PT and OT  3/22- Back to [facility] for LOC, emesis and diarrhea, had a kidney stone 2 weeks prior to that and had persistent hematuria and dysuria. She also had quite a bit of diarrhea. She passed out when she bent over to tie her shoes and woke up on the floor. Her EKG was negative. She was diagnosed with a UTI and kidney stone and started on cipro. She was advised to follow up with urology.   Saw her rheumatologist 08/24/16- She had remicade added and continued her imuran. They thought she had vertigo and got her into vestibular rehab. They reviewed visits to neurology that were concerned about increased intracranial pressure, dentist visit with 5 teeth pulled due to oral ulcers and abscesses and antibiotics due to a jaw infection. She had not been doing well on the azathioprine, so remicade was added and they were going to consider taking her off the azathiaprine. She had a EMG ordered and a RUQ [place] ordered. She's due to follow up with them in June.   She had her EMG done on 09/12/16- it was normal.  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 They are considering starting her on humeria instead. Awaiting  her call back.   DEPRESSION Mood status: uncontrolled Satisfied with current treatment?: no Symptom severity: moderate  Duration of current treatment : chronic Side effects: no Medication compliance: excellent compliance Psychotherapy/counseling: no  Previous psychiatric medications: celexa Depressed mood: yes Anxious mood: yes Anhedonia: yes Significant weight loss or gain: yes Insomnia: yes  Fatigue: yes Feelings of worthlessness or guilt: yes Impaired concentration/indecisiveness: yes Suicidal ideations: no Hopelessness: yes Crying spells: yes Depression screen [facility] 2/9 05/31/2016 05/08/2015 04/10/2015 03/30/2015  Decreased Interest 1 1 0 1  Down, Depressed, Hopeless 0 0 1 2  PHQ - 2 Score 1 1 1 3   Altered sleeping 0 - 3 3  Tired, decreased energy 1 - 1 2  Change in appetite 1 - 0 2  Feeling bad or failure about yourself  0 - 1 1  Trouble concentrating 0 - 2 2  Moving slowly or fidgety/restless 0 - 1 1  Suicidal thoughts 0 - 0 0  PHQ-9 Score 3 - 9 14  Difficult doing work/chores - - Somewhat difficult Very difficult    Relevant past medical, surgical, family and social history reviewed and updated as indicated. Interim medical history since our last visit reviewed. Allergies and medications reviewed and updated.  Review of Systems  Constitutional: Negative.   Respiratory: Negative.   Cardiovascular: Negative.  Psychiatric/Behavioral: Negative.     Per HPI unless specifically indicated above     Objective:    BP 134/85 (BP Location: Left Arm, Patient Position: Sitting, Cuff Size: Large)   Pulse 73   Temp 98.6 F (37 C)   Wt 244 lb (110.7 kg)   LMP 07/01/2013 Comment: tubal  SpO2 97%   BMI 40.60 kg/m   Wt Readings from Last 3 Encounters:  09/15/16 244 lb (110.7 kg)  08/18/16 233 lb (105.7 kg)  08/02/16 247 lb (112 kg)    Physical Exam  Constitutional: She is oriented to person, place, and time. She appears well-developed and well-nourished. No  distress.  HENT:  Head: Normocephalic and atraumatic.  Right Ear: Hearing normal.  Left Ear: Hearing normal.  Nose: Nose normal.  Eyes: Conjunctivae and lids are normal. Right eye exhibits no discharge. Left eye exhibits no discharge. No scleral icterus.  Cardiovascular: Normal rate, regular rhythm, normal heart sounds and intact distal pulses.  Exam reveals no gallop and no friction rub.   No murmur heard. Pulmonary/Chest: Effort normal and breath sounds normal. No respiratory distress. She has no wheezes. She has no rales. She exhibits no tenderness.  Musculoskeletal: Normal range of motion.  Neurological: She is alert and oriented to person, place, and time.  Skin: Skin is warm, dry and intact. No rash noted. No erythema. No pallor.  Psychiatric: She has a normal mood and affect. Her speech is normal and behavior is normal. Judgment and thought content normal. Cognition and memory are normal.  Nursing note and vitals reviewed.   Results for orders placed or performed during the hospital encounter of 08/18/16  Urine culture  Result Value Ref Range   Specimen Description URINE, RANDOM    Special Requests NONE    Culture MULTIPLE SPECIES PRESENT, SUGGEST RECOLLECTION (A)    Report Status 08/19/2016 FINAL   Basic metabolic panel  Result Value Ref Range   Sodium 138 135 - 145 mmol/L   Potassium 3.6 3.5 - 5.1 mmol/L   Chloride 107 101 - 111 mmol/L   CO2 23 22 - 32 mmol/L   Glucose, Bld 91 65 - 99 mg/dL   BUN 14 6 - 20 mg/dL   Creatinine, Ser 0.91 0.44 - 1.00 mg/dL   Calcium 9.0 8.9 - 10.3 mg/dL   GFR calc non Af Amer >60 >60 mL/min   GFR calc Af Amer >60 >60 mL/min   Anion gap 8 5 - 15  CBC  Result Value Ref Range   WBC 5.7 3.6 - 11.0 K/uL   RBC 4.67 3.80 - 5.20 MIL/uL   Hemoglobin 14.1 12.0 - 16.0 g/dL   HCT 41.3 35.0 - 47.0 %   MCV 88.4 80.0 - 100.0 fL   MCH 30.3 26.0 - 34.0 pg   MCHC 34.2 32.0 - 36.0 g/dL   RDW 14.1 11.5 - 14.5 %   Platelets 216 150 - 440 K/uL    Urinalysis, Complete w Microscopic  Result Value Ref Range   Color, Urine YELLOW (A) YELLOW   APPearance CLOUDY (A) CLEAR   Specific Gravity, Urine 1.008 1.005 - 1.030   pH 5.0 5.0 - 8.0   Glucose, UA NEGATIVE NEGATIVE mg/dL   Hgb urine dipstick NEGATIVE NEGATIVE   Bilirubin Urine NEGATIVE NEGATIVE   Ketones, ur NEGATIVE NEGATIVE mg/dL   Protein, ur NEGATIVE NEGATIVE mg/dL   Nitrite NEGATIVE NEGATIVE   Leukocytes, UA LARGE (A) NEGATIVE   RBC / HPF TOO NUMEROUS TO COUNT 0 -  5 RBC/hpf   WBC, UA 6-30 0 - 5 WBC/hpf   Bacteria, UA FEW (A) NONE SEEN   Squamous Epithelial / LPF NONE SEEN NONE SEEN   Mucous PRESENT   Hepatic function panel  Result Value Ref Range   Total Protein 7.4 6.5 - 8.1 g/dL   Albumin 4.5 3.5 - 5.0 g/dL   AST 18 15 - 41 U/L   ALT 13 (L) 14 - 54 U/L   Alkaline Phosphatase 44 38 - 126 U/L   Total Bilirubin 0.5 0.3 - 1.2 mg/dL   Bilirubin, Direct 0.1 0.1 - 0.5 mg/dL   Indirect Bilirubin 0.4 0.3 - 0.9 mg/dL  Lipase, blood  Result Value Ref Range   Lipase 16 11 - 51 U/L  Troponin I  Result Value Ref Range   Troponin I <0.03 <0.03 ng/mL  TSH  Result Value Ref Range   TSH 0.860 0.350 - 4.500 uIU/mL  Brain natriuretic peptide  Result Value Ref Range   B Natriuretic Peptide 45.0 0.0 - 100.0 pg/mL  Fibrin derivatives D-Dimer ([facility] only)  Result Value Ref Range   Fibrin derivatives D-dimer ([facility]) 333.98 0.00 - 499.00      Assessment & Plan:   Problem List Items Addressed This Visit      Genitourinary   Nephrolithiasis    Resolved. Continue to follow with urology. Call with any concerns.         Other   Depression - Primary    No better on celexa. Will change to cymbalta to help with pain from fibro. OK to increase buspar to TID PRN, follow up 3 weeks, if not significantly better, will add wellbutrin.       Relevant Medications   busPIRone (BUSPAR) 15 MG tablet   DULoxetine (CYMBALTA) 60 MG capsule   Behcet's disease ([place])    Continue to follow  with rheumatology. Call with any concerns.           Follow up plan: Return in about 3 weeks (around 10/06/2016) for Follow up mood.

## 2016-09-21 DIAGNOSIS — H832X2 Labyrinthine dysfunction, left ear: Secondary | ICD-10-CM | POA: Insufficient documentation

## 2016-09-21 DIAGNOSIS — R519 Headache, unspecified: Secondary | ICD-10-CM | POA: Insufficient documentation

## 2016-09-21 DIAGNOSIS — R51 Headache: Secondary | ICD-10-CM

## 2016-09-21 DIAGNOSIS — G932 Benign intracranial hypertension: Secondary | ICD-10-CM | POA: Insufficient documentation

## 2016-09-23 ENCOUNTER — Other Ambulatory Visit: Payer: Self-pay | Admitting: Family Medicine

## 2016-09-26 NOTE — Telephone Encounter (Signed)
Routing to provider. Follow up on 10/06/16.

## 2016-10-06 ENCOUNTER — Ambulatory Visit (INDEPENDENT_AMBULATORY_CARE_PROVIDER_SITE_OTHER): Payer: BC Managed Care – PPO | Admitting: Family Medicine

## 2016-10-06 ENCOUNTER — Encounter: Payer: Self-pay | Admitting: Family Medicine

## 2016-10-06 VITALS — BP 126/86 | HR 74 | Temp 98.2°F | Wt 239.7 lb

## 2016-10-06 DIAGNOSIS — F331 Major depressive disorder, recurrent, moderate: Secondary | ICD-10-CM | POA: Diagnosis not present

## 2016-10-06 DIAGNOSIS — G932 Benign intracranial hypertension: Secondary | ICD-10-CM | POA: Diagnosis not present

## 2016-10-06 MED ORDER — BUPROPION HCL ER (SR) 150 MG PO TB12: ORAL_TABLET | ORAL | 3 refills | Status: DC

## 2016-10-06 MED ORDER — TOPIRAMATE 50 MG PO TABS: ORAL_TABLET | ORAL | 0 refills | Status: DC

## 2016-10-06 NOTE — Assessment & Plan Note (Signed)
Not under great control. Will add wellbutrin and recheck in 1-2 months.

## 2016-10-06 NOTE — Assessment & Plan Note (Signed)
Seeing specialist at [facility], feeling much better since having pressure released. Being changed from Topamax to diamox- will wean off topamax. Call with any concerns.

## 2016-10-06 NOTE — Patient Instructions (Signed)
TOPAMAX TAPER 150mg  BID x 5 days 100mg  QAM 150mg qPM x5 days 100mg  BID x5days 50mg QAM and 100mg qPM x 5 days 50mg  BID x 5 days 25mg  QAM and 50mg  QPM x 5 days 25mg  BID x 5 days 25mg  qHS x 5 days Then Stop!

## 2016-10-06 NOTE — Progress Notes (Signed)
BP 126/86 (BP Location: Left Arm, Patient Position: Sitting, Cuff Size: Normal)   Pulse 74   Temp 98.2 F (36.8 C)   Wt 239 lb 11.2 oz (108.7 kg)   LMP 07/01/2013 Comment: tubal  SpO2 97%   BMI 39.89 kg/m    Subjective:    Patient ID: Shirley Rogers, female    DOB: 18-Nov-1965, 51 y.o.   MRN: [id number]  HPI: Shirley Rogers is a 51 y.o. female  Chief Complaint  Patient presents with  . Depression   DEPRESSION Mood status: stable Satisfied with current treatment?: no Symptom severity: moderate  Duration of current treatment : chronic Side effects: no Medication compliance: excellent compliance Psychotherapy/counseling: no  Previous psychiatric medications: cymbalta, celexa Depressed mood: yes Anxious mood: yes Anhedonia: no Significant weight loss or gain: no Insomnia: yes  Fatigue: yes Feelings of worthlessness or guilt: yes Impaired concentration/indecisiveness: yes Suicidal ideations: no Hopelessness: yes Crying spells: yes Depression screen [facility] 2/9 10/06/2016 05/31/2016 05/08/2015 04/10/2015 03/30/2015  Decreased Interest 3 1 1  0 1  Down, Depressed, Hopeless 2 0 0 1 2  PHQ - 2 Score 5 1 1 1 3   Altered sleeping 2 0 - 3 3  Tired, decreased energy 3 1 - 1 2  Change in appetite 2 1 - 0 2  Feeling bad or failure about yourself  1 0 - 1 1  Trouble concentrating 2 0 - 2 2  Moving slowly or fidgety/restless 1 0 - 1 1  Suicidal thoughts 0 0 - 0 0  PHQ-9 Score 16 3 - 9 14  Difficult doing work/chores - - - Somewhat difficult Very difficult   Went to see neurology at [facility] and had another LP- still high pressure. Will continue to see them, they are changing her topamax to diamox  Relevant past medical, surgical, family and social history reviewed and updated as indicated. Interim medical history since our last visit reviewed. Allergies and medications reviewed and updated.  Review of Systems  Constitutional: Negative.   Respiratory: Negative.   Cardiovascular:  Negative.   Psychiatric/Behavioral: Positive for dysphoric mood. Negative for agitation, behavioral problems, confusion, decreased concentration, hallucinations, self-injury, sleep disturbance and suicidal ideas. The patient is nervous/anxious. The patient is not hyperactive.     Per HPI unless specifically indicated above     Objective:    BP 126/86 (BP Location: Left Arm, Patient Position: Sitting, Cuff Size: Normal)   Pulse 74   Temp 98.2 F (36.8 C)   Wt 239 lb 11.2 oz (108.7 kg)   LMP 07/01/2013 Comment: tubal  SpO2 97%   BMI 39.89 kg/m   Wt Readings from Last 3 Encounters:  10/06/16 239 lb 11.2 oz (108.7 kg)  09/15/16 244 lb (110.7 kg)  08/18/16 233 lb (105.7 kg)    Physical Exam  Constitutional: She is oriented to person, place, and time. She appears well-developed and well-nourished. No distress.  HENT:  Head: Normocephalic and atraumatic.  Right Ear: Hearing normal.  Left Ear: Hearing normal.  Nose: Nose normal.  Eyes: Conjunctivae and lids are normal. Right eye exhibits no discharge. Left eye exhibits no discharge. No scleral icterus.  Cardiovascular: Normal rate, regular rhythm, normal heart sounds and intact distal pulses.  Exam reveals no gallop and no friction rub.   No murmur heard. Pulmonary/Chest: Effort normal and breath sounds normal. No respiratory distress. She has no wheezes. She has no rales. She exhibits no tenderness.  Musculoskeletal: Normal range of motion.  Neurological: She is alert  and oriented to person, place, and time.  Skin: Skin is warm, dry and intact. No rash noted. No erythema. No pallor.  Psychiatric: She has a normal mood and affect. Her speech is normal and behavior is normal. Judgment and thought content normal. Cognition and memory are normal.  Nursing note and vitals reviewed.   Results for orders placed or performed during the hospital encounter of 08/18/16  Urine culture  Result Value Ref Range   Specimen Description URINE,  RANDOM    Special Requests NONE    Culture MULTIPLE SPECIES PRESENT, SUGGEST RECOLLECTION (A)    Report Status 08/19/2016 FINAL   Basic metabolic panel  Result Value Ref Range   Sodium 138 135 - 145 mmol/L   Potassium 3.6 3.5 - 5.1 mmol/L   Chloride 107 101 - 111 mmol/L   CO2 23 22 - 32 mmol/L   Glucose, Bld 91 65 - 99 mg/dL   BUN 14 6 - 20 mg/dL   Creatinine, Ser 0.91 0.44 - 1.00 mg/dL   Calcium 9.0 8.9 - 10.3 mg/dL   GFR calc non Af Amer >60 >60 mL/min   GFR calc Af Amer >60 >60 mL/min   Anion gap 8 5 - 15  CBC  Result Value Ref Range   WBC 5.7 3.6 - 11.0 K/uL   RBC 4.67 3.80 - 5.20 MIL/uL   Hemoglobin 14.1 12.0 - 16.0 g/dL   HCT 41.3 35.0 - 47.0 %   MCV 88.4 80.0 - 100.0 fL   MCH 30.3 26.0 - 34.0 pg   MCHC 34.2 32.0 - 36.0 g/dL   RDW 14.1 11.5 - 14.5 %   Platelets 216 150 - 440 K/uL  Urinalysis, Complete w Microscopic  Result Value Ref Range   Color, Urine YELLOW (A) YELLOW   APPearance CLOUDY (A) CLEAR   Specific Gravity, Urine 1.008 1.005 - 1.030   pH 5.0 5.0 - 8.0   Glucose, UA NEGATIVE NEGATIVE mg/dL   Hgb urine dipstick NEGATIVE NEGATIVE   Bilirubin Urine NEGATIVE NEGATIVE   Ketones, ur NEGATIVE NEGATIVE mg/dL   Protein, ur NEGATIVE NEGATIVE mg/dL   Nitrite NEGATIVE NEGATIVE   Leukocytes, UA LARGE (A) NEGATIVE   RBC / HPF TOO NUMEROUS TO COUNT 0 - 5 RBC/hpf   WBC, UA 6-30 0 - 5 WBC/hpf   Bacteria, UA FEW (A) NONE SEEN   Squamous Epithelial / LPF NONE SEEN NONE SEEN   Mucous PRESENT   Hepatic function panel  Result Value Ref Range   Total Protein 7.4 6.5 - 8.1 g/dL   Albumin 4.5 3.5 - 5.0 g/dL   AST 18 15 - 41 U/L   ALT 13 (L) 14 - 54 U/L   Alkaline Phosphatase 44 38 - 126 U/L   Total Bilirubin 0.5 0.3 - 1.2 mg/dL   Bilirubin, Direct 0.1 0.1 - 0.5 mg/dL   Indirect Bilirubin 0.4 0.3 - 0.9 mg/dL  Lipase, blood  Result Value Ref Range   Lipase 16 11 - 51 U/L  Troponin I  Result Value Ref Range   Troponin I <0.03 <0.03 ng/mL  TSH  Result Value Ref  Range   TSH 0.860 0.350 - 4.500 uIU/mL  Brain natriuretic peptide  Result Value Ref Range   B Natriuretic Peptide 45.0 0.0 - 100.0 pg/mL  Fibrin derivatives D-Dimer ([facility] only)  Result Value Ref Range   Fibrin derivatives D-dimer ([facility]) 333.98 0.00 - 499.00      Assessment & Plan:   Problem List Items Addressed This  Visit      Nervous and Auditory   Increased intracranial pressure    Seeing specialist at [facility], feeling much better since having pressure released. Being changed from Topamax to diamox- will wean off topamax. Call with any concerns.         Other   Depression - Primary    Not under great control. Will add wellbutrin and recheck in 1-2 months.       Relevant Medications   buPROPion (WELLBUTRIN SR) 150 MG 12 hr tablet       Follow up plan: Return After 12/07/16 , for Physical.

## 2016-10-17 ENCOUNTER — Inpatient Hospital Stay: Payer: BC Managed Care – PPO | Admitting: Hematology and Oncology

## 2016-10-25 ENCOUNTER — Telehealth: Payer: Self-pay | Admitting: Family Medicine

## 2016-10-25 MED ORDER — TRAMADOL HCL 50 MG PO TABS: 50.0000 mg | ORAL_TABLET | Freq: Two times a day (BID) | ORAL | 0 refills | Status: DC | PRN

## 2016-10-25 NOTE — Telephone Encounter (Signed)
Patient would like to get a script for her Tramadol.    If it can be faxed to [facility] on [address] or if she needs to pick it up just advise.  Thanks  [employer]  [phone]

## 2016-10-25 NOTE — Telephone Encounter (Signed)
Rx up front OK to fax

## 2016-10-25 NOTE — Telephone Encounter (Signed)
Patient notified

## 2016-10-29 ENCOUNTER — Other Ambulatory Visit: Payer: Self-pay | Admitting: Family Medicine

## 2016-11-08 ENCOUNTER — Encounter: Payer: Self-pay | Admitting: Hematology and Oncology

## 2016-11-08 ENCOUNTER — Inpatient Hospital Stay: Payer: BC Managed Care – PPO | Attending: Hematology and Oncology | Admitting: Hematology and Oncology

## 2016-11-08 ENCOUNTER — Ambulatory Visit: Payer: BC Managed Care – PPO | Admitting: Family Medicine

## 2016-11-08 VITALS — BP 145/93 | HR 70 | Temp 97.8°F | Wt 240.6 lb

## 2016-11-08 DIAGNOSIS — F17210 Nicotine dependence, cigarettes, uncomplicated: Secondary | ICD-10-CM | POA: Insufficient documentation

## 2016-11-08 DIAGNOSIS — R5383 Other fatigue: Secondary | ICD-10-CM

## 2016-11-08 DIAGNOSIS — Z87442 Personal history of urinary calculi: Secondary | ICD-10-CM | POA: Diagnosis not present

## 2016-11-08 DIAGNOSIS — Z8673 Personal history of transient ischemic attack (TIA), and cerebral infarction without residual deficits: Secondary | ICD-10-CM | POA: Diagnosis not present

## 2016-11-08 DIAGNOSIS — M352 Behcet's disease: Secondary | ICD-10-CM | POA: Insufficient documentation

## 2016-11-08 DIAGNOSIS — K219 Gastro-esophageal reflux disease without esophagitis: Secondary | ICD-10-CM | POA: Diagnosis not present

## 2016-11-08 DIAGNOSIS — R52 Pain, unspecified: Secondary | ICD-10-CM

## 2016-11-08 DIAGNOSIS — K760 Fatty (change of) liver, not elsewhere classified: Secondary | ICD-10-CM | POA: Diagnosis not present

## 2016-11-08 DIAGNOSIS — E785 Hyperlipidemia, unspecified: Secondary | ICD-10-CM

## 2016-11-08 DIAGNOSIS — R42 Dizziness and giddiness: Secondary | ICD-10-CM | POA: Diagnosis not present

## 2016-11-08 DIAGNOSIS — Z86718 Personal history of other venous thrombosis and embolism: Secondary | ICD-10-CM | POA: Insufficient documentation

## 2016-11-08 DIAGNOSIS — M329 Systemic lupus erythematosus, unspecified: Secondary | ICD-10-CM | POA: Diagnosis not present

## 2016-11-08 DIAGNOSIS — M5136 Other intervertebral disc degeneration, lumbar region: Secondary | ICD-10-CM | POA: Diagnosis not present

## 2016-11-08 DIAGNOSIS — F419 Anxiety disorder, unspecified: Secondary | ICD-10-CM | POA: Diagnosis not present

## 2016-11-08 DIAGNOSIS — Z882 Allergy status to sulfonamides status: Secondary | ICD-10-CM | POA: Insufficient documentation

## 2016-11-08 DIAGNOSIS — Z79899 Other long term (current) drug therapy: Secondary | ICD-10-CM | POA: Insufficient documentation

## 2016-11-08 DIAGNOSIS — Z87440 Personal history of urinary (tract) infections: Secondary | ICD-10-CM | POA: Diagnosis not present

## 2016-11-08 DIAGNOSIS — Z86711 Personal history of pulmonary embolism: Secondary | ICD-10-CM | POA: Insufficient documentation

## 2016-11-08 DIAGNOSIS — G4730 Sleep apnea, unspecified: Secondary | ICD-10-CM | POA: Insufficient documentation

## 2016-11-08 DIAGNOSIS — I2699 Other pulmonary embolism without acute cor pulmonale: Secondary | ICD-10-CM

## 2016-11-08 DIAGNOSIS — R419 Unspecified symptoms and signs involving cognitive functions and awareness: Secondary | ICD-10-CM

## 2016-11-08 DIAGNOSIS — Z7901 Long term (current) use of anticoagulants: Secondary | ICD-10-CM | POA: Insufficient documentation

## 2016-11-08 DIAGNOSIS — I10 Essential (primary) hypertension: Secondary | ICD-10-CM | POA: Insufficient documentation

## 2016-11-08 DIAGNOSIS — I2510 Atherosclerotic heart disease of native coronary artery without angina pectoris: Secondary | ICD-10-CM | POA: Diagnosis not present

## 2016-11-08 NOTE — Progress Notes (Signed)
Patient offers no complaints today.  Patient reports that she just had a cousin that recently died of a brain anurysm. This cousin was a paternal cousin.  Also another cousin on paternal side of family was diagnosed with Chiari malformation with a Syrinx.  MD told family it is hereditary.  Had neurosurgery to remove small parts of skull to allow fluid to drain.  Patient just had lumbar puncture and had 20 mls of fluid removed to relieve pressure by Dr. Lorelle Gibbs @ [place].

## 2016-11-08 NOTE — Progress Notes (Signed)
[facility] day:  11/08/2016  Chief Complaint: Shirley Rogers is a 51 y.o. 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  HPI:   The patient was last seen in the medical oncology clinic on 07/20/2016.  At that time, she was seen for initial assessment.  She had a pulmonary embolism and CVA.   By report, lupus anticoagulant testing was negative.  She was on Eliquis.  We discussed likely life long anticoagulation.  Records from [facility] and [facility] were requested.   Symptomatically, she notes she pulled a muscle in her back causing pain and increased blood pressure.  She followed up with rheumatology. Liver ultrasound was apparently negative.  She was evaluated for vertigo. She states her "left testicular system was damaged". He had a lumbar puncture.  Opening pressure was 25. 20 cc of CSF were removed.   Past Medical History:  Diagnosis Date  . Anxiety    on xanax in the past  . ASCVD (arteriosclerotic cardiovascular disease)   . Barrett esophagus   . 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 Carotid atherosclerosis 2003   on ultrasound  . Collagen vascular disease ([place])   . Colon polyp   . Complicated migraine    with facial drooping  . Concussion    1974,1982,1987,2016,2016  . DDD (degenerative disc disease), lumbar    MRI 2008, L3-4, L5-S1, spondylotic changes, multilevel facet joint hypertrophic changes  . Depression    on paxil, lexapro, cymbalta in the past  . Diverticulosis   . Dizziness due to old head injury   . Family history of adverse reaction to anesthesia    father - slow to wake  . Fatty liver 2013   On CT abdomen/pelvis  . Fibrocystic breast disease   . GERD (gastroesophageal reflux disease)   . Hemiplegic migraine    daily  . Hemorrhoids   . Hyperlipidemia   . Hypertension    no meds currently.  . IBS (irritable bowel syndrome)   . Insomnia chronic  . Kidney stone 2013   on CT  abdomen/pelvis  . 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 Migraine   . Narrowing of intervertebral disc space 2011   C5-6  . Normal cardiac stress test 2009   EF 66%  . Pulmonary emboli ([place])   . Recurrent sinusitis   . Recurrent UTI    question about IC in the past  . Sleep apnea    CPAP machine broken  . Stroke ([place])    2000 - no deficits  . Umbilical hernia 9518   on CT abdomen/pelvis  . Varicose veins    right lower leg  . Vascular spasm ([place])   . Vertigo    from concussive disorder.  seeing neuro 03/23/15    Past Surgical History:  Procedure Laterality Date  . Biopsy Punch Thyroid    . BREAST EXCISIONAL BIOPSY Right 2000   right mass excision - atypical hyperplasia  . BREAST SURGERY Right    atypical hyperplasia  . CHOLECYSTECTOMY N/A 07/24/2015   Procedure: LAPAROSCOPIC CHOLECYSTECTOMY WITH INTRAOPERATIVE CHOLANGIOGRAM;  Surgeon: Leonie Green, MD;  Location: [facility] ORS;  Service: General;  Laterality: N/A;  . COLONOSCOPY W/ BIOPSIES     Removed 5 polyps  . TUBAL LIGATION      Family History  Problem Relation Age of Onset  . Other Mother  IBS  . Hypertension Mother   . Heart disease Father   . Diverticulosis Father   . Migraines Father   . Other Son        Ulcerative Colitis, Polonidal cyst  . Parkinson's disease Maternal Grandmother   . Cancer Maternal Grandmother   . Mental illness Paternal Grandmother   . Stroke Paternal Grandmother   . Cancer Paternal Grandmother        brain cancer  . Bipolar disorder Paternal Grandmother   . Stroke Maternal Grandfather   . Cancer Maternal Grandfather        Bladder, Prostate/bladder/prostate  . Heart disease Paternal Grandfather   . Heart attack Paternal Grandfather   . Macular degeneration Unknown   . Diabetes Brother   . Breast cancer Maternal Aunt     Social History:  reports that she has been smoking Cigarettes.  She has a 0.25  pack-year smoking history. She has never used smokeless tobacco. She reports that she does not drink alcohol or use drugs.  She smokes infrequently. 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  She is divorced. She graduated from [employer]. She lives in [place].  The patient is alone today.  Allergies:  Allergies  Allergen Reactions  . Rocephin [Ceftriaxone] Anaphylaxis  . Statins Anaphylaxis, Diarrhea and Nausea And Vomiting  . Compazine [Prochlorperazine] Other (See Comments)    akathesia akathesia akathesia  . Doxycycline Nausea And Vomiting  . Erythromycin Itching, Nausea And Vomiting and Rash    Patient states she is allergic to all mycin drugs  . Sulfa Antibiotics Diarrhea and Nausea And Vomiting  . Meloxicam   . Sulfamethoxazole-Trimethoprim Other (See Comments)  . Ace Inhibitors Itching  . Biaxin [Clarithromycin] Itching, Nausea And Vomiting and Rash  . Latex Rash  . Morphine And Related Nausea And Vomiting    Current Medications: Current Outpatient Prescriptions  Medication Sig Dispense Refill  . apixaban (ELIQUIS) 5 MG TABS tablet Take 1 tablet (5 mg total) by mouth 2 (two) times daily. 60 tablet 5  . azaTHIOprine (IMURAN) 50 MG tablet Take 50 mg by mouth 2 (two) times daily.    Marland Kitchen b complex vitamins tablet Take 1 tablet by mouth daily.    Marland Kitchen buPROPion (WELLBUTRIN SR) 150 MG 12 hr tablet 1 tab qAM x 3 days, then 1 tab BID after that 60 tablet 3  . busPIRone (BUSPAR) 15 MG tablet Take 1 tablet (15 mg total) by mouth 3 (three) times daily as needed. 90 tablet 3  . diphenhydrAMINE (BENADRYL) 25 mg capsule Take 2 capsules (50 mg total) by mouth every 6 (six) hours as needed. 120 capsule 12  . DULoxetine (CYMBALTA) 60 MG capsule Take 1 capsule (60 mg total) by mouth daily. 30 capsule 3  . Elastic Bandages & Supports (T.E.D. THIGH LENGTH/L-REGULAR) MISC 2 application by Does not apply route every morning. 4 each 0  . EPINEPHrine (EPIPEN 2-PAK) 0.3 mg/0.3 mL IJ SOAJ  injection Inject 0.3 mLs (0.3 mg total) into the muscle once. 1 Device 12  . gabapentin (NEURONTIN) 300 MG capsule TAKE 1 CAPSULE(300 MG) BY MOUTH THREE TIMES DAILY    . hydrochlorothiazide (HYDRODIURIL) 25 MG tablet TAKE 1 TABLET(25 MG) BY MOUTH DAILY 90 tablet 1  . Magnesium Oxide 140 MG CAPS Take 280 mg by mouth daily. Reported on 05/14/2015    . methazolamide (NEPTAZANE) 25 MG tablet Take 25 mg by mouth 3 (three) times daily.    Marland Kitchen omeprazole (PRILOSEC) 40 MG capsule Take  40 mg by mouth daily.    Marland Kitchen PROAIR HFA 108 (90 Base) MCG/ACT inhaler INHALE 2 PUFFS BY MOUTH EVERY 4 HOURS AS NEEDED FOR WHEEZING 17 g 0  . traMADol (ULTRAM) 50 MG tablet Take 1 tablet (50 mg total) by mouth 2 (two) times daily as needed. 56 tablet 0  . traZODone (DESYREL) 100 MG tablet TAKE 1 TABLET(100 MG) BY MOUTH AT BEDTIME AS NEEDED FOR SLEEP 90 tablet 1  . verapamil (CALAN-SR) 240 MG CR tablet Take 1 tablet (240 mg total) by mouth daily. 90 tablet 3  . furosemide (LASIX) 20 MG tablet TAKE 1 TABLET(20 MG) BY MOUTH DAILY (Patient not taking: Reported on 11/08/2016) 90 tablet 1  . potassium chloride (K-DUR) 10 MEQ tablet Take 10 mEq by mouth 2 (two) times daily.    . promethazine (PHENERGAN) 25 MG tablet Take 1 tablet (25 mg total) by mouth every 8 (eight) hours as needed for nausea or vomiting. (Patient not taking: Reported on 11/08/2016) 20 tablet 0  . topiramate (TOPAMAX) 50 MG tablet SEE NOTES (Patient not taking: Reported on 11/08/2016) 115 tablet 0   No current facility-administered medications for this visit.    Facility-Administered Medications Ordered in Other Visits  Medication Dose Route Frequency Provider Last Rate Last Dose  . gadopentetate dimeglumine (MAGNEVIST) injection 20 mL  20 mL Intravenous Once PRN Penumalli, Earlean Polka, MD        Review of Systems:  GENERAL:  Fatigue.  No fevers, sweats or weight loss. PERFORMANCE STATUS (ECOG):  1-2 HEENT:  Left vestibular system "damaged".  No visual changes, runny  nose, sore throat, mouth sores or tenderness. Lungs: No shortness of breath or cough.  No hemoptysis. Cardiac:  No chest pain, palpitations, orthopnea, or PND. GI:  Nausea.  No vomiting, diarrhea, constipation, melena or hematochezia. GU:  No urgency, frequency, dysuria, or hematuria.  Dark colored urine Musculoskeletal:  Generalized pain (legs, back, neck).  Pulled muscle in back.  Joint pain with morning stiffness.  No muscle tenderness. Extremities:  No pain or swelling. Skin:  Sun sensitivity.  No rashes or skin changes. Neuro:  Hands and feet "fall asleep".  Chronic dizziness.  Migraine headaches.  No focal numbness or weakness, balance or coordination issues. Endocrine:  No diabetes, thyroid issues, hot flashes or night sweats. Psych:  No mood changes, depression or anxiety. Pain:  No focal pain. Review of systems:  All other systems reviewed and found to be negative.  Physical Exam: Blood pressure 123/83, pulse 73, temperature 97.6 F (36.4 C), temperature source Tympanic, resp. rate 18, weight 245 lb 9.5 oz (111.4 kg).  Last menstrual period 07/01/2013.  GENERAL: Fatigued appearing woman sitting comfortably in the exam room in no acute distress. MENTAL STATUS:  Alert and oriented to person, place and time. HEAD: Short brown hair.  Normocephalic, atraumatic, face symmetric, no Cushingoid features. EYES:  Glasses.  Green eyes.  Pupils equal round and reactive to light and accomodation.  No conjunctivitis or scleral icterus. ENT:  Oropharynx clear without lesion.  No oral ulcers.  Tongue normal. Mucous membranes moist.  RESPIRATORY:  Clear to auscultation without rales, wheezes or rhonchi. CARDIOVASCULAR:  Regular rate and rhythm without murmur, rub or gallop. ABDOMEN:  Soft, non-tender, with active bowel sounds, and no appreciable hepatosplenomegaly.  No masses. SKIN:  No rashes, ulcers or lesions. EXTREMITIES: No edema, no skin discoloration or tenderness.  No palpable cords. LYMPH  NODES: No palpable cervical, supraclavicular, axillary or inguinal adenopathy  NEUROLOGICAL: Unremarkable. PSYCH:  Appropriate.   No visits with results within 3 Day(s) from this visit.  Latest known visit with results is:  Admission on 08/18/2016, Discharged on 08/18/2016  Component Date Value Ref Range Status  . Sodium 08/18/2016 138  135 - 145 mmol/L Final  . Potassium 08/18/2016 3.6  3.5 - 5.1 mmol/L Final  . Chloride 08/18/2016 107  101 - 111 mmol/L Final  . CO2 08/18/2016 23  22 - 32 mmol/L Final  . Glucose, Bld 08/18/2016 91  65 - 99 mg/dL Final  . BUN 08/18/2016 14  6 - 20 mg/dL Final  . Creatinine, Ser 08/18/2016 0.91  0.44 - 1.00 mg/dL Final  . Calcium 08/18/2016 9.0  8.9 - 10.3 mg/dL Final  . GFR calc non Af Amer 08/18/2016 >60  >60 mL/min Final  . GFR calc Af Amer 08/18/2016 >60  >60 mL/min Final   Comment: (NOTE) The eGFR has been calculated using the CKD EPI equation. This calculation has not been validated in all clinical situations. eGFR's persistently <60 mL/min signify possible Chronic Kidney Disease.   . Anion gap 08/18/2016 8  5 - 15 Final  . WBC 08/18/2016 5.7  3.6 - 11.0 K/uL Final  . RBC 08/18/2016 4.67  3.80 - 5.20 MIL/uL Final  . Hemoglobin 08/18/2016 14.1  12.0 - 16.0 g/dL Final  . HCT 08/18/2016 41.3  35.0 - 47.0 % Final  . MCV 08/18/2016 88.4  80.0 - 100.0 fL Final  . MCH 08/18/2016 30.3  26.0 - 34.0 pg Final  . MCHC 08/18/2016 34.2  32.0 - 36.0 g/dL Final  . RDW 08/18/2016 14.1  11.5 - 14.5 % Final  . Platelets 08/18/2016 216  150 - 440 K/uL Final  . Color, Urine 08/18/2016 YELLOW* YELLOW Final  . APPearance 08/18/2016 CLOUDY* CLEAR Final  . Specific Gravity, Urine 08/18/2016 1.008  1.005 - 1.030 Final  . pH 08/18/2016 5.0  5.0 - 8.0 Final  . Glucose, UA 08/18/2016 NEGATIVE  NEGATIVE mg/dL Final  . Hgb urine dipstick 08/18/2016 NEGATIVE  NEGATIVE Final  . Bilirubin Urine 08/18/2016 NEGATIVE  NEGATIVE Final  . Ketones, ur 08/18/2016 NEGATIVE   NEGATIVE mg/dL Final  . Protein, ur 08/18/2016 NEGATIVE  NEGATIVE mg/dL Final  . Nitrite 08/18/2016 NEGATIVE  NEGATIVE Final  . Leukocytes, UA 08/18/2016 LARGE* NEGATIVE Final  . RBC / HPF 08/18/2016 TOO NUMEROUS TO COUNT  0 - 5 RBC/hpf Final  . WBC, UA 08/18/2016 6-30  0 - 5 WBC/hpf Final  . Bacteria, UA 08/18/2016 FEW* NONE SEEN Final  . Squamous Epithelial / LPF 08/18/2016 NONE SEEN  NONE SEEN Final  . Mucous 08/18/2016 PRESENT   Final  . Total Protein 08/18/2016 7.4  6.5 - 8.1 g/dL Final  . Albumin 08/18/2016 4.5  3.5 - 5.0 g/dL Final  . AST 08/18/2016 18  15 - 41 U/L Final  . ALT 08/18/2016 13* 14 - 54 U/L Final  . Alkaline Phosphatase 08/18/2016 44  38 - 126 U/L Final  . Total Bilirubin 08/18/2016 0.5  0.3 - 1.2 mg/dL Final  . Bilirubin, Direct 08/18/2016 0.1  0.1 - 0.5 mg/dL Final  . Indirect Bilirubin 08/18/2016 0.4  0.3 - 0.9 mg/dL Final  . Lipase 08/18/2016 16  11 - 51 U/L Final  . Troponin I 08/18/2016 <0.03  <0.03 ng/mL Final  . TSH 08/18/2016 0.860  0.350 - 4.500 uIU/mL Final   Performed by a 3rd Generation assay with a functional sensitivity of <=0.01 uIU/mL.  . B Natriuretic Peptide 08/18/2016 45.0  0.0 - 100.0 pg/mL Final  . Fibrin derivatives D-dimer ([facility]) 08/18/2016 333.98  0.00 - 499.00 Final   Comment: (NOTE) <> Exclusion of Venous Thromboembolism (VTE) - OUTPATIENT ONLY   (Emergency Department or [place])   0-499 ng/ml (FEU): With a low to intermediate pretest probability                      for VTE this test result excludes the diagnosis                      of VTE.   >499 ng/ml (FEU) : VTE not excluded; additional work up for VTE is                      required. <> Testing on Inpatients and Evaluation of Disseminated Intravascular   Coagulation (DIC) Reference Range:   0-499 ng/ml (FEU)   . Specimen Description 08/18/2016 URINE, RANDOM   Final  . Special Requests 08/18/2016 NONE   Final  . Culture 08/18/2016 MULTIPLE SPECIES PRESENT, SUGGEST RECOLLECTION*   Final  . Report Status 08/18/2016 08/19/2016 FINAL   Final    Assessment:  RAVEENA HEBDON is a 51 y.o. 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  Lupus anticoagulant testing in the past was negative.  She is on chronic anticoagulation (Eliquis).  She had a CVA in 2000 (treated in [place], [place]) which caused dysphagia and unilateral paresthesias and weakness.  Imaging studies suggested a right basal ganglia lacunar infarction. She was treated with Plavix.  She was admitted to [facility] with a pulmonary embolism on 03/10/2016.  Chest CT angiogram revealed a right lower lobe superior segmental pulmonary embolism.  There was no right heart strain.  Bilateral lower extremity duplex on 03/11/2016 revealed no evidence of deep venous thrombosis.  She was treated with Lovenox and transitioned to Eliquis.  She was admitted to [facility] from 06/10/2016 - 06/11/2016 with left-sided facial droop and right sided (upper and lower extremity) weakness. CTA of the brain was negative. She was seen by Neurology.  Recommendation was to continue Eliquis.  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  She has chronic fatigue, arthritis, sicca symptoms, Raynaud's, osteoarthritis, fibromyalgia, and post-concussive syndrome.  She has been treated with Plaquenil (1990s), MTX (1990s-2000), Azathioprine (1990s).  She is off Imuran.  Symptomatically, she has has had issues with vertigo.  She recently had a lumbar puncture.  Exam is stable.  Plan: 1.  Discuss interim events.  Discuss records obtained.  2.  Consider additional testing:  [facility] by flow cytometry, lupus anticoagulant. 3.  Continue Eliquis 5 mg BID. 4.  RTC prn.   Lequita Asal, MD  11/08/2016

## 2016-11-29 ENCOUNTER — Telehealth: Payer: Self-pay | Admitting: Family Medicine

## 2016-11-29 MED ORDER — TRAMADOL HCL 50 MG PO TABS: 50.0000 mg | ORAL_TABLET | Freq: Two times a day (BID) | ORAL | 0 refills | Status: DC | PRN

## 2016-11-29 NOTE — Telephone Encounter (Signed)
Pt would like to have a refill for traMADol (ULTRAM) 50 MG tablet. She is no longer on 28 day and now calls for refills.

## 2016-12-04 ENCOUNTER — Other Ambulatory Visit: Payer: Self-pay | Admitting: Family Medicine

## 2016-12-12 ENCOUNTER — Encounter: Payer: BC Managed Care – PPO | Admitting: Family Medicine

## 2016-12-13 ENCOUNTER — Ambulatory Visit (INDEPENDENT_AMBULATORY_CARE_PROVIDER_SITE_OTHER): Payer: BC Managed Care – PPO | Admitting: Family Medicine

## 2016-12-13 ENCOUNTER — Encounter: Payer: Self-pay | Admitting: Family Medicine

## 2016-12-13 VITALS — BP 126/87 | HR 78 | Temp 98.7°F | Ht 64.4 in | Wt 242.4 lb

## 2016-12-13 DIAGNOSIS — Z124 Encounter for screening for malignant neoplasm of cervix: Secondary | ICD-10-CM

## 2016-12-13 DIAGNOSIS — Z1239 Encounter for other screening for malignant neoplasm of breast: Secondary | ICD-10-CM

## 2016-12-13 DIAGNOSIS — F331 Major depressive disorder, recurrent, moderate: Secondary | ICD-10-CM

## 2016-12-13 DIAGNOSIS — Z0000 Encounter for general adult medical examination without abnormal findings: Secondary | ICD-10-CM | POA: Diagnosis not present

## 2016-12-13 DIAGNOSIS — Z1211 Encounter for screening for malignant neoplasm of colon: Secondary | ICD-10-CM

## 2016-12-13 MED ORDER — BUSPIRONE HCL 15 MG PO TABS: 15.0000 mg | ORAL_TABLET | Freq: Three times a day (TID) | ORAL | 1 refills | Status: DC | PRN

## 2016-12-13 MED ORDER — BUPROPION HCL ER (SR) 150 MG PO TB12: ORAL_TABLET | ORAL | 1 refills | Status: DC

## 2016-12-13 MED ORDER — APIXABAN 5 MG PO TABS: 5.0000 mg | ORAL_TABLET | Freq: Two times a day (BID) | ORAL | 3 refills | Status: DC

## 2016-12-13 MED ORDER — DULOXETINE HCL 60 MG PO CPEP: 60.0000 mg | ORAL_CAPSULE | Freq: Every day | ORAL | 1 refills | Status: DC

## 2016-12-13 NOTE — Assessment & Plan Note (Signed)
Under good control. Continue current regimen. Continue to monitor. Call with any concerns. Refills given today. 

## 2016-12-13 NOTE — Progress Notes (Signed)
BP 126/87 (BP Location: Left Arm, Patient Position: Sitting, Cuff Size: Large)   Pulse 78   Temp 98.7 F (37.1 C)   Ht 5' 4.4" (1.636 m)   Wt 242 lb 6 oz (109.9 kg)   LMP 07/01/2013 Comment: tubal  SpO2 96%   BMI 41.09 kg/m    Subjective:    Patient ID: Shirley Rogers, female    DOB: April 22, 1966, 51 y.o.   MRN: [id number]  HPI: Shirley Rogers is a 51 y.o. female presenting on 12/13/2016 for comprehensive medical examination. Current medical complaints include:  DEPRESSION Mood status: better Satisfied with current treatment?: yes Symptom severity: mild  Duration of current treatment : months Side effects: no Medication compliance: excellent compliance Psychotherapy/counseling: yes in the past Depressed mood: no Anxious mood: no Anhedonia: no Significant weight loss or gain: no Insomnia: no  Fatigue: no Feelings of worthlessness or guilt: no Impaired concentration/indecisiveness: no Suicidal ideations: no Hopelessness: no Crying spells: no  She currently lives with: room mate Menopausal Symptoms: no  Depression Screen done today and results listed below:  Depression screen [facility] 2/9 12/13/2016 10/06/2016 05/31/2016 05/08/2015 04/10/2015  Decreased Interest 0 3 1 1  0  Down, Depressed, Hopeless 0 2 0 0 1  PHQ - 2 Score 0 5 1 1 1   Altered sleeping 1 2 0 - 3  Tired, decreased energy 0 3 1 - 1  Change in appetite 0 2 1 - 0  Feeling bad or failure about yourself  0 1 0 - 1  Trouble concentrating 0 2 0 - 2  Moving slowly or fidgety/restless 0 1 0 - 1  Suicidal thoughts 0 0 0 - 0  PHQ-9 Score 1 16 3  - 9  Difficult doing work/chores - - - - Somewhat difficult    Past Medical History:  Past Medical History:  Diagnosis Date  . Anxiety    on xanax in the past  . ASCVD (arteriosclerotic cardiovascular disease)   . Barrett esophagus   . 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 Carotid atherosclerosis 2003   on ultrasound  . Collagen vascular disease ([place])   . Colon polyp   .  Complicated migraine    with facial drooping  . Concussion    1974,1982,1987,2016,2016  . DDD (degenerative disc disease), lumbar    MRI 2008, L3-4, L5-S1, spondylotic changes, multilevel facet joint hypertrophic changes  . Depression    on paxil, lexapro, cymbalta in the past  . Diverticulosis   . Dizziness due to old head injury   . Family history of adverse reaction to anesthesia    father - slow to wake  . Fatty liver 2013   On CT abdomen/pelvis  . Fibrocystic breast disease   . GERD (gastroesophageal reflux disease)   . Hemiplegic migraine    daily  . Hemorrhoids   . Hyperlipidemia   . Hypertension    no meds currently.  . IBS (irritable bowel syndrome)   . Insomnia chronic  . Kidney stone 2013   on CT abdomen/pelvis  . 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 Migraine   . Narrowing of intervertebral disc space 2011   C5-6  . Normal cardiac stress test 2009   EF 66%  . Pulmonary emboli ([place])   . Recurrent sinusitis   . Recurrent UTI    question about IC in the past  . Sleep apnea    CPAP machine broken  .  Stroke ([place])    2000 - no deficits  . Umbilical hernia 0350   on CT abdomen/pelvis  . Varicose veins    right lower leg  . Vascular spasm ([place])   . Vertigo    from concussive disorder.  seeing neuro 03/23/15    Surgical History:  Past Surgical History:  Procedure Laterality Date  . Biopsy Punch Thyroid    . BREAST EXCISIONAL BIOPSY Right 2000   right mass excision - atypical hyperplasia  . BREAST SURGERY Right    atypical hyperplasia  . CHOLECYSTECTOMY N/A 07/24/2015   Procedure: LAPAROSCOPIC CHOLECYSTECTOMY WITH INTRAOPERATIVE CHOLANGIOGRAM;  Surgeon: Leonie Green, MD;  Location: [facility] ORS;  Service: General;  Laterality: N/A;  . COLONOSCOPY W/ BIOPSIES     Removed 5 polyps  . TUBAL LIGATION      Medications:  Current Outpatient Prescriptions on File Prior to Visit  Medication  Sig  . azaTHIOprine (IMURAN) 50 MG tablet Take 50 mg by mouth 2 (two) times daily.  Marland Kitchen b complex vitamins tablet Take 1 tablet by mouth daily.  . diphenhydrAMINE (BENADRYL) 25 mg capsule Take 2 capsules (50 mg total) by mouth every 6 (six) hours as needed.  Regino Schultze Bandages & Supports (T.E.D. THIGH LENGTH/L-REGULAR) MISC 2 application by Does not apply route every morning.  Marland Kitchen EPINEPHRINE 0.3 mg/0.3 mL IJ SOAJ injection INJECT 0.3 ML( 0.3 MG) INTO THE MUSCLE ONCE  . furosemide (LASIX) 20 MG tablet TAKE 1 TABLET(20 MG) BY MOUTH DAILY (Patient not taking: Reported on 11/08/2016)  . gabapentin (NEURONTIN) 300 MG capsule TAKE 1 CAPSULE(300 MG) BY MOUTH THREE TIMES DAILY  . hydrochlorothiazide (HYDRODIURIL) 25 MG tablet TAKE 1 TABLET(25 MG) BY MOUTH DAILY  . Magnesium Oxide 140 MG CAPS Take 280 mg by mouth daily. Reported on 05/14/2015  . methazolamide (NEPTAZANE) 25 MG tablet Take 25 mg by mouth 3 (three) times daily.  Marland Kitchen omeprazole (PRILOSEC) 40 MG capsule Take 40 mg by mouth daily.  Marland Kitchen PROAIR HFA 108 (90 Base) MCG/ACT inhaler INHALE 2 PUFFS BY MOUTH EVERY 4 HOURS AS NEEDED FOR WHEEZING  . promethazine (PHENERGAN) 25 MG tablet Take 1 tablet (25 mg total) by mouth every 8 (eight) hours as needed for nausea or vomiting. (Patient not taking: Reported on 11/08/2016)  . traMADol (ULTRAM) 50 MG tablet Take 1 tablet (50 mg total) by mouth 2 (two) times daily as needed.  . traZODone (DESYREL) 100 MG tablet TAKE 1 TABLET(100 MG) BY MOUTH AT BEDTIME AS NEEDED FOR SLEEP  . verapamil (CALAN-SR) 240 MG CR tablet Take 1 tablet (240 mg total) by mouth daily.   Current Facility-Administered Medications on File Prior to Visit  Medication  . gadopentetate dimeglumine (MAGNEVIST) injection 20 mL    Allergies:  Allergies  Allergen Reactions  . Rocephin [Ceftriaxone] Anaphylaxis  . Statins Anaphylaxis, Diarrhea and Nausea And Vomiting  . Compazine [Prochlorperazine] Other (See Comments)     akathesia akathesia akathesia  . Doxycycline Nausea And Vomiting  . Erythromycin Itching, Nausea And Vomiting and Rash    Patient states she is allergic to all mycin drugs  . Sulfa Antibiotics Diarrhea and Nausea And Vomiting  . Meloxicam   . Sulfamethoxazole-Trimethoprim Other (See Comments)  . Ace Inhibitors Itching  . Biaxin [Clarithromycin] Itching, Nausea And Vomiting and Rash  . Latex Rash  . Morphine And Related Nausea And Vomiting    Social History:  Social History   Social History  . Marital status: Divorced    Spouse name: N/A  .  Number of children: 1  . Years of education: 58   Occupational History  .      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 Smoking status: Current Some Day Smoker    Packs/day: 0.25    Years: 1.00    Types: Cigarettes    Last attempt to quit: 06/12/2015  . Smokeless tobacco: Never Used     Comment: 1-2 cigarettes   . Alcohol use No     Comment: social  . Drug use: No  . Sexual activity: No     Comment: tubal ligation and menopause   Other Topics Concern  . Not on file   Social History Narrative   Lives at home with friend   Caffeine use- coffee 1 cup daily   History  Smoking Status  . Current Some Day Smoker  . Packs/day: 0.25  . Years: 1.00  . Types: Cigarettes  . Last attempt to quit: 06/12/2015  Smokeless Tobacco  . Never Used    Comment: 1-2 cigarettes    History  Alcohol Use No    Comment: social    Family History:  Family History  Problem Relation Age of Onset  . Other Mother        IBS  . Hypertension Mother   . Heart disease Father   . Diverticulosis Father   . Migraines Father   . Other Son        Ulcerative Colitis, Polonidal cyst  . Parkinson's disease Maternal Grandmother   . Cancer Maternal Grandmother   . Mental illness Paternal Grandmother   . Stroke Paternal Grandmother   . Cancer Paternal Grandmother        brain cancer  . Bipolar disorder Paternal Grandmother   . Stroke  Maternal Grandfather   . Cancer Maternal Grandfather        Bladder, Prostate/bladder/prostate  . Heart disease Paternal Grandfather   . Heart attack Paternal Grandfather   . Macular degeneration Unknown   . Diabetes Brother   . Breast cancer Maternal Aunt     Past medical history, surgical history, medications, allergies, family history and social history reviewed with patient today and changes made to appropriate areas of the chart.   Review of Systems  Constitutional: Negative.   HENT: Negative.   Eyes: Negative.   Respiratory: Positive for cough, shortness of breath and wheezing. Negative for hemoptysis and sputum production.        With the humidity- better with the inhaler   Cardiovascular: Positive for leg swelling. Negative for chest pain, palpitations, orthopnea, claudication and PND.  Gastrointestinal: Positive for constipation. Negative for abdominal pain, blood in stool, diarrhea, heartburn, melena, nausea and vomiting.  Genitourinary: Positive for dysuria. Negative for flank pain, frequency, hematuria and urgency.  Musculoskeletal: Negative.        R knee pain- feels really tight  Skin: Negative.   Neurological: Positive for headaches. Negative for dizziness, tingling, tremors, sensory change, speech change, focal weakness, seizures and loss of consciousness.  Endo/Heme/Allergies: Positive for polydipsia. Negative for environmental allergies. Bruises/bleeds easily.  Psychiatric/Behavioral: Negative.     All other ROS negative except what is listed above and in the HPI.      Objective:    BP 126/87 (BP Location: Left Arm, Patient Position: Sitting, Cuff Size: Large)   Pulse 78   Temp 98.7 F (37.1 C)   Ht 5' 4.4" (1.636 m)   Wt 242 lb 6 oz (109.9 kg)   LMP  07/01/2013 Comment: tubal  SpO2 96%   BMI 41.09 kg/m   Wt Readings from Last 3 Encounters:  12/13/16 242 lb 6 oz (109.9 kg)  11/08/16 240 lb 9 oz (109.1 kg)  10/06/16 239 lb 11.2 oz (108.7 kg)     Physical Exam  Constitutional: She is oriented to person, place, and time. She appears well-developed and well-nourished. No distress.  HENT:  Head: Normocephalic and atraumatic.  Right Ear: Hearing, tympanic membrane, external ear and ear canal normal. No decreased hearing is noted.  Left Ear: Hearing, tympanic membrane, external ear and ear canal normal.  Nose: Nose normal.  Mouth/Throat: Uvula is midline, oropharynx is clear and moist and mucous membranes are normal. No oropharyngeal exudate.  Eyes: Pupils are equal, round, and reactive to light. Conjunctivae, EOM and lids are normal. Right eye exhibits no discharge. Left eye exhibits no discharge. No scleral icterus.  Neck: Normal range of motion. Neck supple. No JVD present. No tracheal deviation present. No thyromegaly present.  Cardiovascular: Normal rate, regular rhythm, normal heart sounds and intact distal pulses.  Exam reveals no gallop and no friction rub.   No murmur heard. Pulmonary/Chest: Effort normal and breath sounds normal. No stridor. No respiratory distress. She has no wheezes. She has no rales. She exhibits no tenderness. Right breast exhibits no inverted nipple, no mass, no nipple discharge, no skin change and no tenderness. Left breast exhibits no inverted nipple, no mass, no nipple discharge, no skin change and no tenderness. Breasts are symmetrical.  Abdominal: Soft. Bowel sounds are normal. She exhibits no distension and no mass. There is no tenderness. There is no rebound and no guarding.  Genitourinary: Uterus normal. Rectal exam shows guaiac negative stool. Vaginal discharge found.  Musculoskeletal: Normal range of motion. She exhibits no edema, tenderness or deformity.  Lymphadenopathy:    She has no cervical adenopathy.  Neurological: She is alert and oriented to person, place, and time. She has normal reflexes. She displays normal reflexes. No cranial nerve deficit. She exhibits normal muscle tone. Coordination  normal.  Skin: Skin is warm, dry and intact. No rash noted. She is not diaphoretic. No erythema. No pallor.  Psychiatric: She has a normal mood and affect. Her speech is normal and behavior is normal. Judgment and thought content normal. Cognition and memory are normal.  Nursing note and vitals reviewed.   Results for orders placed or performed during the hospital encounter of 08/18/16  Urine culture  Result Value Ref Range   Specimen Description URINE, RANDOM    Special Requests NONE    Culture MULTIPLE SPECIES PRESENT, SUGGEST RECOLLECTION (A)    Report Status 08/19/2016 FINAL   Basic metabolic panel  Result Value Ref Range   Sodium 138 135 - 145 mmol/L   Potassium 3.6 3.5 - 5.1 mmol/L   Chloride 107 101 - 111 mmol/L   CO2 23 22 - 32 mmol/L   Glucose, Bld 91 65 - 99 mg/dL   BUN 14 6 - 20 mg/dL   Creatinine, Ser 0.91 0.44 - 1.00 mg/dL   Calcium 9.0 8.9 - 10.3 mg/dL   GFR calc non Af Amer >60 >60 mL/min   GFR calc Af Amer >60 >60 mL/min   Anion gap 8 5 - 15  CBC  Result Value Ref Range   WBC 5.7 3.6 - 11.0 K/uL   RBC 4.67 3.80 - 5.20 MIL/uL   Hemoglobin 14.1 12.0 - 16.0 g/dL   HCT 41.3 35.0 - 47.0 %   MCV  88.4 80.0 - 100.0 fL   MCH 30.3 26.0 - 34.0 pg   MCHC 34.2 32.0 - 36.0 g/dL   RDW 14.1 11.5 - 14.5 %   Platelets 216 150 - 440 K/uL  Urinalysis, Complete w Microscopic  Result Value Ref Range   Color, Urine YELLOW (A) YELLOW   APPearance CLOUDY (A) CLEAR   Specific Gravity, Urine 1.008 1.005 - 1.030   pH 5.0 5.0 - 8.0   Glucose, UA NEGATIVE NEGATIVE mg/dL   Hgb urine dipstick NEGATIVE NEGATIVE   Bilirubin Urine NEGATIVE NEGATIVE   Ketones, ur NEGATIVE NEGATIVE mg/dL   Protein, ur NEGATIVE NEGATIVE mg/dL   Nitrite NEGATIVE NEGATIVE   Leukocytes, UA LARGE (A) NEGATIVE   RBC / HPF TOO NUMEROUS TO COUNT 0 - 5 RBC/hpf   WBC, UA 6-30 0 - 5 WBC/hpf   Bacteria, UA FEW (A) NONE SEEN   Squamous Epithelial / LPF NONE SEEN NONE SEEN   Mucous PRESENT   Hepatic function  panel  Result Value Ref Range   Total Protein 7.4 6.5 - 8.1 g/dL   Albumin 4.5 3.5 - 5.0 g/dL   AST 18 15 - 41 U/L   ALT 13 (L) 14 - 54 U/L   Alkaline Phosphatase 44 38 - 126 U/L   Total Bilirubin 0.5 0.3 - 1.2 mg/dL   Bilirubin, Direct 0.1 0.1 - 0.5 mg/dL   Indirect Bilirubin 0.4 0.3 - 0.9 mg/dL  Lipase, blood  Result Value Ref Range   Lipase 16 11 - 51 U/L  Troponin I  Result Value Ref Range   Troponin I <0.03 <0.03 ng/mL  TSH  Result Value Ref Range   TSH 0.860 0.350 - 4.500 uIU/mL  Brain natriuretic peptide  Result Value Ref Range   B Natriuretic Peptide 45.0 0.0 - 100.0 pg/mL  Fibrin derivatives D-Dimer ([facility] only)  Result Value Ref Range   Fibrin derivatives D-dimer ([facility]) 333.98 0.00 - 499.00      Assessment & Plan:   Problem List Items Addressed This Visit      Other   Depression    Under good control. Continue current regimen. Continue to monitor. Call with any concerns. Refills given today.      Relevant Medications   DULoxetine (CYMBALTA) 60 MG capsule   buPROPion (WELLBUTRIN SR) 150 MG 12 hr tablet   busPIRone (BUSPAR) 15 MG tablet    Other Visit Diagnoses    Routine general medical examination at a health care facility    -  Primary   Vaccines up to date. Screening labs checked today. Pap done. Mammogram and colonoscopy ordered. Continue diet and exercise. Call with any concerns.    Relevant Orders   CBC with Differential/Platelet   Comprehensive metabolic panel   Lipid Panel w/o Chol/HDL Ratio   Microalbumin, Urine Waived   TSH   UA/M w/rflx Culture, Routine   Encounter for screening colonoscopy       Referral generated today.   Relevant Orders   Ambulatory referral to Gastroenterology   Screening for cervical cancer       Pap done today.   Relevant Orders   IGP, Aptima HPV, rfx 16/18,45   Screening for breast cancer       Mammogram orderes today   Relevant Orders   MM DIGITAL SCREENING BILATERAL       Follow up plan: Return in about 3  months (around 03/15/2017) for Follow up pain/BP/migraines/OSA/GERD/Thyroid.  LABORATORY TESTING:  - Pap smear: pap done  IMMUNIZATIONS:   -  Tdap: Tetanus vaccination status reviewed: last tetanus booster within 10 years. - Influenza: Up to date - Pneumovax: Up to date - Prevnar: Not applicable - Zostavax vaccine: Will check with her insurance  SCREENING: -Mammogram: Ordered today  - Colonoscopy: Ordered today  - Bone Density: Not applicable   PATIENT COUNSELING:   Advised to take 1 mg of folate supplement per day if capable of pregnancy.   Sexuality: Discussed sexually transmitted diseases, partner selection, use of condoms, avoidance of unintended pregnancy  and contraceptive alternatives.   Advised to avoid cigarette smoking.  I discussed with the patient that most people either abstain from alcohol or drink within safe limits (<=14/week and <=4 drinks/occasion for males, <=7/weeks and <= 3 drinks/occasion for females) and that the risk for alcohol disorders and other health effects rises proportionally with the number of drinks per week and how often a drinker exceeds daily limits.  Discussed cessation/primary prevention of drug use and availability of treatment for abuse.   Diet: Encouraged to adjust caloric intake to maintain  or achieve ideal body weight, to reduce intake of dietary saturated fat and total fat, to limit sodium intake by avoiding high sodium foods and not adding table salt, and to maintain adequate dietary potassium and calcium preferably from fresh fruits, vegetables, and low-fat dairy products.    stressed the importance of regular exercise  Injury prevention: Discussed safety belts, safety helmets, smoke detector, smoking near bedding or upholstery.   Dental health: Discussed importance of regular tooth brushing, flossing, and dental visits.    NEXT PREVENTATIVE PHYSICAL DUE IN 1 YEAR. Return in about 3 months (around 03/15/2017) for Follow up  pain/BP/migraines/OSA/GERD/Thyroid.

## 2016-12-13 NOTE — Patient Instructions (Addendum)
Knee Exercises Ask your health care provider which exercises are safe for you. Do exercises exactly as told by your health care provider and adjust them as directed. It is normal to feel mild stretching, pulling, tightness, or discomfort as you do these exercises, but you should stop right away if you feel sudden pain or your pain gets worse.Do not begin these exercises until told by your health care provider. STRETCHING AND RANGE OF MOTION EXERCISES These exercises warm up your muscles and joints and improve the movement and flexibility of your knee. These exercises also help to relieve pain, numbness, and tingling. Exercise A: Knee Extension, Prone 1. Lie on your abdomen on a bed. 2. Place your left / right knee just beyond the edge of the surface so your knee is not on the bed. You can put a towel under your left / right thigh just above your knee for comfort. 3. Relax your leg muscles and allow gravity to straighten your knee. You should feel a stretch behind your left / right knee. 4. Hold this position for __________ seconds. 5. Scoot up so your knee is supported between repetitions. Repeat __________ times. Complete this stretch __________ times a day. Exercise B: Knee Flexion, Active  1. Lie on your back with both knees straight. If this causes back discomfort, bend your left / right knee so your foot is flat on the floor. 2. Slowly slide your left / right heel back toward your buttocks until you feel a gentle stretch in the front of your knee or thigh. 3. Hold this position for __________ seconds. 4. Slowly slide your left / right heel back to the starting position. Repeat __________ times. Complete this exercise __________ times a day. Exercise C: Quadriceps, Prone  1. Lie on your abdomen on a firm surface, such as a bed or padded floor. 2. Bend your left / right knee and hold your ankle. If you cannot reach your ankle or pant leg, loop a belt around your foot and grab the belt  instead. 3. Gently pull your heel toward your buttocks. Your knee should not slide out to the side. You should feel a stretch in the front of your thigh and knee. 4. Hold this position for __________ seconds. Repeat __________ times. Complete this stretch __________ times a day. Exercise D: Hamstring, Supine 1. Lie on your back. 2. Loop a belt or towel over the ball of your left / right foot. The ball of your foot is on the walking surface, right under your toes. 3. Straighten your left / right knee and slowly pull on the belt to raise your leg until you feel a gentle stretch behind your knee. ? Do not let your left / right knee bend while you do this. ? Keep your other leg flat on the floor. 4. Hold this position for __________ seconds. Repeat __________ times. Complete this stretch __________ times a day. STRENGTHENING EXERCISES These exercises build strength and endurance in your knee. Endurance is the ability to use your muscles for a long time, even after they get tired. Exercise E: Quadriceps, Isometric  1. Lie on your back with your left / right leg extended and your other knee bent. Put a rolled towel or small pillow under your knee if told by your health care provider. 2. Slowly tense the muscles in the front of your left / right thigh. You should see your kneecap slide up toward your hip or see increased dimpling just above the knee. This  motion will push the back of the knee toward the floor. 3. For __________ seconds, keep the muscle as tight as you can without increasing your pain. 4. Relax the muscles slowly and completely. Repeat __________ times. Complete this exercise __________ times a day. Exercise F: Straight Leg Raises - Quadriceps 1. Lie on your back with your left / right leg extended and your other knee bent. 2. Tense the muscles in the front of your left / right thigh. You should see your kneecap slide up or see increased dimpling just above the knee. Your thigh may  even shake a bit. 3. Keep these muscles tight as you raise your leg 4-6 inches (10-15 cm) off the floor. Do not let your knee bend. 4. Hold this position for __________ seconds. 5. Keep these muscles tense as you lower your leg. 6. Relax your muscles slowly and completely after each repetition. Repeat __________ times. Complete this exercise __________ times a day. Exercise G: Hamstring, Isometric 1. Lie on your back on a firm surface. 2. Bend your left / right knee approximately __________ degrees. 3. Dig your left / right heel into the surface as if you are trying to pull it toward your buttocks. Tighten the muscles in the back of your thighs to dig as hard as you can without increasing any pain. 4. Hold this position for __________ seconds. 5. Release the tension gradually and allow your muscles to relax completely for __________ seconds after each repetition. Repeat __________ times. Complete this exercise __________ times a day. Exercise H: Hamstring Curls  If told by your health care provider, do this exercise while wearing ankle weights. Begin with __________ weights. Then increase the weight by 1 lb (0.5 kg) increments. Do not wear ankle weights that are more than __________. 1. Lie on your abdomen with your legs straight. 2. Bend your left / right knee as far as you can without feeling pain. Keep your hips flat against the floor. 3. Hold this position for __________ seconds. 4. Slowly lower your leg to the starting position.  Repeat __________ times. Complete this exercise __________ times a day. Exercise I: Squats (Quadriceps) 1. Stand in front of a table, with your feet and knees pointing straight ahead. You may rest your hands on the table for balance but not for support. 2. Slowly bend your knees and lower your hips like you are going to sit in a chair. ? Keep your weight over your heels, not over your toes. ? Keep your lower legs upright so they are parallel with the table  legs. ? Do not let your hips go lower than your knees. ? Do not bend lower than told by your health care provider. ? If your knee pain increases, do not bend as low. 3. Hold the squat position for __________ seconds. 4. Slowly push with your legs to return to standing. Do not use your hands to pull yourself to standing. Repeat __________ times. Complete this exercise __________ times a day. Exercise J: Wall Slides (Quadriceps)  1. Lean your back against a smooth wall or door while you walk your feet out 18-24 inches (46-61 cm) from it. 2. Place your feet hip-width apart. 3. Slowly slide down the wall or door until your knees bend __________ degrees. Keep your knees over your heels, not over your toes. Keep your knees in line with your hips. 4. Hold for __________ seconds. Repeat __________ times. Complete this exercise __________ times a day. Exercise K: Straight Leg Raises -  Hip Abductors 1. Lie on your side with your left / right leg in the top position. Lie so your head, shoulder, knee, and hip line up. You may bend your bottom knee to help you keep your balance. 2. Roll your hips slightly forward so your hips are stacked directly over each other and your left / right knee is facing forward. 3. Leading with your heel, lift your top leg 4-6 inches (10-15 cm). You should feel the muscles in your outer hip lifting. ? Do not let your foot drift forward. ? Do not let your knee roll toward the ceiling. 4. Hold this position for __________ seconds. 5. Slowly return your leg to the starting position. 6. Let your muscles relax completely after each repetition. Repeat __________ times. Complete this exercise __________ times a day. Exercise L: Straight Leg Raises - Hip Extensors 1. Lie on your abdomen on a firm surface. You can put a pillow under your hips if that is more comfortable. 2. Tense the muscles in your buttocks and lift your left / right leg about 4-6 inches (10-15 cm). Keep your knee  straight as you lift your leg. 3. Hold this position for __________ seconds. 4. Slowly lower your leg to the starting position. 5. Let your leg relax completely after each repetition. Repeat __________ times. Complete this exercise __________ times a day. This information is not intended to replace advice given to you by your health care provider. Make sure you discuss any questions you have with your health care provider. Document Released: 03/30/2005 Document Revised: 02/08/2016 Document Reviewed: 03/22/2015 Elsevier Interactive Patient Education  2018 [place] Maintenance, Female Adopting a healthy lifestyle and getting preventive care can go a long way to promote health and wellness. Talk with your health care provider about what schedule of regular examinations is right for you. This is a good chance for you to check in with your provider about disease prevention and staying healthy. In between checkups, there are plenty of things you can do on your own. Experts have done a lot of research about which lifestyle changes and preventive measures are most likely to keep you healthy. Ask your health care provider for more information. Weight and diet Eat a healthy diet  Be sure to include plenty of vegetables, fruits, low-fat dairy products, and lean protein.  Do not eat a lot of foods high in solid fats, added sugars, or salt.  Get regular exercise. This is one of the most important things you can do for your health. ? Most adults should exercise for at least 150 minutes each week. The exercise should increase your heart rate and make you sweat (moderate-intensity exercise). ? Most adults should also do strengthening exercises at least twice a week. This is in addition to the moderate-intensity exercise.  Maintain a healthy weight  Body mass index (BMI) is a measurement that can be used to identify possible weight problems. It estimates body fat based on height and weight. Your  health care provider can help determine your BMI and help you achieve or maintain a healthy weight.  For females 30 years of age and older: ? A BMI below 18.5 is considered underweight. ? A BMI of 18.5 to 24.9 is normal. ? A BMI of 25 to 29.9 is considered overweight. ? A BMI of 30 and above is considered obese.  Watch levels of cholesterol and blood lipids  You should start having your blood tested for lipids and cholesterol at 53 years of  age, then have this test every 5 years.  You may need to have your cholesterol levels checked more often if: ? Your lipid or cholesterol levels are high. ? You are older than 53 years of age. ? You are at high risk for heart disease.  Cancer screening Lung Cancer  Lung cancer screening is recommended for adults 72-42 years old who are at high risk for lung cancer because of a history of smoking.  A yearly low-dose CT scan of the lungs is recommended for people who: ? Currently smoke. ? Have quit within the past 15 years. ? Have at least a 30-pack-year history of smoking. A pack year is smoking an average of one pack of cigarettes a day for 1 year.  Yearly screening should continue until it has been 15 years since you quit.  Yearly screening should stop if you develop a health problem that would prevent you from having lung cancer treatment.  Breast Cancer  Practice breast self-awareness. This means understanding how your breasts normally appear and feel.  It also means doing regular breast self-exams. Let your health care provider know about any changes, no matter how small.  If you are in your 20s or 30s, you should have a clinical breast exam (CBE) by a health care provider every 1-3 years as part of a regular health exam.  If you are 49 or older, have a CBE every year. Also consider having a breast X-ray (mammogram) every year.  If you have a family history of breast cancer, talk to your health care provider about genetic  screening.  If you are at high risk for breast cancer, talk to your health care provider about having an MRI and a mammogram every year.  Breast cancer gene (BRCA) assessment is recommended for women who have family members with BRCA-related cancers. BRCA-related cancers include: ? Breast. ? Ovarian. ? Tubal. ? Peritoneal cancers.  Results of the assessment will determine the need for genetic counseling and BRCA1 and BRCA2 testing.  Cervical Cancer Your health care provider may recommend that you be screened regularly for cancer of the pelvic organs (ovaries, uterus, and vagina). This screening involves a pelvic examination, including checking for microscopic changes to the surface of your cervix (Pap test). You may be encouraged to have this screening done every 3 years, beginning at age 71.  For women ages 70-65, health care providers may recommend pelvic exams and Pap testing every 3 years, or they may recommend the Pap and pelvic exam, combined with testing for human papilloma virus (HPV), every 5 years. Some types of HPV increase your risk of cervical cancer. Testing for HPV may also be done on women of any age with unclear Pap test results.  Other health care providers may not recommend any screening for nonpregnant women who are considered low risk for pelvic cancer and who do not have symptoms. Ask your health care provider if a screening pelvic exam is right for you.  If you have had past treatment for cervical cancer or a condition that could lead to cancer, you need Pap tests and screening for cancer for at least 20 years after your treatment. If Pap tests have been discontinued, your risk factors (such as having a new sexual partner) need to be reassessed to determine if screening should resume. Some women have medical problems that increase the chance of getting cervical cancer. In these cases, your health care provider may recommend more frequent screening and Pap  tests.  Colorectal  Cancer  This type of cancer can be detected and often prevented.  Routine colorectal cancer screening usually begins at 53 years of age and continues through 53 years of age.  Your health care provider may recommend screening at an earlier age if you have risk factors for colon cancer.  Your health care provider may also recommend using home test kits to check for hidden blood in the stool.  A small camera at the end of a tube can be used to examine your colon directly (sigmoidoscopy or colonoscopy). This is done to check for the earliest forms of colorectal cancer.  Routine screening usually begins at age 54.  Direct examination of the colon should be repeated every 5-10 years through 53 years of age. However, you may need to be screened more often if early forms of precancerous polyps or small growths are found.  Skin Cancer  Check your skin from head to toe regularly.  Tell your health care provider about any new moles or changes in moles, especially if there is a change in a mole's shape or color.  Also tell your health care provider if you have a mole that is larger than the size of a pencil eraser.  Always use sunscreen. Apply sunscreen liberally and repeatedly throughout the day.  Protect yourself by wearing long sleeves, pants, a wide-brimmed hat, and sunglasses whenever you are outside.  Heart disease, diabetes, and high blood pressure  High blood pressure causes heart disease and increases the risk of stroke. High blood pressure is more likely to develop in: ? People who have blood pressure in the high end of the normal range (130-139/85-89 mm Hg). ? People who are overweight or obese. ? People who are African American.  If you are 39-42 years of age, have your blood pressure checked every 3-5 years. If you are 57 years of age or older, have your blood pressure checked every year. You should have your blood pressure measured twice-once when you are at  a hospital or clinic, and once when you are not at a hospital or clinic. Record the average of the two measurements. To check your blood pressure when you are not at a hospital or clinic, you can use: ? An automated blood pressure machine at a pharmacy. ? A home blood pressure monitor.  If you are between 8 years and 53 years old, ask your health care provider if you should take aspirin to prevent strokes.  Have regular diabetes screenings. This involves taking a blood sample to check your fasting blood sugar level. ? If you are at a normal weight and have a low risk for diabetes, have this test once every three years after 53 years of age. ? If you are overweight and have a high risk for diabetes, consider being tested at a younger age or more often. Preventing infection Hepatitis B  If you have a higher risk for hepatitis B, you should be screened for this virus. You are considered at high risk for hepatitis B if: ? You were born in a country where hepatitis B is common. Ask your health care provider which countries are considered high risk. ? Your parents were born in a high-risk country, and you have not been immunized against hepatitis B (hepatitis B vaccine). ? You have HIV or AIDS. ? You use needles to inject street drugs. ? You live with someone who has hepatitis B. ? You have had sex with someone who has hepatitis B. ? You get  hemodialysis treatment. ? You take certain medicines for conditions, including cancer, organ transplantation, and autoimmune conditions.  Hepatitis C  Blood testing is recommended for: ? Everyone born from [birth year] through 1967. ? Anyone with known risk factors for hepatitis C.  Sexually transmitted infections (STIs)  You should be screened for sexually transmitted infections (STIs) including gonorrhea and chlamydia if: ? You are sexually active and are younger than 53 years of age. ? You are older than 53 years of age and your health care provider tells  you that you are at risk for this type of infection. ? Your sexual activity has changed since you were last screened and you are at an increased risk for chlamydia or gonorrhea. Ask your health care provider if you are at risk.  If you do not have HIV, but are at risk, it may be recommended that you take a prescription medicine daily to prevent HIV infection. This is called pre-exposure prophylaxis (PrEP). You are considered at risk if: ? You are sexually active and do not regularly use condoms or know the HIV status of your partner(s). ? You take drugs by injection. ? You are sexually active with a partner who has HIV.  Talk with your health care provider about whether you are at high risk of being infected with HIV. If you choose to begin PrEP, you should first be tested for HIV. You should then be tested every 3 months for as long as you are taking PrEP. Pregnancy  If you are premenopausal and you may become pregnant, ask your health care provider about preconception counseling.  If you may become pregnant, take 400 to 800 micrograms (mcg) of folic acid every day.  If you want to prevent pregnancy, talk to your health care provider about birth control (contraception). Osteoporosis and menopause  Osteoporosis is a disease in which the bones lose minerals and strength with aging. This can result in serious bone fractures. Your risk for osteoporosis can be identified using a bone density scan.  If you are 62 years of age or older, or if you are at risk for osteoporosis and fractures, ask your health care provider if you should be screened.  Ask your health care provider whether you should take a calcium or vitamin D supplement to lower your risk for osteoporosis.  Menopause may have certain physical symptoms and risks.  Hormone replacement therapy may reduce some of these symptoms and risks. Talk to your health care provider about whether hormone replacement therapy is right for  you. Follow these instructions at home:  Schedule regular health, dental, and eye exams.  Stay current with your immunizations.  Do not use any tobacco products including cigarettes, chewing tobacco, or electronic cigarettes.  If you are pregnant, do not drink alcohol.  If you are breastfeeding, limit how much and how often you drink alcohol.  Limit alcohol intake to no more than 1 drink per day for nonpregnant women. One drink equals 12 ounces of beer, 5 ounces of wine, or 1 ounces of hard liquor.  Do not use street drugs.  Do not share needles.  Ask your health care provider for help if you need support or information about quitting drugs.  Tell your health care provider if you often feel depressed.  Tell your health care provider if you have ever been abused or do not feel safe at home. This information is not intended to replace advice given to you by your health care provider. Make sure you discuss  any questions you have with your health care provider. Document Released: 11/29/2010 Document Revised: 10/22/2015 Document Reviewed: 02/17/2015 Elsevier Interactive Patient Education  2018 [place] Maintenance for Postmenopausal Women Menopause is a normal process in which your reproductive ability comes to an end. This process happens gradually over a span of months to years, usually between the ages of 57 and 107. Menopause is complete when you have missed 12 consecutive menstrual periods. It is important to talk with your health care provider about some of the most common conditions that affect postmenopausal women, such as heart disease, cancer, and bone loss (osteoporosis). Adopting a healthy lifestyle and getting preventive care can help to promote your health and wellness. Those actions can also lower your chances of developing some of these common conditions. What should I know about menopause? During menopause, you may experience a number of symptoms, such  as:  Moderate-to-severe hot flashes.  Night sweats.  Decrease in sex drive.  Mood swings.  Headaches.  Tiredness.  Irritability.  Memory problems.  Insomnia.  Choosing to treat or not to treat menopausal changes is an individual decision that you make with your health care provider. What should I know about hormone replacement therapy and supplements? Hormone therapy products are effective for treating symptoms that are associated with menopause, such as hot flashes and night sweats. Hormone replacement carries certain risks, especially as you become older. If you are thinking about using estrogen or estrogen with progestin treatments, discuss the benefits and risks with your health care provider. What should I know about heart disease and stroke? Heart disease, heart attack, and stroke become more likely as you age. This may be due, in part, to the hormonal changes that your body experiences during menopause. These can affect how your body processes dietary fats, triglycerides, and cholesterol. Heart attack and stroke are both medical emergencies. There are many things that you can do to help prevent heart disease and stroke:  Have your blood pressure checked at least every 1-2 years. High blood pressure causes heart disease and increases the risk of stroke.  If you are 33-75 years old, ask your health care provider if you should take aspirin to prevent a heart attack or a stroke.  Do not use any tobacco products, including cigarettes, chewing tobacco, or electronic cigarettes. If you need help quitting, ask your health care provider.  It is important to eat a healthy diet and maintain a healthy weight. ? Be sure to include plenty of vegetables, fruits, low-fat dairy products, and lean protein. ? Avoid eating foods that are high in solid fats, added sugars, or salt (sodium).  Get regular exercise. This is one of the most important things that you can do for your health. ? Try  to exercise for at least 150 minutes each week. The type of exercise that you do should increase your heart rate and make you sweat. This is known as moderate-intensity exercise. ? Try to do strengthening exercises at least twice each week. Do these in addition to the moderate-intensity exercise.  Know your numbers.Ask your health care provider to check your cholesterol and your blood glucose. Continue to have your blood tested as directed by your health care provider.  What should I know about cancer screening? There are several types of cancer. Take the following steps to reduce your risk and to catch any cancer development as early as possible. Breast Cancer  Practice breast self-awareness. ? This means understanding how your breasts normally appear  and feel. ? It also means doing regular breast self-exams. Let your health care provider know about any changes, no matter how small.  If you are 39 or older, have a clinician do a breast exam (clinical breast exam or CBE) every year. Depending on your age, family history, and medical history, it may be recommended that you also have a yearly breast X-ray (mammogram).  If you have a family history of breast cancer, talk with your health care provider about genetic screening.  If you are at high risk for breast cancer, talk with your health care provider about having an MRI and a mammogram every year.  Breast cancer (BRCA) gene test is recommended for women who have family members with BRCA-related cancers. Results of the assessment will determine the need for genetic counseling and BRCA1 and for BRCA2 testing. BRCA-related cancers include these types: ? Breast. This occurs in males or females. ? Ovarian. ? Tubal. This may also be called fallopian tube cancer. ? Cancer of the abdominal or pelvic lining (peritoneal cancer). ? Prostate. ? Pancreatic.  Cervical, Uterine, and Ovarian Cancer Your health care provider may recommend that you be  screened regularly for cancer of the pelvic organs. These include your ovaries, uterus, and vagina. This screening involves a pelvic exam, which includes checking for microscopic changes to the surface of your cervix (Pap test).  For women ages 21-65, health care providers may recommend a pelvic exam and a Pap test every three years. For women ages 57-65, they may recommend the Pap test and pelvic exam, combined with testing for human papilloma virus (HPV), every five years. Some types of HPV increase your risk of cervical cancer. Testing for HPV may also be done on women of any age who have unclear Pap test results.  Other health care providers may not recommend any screening for nonpregnant women who are considered low risk for pelvic cancer and have no symptoms. Ask your health care provider if a screening pelvic exam is right for you.  If you have had past treatment for cervical cancer or a condition that could lead to cancer, you need Pap tests and screening for cancer for at least 20 years after your treatment. If Pap tests have been discontinued for you, your risk factors (such as having a new sexual partner) need to be reassessed to determine if you should start having screenings again. Some women have medical problems that increase the chance of getting cervical cancer. In these cases, your health care provider may recommend that you have screening and Pap tests more often.  If you have a family history of uterine cancer or ovarian cancer, talk with your health care provider about genetic screening.  If you have vaginal bleeding after reaching menopause, tell your health care provider.  There are currently no reliable tests available to screen for ovarian cancer.  Lung Cancer Lung cancer screening is recommended for adults 73-31 years old who are at high risk for lung cancer because of a history of smoking. A yearly low-dose CT scan of the lungs is recommended if you:  Currently  smoke.  Have a history of at least 30 pack-years of smoking and you currently smoke or have quit within the past 15 years. A pack-year is smoking an average of one pack of cigarettes per day for one year.  Yearly screening should:  Continue until it has been 15 years since you quit.  Stop if you develop a health problem that would prevent you from  having lung cancer treatment.  Colorectal Cancer  This type of cancer can be detected and can often be prevented.  Routine colorectal cancer screening usually begins at age 57 and continues through age 10.  If you have risk factors for colon cancer, your health care provider may recommend that you be screened at an earlier age.  If you have a family history of colorectal cancer, talk with your health care provider about genetic screening.  Your health care provider may also recommend using home test kits to check for hidden blood in your stool.  A small camera at the end of a tube can be used to examine your colon directly (sigmoidoscopy or colonoscopy). This is done to check for the earliest forms of colorectal cancer.  Direct examination of the colon should be repeated every 5-10 years until age 70. However, if early forms of precancerous polyps or small growths are found or if you have a family history or genetic risk for colorectal cancer, you may need to be screened more often.  Skin Cancer  Check your skin from head to toe regularly.  Monitor any moles. Be sure to tell your health care provider: ? About any new moles or changes in moles, especially if there is a change in a mole's shape or color. ? If you have a mole that is larger than the size of a pencil eraser.  If any of your family members has a history of skin cancer, especially at a young age, talk with your health care provider about genetic screening.  Always use sunscreen. Apply sunscreen liberally and repeatedly throughout the day.  Whenever you are outside, protect  yourself by wearing long sleeves, pants, a wide-brimmed hat, and sunglasses.  What should I know about osteoporosis? Osteoporosis is a condition in which bone destruction happens more quickly than new bone creation. After menopause, you may be at an increased risk for osteoporosis. To help prevent osteoporosis or the bone fractures that can happen because of osteoporosis, the following is recommended:  If you are 14-64 years old, get at least 1,000 mg of calcium and at least 600 mg of vitamin D per day.  If you are older than age 24 but younger than age 34, get at least 1,200 mg of calcium and at least 600 mg of vitamin D per day.  If you are older than age 1, get at least 1,200 mg of calcium and at least 800 mg of vitamin D per day.  Smoking and excessive alcohol intake increase the risk of osteoporosis. Eat foods that are rich in calcium and vitamin D, and do weight-bearing exercises several times each week as directed by your health care provider. What should I know about how menopause affects my mental health? Depression may occur at any age, but it is more common as you become older. Common symptoms of depression include:  Low or sad mood.  Changes in sleep patterns.  Changes in appetite or eating patterns.  Feeling an overall lack of motivation or enjoyment of activities that you previously enjoyed.  Frequent crying spells.  Talk with your health care provider if you think that you are experiencing depression. What should I know about immunizations? It is important that you get and maintain your immunizations. These include:  Tetanus, diphtheria, and pertussis (Tdap) booster vaccine.  Influenza every year before the flu season begins.  Pneumonia vaccine.  Shingles vaccine.  Your health care provider may also recommend other immunizations. This information is not intended  to replace advice given to you by your health care provider. Make sure you discuss any questions you  have with your health care provider. Document Released: 07/08/2005 Document Revised: 12/04/2015 Document Reviewed: 02/17/2015 Elsevier Interactive Patient Education  2018 [employer].

## 2016-12-14 LAB — COMPREHENSIVE METABOLIC PANEL
A/G RATIO: 1.9 (ref 1.2–2.2)
ALT: 10 IU/L (ref 0–32)
AST: 16 IU/L (ref 0–40)
Albumin: 4.6 g/dL (ref 3.5–5.5)
Alkaline Phosphatase: 61 IU/L (ref 39–117)
BILIRUBIN TOTAL: 0.4 mg/dL (ref 0.0–1.2)
BUN/Creatinine Ratio: 13 (ref 9–23)
BUN: 13 mg/dL (ref 6–24)
CALCIUM: 9.6 mg/dL (ref 8.7–10.2)
CHLORIDE: 99 mmol/L (ref 96–106)
CO2: 25 mmol/L (ref 20–29)
Creatinine, Ser: 1 mg/dL (ref 0.57–1.00)
GFR calc Af Amer: 75 mL/min/{1.73_m2} (ref >59)
GFR, EST NON AFRICAN AMERICAN: 65 mL/min/{1.73_m2} (ref >59)
Globulin, Total: 2.4 g/dL (ref 1.5–4.5)
Glucose: 89 mg/dL (ref 65–99)
POTASSIUM: 4.2 mmol/L (ref 3.5–5.2)
Sodium: 142 mmol/L (ref 134–144)
Total Protein: 7 g/dL (ref 6.0–8.5)

## 2016-12-14 LAB — CBC WITH DIFFERENTIAL/PLATELET
BASOS ABS: 0 10*3/uL (ref 0.0–0.2)
BASOS: 1 %
EOS (ABSOLUTE): 0.1 10*3/uL (ref 0.0–0.4)
Eos: 2 %
Hematocrit: 40.6 % (ref 34.0–46.6)
Hemoglobin: 13.1 g/dL (ref 11.1–15.9)
IMMATURE GRANULOCYTES: 0 %
Immature Grans (Abs): 0 10*3/uL (ref 0.0–0.1)
Lymphocytes Absolute: 1.5 10*3/uL (ref 0.7–3.1)
Lymphs: 28 %
MCH: 29.9 pg (ref 26.6–33.0)
MCHC: 32.3 g/dL (ref 31.5–35.7)
MCV: 93 fL (ref 79–97)
MONOS ABS: 0.5 10*3/uL (ref 0.1–0.9)
Monocytes: 9 %
NEUTROS PCT: 60 %
Neutrophils Absolute: 3.2 10*3/uL (ref 1.4–7.0)
PLATELETS: 207 10*3/uL (ref 150–379)
RBC: 4.38 x10E6/uL (ref 3.77–5.28)
RDW: 13.7 % (ref 12.3–15.4)
WBC: 5.3 10*3/uL (ref 3.4–10.8)

## 2016-12-14 LAB — LIPID PANEL W/O CHOL/HDL RATIO
Cholesterol, Total: 285 mg/dL — ABNORMAL HIGH (ref 100–199)
HDL: 56 mg/dL (ref >39)
LDL Calculated: 208 mg/dL — ABNORMAL HIGH (ref 0–99)
Triglycerides: 105 mg/dL (ref 0–149)
VLDL Cholesterol Cal: 21 mg/dL (ref 5–40)

## 2016-12-14 LAB — TSH: TSH: 0.575 u[IU]/mL (ref 0.450–4.500)

## 2016-12-15 ENCOUNTER — Telehealth: Payer: Self-pay | Admitting: Family Medicine

## 2016-12-15 NOTE — Telephone Encounter (Signed)
Pt called and stated she had been cutting carpet and thinks the dye may have made her sick. She stated that the side of her face(side not specified) was drooping and she had just used a epipen. After speaking with Dr Wynetta Emery I advised the pt that she would need to go to the emergency room. 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 I again expressed to the pt that it was highly suggested that she needed go to the emergency room. She repeated to me her reason for not going because she wouldn't make it to open house and stated she would call us back if she needed [place].

## 2016-12-15 NOTE — Telephone Encounter (Signed)
Sending to provider

## 2016-12-16 ENCOUNTER — Telehealth: Payer: Self-pay | Admitting: Family Medicine

## 2016-12-16 DIAGNOSIS — R87610 Atypical squamous cells of undetermined significance on cytologic smear of cervix (ASC-US): Secondary | ICD-10-CM

## 2016-12-16 DIAGNOSIS — R87810 Cervical high risk human papillomavirus (HPV) DNA test positive: Principal | ICD-10-CM

## 2016-12-16 LAB — IGP, APTIMA HPV, RFX 16/18,45
HPV Aptima: POSITIVE — AB
PAP Smear Comment: 0

## 2016-12-16 NOTE — Telephone Encounter (Signed)
Routing to provider  

## 2016-12-16 NOTE — Telephone Encounter (Signed)
Called and [facility] for her to call back. Her pap showed some changes and HPV still positive. Will need to see GYN- mychart message sent.

## 2016-12-18 ENCOUNTER — Encounter: Payer: Self-pay | Admitting: Family Medicine

## 2016-12-19 ENCOUNTER — Other Ambulatory Visit: Payer: Self-pay | Admitting: Family Medicine

## 2016-12-19 DIAGNOSIS — G8929 Other chronic pain: Secondary | ICD-10-CM

## 2016-12-19 DIAGNOSIS — M25569 Pain in unspecified knee: Secondary | ICD-10-CM

## 2016-12-19 DIAGNOSIS — M25469 Effusion, unspecified knee: Secondary | ICD-10-CM

## 2016-12-22 LAB — UA/M W/RFLX CULTURE, ROUTINE
BILIRUBIN UA: NEGATIVE
GLUCOSE, UA: NEGATIVE
KETONES UA: NEGATIVE
Leukocytes, UA: NEGATIVE
NITRITE UA: NEGATIVE
PROTEIN UA: NEGATIVE
RBC UA: NEGATIVE
SPEC GRAV UA: 1.015 (ref 1.005–1.030)
UUROB: 1 mg/dL (ref 0.2–1.0)
pH, UA: 6.5 (ref 5.0–7.5)

## 2016-12-22 LAB — MICROSCOPIC EXAMINATION: Yeast, UA: NONE SEEN

## 2016-12-22 LAB — MICROALBUMIN, URINE WAIVED
CREATININE, URINE WAIVED: 100 mg/dL (ref 10–300)
Microalb, Ur Waived: 10 mg/L (ref 0–19)

## 2017-01-05 IMAGING — CT CT ANGIO CHEST
2 of 6 series · 18 of 46 positions shown · IV contrast (APPLIED)
Comparison: 03/10/2016 chest radiograph.

CLINICAL DATA: 49 y/o F; bilateral lower leg edema and shortness of
breath on exertion.

EXAM:
CT ANGIOGRAPHY CHEST WITH CONTRAST
TECHNIQUE: Multidetector CT imaging of the chest was performed using the
standard protocol during bolus administration of intravenous
contrast. Multiplanar CT image reconstructions and MIPs were
obtained to evaluate the vascular anatomy.
CONTRAST:  75 cc Isovue 370.

[Series 5: thins · axial · 0.84mm/px · z∈[+210,+447]mm · 16 of 261 slices shown]
[im 12/261  lung]
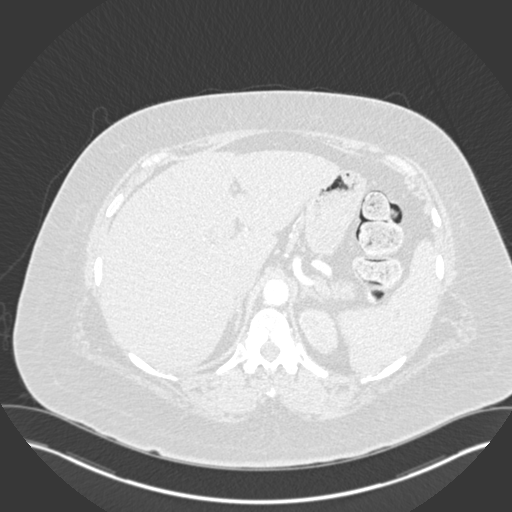
[im 34/261  soft-tissue]
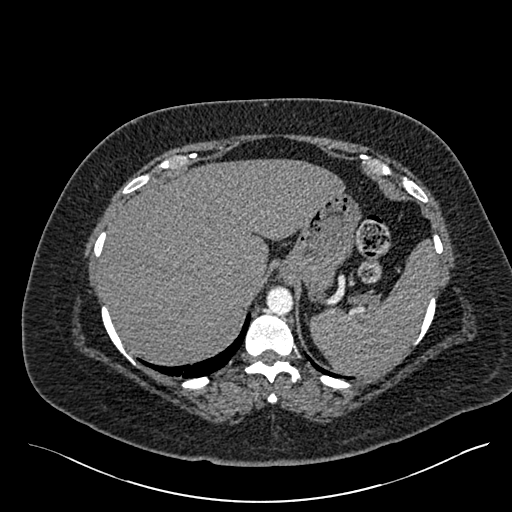
[im 46/261  lung]
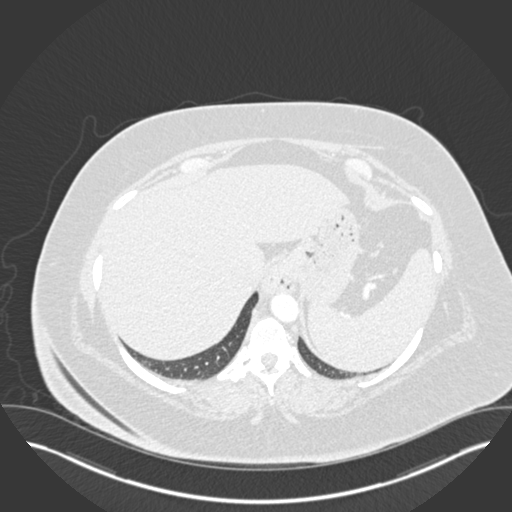
[im 57/261  soft-tissue]
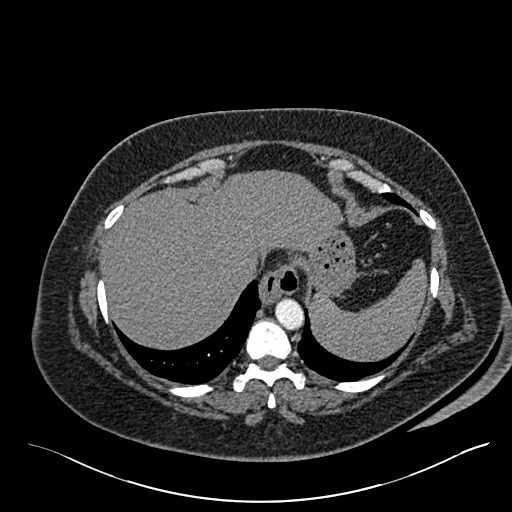
[im 80/261  lung]
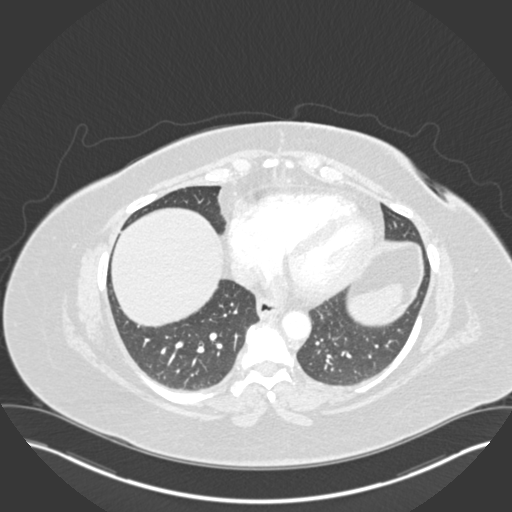
[im 91/261  soft-tissue]
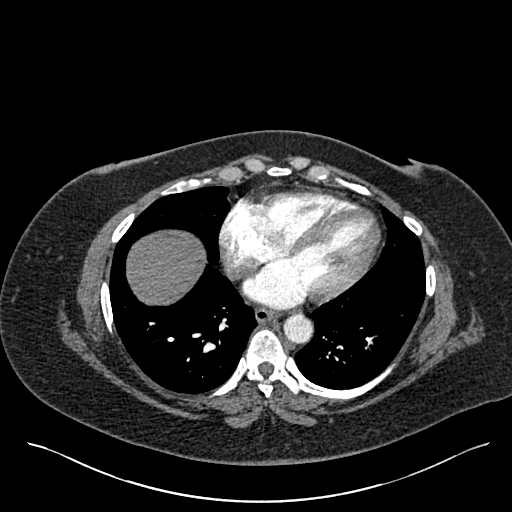
[im 102/261  lung]
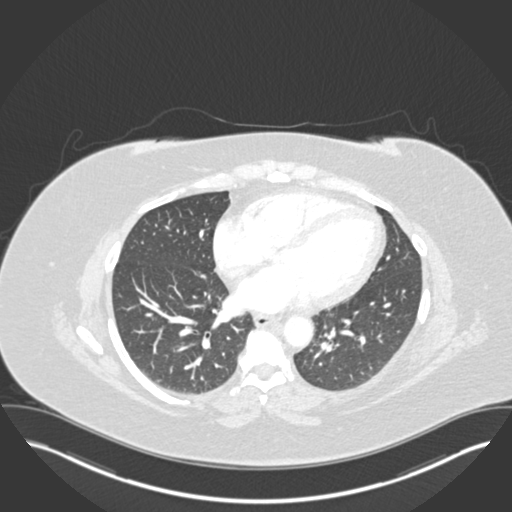
[im 125/261  soft-tissue]
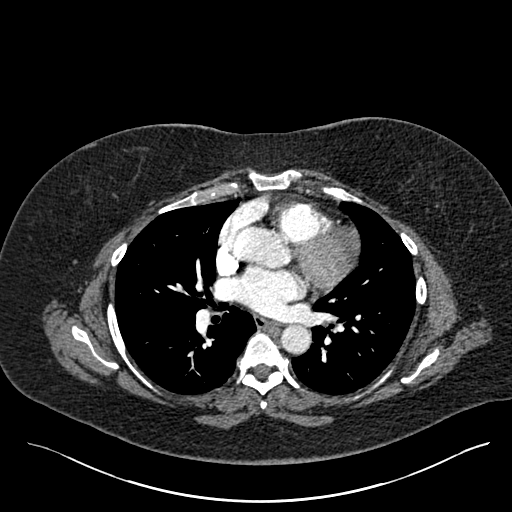
[im 136/261  lung]
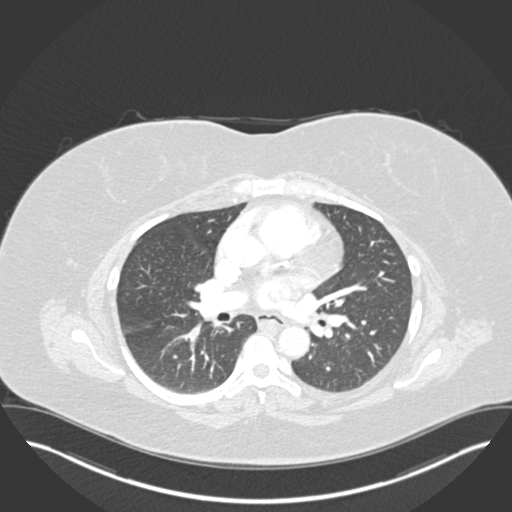
[im 159/261  soft-tissue]
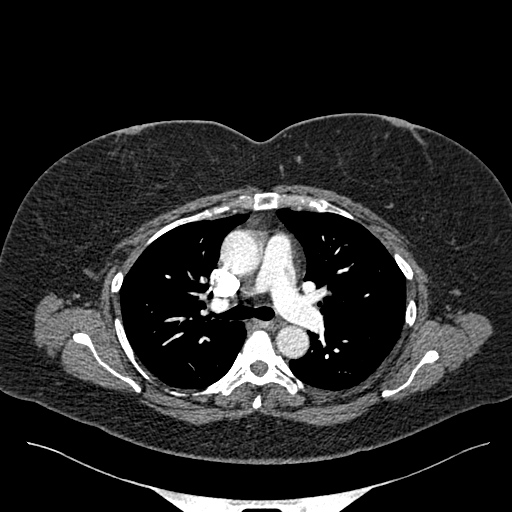
[im 170/261  lung]
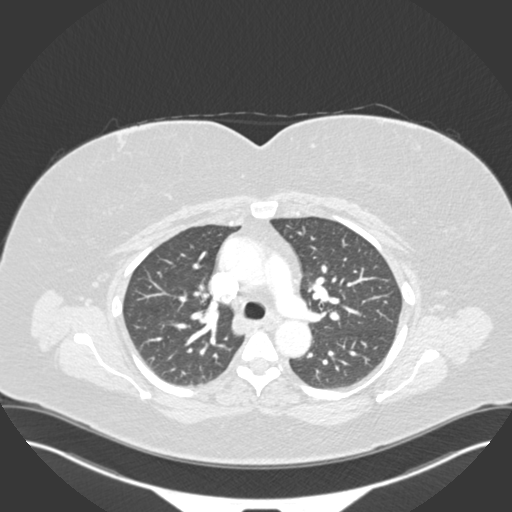
[im 181/261  soft-tissue]
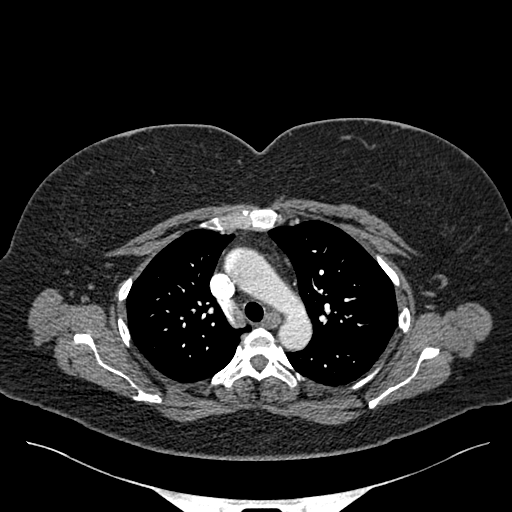
[im 204/261  lung]
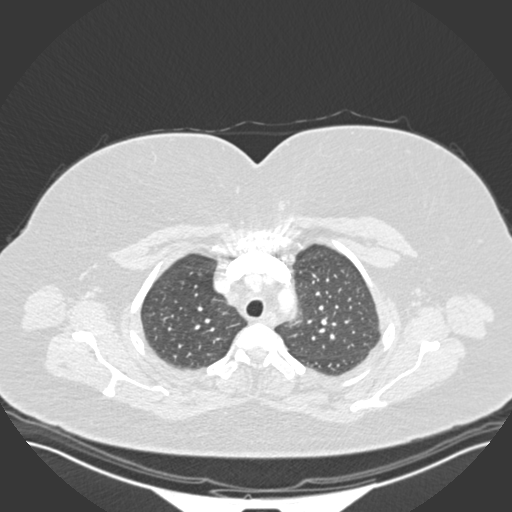
[im 215/261  soft-tissue]
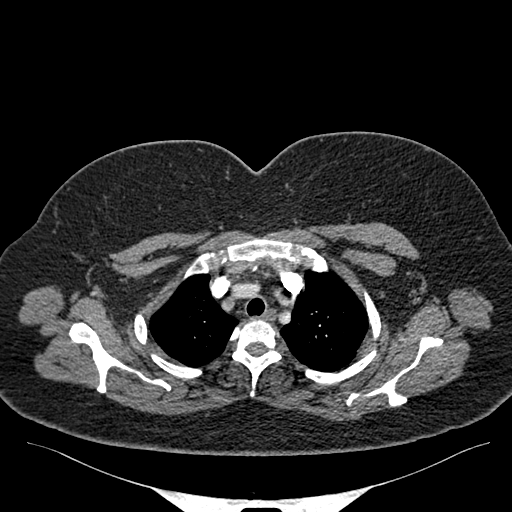
[im 227/261  lung]
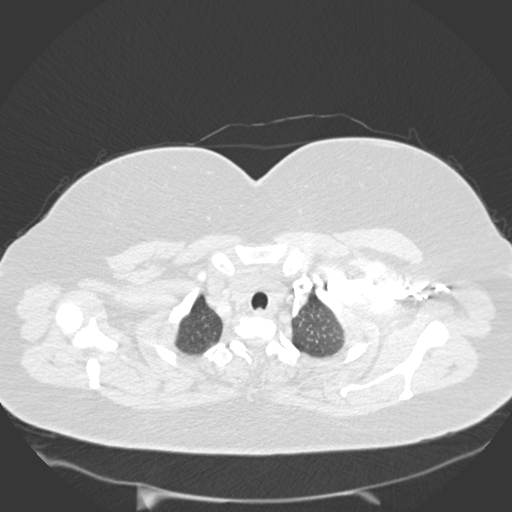
[im 249/261  soft-tissue]
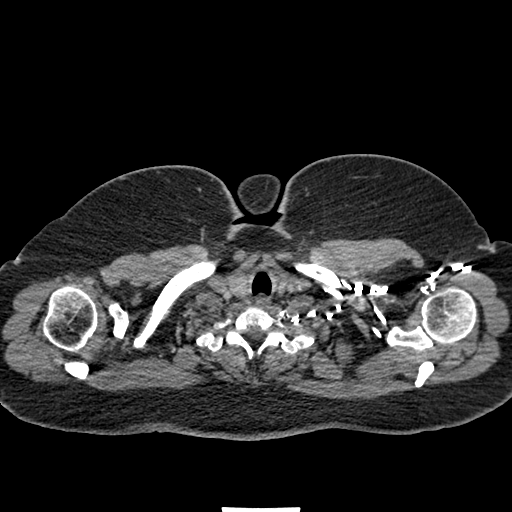

[Series 7: coronal mpr · coronal · 0.51mm/px · 2 of 88 slices shown]
[im 30/88  soft-tissue]
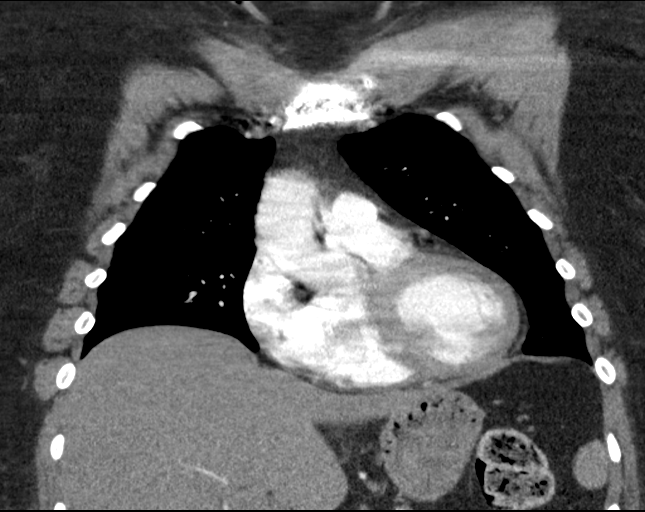
[im 59/88  soft-tissue]
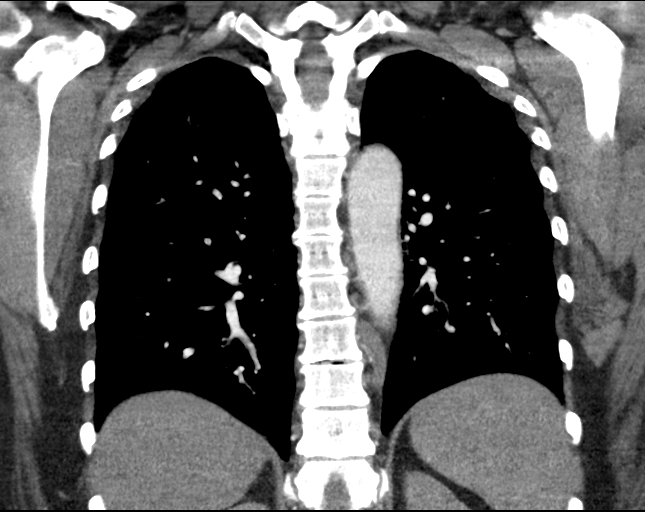

[18 of 46 positions shown; findings below may reference images not displayed]

FINDINGS: Cardiovascular: Good opacification of pulmonary arteries. Right
lower lobe superior segmental pulmonary embolus (series 5 image 125,
series 8, image 38, series 7, image 57). RV/LV ratio 0.82. Normal
heart size. Coronary artery calcifications and proximal LAD.

Mediastinum/Nodes: No enlarged mediastinal, hilar, or axillary lymph
nodes. Thyroid gland, trachea, and esophagus demonstrate no
significant findings.

Lungs/Pleura: Lungs are clear. No pleural effusion or pneumothorax.

Upper Abdomen: No acute abnormality.

Musculoskeletal: No chest wall abnormality. No acute or significant
osseous findings.

Review of the MIP images confirms the above findings.
IMPRESSION: 1. Right lower lobe superior segmental pulmonary embolus. No
evidence for right heart strain.
2. Otherwise unremarkable CT of the chest.
These results were called by telephone at the time of interpretation
on 03/10/2016 at [DATE] to Dr. CLAUDIU CAJUSTE , who verbally
acknowledged these results.

By: Ib Dabo M.D.

## 2017-01-18 ENCOUNTER — Encounter: Payer: Self-pay | Admitting: Obstetrics & Gynecology

## 2017-01-18 ENCOUNTER — Ambulatory Visit (INDEPENDENT_AMBULATORY_CARE_PROVIDER_SITE_OTHER): Payer: BC Managed Care – PPO | Admitting: Obstetrics & Gynecology

## 2017-01-18 VITALS — BP 130/90 | HR 83 | Ht 65.0 in | Wt 243.0 lb

## 2017-01-18 DIAGNOSIS — R87810 Cervical high risk human papillomavirus (HPV) DNA test positive: Secondary | ICD-10-CM

## 2017-01-18 DIAGNOSIS — R87610 Atypical squamous cells of undetermined significance on cytologic smear of cervix (ASC-US): Secondary | ICD-10-CM | POA: Diagnosis not present

## 2017-01-18 NOTE — Patient Instructions (Signed)

## 2017-01-18 NOTE — Progress Notes (Signed)
Referring Provider:  [facility]  HPI:  Shirley Rogers is a 51 y.o.  G1P1  who presents today for evaluation and management of abnormal cervical cytology.    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  OB History  Gravida Para Term Preterm AB Living  1 1          SAB TAB Ectopic Multiple Live Births               # Outcome Date GA Lbr Len/2nd Weight Sex Delivery Anes PTL Lv  1 Para               Past Medical History:  Diagnosis Date  . Anxiety    on xanax in the past  . ASCVD (arteriosclerotic cardiovascular disease)   . Barrett esophagus   . 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 Carotid atherosclerosis 2003   on ultrasound  . Collagen vascular disease ([place])   . Colon polyp   . Complicated migraine    with facial drooping  . Concussion    1974,1982,1987,2016,2016  . DDD (degenerative disc disease), lumbar    MRI 2008, L3-4, L5-S1, spondylotic changes, multilevel facet joint hypertrophic changes  . Depression    on paxil, lexapro, cymbalta in the past  . Diverticulosis   . Dizziness due to old head injury   . Family history of adverse reaction to anesthesia    father - slow to wake  . Fatty liver 2013   On CT abdomen/pelvis  . Fibrocystic breast disease   . GERD (gastroesophageal reflux disease)   . Hemiplegic migraine    daily  . Hemorrhoids   . Hyperlipidemia   . Hypertension    no meds currently.  . IBS (irritable bowel syndrome)   . Insomnia chronic  . Kidney stone 2013   on CT abdomen/pelvis  . 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 Migraine   . Narrowing of intervertebral disc space 2011   C5-6  . Normal cardiac stress test 2009   EF 66%  . Pulmonary emboli ([place])   . Recurrent sinusitis   . Recurrent UTI    question about IC in the past  . Sleep apnea    CPAP machine broken  . Stroke ([place])    2000 - no deficits  . Umbilical hernia 2025    on CT abdomen/pelvis  . Varicose veins    right lower leg  . Vascular spasm ([place])   . Vertigo    from concussive disorder.  seeing neuro 03/23/15    Past Surgical History:  Procedure Laterality Date  . Biopsy Punch Thyroid    . BREAST EXCISIONAL BIOPSY Right 2000   right mass excision - atypical hyperplasia  . BREAST SURGERY Right    atypical hyperplasia  . CHOLECYSTECTOMY N/A 07/24/2015   Procedure: LAPAROSCOPIC CHOLECYSTECTOMY WITH INTRAOPERATIVE CHOLANGIOGRAM;  Surgeon: Leonie Green, MD;  Location: [facility] ORS;  Service: General;  Laterality: N/A;  . COLONOSCOPY W/ BIOPSIES     Removed 5 polyps  . TUBAL LIGATION      SOCIAL HISTORY: History  Alcohol Use No    Comment: social   History  Drug Use No     Family History  Problem Relation Age of Onset  . Other Mother        IBS  . Hypertension Mother   .  Heart disease Father   . Diverticulosis Father   . Migraines Father   . Other Son        Ulcerative Colitis, Polonidal cyst  . Parkinson's disease Maternal Grandmother   . Cancer Maternal Grandmother   . Mental illness Paternal Grandmother   . Stroke Paternal Grandmother   . Cancer Paternal Grandmother        brain cancer  . Bipolar disorder Paternal Grandmother   . Stroke Maternal Grandfather   . Cancer Maternal Grandfather        Bladder, Prostate/bladder/prostate  . Heart disease Paternal Grandfather   . Heart attack Paternal Grandfather   . Macular degeneration Unknown   . Diabetes Brother   . Breast cancer Maternal Aunt     ALLERGIES:  Rocephin [ceftriaxone]; Statins; Compazine [prochlorperazine]; Doxycycline; Erythromycin; Sulfa antibiotics; Meloxicam; Sulfamethoxazole-trimethoprim; Ace inhibitors; Biaxin [clarithromycin]; Latex; and Morphine and related  Current Outpatient Prescriptions on File Prior to Visit  Medication Sig Dispense Refill  . apixaban (ELIQUIS) 5 MG TABS tablet Take 1 tablet (5 mg total) by mouth 2 (two) times daily. 180  tablet 3  . azaTHIOprine (IMURAN) 50 MG tablet Take 50 mg by mouth 2 (two) times daily.    Marland Kitchen b complex vitamins tablet Take 1 tablet by mouth daily.    Marland Kitchen buPROPion (WELLBUTRIN SR) 150 MG 12 hr tablet 1 tab qAM x 3 days, then 1 tab BID after that 180 tablet 1  . busPIRone (BUSPAR) 15 MG tablet Take 1 tablet (15 mg total) by mouth 3 (three) times daily as needed. 270 tablet 1  . diphenhydrAMINE (BENADRYL) 25 mg capsule Take 2 capsules (50 mg total) by mouth every 6 (six) hours as needed. 120 capsule 12  . DULoxetine (CYMBALTA) 60 MG capsule Take 1 capsule (60 mg total) by mouth daily. 90 capsule 1  . Elastic Bandages & Supports (T.E.D. THIGH LENGTH/L-REGULAR) MISC 2 application by Does not apply route every morning. 4 each 0  . EPINEPHRINE 0.3 mg/0.3 mL IJ SOAJ injection INJECT 0.3 ML( 0.3 MG) INTO THE MUSCLE ONCE 2 Device 12  . furosemide (LASIX) 20 MG tablet TAKE 1 TABLET(20 MG) BY MOUTH DAILY (Patient not taking: Reported on 11/08/2016) 90 tablet 1  . gabapentin (NEURONTIN) 300 MG capsule TAKE 1 CAPSULE(300 MG) BY MOUTH THREE TIMES DAILY    . hydrochlorothiazide (HYDRODIURIL) 25 MG tablet TAKE 1 TABLET(25 MG) BY MOUTH DAILY 90 tablet 1  . Magnesium Oxide 140 MG CAPS Take 280 mg by mouth daily. Reported on 05/14/2015    . methazolamide (NEPTAZANE) 25 MG tablet Take 25 mg by mouth 3 (three) times daily.    Marland Kitchen omeprazole (PRILOSEC) 40 MG capsule Take 40 mg by mouth daily.    Marland Kitchen PROAIR HFA 108 (90 Base) MCG/ACT inhaler INHALE 2 PUFFS BY MOUTH EVERY 4 HOURS AS NEEDED FOR WHEEZING 17 g 0  . promethazine (PHENERGAN) 25 MG tablet Take 1 tablet (25 mg total) by mouth every 8 (eight) hours as needed for nausea or vomiting. (Patient not taking: Reported on 11/08/2016) 20 tablet 0  . traMADol (ULTRAM) 50 MG tablet Take 1 tablet (50 mg total) by mouth 2 (two) times daily as needed. 56 tablet 0  . traZODone (DESYREL) 100 MG tablet TAKE 1 TABLET(100 MG) BY MOUTH AT BEDTIME AS NEEDED FOR SLEEP 90 tablet 1  .  verapamil (CALAN-SR) 240 MG CR tablet Take 1 tablet (240 mg total) by mouth daily. 90 tablet 3   Current Facility-Administered Medications on File Prior  to Visit  Medication Dose Route Frequency Provider Last Rate Last Dose  . gadopentetate dimeglumine (MAGNEVIST) injection 20 mL  20 mL Intravenous Once PRN Penumalli, Earlean Polka, MD        Physical Exam: -Vitals:  BP 130/90   Pulse 83   Ht 5\' 5"  (1.651 m)   Wt 243 lb (110.2 kg)   LMP 07/01/2013 Comment: tubal  BMI 40.44 kg/m  GEN: WD, WN, NAD.  A+ O x 3, good mood and affect. ABD:  NT, ND.  Soft, no masses.  No hernias noted.   Pelvic:   Vulva: Normal appearance.  No lesions.  Vagina: No lesions or abnormalities noted.  Support: Normal pelvic support.  Urethra No masses tenderness or scarring.  Meatus Normal size without lesions or prolapse.  Cervix: See below.  Anus: Normal exam.  No lesions.  Perineum: Normal exam.  No lesions.        Bimanual   Uterus: Normal size.  Non-tender.  Mobile.  AV.  Adnexae: No masses.  Non-tender to palpation.  Cul-de-sac: Negative for abnormality.   PROCEDURE: 1.  Urine Pregnancy Test:  not done 2.  Colposcopy performed with 4% acetic acid after verbal consent obtained                                         -Aceto-white Lesions Location(s): 12 o'clock.              -Biopsy performed at 6, 12 o'clock               -ECC indicated and performed: Yes.       -Biopsy sites made hemostatic with pressure, AgNO3, and/or Monsel's solution   -Satisfactory colposcopy: Yes.      -Evidence of Invasive cervical CA :  NO  ASSESSMENT:  Shirley Rogers is a 51 y.o. G1P1 here for  1. ASCUS with positive high risk HPV cervical   .  PLAN: 1.  I discussed the grading system of pap smears and HPV high risk viral types.  We will discuss and base management after colpo results return. 2. Follow up PAP 6 months, vs intervention if high grade dysplasia identified 3. Treatment of persistantly abnormal PAP smears  and cervical dysplasia, even mild, is discussed w pt today in detail, as well as the pros and cons of Cryo and LEEP procedures. Will consider and discuss after results.      Barnett Applebaum, MD, [facility], [facility] 01/18/2017  3:11 PM

## 2017-01-20 LAB — PATHOLOGY

## 2017-02-15 ENCOUNTER — Emergency Department: Payer: BC Managed Care – PPO

## 2017-02-15 ENCOUNTER — Emergency Department
Admission: EM | Admit: 2017-02-15 | Discharge: 2017-02-15 | Disposition: A | Discharge status: Home / Self Care | Payer: BC Managed Care – PPO | Attending: Emergency Medicine | Admitting: Emergency Medicine

## 2017-02-15 ENCOUNTER — Encounter: Payer: Self-pay | Admitting: *Deleted

## 2017-02-15 DIAGNOSIS — Z8673 Personal history of transient ischemic attack (TIA), and cerebral infarction without residual deficits: Secondary | ICD-10-CM | POA: Diagnosis not present

## 2017-02-15 DIAGNOSIS — Z87891 Personal history of nicotine dependence: Secondary | ICD-10-CM | POA: Insufficient documentation

## 2017-02-15 DIAGNOSIS — R091 Pleurisy: Secondary | ICD-10-CM | POA: Diagnosis not present

## 2017-02-15 DIAGNOSIS — Z79899 Other long term (current) drug therapy: Secondary | ICD-10-CM | POA: Insufficient documentation

## 2017-02-15 DIAGNOSIS — Z91040 Latex allergy status: Secondary | ICD-10-CM | POA: Insufficient documentation

## 2017-02-15 DIAGNOSIS — I10 Essential (primary) hypertension: Secondary | ICD-10-CM | POA: Diagnosis not present

## 2017-02-15 DIAGNOSIS — Z7901 Long term (current) use of anticoagulants: Secondary | ICD-10-CM | POA: Diagnosis not present

## 2017-02-15 DIAGNOSIS — R0602 Shortness of breath: Secondary | ICD-10-CM | POA: Diagnosis present

## 2017-02-15 LAB — HEPATIC FUNCTION PANEL
ALT: 15 U/L (ref 14–54)
AST: 24 U/L (ref 15–41)
Albumin: 4.1 g/dL (ref 3.5–5.0)
Alkaline Phosphatase: 52 U/L (ref 38–126)
TOTAL PROTEIN: 6.9 g/dL (ref 6.5–8.1)
Total Bilirubin: 0.7 mg/dL (ref 0.3–1.2)

## 2017-02-15 LAB — CBC
HCT: 38.6 % (ref 35.0–47.0)
HEMOGLOBIN: 13.3 g/dL (ref 12.0–16.0)
MCH: 30.8 pg (ref 26.0–34.0)
MCHC: 34.6 g/dL (ref 32.0–36.0)
MCV: 89.1 fL (ref 80.0–100.0)
PLATELETS: 196 10*3/uL (ref 150–440)
RBC: 4.33 MIL/uL (ref 3.80–5.20)
RDW: 13.1 % (ref 11.5–14.5)
WBC: 5.5 10*3/uL (ref 3.6–11.0)

## 2017-02-15 LAB — LIPASE, BLOOD: Lipase: 22 U/L (ref 11–51)

## 2017-02-15 LAB — BASIC METABOLIC PANEL
ANION GAP: 8 (ref 5–15)
BUN: 13 mg/dL (ref 6–20)
CALCIUM: 9.3 mg/dL (ref 8.9–10.3)
CO2: 28 mmol/L (ref 22–32)
CREATININE: 1.04 mg/dL — AB (ref 0.44–1.00)
Chloride: 104 mmol/L (ref 101–111)
GFR calc non Af Amer: >60 mL/min (ref >60)
Glucose, Bld: 110 mg/dL — ABNORMAL HIGH (ref 65–99)
Potassium: 3.5 mmol/L (ref 3.5–5.1)
SODIUM: 140 mmol/L (ref 135–145)

## 2017-02-15 LAB — TROPONIN I

## 2017-02-15 MED ORDER — IOPAMIDOL (ISOVUE-370) INJECTION 76%
75.0000 mL | Freq: Once | INTRAVENOUS | Status: AC | PRN
  Administered 2017-02-15: 75 mL via INTRAVENOUS

## 2017-02-15 NOTE — Discharge Instructions (Signed)
Please take 600 mg of ibuprofen or 500 mg of naproxen as needed for symptomatic control and return to the emergency department for any concerns. Follow-up with her primary care physician as needed.  It was a pleasure to take care of you today, and thank you for coming to our emergency department.  If you have any questions or concerns before leaving please ask the nurse to grab me and I'm more than happy to go through your aftercare instructions again.  If you were prescribed any opioid pain medication today such as Norco, Vicodin, Percocet, morphine, hydrocodone, or oxycodone please make sure you do not drive when you are taking this medication as it can alter your ability to drive safely.  If you have any concerns once you are home that you are not improving or are in fact getting worse before you can make it to your follow-up appointment, please do not hesitate to call [phone] and come back for further evaluation.  Darel Hong, MD  Results for orders placed or performed during the hospital encounter of 69/62/95  Basic metabolic panel  Result Value Ref Range   Sodium 140 135 - 145 mmol/L   Potassium 3.5 3.5 - 5.1 mmol/L   Chloride 104 101 - 111 mmol/L   CO2 28 22 - 32 mmol/L   Glucose, Bld 110 (H) 65 - 99 mg/dL   BUN 13 6 - 20 mg/dL   Creatinine, Ser 1.04 (H) 0.44 - 1.00 mg/dL   Calcium 9.3 8.9 - 10.3 mg/dL   GFR calc non Af Amer >60 >60 mL/min   GFR calc Af Amer >60 >60 mL/min   Anion gap 8 5 - 15  CBC  Result Value Ref Range   WBC 5.5 3.6 - 11.0 K/uL   RBC 4.33 3.80 - 5.20 MIL/uL   Hemoglobin 13.3 12.0 - 16.0 g/dL   HCT 38.6 35.0 - 47.0 %   MCV 89.1 80.0 - 100.0 fL   MCH 30.8 26.0 - 34.0 pg   MCHC 34.6 32.0 - 36.0 g/dL   RDW 13.1 11.5 - 14.5 %   Platelets 196 150 - 440 K/uL  Troponin I  Result Value Ref Range   Troponin I <0.03 <0.03 ng/mL  Hepatic function panel  Result Value Ref Range   Total Protein 6.9 6.5 - 8.1 g/dL   Albumin 4.1 3.5 - 5.0 g/dL   AST 24 15 - 41 U/L     ALT 15 14 - 54 U/L   Alkaline Phosphatase 52 38 - 126 U/L   Total Bilirubin 0.7 0.3 - 1.2 mg/dL   Bilirubin, Direct <0.1 (L) 0.1 - 0.5 mg/dL   Indirect Bilirubin NOT CALCULATED 0.3 - 0.9 mg/dL  Lipase, blood  Result Value Ref Range   Lipase 22 11 - 51 U/L   Dg Chest 2 View  Result Date: 02/15/2017 CLINICAL DATA:  Acute shortness of breath and cough. Right shoulder pain. Sleep apnea. EXAM: CHEST  2 VIEW COMPARISON:  08/18/2016 FINDINGS: The heart size and mediastinal contours are within normal limits. Both lungs are clear. The visualized skeletal structures are unremarkable. IMPRESSION: No active cardiopulmonary disease. Electronically Signed   By: Jerilynn Mages.  Shick M.D.   On: 02/15/2017 15:33   Ct Angio Chest Pe W/cm &/or Wo Cm  Result Date: 02/15/2017 CLINICAL DATA:  Shortness of breath, right chest pain, evaluate for PE EXAM: CT ANGIOGRAPHY CHEST WITH CONTRAST TECHNIQUE: Multidetector CT imaging of the chest was performed using the standard protocol during bolus administration of  intravenous contrast. Multiplanar CT image reconstructions and MIPs were obtained to evaluate the vascular anatomy. CONTRAST:  75 mL Isovue 370 IV COMPARISON:  CTA chest dated 03/10/2016 FINDINGS: Cardiovascular: Satisfactory opacification of the bilateral pulmonary artery to the segmental level. No evidence of pulmonary embolism. No evidence of thoracic aortic aneurysm or dissection. Heart is normal in size.  No pericardial effusion. Mediastinum/Nodes: No suspicious mediastinal lymphadenopathy. Visualized thyroid is unremarkable. Lungs/Pleura: Lungs are clear. No suspicious pulmonary nodules. No focal consolidation. No pleural effusion or pneumothorax. Upper Abdomen: Visualized upper abdomen is unremarkable. Musculoskeletal: Mild degenerative changes of the visualized thoracolumbar spine. Review of the MIP images confirms the above findings. IMPRESSION: No evidence of pulmonary embolism. No evidence of acute cardiopulmonary  disease. Electronically Signed   By: Julian Hy M.D.   On: 02/15/2017 17:57

## 2017-02-15 NOTE — ED Notes (Signed)
Pt given warm blanket and denies needs at this time.

## 2017-02-15 NOTE — ED Provider Notes (Signed)
[facility] Emergency Department Provider Note  ____________________________________________   First MD Initiated Contact with Patient 02/15/17 1630     (approximate)  I have reviewed the triage vital signs and the nursing notes.   HISTORY  Chief Complaint Shortness of Breath    HPI Shirley Rogers is a 52 y.o. female who comes to the emergency department with 3 weeks of pleuritic right shoulder pain. She says this feels like when she had a previous pulmonary embolism, although she has had pleurisy in the past and she is not sure if it feels like that as well. She has moderate severity sharp right shoulder pain mostly in the back worse with movement and worse with a deep breath. No fevers or chills. Mild abdominal pain that is not postprandial. Some nausea but no vomiting. She's had some mild lower extremity swelling recently. She is on lifelong anticoagulation with eliquis for her previous pulmonary embolism and she reports compliance. She came to the emergency department today because she went outside to walk the dog and she had a sudden onset of worsening pain which concerned her. A family member recently died of a brain aneurysm and she wanted to make sure her health was in good condition.18 months ago she had a cholecystectomy.   Past Medical History:  Diagnosis Date  . Anxiety    on xanax in the past  . ASCVD (arteriosclerotic cardiovascular disease)   . Barrett esophagus   . 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 Carotid atherosclerosis 2003   on ultrasound  . Colon polyp   . Complicated migraine    with facial drooping  . Concussion    1974,1982,1987,2016,2016  . DDD (degenerative disc disease), lumbar    MRI 2008, L3-4, L5-S1, spondylotic changes, multilevel facet joint hypertrophic changes  . Depression    on paxil, lexapro, cymbalta in the past  . Diverticulosis   . Dizziness due to old head injury   . Family history of adverse reaction to anesthesia     father - slow to wake  . Fatty liver 2013   On CT abdomen/pelvis  . Fibrocystic breast disease   . GERD (gastroesophageal reflux disease)   . Hemiplegic migraine    daily  . Hemorrhoids   . Hyperlipidemia   . Hypertension    no meds currently.  . IBS (irritable bowel syndrome)   . Insomnia chronic  . Kidney stone 2013   on CT abdomen/pelvis  . 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 Migraine   . Narrowing of intervertebral disc space 2011   C5-6  . Normal cardiac stress test 2009   EF 66%  . Pulmonary emboli ([place])   . Recurrent sinusitis   . Recurrent UTI    question about IC in the past  . Sleep apnea    CPAP machine broken  . Stroke ([place])    2000 - no deficits  . Umbilical hernia 1610   on CT abdomen/pelvis  . Varicose veins    right lower leg  . Vascular spasm ([place])   . Vertigo    from concussive disorder.  seeing neuro 03/23/15    Patient Active Problem List   Diagnosis Date Noted  . ASCUS with positive high risk HPV cervical 01/18/2017  . Increased intracranial pressure 10/06/2016  . Nephrolithiasis 07/22/2016  . Dysuria 07/22/2016  . Right sided weakness 06/10/2016  . 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  Behcet's disease ([place]) 04/24/2016  . Microhematuria 03/15/2016  . Pelvic floor dysfunction 03/15/2016  . Urethral caruncle 03/15/2016  . History of pulmonary embolus (PE) 03/11/2016  . Facial droop 02/04/2016  . Right hemiparesis ([place]) 02/04/2016  . Tobacco use 12/03/2015  . ANA positive 10/19/2015  . Transient alteration of awareness 09/24/2015  . Snoring 06/10/2015  . Sleep apnea 06/10/2015  . 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 Globus sensation 03/30/2015  . Post concussion syndrome 03/13/2015  . Depression 03/13/2015  . Benign hypertensive renal disease   . Barrett esophagus   . Diverticulosis   . Hemiplegic  migraine   . Lupus   . History of stroke   . Fall 05/25/2014    Past Surgical History:  Procedure Laterality Date  . Biopsy Punch Thyroid    . BREAST EXCISIONAL BIOPSY Right 2000   right mass excision - atypical hyperplasia  . BREAST SURGERY Right    atypical hyperplasia  . CHOLECYSTECTOMY N/A 07/24/2015   Procedure: LAPAROSCOPIC CHOLECYSTECTOMY WITH INTRAOPERATIVE CHOLANGIOGRAM;  Surgeon: Leonie Green, MD;  Location: [facility] ORS;  Service: General;  Laterality: N/A;  . COLONOSCOPY W/ BIOPSIES     Removed 5 polyps  . TUBAL LIGATION      Prior to Admission medications   Medication Sig Start Date End Date Taking? Authorizing Provider  apixaban (ELIQUIS) 5 MG TABS tablet Take 1 tablet (5 mg total) by mouth 2 (two) times daily. 12/13/16   Johnson, Megan P, DO  azaTHIOprine (IMURAN) 50 MG tablet Take 50 mg by mouth 2 (two) times daily. 04/25/16   [provider]  b complex vitamins tablet Take 1 tablet by mouth daily.    [provider]  buPROPion (WELLBUTRIN SR) 150 MG 12 hr tablet 1 tab qAM x 3 days, then 1 tab BID after that 12/13/16   Park Liter P, DO  busPIRone (BUSPAR) 15 MG tablet Take 1 tablet (15 mg total) by mouth 3 (three) times daily as needed. 12/13/16   Park Liter P, DO  diphenhydrAMINE (BENADRYL) 25 mg capsule Take 2 capsules (50 mg total) by mouth every 6 (six) hours as needed. 08/02/16   Johnson, Megan P, DO  DULoxetine (CYMBALTA) 60 MG capsule Take 1 capsule (60 mg total) by mouth daily. 12/13/16   Valerie Roys, DO  Elastic Bandages & Supports (T.E.D. THIGH LENGTH/L-REGULAR) MISC 2 application by Does not apply route every morning. 03/18/16   Merlyn Lot, MD  EPINEPHRINE 0.3 mg/0.3 mL IJ SOAJ injection INJECT 0.3 ML( 0.3 MG) INTO THE MUSCLE ONCE 12/05/16   Johnson, Megan P, DO  furosemide (LASIX) 20 MG tablet TAKE 1 TABLET(20 MG) BY MOUTH DAILY Patient not taking: Reported on 11/08/2016 09/09/16   Park Liter P, DO  gabapentin (NEURONTIN)  300 MG capsule TAKE 1 CAPSULE(300 MG) BY MOUTH THREE TIMES DAILY 08/29/16   [provider]  hydrochlorothiazide (HYDRODIURIL) 25 MG tablet TAKE 1 TABLET(25 MG) BY MOUTH DAILY 08/25/16   Park Liter P, DO  Magnesium Oxide 140 MG CAPS Take 280 mg by mouth daily. Reported on 05/14/2015    [provider]  methazolamide (NEPTAZANE) 25 MG tablet Take 25 mg by mouth 3 (three) times daily.    [provider]  omeprazole (PRILOSEC) 40 MG capsule Take 40 mg by mouth daily.    [provider]  PROAIR HFA 108 (90 Base) MCG/ACT inhaler INHALE 2 PUFFS BY MOUTH EVERY 4 HOURS AS NEEDED  FOR WHEEZING 09/26/16   Johnson, Megan P, DO  promethazine (PHENERGAN) 25 MG tablet Take 1 tablet (25 mg total) by mouth every 8 (eight) hours as needed for nausea or vomiting. Patient not taking: Reported on 11/08/2016 08/02/16   Kathrine Haddock, NP  traMADol (ULTRAM) 50 MG tablet Take 1 tablet (50 mg total) by mouth 2 (two) times daily as needed. 11/29/16   Kathrine Haddock, NP  traZODone (DESYREL) 100 MG tablet TAKE 1 TABLET(100 MG) BY MOUTH AT BEDTIME AS NEEDED FOR SLEEP 09/15/16   Johnson, Megan P, DO  verapamil (CALAN-SR) 240 MG CR tablet Take 1 tablet (240 mg total) by mouth daily. 08/02/16   Park Liter P, DO    Allergies Rocephin [ceftriaxone]; Statins; Compazine [prochlorperazine]; Doxycycline; Erythromycin; Sulfa antibiotics; Meloxicam; Sulfamethoxazole-trimethoprim; Ace inhibitors; Biaxin [clarithromycin]; Latex; and Morphine and related  Family History  Problem Relation Age of Onset  . Other Mother        IBS  . Hypertension Mother   . Heart disease Father   . Diverticulosis Father   . Migraines Father   . Other Son        Ulcerative Colitis, Polonidal cyst  . Parkinson's disease Maternal Grandmother   . Cancer Maternal Grandmother   . Mental illness Paternal Grandmother   . Stroke Paternal Grandmother   . Cancer Paternal Grandmother        brain cancer  . Bipolar disorder  Paternal Grandmother   . Stroke Maternal Grandfather   . Cancer Maternal Grandfather        Bladder, Prostate/bladder/prostate  . Heart disease Paternal Grandfather   . Heart attack Paternal Grandfather   . Macular degeneration Unknown   . Diabetes Brother   . Breast cancer Maternal Aunt     Social History Social History  Substance Use Topics  . Smoking status: Former Smoker    Quit date: 06/12/2015  . Smokeless tobacco: Never Used     Comment: 1-2 cigarettes   . Alcohol use No     Comment: social    Review of Systems Constitutional: No fever/chills Eyes: No visual changes. ENT: No sore throat. Cardiovascular: Positive chest pain. Respiratory: Positive shortness of breath. Gastrointestinal: No abdominal pain.  No nausea, no vomiting.  No diarrhea.  No constipation. Genitourinary: Negative for dysuria. Musculoskeletal: Negative for back pain. Skin: Negative for rash. Neurological: Negative for headaches, focal weakness or numbness.   ____________________________________________   PHYSICAL EXAM:  VITAL SIGNS: ED Triage Vitals  Enc Vitals Group     BP 02/15/17 1408 127/75     Pulse Rate 02/15/17 1408 92     Resp 02/15/17 1408 16     Temp 02/15/17 1408 98.1 F (36.7 C)     Temp Source 02/15/17 1408 Oral     SpO2 02/15/17 1408 95 %     Weight 02/15/17 1400 235 lb (106.6 kg)     Height 02/15/17 1400 5\' 4"  (1.626 m)     Head Circumference --      Peak Flow --      Pain Score 02/15/17 1359 7     Pain Loc --      Pain Edu? --      Excl. in [place]? --     Constitutional: Alert and oriented 4 appears comfortable nontoxic no diaphoresis speaks in full clear sentences Eyes: PERRL EOMI. Head: Atraumatic. Nose: No congestion/rhinnorhea. Mouth/Throat: No trismus Neck: No stridor.   Cardiovascular: Normal rate, regular rhythm. Grossly normal heart sounds.  Good peripheral circulation.  Respiratory: Normal respiratory effort.  No retractions. Lungs CTAB and moving good  air Gastrointestinal: Soft nontender Musculoskeletal: No lower extremity edema  legs are equal in size Neurologic:  Normal speech and language. No gross focal neurologic deficits are appreciated. Skin:  Skin is warm, dry and intact. No rash noted. Psychiatric: Mood and affect are normal. Speech and behavior are normal.    ____________________________________________   DIFFERENTIAL includes but not limited to  Pulmonary embolus, pneumonia, pleurisy, pneumothorax, biliary colic, cholecystitis ____________________________________________   LABS (all labs ordered are listed, but only abnormal results are displayed)  Labs Reviewed  BASIC METABOLIC PANEL - Abnormal; Notable for the following:       Result Value   Glucose, Bld 110 (*)    Creatinine, Ser 1.04 (*)    All other components within normal limits  HEPATIC FUNCTION PANEL - Abnormal; Notable for the following:    Bilirubin, Direct <0.1 (*)    All other components within normal limits  CBC  TROPONIN I  LIPASE, BLOOD    No signs of acute ischemia noted __________________________________________  EKG  ED ECG REPORT I, Darel Hong, the attending physician, personally viewed and interpreted this ECG.  Date: 02/15/2017 EKG Time: 1403  Rate: 92 Rhythm: normal sinus rhythm QRS Axis: normal Intervals: normal ST/T Wave abnormalities: normal Narrative Interpretation: no evidence of acute ischemia  ____________________________________________  RADIOLOGY  Chest x-ray reviewed by me no acute disease noted. CT pulmonary angiogram reviewed by me shows no signs of pulmonary embolism____________________________________________   PROCEDURES  Procedure(s) performed: no  Procedures  Critical Care performed: no  Observation: no ____________________________________________   INITIAL IMPRESSION / ASSESSMENT AND PLAN / ED COURSE  Pertinent labs & imaging results that were available during my care of the patient were  reviewed by me and considered in my medical decision making (see chart for details).  The patient appears comfortable, however she has pleuritic chest pain and shortness of breath with a known history of pulmonary embolism. She does have a shift disease which makes her higher risk for clotting. She is also obese which decreases the efficacy of eliquis.  CT pulmonary angiogram is pending.    CT pulmonary angiogram is fortunately negative for acute pathology. The patient's symptoms are chronic and may very well be related to some sort of pleurisy. I had a lengthy discussion with the patient regarding results and she is comfortable going home with nonsteroidal treatment and primary care follow-up. Strict return precautions given and she is discharged home in improved condition.  ____________________________________________   FINAL CLINICAL IMPRESSION(S) / ED DIAGNOSES  Final diagnoses:  Pleurisy      NEW MEDICATIONS STARTED DURING THIS VISIT:  Discharge Medication List as of 02/15/2017  7:32 PM       Note:  This document was prepared using Dragon voice recognition software and may include unintentional dictation errors.     Darel Hong, MD 02/15/17 [phone]

## 2017-02-15 NOTE — ED Notes (Signed)
Explained the wait to pt and told her she would be getting one of the next beds when they are available. Pt appears in no distress.

## 2017-02-15 NOTE — ED Notes (Signed)
Patient transported to CT 

## 2017-02-15 NOTE — ED Triage Notes (Signed)
PT to ED reports having SOB and right sided chest pain for the past week. PT reports a productive cough with yellow sputum. Pain radiates to pts back and pt reports no improvement with use of inhaler at home. PT reports she uses a CPAP machine at home to sleep and reports this morning she woke up gasping to catch her breath even while wearing the machine. NO fevers reported.

## 2017-02-15 NOTE — ED Notes (Signed)
Pt stating pain in her right shoulder blade area for the last 3 weeks with it increasing. Pt stating SOB started on Sunday. Pt stating that she woke up today out of breath and coughing up yellow. Pt is unsure of fevers at home. Pt denying any nasal congestion. Pt denying n/v. Pt stating pain is sharp with deep inhalation but a dull ache at rest.

## 2017-02-16 ENCOUNTER — Other Ambulatory Visit: Payer: Self-pay

## 2017-02-16 ENCOUNTER — Telehealth: Payer: Self-pay

## 2017-02-16 DIAGNOSIS — Z1212 Encounter for screening for malignant neoplasm of rectum: Principal | ICD-10-CM

## 2017-02-16 DIAGNOSIS — Z1211 Encounter for screening for malignant neoplasm of colon: Secondary | ICD-10-CM

## 2017-02-16 MED ORDER — NA SULFATE-K SULFATE-MG SULF 17.5-3.13-1.6 GM/177ML PO SOLN: 1.0000 | Freq: Once | ORAL | 0 refills | Status: AC

## 2017-02-16 NOTE — Telephone Encounter (Signed)
Gastroenterology Pre-Procedure Review  Request Date: 10/5 Requesting Physician: Dr. Vicente Males  PATIENT REVIEW QUESTIONS: The patient responded to the following health history questions as indicated:    1. Are you having any GI issues? yes (diverticulitis, Barretts esophagus) 2. Do you have a personal history of Polyps? no 3. Do you have a family history of Colon Cancer or Polyps? no 4. Diabetes Mellitus? no 5. Joint replacements in the past 12 months?no 6. Major health problems in the past 3 months?yes (Stroke) 7. Any artificial heart valves, MVP, or defibrillator?no    MEDICATIONS & ALLERGIES:    Patient reports the following regarding taking any anticoagulation/antiplatelet therapy:   Plavix, Coumadin, Eliquis, Xarelto, Lovenox, Pradaxa, Brilinta, or Effient? yes (Eliquis) Aspirin? no  Patient confirms/reports the following medications:  Current Outpatient Prescriptions  Medication Sig Dispense Refill  . apixaban (ELIQUIS) 5 MG TABS tablet Take 1 tablet (5 mg total) by mouth 2 (two) times daily. 180 tablet 3  . azaTHIOprine (IMURAN) 50 MG tablet Take 50 mg by mouth 2 (two) times daily.    Marland Kitchen b complex vitamins tablet Take 1 tablet by mouth daily.    Marland Kitchen buPROPion (WELLBUTRIN SR) 150 MG 12 hr tablet 1 tab qAM x 3 days, then 1 tab BID after that 180 tablet 1  . busPIRone (BUSPAR) 15 MG tablet Take 1 tablet (15 mg total) by mouth 3 (three) times daily as needed. 270 tablet 1  . diphenhydrAMINE (BENADRYL) 25 mg capsule Take 2 capsules (50 mg total) by mouth every 6 (six) hours as needed. 120 capsule 12  . DULoxetine (CYMBALTA) 60 MG capsule Take 1 capsule (60 mg total) by mouth daily. 90 capsule 1  . Elastic Bandages & Supports (T.E.D. THIGH LENGTH/L-REGULAR) MISC 2 application by Does not apply route every morning. 4 each 0  . EPINEPHRINE 0.3 mg/0.3 mL IJ SOAJ injection INJECT 0.3 ML( 0.3 MG) INTO THE MUSCLE ONCE 2 Device 12  . furosemide (LASIX) 20 MG tablet TAKE 1 TABLET(20 MG) BY MOUTH  DAILY (Patient not taking: Reported on 11/08/2016) 90 tablet 1  . gabapentin (NEURONTIN) 300 MG capsule TAKE 1 CAPSULE(300 MG) BY MOUTH THREE TIMES DAILY    . hydrochlorothiazide (HYDRODIURIL) 25 MG tablet TAKE 1 TABLET(25 MG) BY MOUTH DAILY 90 tablet 1  . Magnesium Oxide 140 MG CAPS Take 280 mg by mouth daily. Reported on 05/14/2015    . methazolamide (NEPTAZANE) 25 MG tablet Take 25 mg by mouth 3 (three) times daily.    Marland Kitchen omeprazole (PRILOSEC) 40 MG capsule Take 40 mg by mouth daily.    Marland Kitchen PROAIR HFA 108 (90 Base) MCG/ACT inhaler INHALE 2 PUFFS BY MOUTH EVERY 4 HOURS AS NEEDED FOR WHEEZING 17 g 0  . promethazine (PHENERGAN) 25 MG tablet Take 1 tablet (25 mg total) by mouth every 8 (eight) hours as needed for nausea or vomiting. (Patient not taking: Reported on 11/08/2016) 20 tablet 0  . traMADol (ULTRAM) 50 MG tablet Take 1 tablet (50 mg total) by mouth 2 (two) times daily as needed. 56 tablet 0  . traZODone (DESYREL) 100 MG tablet TAKE 1 TABLET(100 MG) BY MOUTH AT BEDTIME AS NEEDED FOR SLEEP 90 tablet 1  . verapamil (CALAN-SR) 240 MG CR tablet Take 1 tablet (240 mg total) by mouth daily. 90 tablet 3   No current facility-administered medications for this visit.    Facility-Administered Medications Ordered in Other Visits  Medication Dose Route Frequency Provider Last Rate Last Dose  . gadopentetate dimeglumine (MAGNEVIST) injection 20  mL  20 mL Intravenous Once PRN Penumalli, Earlean Polka, MD        Patient confirms/reports the following allergies:  Allergies  Allergen Reactions  . Rocephin [Ceftriaxone] Anaphylaxis  . Statins Anaphylaxis, Diarrhea and Nausea And Vomiting  . Compazine [Prochlorperazine] Other (See Comments)    akathesia akathesia akathesia  . Doxycycline Nausea And Vomiting  . Erythromycin Itching, Nausea And Vomiting and Rash    Patient states she is allergic to all mycin drugs  . Sulfa Antibiotics Diarrhea and Nausea And Vomiting  . Meloxicam   .  Sulfamethoxazole-Trimethoprim Other (See Comments)  . Ace Inhibitors Itching  . Biaxin [Clarithromycin] Itching, Nausea And Vomiting and Rash  . Latex Rash  . Morphine And Related Nausea And Vomiting    No orders of the defined types were placed in this encounter.   AUTHORIZATION INFORMATION Primary Insurance: 1D#: Group #:  Secondary Insurance: 1D#: Group #:  SCHEDULE INFORMATION: Date: 10/5  Time: Location: [facility]

## 2017-02-17 ENCOUNTER — Other Ambulatory Visit: Payer: Self-pay | Admitting: Family Medicine

## 2017-02-20 ENCOUNTER — Telehealth: Payer: Self-pay | Admitting: Family Medicine

## 2017-02-20 MED ORDER — ENOXAPARIN SODIUM 40 MG/0.4ML ~~LOC~~ SOLN: 40.0000 mg | SUBCUTANEOUS | 0 refills | Status: DC

## 2017-02-20 NOTE — Telephone Encounter (Signed)
Called and left a message for patient to return my call.  

## 2017-02-20 NOTE — Telephone Encounter (Addendum)
Please let Shirley Rogers know that her GI doctor doesn't want her to continue on her elaquis for her colonoscopy. Given her history of PEs, we're going to bridge her with lovanox. I'd like her to stop her elaquis 4 days before her procedure and start lovenox 3 days before her procedure. I'll send her through a Rx to her pharmacy. She can stop the lovenox 3 days after the procedure and restart the elaquis 4 days after the procedure.

## 2017-02-20 NOTE — Telephone Encounter (Signed)
Just a reminder to contact [facility] regarding the question of blood thinner  Thanks

## 2017-02-21 NOTE — Telephone Encounter (Signed)
Patient notified

## 2017-02-26 ENCOUNTER — Other Ambulatory Visit: Payer: Self-pay | Admitting: Family Medicine

## 2017-02-27 ENCOUNTER — Other Ambulatory Visit: Payer: Self-pay | Admitting: Family Medicine

## 2017-03-03 ENCOUNTER — Ambulatory Visit: Payer: BC Managed Care – PPO | Admitting: Anesthesiology

## 2017-03-03 ENCOUNTER — Encounter: Payer: Self-pay | Admitting: *Deleted

## 2017-03-03 ENCOUNTER — Ambulatory Visit
Admission: RE | Admit: 2017-03-03 | Discharge: 2017-03-03 | Disposition: A | Discharge status: Home / Self Care | Payer: BC Managed Care – PPO | Source: Ambulatory Visit | Attending: Gastroenterology | Admitting: Gastroenterology

## 2017-03-03 ENCOUNTER — Encounter
Admission: RE | Disposition: A | Discharge status: Home / Self Care | Payer: Self-pay | Source: Ambulatory Visit | Attending: Gastroenterology

## 2017-03-03 DIAGNOSIS — M329 Systemic lupus erythematosus, unspecified: Secondary | ICD-10-CM | POA: Insufficient documentation

## 2017-03-03 DIAGNOSIS — Z86711 Personal history of pulmonary embolism: Secondary | ICD-10-CM | POA: Insufficient documentation

## 2017-03-03 DIAGNOSIS — F329 Major depressive disorder, single episode, unspecified: Secondary | ICD-10-CM | POA: Insufficient documentation

## 2017-03-03 DIAGNOSIS — I2510 Atherosclerotic heart disease of native coronary artery without angina pectoris: Secondary | ICD-10-CM | POA: Insufficient documentation

## 2017-03-03 DIAGNOSIS — Z7901 Long term (current) use of anticoagulants: Secondary | ICD-10-CM | POA: Insufficient documentation

## 2017-03-03 DIAGNOSIS — Z1211 Encounter for screening for malignant neoplasm of colon: Secondary | ICD-10-CM | POA: Diagnosis not present

## 2017-03-03 DIAGNOSIS — Z1212 Encounter for screening for malignant neoplasm of rectum: Secondary | ICD-10-CM

## 2017-03-03 DIAGNOSIS — J449 Chronic obstructive pulmonary disease, unspecified: Secondary | ICD-10-CM | POA: Insufficient documentation

## 2017-03-03 DIAGNOSIS — I10 Essential (primary) hypertension: Secondary | ICD-10-CM | POA: Diagnosis not present

## 2017-03-03 DIAGNOSIS — K589 Irritable bowel syndrome without diarrhea: Secondary | ICD-10-CM | POA: Diagnosis not present

## 2017-03-03 DIAGNOSIS — K2270 Barrett's esophagus without dysplasia: Secondary | ICD-10-CM | POA: Insufficient documentation

## 2017-03-03 DIAGNOSIS — F17210 Nicotine dependence, cigarettes, uncomplicated: Secondary | ICD-10-CM | POA: Diagnosis not present

## 2017-03-03 DIAGNOSIS — G43909 Migraine, unspecified, not intractable, without status migrainosus: Secondary | ICD-10-CM | POA: Diagnosis not present

## 2017-03-03 DIAGNOSIS — Z87440 Personal history of urinary (tract) infections: Secondary | ICD-10-CM | POA: Diagnosis not present

## 2017-03-03 DIAGNOSIS — G4730 Sleep apnea, unspecified: Secondary | ICD-10-CM | POA: Insufficient documentation

## 2017-03-03 DIAGNOSIS — M352 Behcet's disease: Secondary | ICD-10-CM | POA: Diagnosis not present

## 2017-03-03 DIAGNOSIS — Z8673 Personal history of transient ischemic attack (TIA), and cerebral infarction without residual deficits: Secondary | ICD-10-CM | POA: Diagnosis not present

## 2017-03-03 DIAGNOSIS — Z79899 Other long term (current) drug therapy: Secondary | ICD-10-CM | POA: Insufficient documentation

## 2017-03-03 DIAGNOSIS — E785 Hyperlipidemia, unspecified: Secondary | ICD-10-CM | POA: Insufficient documentation

## 2017-03-03 DIAGNOSIS — Z86010 Personal history of colonic polyps: Secondary | ICD-10-CM | POA: Insufficient documentation

## 2017-03-03 DIAGNOSIS — K219 Gastro-esophageal reflux disease without esophagitis: Secondary | ICD-10-CM | POA: Diagnosis not present

## 2017-03-03 DIAGNOSIS — F419 Anxiety disorder, unspecified: Secondary | ICD-10-CM | POA: Diagnosis not present

## 2017-03-03 DIAGNOSIS — M5136 Other intervertebral disc degeneration, lumbar region: Secondary | ICD-10-CM | POA: Diagnosis not present

## 2017-03-03 DIAGNOSIS — G4700 Insomnia, unspecified: Secondary | ICD-10-CM | POA: Diagnosis not present

## 2017-03-03 HISTORY — PX: COLONOSCOPY WITH PROPOFOL: SHX5780

## 2017-03-03 SURGERY — COLONOSCOPY WITH PROPOFOL
Anesthesia: General

## 2017-03-03 MED ORDER — LIDOCAINE 2% (20 MG/ML) 5 ML SYRINGE
INTRAMUSCULAR | Status: DC | PRN
  Administered 2017-03-03: 25 mg via INTRAVENOUS

## 2017-03-03 MED ORDER — SODIUM CHLORIDE 0.9 % IV SOLN
INTRAVENOUS | Status: DC
  Administered 2017-03-03: 1000 mL via INTRAVENOUS

## 2017-03-03 MED ORDER — PROPOFOL 10 MG/ML IV BOLUS
INTRAVENOUS | Status: DC | PRN
  Administered 2017-03-03: 30 mg via INTRAVENOUS
  Administered 2017-03-03: 70 mg via INTRAVENOUS

## 2017-03-03 MED ORDER — PROPOFOL 500 MG/50ML IV EMUL
INTRAVENOUS | Status: DC | PRN
  Administered 2017-03-03: 120 ug/kg/min via INTRAVENOUS

## 2017-03-03 NOTE — Op Note (Signed)
[facility] Patient Name: Shirley Rogers Procedure Date: 03/03/2017 11:15 AM MRN: [id number] Account #: [id number] Date of Birth: [birth year] Admit Type: Outpatient Age: 51 Room: [facility] ENDO ROOM 3 Gender: Female Note Status: Finalized Procedure:            Colonoscopy Indications:          High risk colon cancer surveillance: Personal history                        of colonic polyps Providers:            [name] MD, MD Referring MD:         Valerie Roys (Referring MD) Medicines:            Monitored Anesthesia Care Complications:        No immediate complications. Procedure:            Pre-Anesthesia Assessment:                       - Prior to the procedure, a History and Physical was                        performed, and patient medications, allergies and                        sensitivities were reviewed. The patient's tolerance of                        previous anesthesia was reviewed.                       - The risks and benefits of the procedure and the                        sedation options and risks were discussed with the                        patient. All questions were answered and informed                        consent was obtained.                       - ASA Grade Assessment: III - A patient with severe                        systemic disease.                       After obtaining informed consent, the colonoscope was                        passed under direct vision. Throughout the procedure,                        the patient's blood pressure, pulse, and oxygen                        saturations were monitored continuously. The                        Colonoscope  was introduced through the anus with the                        intention of advancing to the cecum. The scope was                        advanced to the sigmoid colon before the procedure was                        aborted. Medications were given. The colonoscopy  was                        performed with ease. The patient tolerated the                        procedure well. The quality of the bowel preparation                        was inadequate. Findings:      The perianal and digital rectal examinations were normal.      A large amount of solid stool was found in the recto-sigmoid colon,       precluding visualization.      The exam was otherwise without abnormality. Impression:           - Preparation of the colon was inadequate.                       - Stool in the recto-sigmoid colon.                       - The examination was otherwise normal.                       - No specimens collected. Recommendation:       - Discharge patient to home (with escort).                       - Resume previous diet.                       - Continue present medications.                       - Repeat colonoscopy in 2 weeks because the bowel                        preparation was suboptimal. Procedure Code(s):    --- Professional ---                       U5427, 51, Colorectal cancer screening; colonoscopy on                        individual at high risk Diagnosis Code(s):    --- Professional ---                       Z86.010, Personal history of colonic polyps CPT copyright 2016 American Medical Association. All rights reserved. The codes documented in this report are preliminary and upon coder review may  be revised to meet current compliance requirements. [name], MD [name] MD, MD 03/03/2017 11:22:52  AM This report has been signed electronically. Number of Addenda: 0 Note Initiated On: 03/03/2017 11:15 AM Total Procedure Duration: 0 hours 0 minutes 40 seconds       [facility]

## 2017-03-03 NOTE — Anesthesia Postprocedure Evaluation (Signed)
Anesthesia Post Note  Patient: Shirley Rogers  Procedure(s) Performed: COLONOSCOPY WITH PROPOFOL (N/A )  Patient location during evaluation: Endoscopy Anesthesia Type: General Level of consciousness: awake and alert Pain management: pain level controlled Vital Signs Assessment: post-procedure vital signs reviewed and stable Respiratory status: spontaneous breathing and respiratory function stable Cardiovascular status: stable Anesthetic complications: no     Last Vitals:  Vitals:   03/03/17 1024 03/03/17 1127  BP: (!) 130/99 103/75  Pulse: 81 78  Resp: 18 18  Temp: 36.7 C   SpO2: 100% 99%    Last Pain:  Vitals:   03/03/17 1024  TempSrc: Tympanic  PainSc: 7                  [name] K

## 2017-03-03 NOTE — Transfer of Care (Signed)
Immediate Anesthesia Transfer of Care Note  Patient: Shirley Rogers  Procedure(s) Performed: COLONOSCOPY WITH PROPOFOL (N/A )  Patient Location: Endoscopy Unit  Anesthesia Type:General  Level of Consciousness: awake and alert   Airway & Oxygen Therapy: Patient Spontanous Breathing and Patient connected to nasal cannula oxygen  Post-op Assessment: Report given to RN and Post -op Vital signs reviewed and stable  Post vital signs: Reviewed  Last Vitals:  Vitals:   03/03/17 1024 03/03/17 1127  BP: (!) 130/99 103/75  Pulse: 81 78  Resp: 18 18  Temp: 36.7 C   SpO2: 100% 99%    Last Pain:  Vitals:   03/03/17 1024  TempSrc: Tympanic  PainSc: 7          Complications: No apparent anesthesia complications

## 2017-03-03 NOTE — Anesthesia Preprocedure Evaluation (Signed)
Anesthesia Evaluation  Patient identified by MRN, date of birth, ID band Patient awake    Reviewed: Allergy & Precautions, NPO status , Patient's Chart, lab work & pertinent test results  History of Anesthesia Complications (+) Family history of anesthesia reactionNegative for: history of anesthetic complications  Airway Mallampati: III       Dental  (+) Partial Lower, Loose,    Pulmonary sleep apnea and Continuous Positive Airway Pressure Ventilation , neg COPD, former smoker,           Cardiovascular hypertension, Pt. on medications (-) Past MI and (-) CHF (-) dysrhythmias (-) Valvular Problems/Murmurs     Neuro/Psych Anxiety Depression CVA    GI/Hepatic Neg liver ROS, GERD  Medicated and Poorly Controlled,  Endo/Other  neg diabetes  Renal/GU Renal disease (stones)     Musculoskeletal   Abdominal   Peds  Hematology   Anesthesia Other Findings   Reproductive/Obstetrics                            Anesthesia Physical Anesthesia Plan  ASA: III  Anesthesia Plan: General   Post-op Pain Management:    Induction: Intravenous  PONV Risk Score and Plan: Propofol infusion  Airway Management Planned: Nasal Cannula  Additional Equipment:   Intra-op Plan:   Post-operative Plan:   Informed Consent: I have reviewed the patients History and Physical, chart, labs and discussed the procedure including the risks, benefits and alternatives for the proposed anesthesia with the patient or authorized representative who has indicated his/her understanding and acceptance.     Plan Discussed with:   Anesthesia Plan Comments:         Anesthesia Quick Evaluation

## 2017-03-03 NOTE — H&P (Addendum)
[name] MD [address], [place], [place] [address] Phone: [phone] Fax : [phone]  Primary Care Physician:  Valerie Roys, DO Primary Gastroenterologist:  Dr. [name]   Pre-Procedure History & Physical: HPI:  Shirley Rogers is a 51 y.o. female is here for an colonoscopy.   Past Medical History:  Diagnosis Date  . Anxiety    on xanax in the past  . ASCVD (arteriosclerotic cardiovascular disease)   . Barrett esophagus   . 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 Carotid atherosclerosis 2003   on ultrasound  . Colon polyp   . Complicated migraine    with facial drooping  . Concussion    1974,1982,1987,2016,2016  . DDD (degenerative disc disease), lumbar    MRI 2008, L3-4, L5-S1, spondylotic changes, multilevel facet joint hypertrophic changes  . Depression    on paxil, lexapro, cymbalta in the past  . Diverticulosis   . Dizziness due to old head injury   . Family history of adverse reaction to anesthesia    father - slow to wake  . Fatty liver 2013   On CT abdomen/pelvis  . Fibrocystic breast disease   . GERD (gastroesophageal reflux disease)   . Hemiplegic migraine    daily  . Hemorrhoids   . Hyperlipidemia   . Hypertension    no meds currently.  . IBS (irritable bowel syndrome)   . Insomnia chronic  . Kidney stone 2013   on CT abdomen/pelvis  . 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 Migraine   . Narrowing of intervertebral disc space 2011   C5-6  . Normal cardiac stress test 2009   EF 66%  . Pulmonary emboli ([place])   . Recurrent sinusitis   . Recurrent UTI    question about IC in the past  . Sleep apnea    CPAP machine broken  . Stroke ([place])    2000 - no deficits  . Umbilical hernia 9509   on CT abdomen/pelvis  . Varicose veins    right lower leg  . Vascular spasm ([place])   . Vertigo    from concussive disorder.  seeing neuro 03/23/15    Past Surgical History:  Procedure  Laterality Date  . Biopsy Punch Thyroid    . BREAST EXCISIONAL BIOPSY Right 2000   right mass excision - atypical hyperplasia  . BREAST SURGERY Right    atypical hyperplasia  . CHOLECYSTECTOMY N/A 07/24/2015   Procedure: LAPAROSCOPIC CHOLECYSTECTOMY WITH INTRAOPERATIVE CHOLANGIOGRAM;  Surgeon: Leonie Green, MD;  Location: [facility] ORS;  Service: General;  Laterality: N/A;  . COLONOSCOPY W/ BIOPSIES     Removed 5 polyps  . TUBAL LIGATION      Prior to Admission medications   Medication Sig Start Date End Date Taking? Authorizing Provider  apixaban (ELIQUIS) 5 MG TABS tablet Take 1 tablet (5 mg total) by mouth 2 (two) times daily. 12/13/16  Yes Johnson, Megan P, DO  azaTHIOprine (IMURAN) 50 MG tablet Take 50 mg by mouth 2 (two) times daily. 04/25/16   [provider]  b complex vitamins tablet Take 1 tablet by mouth daily.    [provider]  buPROPion (WELLBUTRIN SR) 150 MG 12 hr tablet 1 tab qAM x 3 days, then 1 tab BID after that 12/13/16   Park Liter P, DO  busPIRone (BUSPAR) 15 MG tablet Take 1 tablet (15 mg total) by mouth 3 (three) times daily  as needed. 12/13/16   Johnson, Megan P, DO  citalopram (CELEXA) 20 MG tablet TAKE 1 AND 1/2 TABLETS(30 MG) BY MOUTH DAILY 02/27/17   Wynetta Emery, Megan P, DO  diphenhydrAMINE (BENADRYL) 25 mg capsule Take 2 capsules (50 mg total) by mouth every 6 (six) hours as needed. 08/02/16   Johnson, Megan P, DO  DULoxetine (CYMBALTA) 60 MG capsule Take 1 capsule (60 mg total) by mouth daily. 12/13/16   Johnson, Megan P, DO  DULoxetine (CYMBALTA) 60 MG capsule TAKE 1 CAPSULE(60 MG) BY MOUTH DAILY 02/17/17   Valerie Roys, DO  Elastic Bandages & Supports (T.E.D. THIGH LENGTH/L-REGULAR) MISC 2 application by Does not apply route every morning. 03/18/16   Merlyn Lot, MD  EPINEPHRINE 0.3 mg/0.3 mL IJ SOAJ injection INJECT 0.3 ML( 0.3 MG) INTO THE MUSCLE ONCE 12/05/16   Johnson, Megan P, DO  furosemide (LASIX) 20 MG tablet TAKE 1 TABLET(20 MG)  BY MOUTH DAILY 02/27/17   Johnson, Megan P, DO  gabapentin (NEURONTIN) 300 MG capsule TAKE 1 CAPSULE(300 MG) BY MOUTH THREE TIMES DAILY 08/29/16   [provider]  hydrochlorothiazide (HYDRODIURIL) 25 MG tablet TAKE 1 TABLET(25 MG) BY MOUTH DAILY 02/17/17   Park Liter P, DO  Magnesium Oxide 140 MG CAPS Take 280 mg by mouth daily. Reported on 05/14/2015    [provider]  methazolamide (NEPTAZANE) 25 MG tablet Take 25 mg by mouth 3 (three) times daily.    [provider]  omeprazole (PRILOSEC) 40 MG capsule Take 40 mg by mouth daily.    [provider]  PROAIR HFA 108 (90 Base) MCG/ACT inhaler INHALE 2 PUFFS BY MOUTH EVERY 4 HOURS AS NEEDED FOR WHEEZING 09/26/16   Johnson, Megan P, DO  promethazine (PHENERGAN) 25 MG tablet Take 1 tablet (25 mg total) by mouth every 8 (eight) hours as needed for nausea or vomiting. Patient not taking: Reported on 11/08/2016 08/02/16   Kathrine Haddock, NP  traMADol (ULTRAM) 50 MG tablet Take 1 tablet (50 mg total) by mouth 2 (two) times daily as needed. 11/29/16   Kathrine Haddock, NP  traZODone (DESYREL) 100 MG tablet TAKE 1 TABLET(100 MG) BY MOUTH AT BEDTIME AS NEEDED FOR SLEEP 09/15/16   Johnson, Megan P, DO  verapamil (CALAN-SR) 240 MG CR tablet Take 1 tablet (240 mg total) by mouth daily. 08/02/16   Park Liter P, DO    Allergies as of 02/17/2017 - Review Complete 02/15/2017  Allergen Reaction Noted  . Rocephin [ceftriaxone] Anaphylaxis 05/08/2014  . Statins Anaphylaxis, Diarrhea, and Nausea And Vomiting 12/03/2015  . Compazine [prochlorperazine] Other (See Comments) 03/31/2015  . Doxycycline Nausea And Vomiting 08/13/2015  . Erythromycin Itching, Nausea And Vomiting, and Rash 03/31/2015  . Sulfa antibiotics Diarrhea and Nausea And Vomiting 03/31/2015  . Meloxicam  12/03/2015  . Sulfamethoxazole-trimethoprim Other (See Comments) 05/04/2015  . Ace inhibitors Itching 03/31/2015  . Biaxin [clarithromycin] Itching, Nausea And  Vomiting, and Rash 05/08/2014  . Latex Rash 03/31/2015  . Morphine and related Nausea And Vomiting 05/08/2014    Family History  Problem Relation Age of Onset  . Other Mother        IBS  . Hypertension Mother   . Heart disease Father   . Diverticulosis Father   . Migraines Father   . Other Son        Ulcerative Colitis, Polonidal cyst  . Parkinson's disease Maternal Grandmother   . Cancer Maternal Grandmother   . Mental illness Paternal Grandmother   . Stroke Paternal  Grandmother   . Cancer Paternal Grandmother        brain cancer  . Bipolar disorder Paternal Grandmother   . Stroke Maternal Grandfather   . Cancer Maternal Grandfather        Bladder, Prostate/bladder/prostate  . Heart disease Paternal Grandfather   . Heart attack Paternal Grandfather   . Macular degeneration Unknown   . Diabetes Brother   . Breast cancer Maternal Aunt     Social History   Social History  . Marital status: Divorced    Spouse name: N/A  . Number of children: 1  . Years of education: 41   Occupational History  .      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 Smoking status: Current Every Day Smoker    Types: Cigarettes    Last attempt to quit: 06/12/2015  . Smokeless tobacco: Never Used     Comment: 1-2 cigarettes   . Alcohol use No     Comment: social  . Drug use: No  . Sexual activity: No     Comment: tubal ligation and menopause   Other Topics Concern  . Not on file   Social History Narrative   Lives at home with friend   Caffeine use- coffee 1 cup daily    Review of Systems: See HPI, otherwise negative ROS  Physical Exam: BP (!) 130/99   Pulse 81   Temp 98 F (36.7 C) (Tympanic)   Resp 18   Ht 5\' 4"  (1.626 m)   Wt 235 lb (106.6 kg)   LMP 07/01/2013 Comment: tubal  SpO2 100%   BMI 40.34 kg/m  General:   Alert,  pleasant and cooperative in NAD Head:  Normocephalic and atraumatic. Neck:  Supple; no masses or thyromegaly. Lungs:  Clear  throughout to auscultation.    Heart:  Regular rate and rhythm. Abdomen:  Soft, nontender and nondistended. Normal bowel sounds, without guarding, and without rebound.   Neurologic:  Alert and  oriented x4;  grossly normal neurologically.  Impression/Plan: [name] is here for an colonoscopy to be performed for Screening colonoscopy prior history of colon polyps   Risks, benefits, limitations, and alternatives regarding  colonoscopy have been reviewed with the patient.  Questions have been answered.  All parties agreeable.   [name], MD  03/03/2017, 10:41 AM

## 2017-03-03 NOTE — Anesthesia Post-op Follow-up Note (Signed)
Anesthesia QCDR form completed.        

## 2017-03-05 ENCOUNTER — Other Ambulatory Visit: Payer: Self-pay | Admitting: Unknown Physician Specialty

## 2017-03-06 ENCOUNTER — Encounter: Payer: Self-pay | Admitting: Gastroenterology

## 2017-03-06 MED ORDER — TRAMADOL HCL 50 MG PO TABS: 50.0000 mg | ORAL_TABLET | Freq: Two times a day (BID) | ORAL | 0 refills | Status: DC | PRN

## 2017-03-06 NOTE — Telephone Encounter (Signed)
Called into [facility]. 

## 2017-03-06 NOTE — Telephone Encounter (Signed)
Please call in her tramadol. Thanks!

## 2017-03-29 ENCOUNTER — Ambulatory Visit (INDEPENDENT_AMBULATORY_CARE_PROVIDER_SITE_OTHER): Payer: BC Managed Care – PPO | Admitting: Family Medicine

## 2017-03-29 ENCOUNTER — Ambulatory Visit
Admission: RE | Admit: 2017-03-29 | Discharge: 2017-03-29 | Disposition: A | Discharge status: Home / Self Care | Payer: BC Managed Care – PPO | Source: Ambulatory Visit | Attending: Family Medicine | Admitting: Family Medicine

## 2017-03-29 ENCOUNTER — Telehealth: Payer: Self-pay | Admitting: Family Medicine

## 2017-03-29 ENCOUNTER — Encounter: Payer: Self-pay | Admitting: Family Medicine

## 2017-03-29 VITALS — BP 151/85 | HR 80 | Temp 98.8°F | Wt 244.0 lb

## 2017-03-29 DIAGNOSIS — M1711 Unilateral primary osteoarthritis, right knee: Secondary | ICD-10-CM | POA: Insufficient documentation

## 2017-03-29 DIAGNOSIS — Z23 Encounter for immunization: Secondary | ICD-10-CM

## 2017-03-29 DIAGNOSIS — G8929 Other chronic pain: Secondary | ICD-10-CM

## 2017-03-29 DIAGNOSIS — M25562 Pain in left knee: Principal | ICD-10-CM

## 2017-03-29 DIAGNOSIS — M25561 Pain in right knee: Secondary | ICD-10-CM

## 2017-03-29 MED ORDER — PREDNISONE 50 MG PO TABS: 50.0000 mg | ORAL_TABLET | Freq: Every day | ORAL | 0 refills | Status: DC

## 2017-03-29 NOTE — Patient Instructions (Addendum)

## 2017-03-29 NOTE — Telephone Encounter (Signed)
Copied from [place] #[id number]. Topic: Inquiry >> Mar 29, 2017 10:27 AM Neva Seat wrote: Pt went by [facility] this morning after appt. With Dr Wynetta Emery to pick up Tramadol.  The pharmacy has no record of the Rx.  She said Dr. Wynetta Emery said to call her if there is an issue with Rx at the pharmacy.   Medication was called into [facility], they had it on hold.  Prescription cancelled there and called into [facility].

## 2017-03-29 NOTE — Progress Notes (Signed)
BP (!) 151/85 (BP Location: Left Arm, Patient Position: Sitting, Cuff Size: Large)   Pulse 80   Temp 98.8 F (37.1 C)   Wt 244 lb (110.7 kg)   LMP 07/01/2013 Comment: tubal  SpO2 98%   BMI 41.88 kg/m    Subjective:    Patient ID: Shirley Rogers, female    DOB: May 24, 1966, 51 y.o.   MRN: [id number]  HPI: Shirley Rogers is a 51 y.o. female  Chief Complaint  Patient presents with  . Knee Pain   KNEE PAIN Duration: 3 weeks Involved knee: left Mechanism of injury: unknown Location:anterior Onset: sudden Severity: severe  Quality:  Sharp and aching Frequency: constant aching and sharp with lifting  Radiation: yes up and down her leg Aggravating factors: weight bearing, walking, running, stairs, bending, movement and prolonged sitting  Alleviating factors: nothing  Status: worse Treatments attempted: rest, ice, heat, APAP, ibuprofen and aleve  Relief with NSAIDs?:  no Weakness with weight bearing or walking: yes Sensation of giving way: yes Locking: no Popping: yes Bruising: no Swelling: yes Redness: no Paresthesias/decreased sensation: yes Fevers: no  Relevant past medical, surgical, family and social history reviewed and updated as indicated. Interim medical history since our last visit reviewed. Allergies and medications reviewed and updated.  Review of Systems  Constitutional: Negative.   Respiratory: Negative.   Cardiovascular: Negative.   Musculoskeletal: Positive for arthralgias, gait problem and joint swelling. Negative for back pain, myalgias, neck pain and neck stiffness.  Psychiatric/Behavioral: Negative.     Per HPI unless specifically indicated above     Objective:    BP (!) 151/85 (BP Location: Left Arm, Patient Position: Sitting, Cuff Size: Large)   Pulse 80   Temp 98.8 F (37.1 C)   Wt 244 lb (110.7 kg)   LMP 07/01/2013 Comment: tubal  SpO2 98%   BMI 41.88 kg/m   Wt Readings from Last 3 Encounters:  03/29/17 244 lb (110.7 kg)    03/03/17 235 lb (106.6 kg)  02/15/17 235 lb (106.6 kg)    Physical Exam  Constitutional: She is oriented to person, place, and time. She appears well-developed and well-nourished. No distress.  HENT:  Head: Normocephalic and atraumatic.  Right Ear: Hearing normal.  Left Ear: Hearing normal.  Nose: Nose normal.  Eyes: Conjunctivae and lids are normal. Right eye exhibits no discharge. Left eye exhibits no discharge. No scleral icterus.  Pulmonary/Chest: Effort normal. No respiratory distress.  Musculoskeletal: She exhibits edema and tenderness. She exhibits no deformity.  Negative Mcmurray's, negative anterior and posterior drawer, negative varus and valgus, negative appley's compression and distraction. + joint effusion with tenderness to palpation of the joint line  Neurological: She is alert and oriented to person, place, and time.  Skin: Skin is warm, dry and intact. No rash noted. She is not diaphoretic. No erythema. No pallor.  Psychiatric: She has a normal mood and affect. Her speech is normal and behavior is normal. Judgment and thought content normal. Cognition and memory are normal.    Results for orders placed or performed during the hospital encounter of 93/81/82  Basic metabolic panel  Result Value Ref Range   Sodium 140 135 - 145 mmol/L   Potassium 3.5 3.5 - 5.1 mmol/L   Chloride 104 101 - 111 mmol/L   CO2 28 22 - 32 mmol/L   Glucose, Bld 110 (H) 65 - 99 mg/dL   BUN 13 6 - 20 mg/dL   Creatinine, Ser 1.04 (H) 0.44 - 1.00  mg/dL   Calcium 9.3 8.9 - 10.3 mg/dL   GFR calc non Af Amer >60 >60 mL/min   GFR calc Af Amer >60 >60 mL/min   Anion gap 8 5 - 15  CBC  Result Value Ref Range   WBC 5.5 3.6 - 11.0 K/uL   RBC 4.33 3.80 - 5.20 MIL/uL   Hemoglobin 13.3 12.0 - 16.0 g/dL   HCT 38.6 35.0 - 47.0 %   MCV 89.1 80.0 - 100.0 fL   MCH 30.8 26.0 - 34.0 pg   MCHC 34.6 32.0 - 36.0 g/dL   RDW 13.1 11.5 - 14.5 %   Platelets 196 150 - 440 K/uL  Troponin I  Result Value Ref  Range   Troponin I <0.03 <0.03 ng/mL  Hepatic function panel  Result Value Ref Range   Total Protein 6.9 6.5 - 8.1 g/dL   Albumin 4.1 3.5 - 5.0 g/dL   AST 24 15 - 41 U/L   ALT 15 14 - 54 U/L   Alkaline Phosphatase 52 38 - 126 U/L   Total Bilirubin 0.7 0.3 - 1.2 mg/dL   Bilirubin, Direct <0.1 (L) 0.1 - 0.5 mg/dL   Indirect Bilirubin NOT CALCULATED 0.3 - 0.9 mg/dL  Lipase, blood  Result Value Ref Range   Lipase 22 11 - 51 U/L      Assessment & Plan:   Problem List Items Addressed This Visit    None    Visit Diagnoses    Chronic pain of both knees    -  Primary   L knee acting up now, R knee previously. Will start prednisone burst and check x-ray. Will call ortho. Consider steroid injection if not improving.    Relevant Medications   predniSONE (DELTASONE) 50 MG tablet   Other Relevant Orders   DG Knee Complete 4 Views Left   DG Knee Complete 4 Views Right   Immunization due       Flu shot given today.   Relevant Orders   Flu Vaccine QUAD 6+ mos PF IM (Fluarix Quad PF) (Completed)       Follow up plan: Return Tuesday, for knee injection.

## 2017-03-30 ENCOUNTER — Telehealth: Payer: Self-pay | Admitting: Family Medicine

## 2017-03-30 NOTE — Telephone Encounter (Signed)
Called patient with her results and LMOM, 1 knee looked OK, one showed a lot of arthritis. Keep to the plan and I'll see her next week.

## 2017-03-30 NOTE — Telephone Encounter (Signed)
Routing to provider  

## 2017-03-30 NOTE — Telephone Encounter (Signed)
Copied from [place] #[id number]. Topic: Quick Communication - See Telephone Encounter >> Mar 30, 2017  4:07 PM Burnis Medin, NT wrote: CRM for notification. See Telephone encounter for:  03/30/17. Pt. Is calling about getting results from her X RAY. Pt. Requesting a call back from doctor or nurse.

## 2017-04-04 ENCOUNTER — Ambulatory Visit: Payer: BC Managed Care – PPO | Admitting: Family Medicine

## 2017-04-04 ENCOUNTER — Encounter: Payer: Self-pay | Admitting: Family Medicine

## 2017-04-04 VITALS — BP 129/82 | HR 84 | Wt 247.0 lb

## 2017-04-04 DIAGNOSIS — G8929 Other chronic pain: Secondary | ICD-10-CM

## 2017-04-04 DIAGNOSIS — M25562 Pain in left knee: Secondary | ICD-10-CM

## 2017-04-04 NOTE — Patient Instructions (Addendum)
Knee Injection, Care After  Refer to this sheet in the next few weeks. These instructions provide you with information about caring for yourself after your procedure. Your health care provider may also give you more specific instructions. Your treatment has been planned according to current medical practices, but problems sometimes occur. Call your health care provider if you have any problems or questions after your procedure.  What can I expect after the procedure?  After the procedure, it is common to have:   Soreness.   Warmth.   Swelling.    You may have more pain, swelling, and warmth than you did before the injection. This reaction may last for about one day.  Follow these instructions at home:  Bathing   If you were given a bandage (dressing), keep it dry until your health care provider says it can be removed. Ask your health care provider when you can start showering or taking a bath.  Managing pain, stiffness, and swelling   If directed, apply ice to the injection area:  ? Put ice in a plastic bag.  ? Place a towel between your skin and the bag.  ? Leave the ice on for 20 minutes, 2-3 times per day.   Do not apply heat to your knee.   Raise the injection area above the level of your heart while you are sitting or lying down.  Activity   Avoid strenuous activities for as long as directed by your health care provider. Ask your health care provider when you can return to your normal activities.  General instructions   Take medicines only as directed by your health care provider.   Do not take aspirin or other over-the-counter medicines unless your health care provider says you can.   Check your injection site every day for signs of infection. Watch for:  ? Redness, swelling, or pain.  ? Fluid, blood, or pus.   Follow your health care provider's instructions about dressing changes and removal.  Contact a health care provider if:   You have symptoms at your injection site that last longer than  two days after your procedure.   You have redness, swelling, or pain in your injection area.   You have fluid, blood, or pus coming from your injection site.   You have warmth in your injection area.   You have a fever.   Your pain is not controlled with medicine.  Get help right away if:   Your knee turns very red.   Your knee becomes very swollen.   Your knee pain is severe.  This information is not intended to replace advice given to you by your health care provider. Make sure you discuss any questions you have with your health care provider.  Document Released: 06/06/2014 Document Revised: 01/20/2016 Document Reviewed: 03/26/2014  Elsevier Interactive Patient Education  2018 [employer]

## 2017-04-04 NOTE — Progress Notes (Signed)
BP 129/82   Pulse 84   Wt 247 lb (112 kg)   LMP 07/01/2013 Comment: tubal  SpO2 95%   BMI 42.40 kg/m    Subjective:    Patient ID: Shirley Rogers, female    DOB: 20-Apr-1966, 51 y.o.   MRN: [id number]  HPI: Shirley Rogers is a 51 y.o. female  Chief Complaint  Patient presents with  . Knee Pain   KNEE PAIN Duration: 4 weeks- no better, in a lot of pain. Making it hard for her to do her job. Would like steroid injection today. Involved knee: left Mechanism of injury: unknown Location:anterior Onset: sudden Severity: severe  Quality:  Sharp and aching Frequency: constant aching and sharp with lifting  Radiation: yes up and down her leg Aggravating factors: weight bearing, walking, running, stairs, bending, movement and prolonged sitting  Alleviating factors: nothing  Status: worse Treatments attempted: rest, ice, heat, APAP, ibuprofen and aleve  Relief with NSAIDs?:  no Weakness with weight bearing or walking: yes Sensation of giving way: yes Locking: no Popping: yes Bruising: no Swelling: yes Redness: no Paresthesias/decreased sensation: yes Fevers: no  Relevant past medical, surgical, family and social history reviewed and updated as indicated. Interim medical history since our last visit reviewed. Allergies and medications reviewed and updated.  Review of Systems  Constitutional: Negative.   Respiratory: Negative.   Cardiovascular: Negative.   Musculoskeletal: Positive for arthralgias, back pain, gait problem, joint swelling and myalgias. Negative for neck pain and neck stiffness.  Psychiatric/Behavioral: Negative.     Per HPI unless specifically indicated above     Objective:    BP 129/82   Pulse 84   Wt 247 lb (112 kg)   LMP 07/01/2013 Comment: tubal  SpO2 95%   BMI 42.40 kg/m   Wt Readings from Last 3 Encounters:  04/04/17 247 lb (112 kg)  03/29/17 244 lb (110.7 kg)  03/03/17 235 lb (106.6 kg)    Physical Exam  Constitutional: She is  oriented to person, place, and time. She appears well-developed and well-nourished. No distress.  HENT:  Head: Normocephalic and atraumatic.  Right Ear: Hearing normal.  Left Ear: Hearing normal.  Nose: Nose normal.  Eyes: Conjunctivae and lids are normal. Right eye exhibits no discharge. Left eye exhibits no discharge. No scleral icterus.  Pulmonary/Chest: Effort normal. No respiratory distress.  Musculoskeletal: She exhibits edema and tenderness.  Negative Mcmurray's, negative anterior and posterior drawer, negative varus and valgus, negative appley's compression and distraction. + joint effusion with tenderness to palpation of the joint line   Neurological: She is alert and oriented to person, place, and time.  Skin: Skin is intact. No rash noted.  Psychiatric: She has a normal mood and affect. Her speech is normal and behavior is normal. Judgment and thought content normal. Cognition and memory are normal.    Results for orders placed or performed during the hospital encounter of 26/94/85  Basic metabolic panel  Result Value Ref Range   Sodium 140 135 - 145 mmol/L   Potassium 3.5 3.5 - 5.1 mmol/L   Chloride 104 101 - 111 mmol/L   CO2 28 22 - 32 mmol/L   Glucose, Bld 110 (H) 65 - 99 mg/dL   BUN 13 6 - 20 mg/dL   Creatinine, Ser 1.04 (H) 0.44 - 1.00 mg/dL   Calcium 9.3 8.9 - 10.3 mg/dL   GFR calc non Af Amer >60 >60 mL/min   GFR calc Af Amer >60 >60 mL/min  Anion gap 8 5 - 15  CBC  Result Value Ref Range   WBC 5.5 3.6 - 11.0 K/uL   RBC 4.33 3.80 - 5.20 MIL/uL   Hemoglobin 13.3 12.0 - 16.0 g/dL   HCT 38.6 35.0 - 47.0 %   MCV 89.1 80.0 - 100.0 fL   MCH 30.8 26.0 - 34.0 pg   MCHC 34.6 32.0 - 36.0 g/dL   RDW 13.1 11.5 - 14.5 %   Platelets 196 150 - 440 K/uL  Troponin I  Result Value Ref Range   Troponin I <0.03 <0.03 ng/mL  Hepatic function panel  Result Value Ref Range   Total Protein 6.9 6.5 - 8.1 g/dL   Albumin 4.1 3.5 - 5.0 g/dL   AST 24 15 - 41 U/L   ALT 15 14 - 54  U/L   Alkaline Phosphatase 52 38 - 126 U/L   Total Bilirubin 0.7 0.3 - 1.2 mg/dL   Bilirubin, Direct <0.1 (L) 0.1 - 0.5 mg/dL   Indirect Bilirubin NOT CALCULATED 0.3 - 0.9 mg/dL  Lipase, blood  Result Value Ref Range   Lipase 22 11 - 51 U/L      Assessment & Plan:   Problem List Items Addressed This Visit    None    Visit Diagnoses    Chronic pain of left knee    -  Primary   Steroid injection given today as below. Continue to monitor. Call with any concerns, if not improving, consider referral to ortho.     Procedure: Left  Knee Intraarticular Steroid Injection        Diagnosis:   ICD-10-CM   1. Chronic pain of left knee M25.562    G89.29    Steroid injection given today as below. Continue to monitor. Call with any concerns, if not improving, consider referral to ortho.    Physician: MJ Consent:  Risks, benefits, and alternative treatments discussed and all questions were answered.  Patient elected to proceed and verbal consent obtained.  Description: Area prepped and draped using  semi-sterile technique.  Using a anterior/lateral approach, a mixture of 4 cc of  1% lidocaine & 1 cc of Kenalog 40 was injected into knee joint.  A bandage was then placed over the injection site Complications: none Post Procedure Instructions: Wound care instructions discussed and patient was instructed to keep area clean and dry.  Signs and symptoms of infection discussed, patient agrees to contact the office ASAP should they occur.    Follow up plan: Return if symptoms worsen or fail to improve.

## 2017-04-16 ENCOUNTER — Other Ambulatory Visit: Payer: Self-pay | Admitting: Family Medicine

## 2017-04-26 ENCOUNTER — Ambulatory Visit: Payer: BC Managed Care – PPO | Admitting: Unknown Physician Specialty

## 2017-04-26 ENCOUNTER — Other Ambulatory Visit: Payer: Self-pay | Admitting: Family Medicine

## 2017-04-26 ENCOUNTER — Other Ambulatory Visit: Payer: Self-pay

## 2017-04-26 ENCOUNTER — Encounter: Payer: Self-pay | Admitting: Unknown Physician Specialty

## 2017-04-26 VITALS — BP 155/98 | HR 90 | Temp 98.2°F | Wt 259.0 lb

## 2017-04-26 DIAGNOSIS — B0230 Zoster ocular disease, unspecified: Secondary | ICD-10-CM

## 2017-04-26 DIAGNOSIS — M1712 Unilateral primary osteoarthritis, left knee: Secondary | ICD-10-CM | POA: Diagnosis not present

## 2017-04-26 DIAGNOSIS — M170 Bilateral primary osteoarthritis of knee: Secondary | ICD-10-CM | POA: Insufficient documentation

## 2017-04-26 MED ORDER — METHAZOLAMIDE 25 MG PO TABS: 25.0000 mg | ORAL_TABLET | Freq: Three times a day (TID) | ORAL | 0 refills | Status: AC

## 2017-04-26 MED ORDER — VALACYCLOVIR HCL 1 G PO TABS: 1000.0000 mg | ORAL_TABLET | Freq: Three times a day (TID) | ORAL | 0 refills | Status: DC

## 2017-04-26 NOTE — Telephone Encounter (Signed)
Patient came in for office visit and asked if she could have a refill on methazolamide sent to [facility]. She states that it was written by a different provider for her headaches.

## 2017-04-26 NOTE — Telephone Encounter (Signed)
Message routed to wrong provider by mistake, will route to correct provider now.

## 2017-04-26 NOTE — Assessment & Plan Note (Addendum)
Worsenting an no help despite cotisone and PT.  Refer to Orthopedics.  Due to multiple allergies, continue with Aleve and Tramadol.

## 2017-04-26 NOTE — Progress Notes (Signed)
BP (!) 155/98   Pulse 90   Temp 98.2 F (36.8 C) (Oral)   Wt 259 lb (117.5 kg)   LMP 07/01/2013 Comment: tubal  SpO2 97%   BMI 44.46 kg/m    Subjective:    Patient ID: Shirley Rogers, female    DOB: [birth year], 51 y.o.   MRN: [id number]  HPI: Shirley Rogers is a 51 y.o. female  Chief Complaint  Patient presents with  . Herpes Zoster    pt states she thinks she may have shingles in the back of her head on the left side. States she her head was itching very bad on Saturday and she has pimple like bumps. States that pain is running into the left side of her face and down the back of her head   . Knee Pain    pt states her left knee is hurting very bad, states Dr. Wynetta Emery gave her a cortisone shot last visit that only lasted for 2 days    Rash Pt states she has several areas on the left side of her head that have come up.  They are painful with burning to painful sensations.  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  She is a Pharmacist, hospital and concerned about contagion  Knee pain  As above.  Left knee pain not helped with cortisone.  X-ray shows OA.  Takes Aleve and Tramadol.  Doing PT exercises.  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 Anxiety    on xanax in the past  . ASCVD (arteriosclerotic cardiovascular disease)   . Barrett esophagus   . 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 Carotid atherosclerosis 2003   on ultrasound  . Colon polyp   . Complicated migraine    with facial drooping  . Concussion    1974,1982,1987,2016,2016  . DDD (degenerative disc disease), lumbar    MRI 2008, L3-4, L5-S1, spondylotic changes, multilevel facet joint hypertrophic changes  . Depression    on paxil, lexapro, cymbalta in the past  . Diverticulosis   . Dizziness due to old head injury   . Family history of adverse reaction to anesthesia    father - slow to wake  . Fatty liver 2013   On CT abdomen/pelvis  .  Fibrocystic breast disease   . GERD (gastroesophageal reflux disease)   . Hemiplegic migraine    daily  . Hemorrhoids   . Hyperlipidemia   . Hypertension    no meds currently.  . IBS (irritable bowel syndrome)   . Insomnia chronic  . Kidney stone 2013   on CT abdomen/pelvis  . 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 Migraine   . Narrowing of intervertebral disc space 2011   C5-6  . Normal cardiac stress test 2009   EF 66%  . Pulmonary emboli ([place])   . Recurrent sinusitis   . Recurrent UTI    question about IC in the past  . Sleep apnea    CPAP machine broken  . Stroke ([place])    2000 - no deficits  . Umbilical hernia 4818   on CT abdomen/pelvis  . Varicose veins    right lower leg  . Vascular spasm ([place])   . Vertigo    from concussive disorder.  seeing neuro 03/23/15     Relevant past medical, surgical, family and social history  reviewed and updated as indicated. Interim medical history since our last visit reviewed. Allergies and medications reviewed and updated.  Review of Systems  Constitutional: Positive for fatigue.  Respiratory: Negative.   Cardiovascular: Negative.   Gastrointestinal: Negative.     Per HPI unless specifically indicated above     Objective:    BP (!) 155/98   Pulse 90   Temp 98.2 F (36.8 C) (Oral)   Wt 259 lb (117.5 kg)   LMP 07/01/2013 Comment: tubal  SpO2 97%   BMI 44.46 kg/m   Wt Readings from Last 3 Encounters:  04/26/17 259 lb (117.5 kg)  04/04/17 247 lb (112 kg)  03/29/17 244 lb (110.7 kg)    Physical Exam  Constitutional: She is oriented to person, place, and time. She appears well-developed and well-nourished. No distress.  HENT:  Head: Normocephalic and atraumatic.  Eyes: Conjunctivae and lids are normal. Right eye exhibits no discharge. Left eye exhibits no discharge. No scleral icterus.  Neck: Normal range of motion. Neck supple. No JVD present. Carotid  bruit is not present.  Cardiovascular: Normal rate, regular rhythm and normal heart sounds.  Pulmonary/Chest: Effort normal and breath sounds normal.  Abdominal: Normal appearance. There is no splenomegaly or hepatomegaly.  Musculoskeletal: Normal range of motion.  Neurological: She is alert and oriented to person, place, and time.  Skin: Skin is warm, dry and intact. No rash noted. No pallor.  3 small vesicular areas on left side of head.    Psychiatric: She has a normal mood and affect. Her behavior is normal. Judgment and thought content normal.    Results for orders placed or performed during the hospital encounter of 22/29/79  Basic metabolic panel  Result Value Ref Range   Sodium 140 135 - 145 mmol/L   Potassium 3.5 3.5 - 5.1 mmol/L   Chloride 104 101 - 111 mmol/L   CO2 28 22 - 32 mmol/L   Glucose, Bld 110 (H) 65 - 99 mg/dL   BUN 13 6 - 20 mg/dL   Creatinine, Ser 1.04 (H) 0.44 - 1.00 mg/dL   Calcium 9.3 8.9 - 10.3 mg/dL   GFR calc non Af Amer >60 >60 mL/min   GFR calc Af Amer >60 >60 mL/min   Anion gap 8 5 - 15  CBC  Result Value Ref Range   WBC 5.5 3.6 - 11.0 K/uL   RBC 4.33 3.80 - 5.20 MIL/uL   Hemoglobin 13.3 12.0 - 16.0 g/dL   HCT 38.6 35.0 - 47.0 %   MCV 89.1 80.0 - 100.0 fL   MCH 30.8 26.0 - 34.0 pg   MCHC 34.6 32.0 - 36.0 g/dL   RDW 13.1 11.5 - 14.5 %   Platelets 196 150 - 440 K/uL  Troponin I  Result Value Ref Range   Troponin I <0.03 <0.03 ng/mL  Hepatic function panel  Result Value Ref Range   Total Protein 6.9 6.5 - 8.1 g/dL   Albumin 4.1 3.5 - 5.0 g/dL   AST 24 15 - 41 U/L   ALT 15 14 - 54 U/L   Alkaline Phosphatase 52 38 - 126 U/L   Total Bilirubin 0.7 0.3 - 1.2 mg/dL   Bilirubin, Direct <0.1 (L) 0.1 - 0.5 mg/dL   Indirect Bilirubin NOT CALCULATED 0.3 - 0.9 mg/dL  Lipase, blood  Result Value Ref Range   Lipase 22 11 - 51 U/L      Assessment & Plan:   Problem List Items Addressed This Visit  Unprioritized   OA (osteoarthritis) of knee      Worsenting an no help despite cotisone and PT.  Refer to Orthopedics.  Due to multiple allergies, continue with Aleve and Tramadol.      Relevant Orders   Ambulatory referral to Orthopedic Surgery    Other Visit Diagnoses    Herpes zoster with ophthalmic complication, unspecified herpes zoster eye disease    -  Primary   New problem.  Valtrex TID.  Pt ed on care.  Out of work until lesions crust.  Appt with Optometry due to location of shingels   Relevant Medications   valACYclovir (VALTREX) 1000 MG tablet       Follow up plan: Return if symptoms worsen or fail to improve.

## 2017-04-26 NOTE — Patient Instructions (Addendum)
Shingles Shingles, which is also known as herpes zoster, is an infection that causes a painful skin rash and fluid-filled blisters. Shingles is not related to genital herpes, which is a sexually transmitted infection. Shingles only develops in people who:  Have had chickenpox.  Have received the chickenpox vaccine. (This is rare.)  What are the causes? Shingles is caused by varicella-zoster virus (VZV). This is the same virus that causes chickenpox. After exposure to VZV, the virus stays in the body in an inactive (dormant) state. Shingles develops if the virus reactivates. This can happen many years after the initial exposure to VZV. It is not known what causes this virus to reactivate. What increases the risk? People who have had chickenpox or received the chickenpox vaccine are at risk for shingles. Infection is more common in people who:  Are older than age 50.  Have a weakened defense (immune) system, such as those with HIV, AIDS, or cancer.  Are taking medicines that weaken the immune system, such as transplant medicines.  Are under great stress.  What are the signs or symptoms? Early symptoms of this condition include itching, tingling, and pain in an area on your skin. Pain may be described as burning, stabbing, or throbbing. A few days or weeks after symptoms start, a painful red rash appears, usually on one side of the body in a bandlike or beltlike pattern. The rash eventually turns into fluid-filled blisters that break open, scab over, and dry up in about 2-3 weeks. At any time during the infection, you may also develop:  A fever.  Chills.  A headache.  An upset stomach.  How is this diagnosed? This condition is diagnosed with a skin exam. Sometimes, skin or fluid samples are taken from the blisters before a diagnosis is made. These samples are examined under a microscope or sent to a lab for testing. How is this treated? There is no specific cure for this condition.  Your health care provider will probably prescribe medicines to help you manage pain, recover more quickly, and avoid long-term problems. Medicines may include:  Antiviral drugs.  Anti-inflammatory drugs.  Pain medicines.  If the area involved is on your face, you may be referred to a specialist, such as an eye doctor (ophthalmologist) or an ear, nose, and throat (ENT) doctor to help you avoid eye problems, chronic pain, or disability. Follow these instructions at home: Medicines  Take medicines only as directed by your health care provider.  Apply an anti-itch or numbing cream to the affected area as directed by your health care provider. Blister and Rash Care  Take a cool bath or apply cool compresses to the area of the rash or blisters as directed by your health care provider. This may help with pain and itching.  Keep your rash covered with a loose bandage (dressing). Wear loose-fitting clothing to help ease the pain of material rubbing against the rash.  Keep your rash and blisters clean with mild soap and cool water or as directed by your health care provider.  Check your rash every day for signs of infection. These include redness, swelling, and pain that lasts or increases.  Do not pick your blisters.  Do not scratch your rash. General instructions  Rest as directed by your health care provider.  Keep all follow-up visits as directed by your health care provider. This is important.  Until your blisters scab over, your infection can cause chickenpox in people who have never had it or been vaccinated   against it. To prevent this from happening, avoid contact with other people, especially: ? Babies. ? Pregnant women. ? Children who have eczema. ? Elderly people who have transplants. ? People who have chronic illnesses, such as leukemia or AIDS. Contact a health care provider if:  Your pain is not relieved with prescribed medicines.  Your pain does not get better after  the rash heals.  Your rash looks infected. Signs of infection include redness, swelling, and pain that lasts or increases. Get help right away if:  The rash is on your face or nose.  You have facial pain, pain around your eye area, or loss of feeling on one side of your face.  You have ear pain or you have ringing in your ear.  You have loss of taste.  Your condition gets worse. This information is not intended to replace advice given to you by your health care provider. Make sure you discuss any questions you have with your health care provider. Document Released: 05/16/2005 Document Revised: 01/10/2016 Document Reviewed: 03/27/2014 [employer] Interactive Patient Education  2017 [employer]  

## 2017-04-27 ENCOUNTER — Ambulatory Visit (INDEPENDENT_AMBULATORY_CARE_PROVIDER_SITE_OTHER): Payer: BC Managed Care – PPO | Admitting: Orthopedic Surgery

## 2017-05-04 ENCOUNTER — Ambulatory Visit (INDEPENDENT_AMBULATORY_CARE_PROVIDER_SITE_OTHER): Payer: BC Managed Care – PPO | Admitting: Orthopaedic Surgery

## 2017-05-04 ENCOUNTER — Encounter (INDEPENDENT_AMBULATORY_CARE_PROVIDER_SITE_OTHER): Payer: Self-pay | Admitting: Orthopaedic Surgery

## 2017-05-04 DIAGNOSIS — M25562 Pain in left knee: Secondary | ICD-10-CM | POA: Diagnosis not present

## 2017-05-04 DIAGNOSIS — G8929 Other chronic pain: Secondary | ICD-10-CM

## 2017-05-04 NOTE — Progress Notes (Signed)
Office Visit Note   Patient: Shirley Rogers           Date of Birth: 12-11-1965           MRN: [id number] Visit Date: 05/04/2017              Requested by: Valerie Roys, DO [place], [place] [id number] PCP: Valerie Roys, DO   Assessment & Plan: Visit Diagnoses:  1. Chronic pain of left knee     Plan: Impression is left knee pain likely medial meniscal tear.  Patient has failed conservative treatment.  MRI to evaluate for medial meniscal tear.  Follow-up after MRI.  Follow-Up Instructions: Return in about 10 days (around 05/14/2017).   Orders:  Orders Placed This Encounter  Procedures  . MR Knee Left w/o contrast   No orders of the defined types were placed in this encounter.     Procedures: No procedures performed   Clinical Data: No additional findings.   Subjective: Chief Complaint  Patient presents with  . Left Knee - Pain    Patient is a 51 year old female who has had left knee pain since the summer [place].  She felt a buckling sensation which caused her knee to collapse after a wave hit her.  She has constant 10 out of 10 pain is worse on the medial side.  She does endorse some mild swelling.  The pain into the knee.  She also endorses start of pain.  She is limping due to the pain.  Denies any numbness and tingling.  She is currently doing physical therapy.  She had an injection in the past with 2 days of relief.    Review of Systems  Constitutional: Negative.   HENT: Negative.   Eyes: Negative.   Respiratory: Negative.   Cardiovascular: Negative.   Endocrine: Negative.   Musculoskeletal: Negative.   Neurological: Negative.   Hematological: Negative.   Psychiatric/Behavioral: Negative.   All other systems reviewed and are negative.    Objective: Vital Signs: LMP 07/01/2013 Comment: tubal  Physical Exam  Constitutional: She is oriented to person, place, and time. She appears well-developed and  well-nourished.  HENT:  Head: Normocephalic and atraumatic.  Eyes: EOM are normal.  Neck: Neck supple.  Pulmonary/Chest: Effort normal.  Abdominal: Soft.  Neurological: She is alert and oriented to person, place, and time.  Skin: Skin is warm. Capillary refill takes less than 2 seconds.  Psychiatric: She has a normal mood and affect. Her behavior is normal. Judgment and thought content normal.  Nursing note and vitals reviewed.   Ortho Exam Left knee exam shows a trace effusion.  Collaterals and cruciates are stable.  MCL is painful to testing and medial joint line tenderness positive [place]. Specialty Comments:  No specialty comments available.  Imaging: No results found.   PMFS History: Patient Active Problem List   Diagnosis Date Noted  . OA (osteoarthritis) of knee 04/26/2017  . ASCUS with positive high risk HPV cervical 01/18/2017  . Increased intracranial pressure 10/06/2016  . Vestibular hypofunction of left ear 09/21/2016  . Intracranial hypertension, benign 09/21/2016  . Chronic daily headache 09/21/2016  . Inflammatory arthritis 08/28/2016  . Nephrolithiasis 07/22/2016  . Dysuria 07/22/2016  . Right sided weakness 06/10/2016  . 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 Behcet's disease ([place]) 04/24/2016  . Microhematuria 03/15/2016  . Pelvic floor dysfunction 03/15/2016  . Urethral caruncle 03/15/2016  . History of pulmonary  embolus (PE) 03/11/2016  . Facial droop 02/04/2016  . Right hemiparesis ([place]) 02/04/2016  . Tobacco use 12/03/2015  . ANA positive 10/19/2015  . Transient alteration of awareness 09/24/2015  . Snoring 06/10/2015  . Sleep apnea 06/10/2015  . 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 Globus sensation 03/30/2015  . Post concussion syndrome 03/13/2015  . Depression 03/13/2015  . Benign hypertensive renal disease   . Barrett esophagus   . Diverticulosis   . Hemiplegic migraine     . Lupus   . History of stroke   . Fall 05/25/2014   Past Medical History:  Diagnosis Date  . Anxiety    on xanax in the past  . ASCVD (arteriosclerotic cardiovascular disease)   . Barrett esophagus   . 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 Carotid atherosclerosis 2003   on ultrasound  . Colon polyp   . Complicated migraine    with facial drooping  . Concussion    1974,1982,1987,2016,2016  . DDD (degenerative disc disease), lumbar    MRI 2008, L3-4, L5-S1, spondylotic changes, multilevel facet joint hypertrophic changes  . Depression    on paxil, lexapro, cymbalta in the past  . Diverticulosis   . Dizziness due to old head injury   . Family history of adverse reaction to anesthesia    father - slow to wake  . Fatty liver 2013   On CT abdomen/pelvis  . Fibrocystic breast disease   . GERD (gastroesophageal reflux disease)   . Hemiplegic migraine    daily  . Hemorrhoids   . Hyperlipidemia   . Hypertension    no meds currently.  . IBS (irritable bowel syndrome)   . Insomnia chronic  . Kidney stone 2013   on CT abdomen/pelvis  . 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 Migraine   . Narrowing of intervertebral disc space 2011   C5-6  . Normal cardiac stress test 2009   EF 66%  . Pulmonary emboli ([place])   . Recurrent sinusitis   . Recurrent UTI    question about IC in the past  . Sleep apnea    CPAP machine broken  . Stroke ([place])    2000 - no deficits  . Umbilical hernia 7616   on CT abdomen/pelvis  . Varicose veins    right lower leg  . Vascular spasm ([place])   . Vertigo    from concussive disorder.  seeing neuro 03/23/15    Family History  Problem Relation Age of Onset  . Other Mother        IBS  . Hypertension Mother   . Heart disease Father   . Diverticulosis Father   . Migraines Father   . Other Son        Ulcerative Colitis, Polonidal cyst  . Parkinson's disease Maternal Grandmother   . Cancer  Maternal Grandmother   . Mental illness Paternal Grandmother   . Stroke Paternal Grandmother   . Cancer Paternal Grandmother        brain cancer  . Bipolar disorder Paternal Grandmother   . Stroke Maternal Grandfather   . Cancer Maternal Grandfather        Bladder, Prostate/bladder/prostate  . Heart disease Paternal Grandfather   . Heart attack Paternal Grandfather   . Macular degeneration Unknown   . Diabetes Brother   . Breast cancer Maternal Aunt  Past Surgical History:  Procedure Laterality Date  . Biopsy Punch Thyroid    . BREAST EXCISIONAL BIOPSY Right 2000   right mass excision - atypical hyperplasia  . BREAST SURGERY Right    atypical hyperplasia  . CHOLECYSTECTOMY N/A 07/24/2015   Procedure: LAPAROSCOPIC CHOLECYSTECTOMY WITH INTRAOPERATIVE CHOLANGIOGRAM;  Surgeon: Leonie Green, MD;  Location: [facility] ORS;  Service: General;  Laterality: N/A;  . COLONOSCOPY W/ BIOPSIES     Removed 5 polyps  . COLONOSCOPY WITH PROPOFOL N/A 03/03/2017   Procedure: COLONOSCOPY WITH PROPOFOL;  Surgeon: [name], MD;  Location: [facility] ENDOSCOPY;  Service: Gastroenterology;  Laterality: N/A;  . 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 Smoking status: Current Every Day Smoker    Types: Cigarettes    Last attempt to quit: 06/12/2015    Years since quitting: 1.8  . Smokeless tobacco: Never Used  . Tobacco comment: 1-2 cigarettes   Substance and Sexual Activity  . Alcohol use: No    Alcohol/week: 0.0 oz    Comment: social  . Drug use: No  . Sexual activity: No    Comment: tubal ligation and menopause

## 2017-05-08 ENCOUNTER — Inpatient Hospital Stay: Admission: RE | Admit: 2017-05-08 | Payer: BC Managed Care – PPO | Source: Ambulatory Visit

## 2017-05-12 ENCOUNTER — Telehealth: Payer: Self-pay | Admitting: Family Medicine

## 2017-05-12 NOTE — Telephone Encounter (Signed)
Copied from [place]. Topic: General - Other >> May 12, 2017 12:00 PM Darl Householder, RMA wrote: Reason for CRM: Medication refill request for Tramadol 50 mg to be sent to [facility]

## 2017-05-13 ENCOUNTER — Ambulatory Visit
Admission: RE | Admit: 2017-05-13 | Discharge: 2017-05-13 | Disposition: A | Discharge status: Home / Self Care | Payer: BC Managed Care – PPO | Source: Ambulatory Visit | Attending: Orthopaedic Surgery | Admitting: Orthopaedic Surgery

## 2017-05-13 DIAGNOSIS — G8929 Other chronic pain: Secondary | ICD-10-CM

## 2017-05-13 DIAGNOSIS — M25562 Pain in left knee: Principal | ICD-10-CM

## 2017-05-15 ENCOUNTER — Other Ambulatory Visit: Payer: Self-pay | Admitting: Family Medicine

## 2017-05-15 MED ORDER — TRAMADOL HCL 50 MG PO TABS: 50.0000 mg | ORAL_TABLET | Freq: Two times a day (BID) | ORAL | 0 refills | Status: DC | PRN

## 2017-05-15 NOTE — Telephone Encounter (Signed)
LVM that Rx was ready.

## 2017-05-15 NOTE — Telephone Encounter (Signed)
Rx up front for her.

## 2017-05-16 ENCOUNTER — Encounter (INDEPENDENT_AMBULATORY_CARE_PROVIDER_SITE_OTHER): Payer: Self-pay | Admitting: Orthopaedic Surgery

## 2017-05-16 ENCOUNTER — Ambulatory Visit (INDEPENDENT_AMBULATORY_CARE_PROVIDER_SITE_OTHER): Payer: BC Managed Care – PPO | Admitting: Orthopaedic Surgery

## 2017-05-16 ENCOUNTER — Telehealth: Payer: Self-pay | Admitting: Family Medicine

## 2017-05-16 DIAGNOSIS — M94262 Chondromalacia, left knee: Secondary | ICD-10-CM | POA: Diagnosis not present

## 2017-05-16 DIAGNOSIS — M84353A Stress fracture, unspecified femur, initial encounter for fracture: Secondary | ICD-10-CM | POA: Insufficient documentation

## 2017-05-16 DIAGNOSIS — M2242 Chondromalacia patellae, left knee: Secondary | ICD-10-CM | POA: Insufficient documentation

## 2017-05-16 DIAGNOSIS — M84352A Stress fracture, left femur, initial encounter for fracture: Secondary | ICD-10-CM

## 2017-05-16 NOTE — Progress Notes (Signed)
Office Visit Note   Patient: Shirley Rogers           Date of Birth: 1966/01/20           MRN: [id number] Visit Date: 05/16/2017              Requested by: Valerie Roys, DO [place], [place] [address] PCP: Valerie Roys, DO   Assessment & Plan: Visit Diagnoses:  1. Stress fracture of left femur, initial encounter   2. Chondromalacia, left knee     Plan: MRI findings are consistent with a stress fracture of the medial femoral condyle with reactive bony edema.  She also has significant chondromalacia of the medial femoral condyle.  Patient has failed conservative treatment and at this point we discussed arthroscopic treatment of the chondromalacia with abrasion arthroplasty and microfracture as indicated with sub-chondroplasty of the medial femoral condyle stress fracture.  We discussed the details of the surgery including the risk benefits alternatives to surgery including DVT.  Patient understands and wished to proceed.  We will obtain instructions from her PCP as to how to bridge her Eliquis for surgery as she has had a PE in the past. Total face to face encounter time was greater than 25 minutes and over half of this time was spent in counseling and/or coordination of care.  Follow-Up Instructions: No Follow-up on file.   Orders:  No orders of the defined types were placed in this encounter.  No orders of the defined types were placed in this encounter.     Procedures: No procedures performed   Clinical Data: No additional findings.   Subjective: Chief Complaint  Patient presents with  . Left Knee - Pain    Patient comes back today for follow-up of her left knee MRI.  Her symptoms.    Review of Systems   Objective: Vital Signs: LMP 07/01/2013 Comment: tubal  Physical Exam  Ortho Exam Left knee exam is stable. Specialty Comments:  No specialty comments available.  Imaging: No results found.   PMFS History: Patient Active Problem List   Diagnosis Date Noted  . Stress fracture of femur 05/16/2017  . Chondromalacia, left knee 05/16/2017  . OA (osteoarthritis) of knee 04/26/2017  . ASCUS with positive high risk HPV cervical 01/18/2017  . Increased intracranial pressure 10/06/2016  . Vestibular hypofunction of left ear 09/21/2016  . Intracranial hypertension, benign 09/21/2016  . Chronic daily headache 09/21/2016  . Inflammatory arthritis 08/28/2016  . Nephrolithiasis 07/22/2016  . Dysuria 07/22/2016  . Right sided weakness 06/10/2016  . 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 Behcet's disease ([place]) 04/24/2016  . Microhematuria 03/15/2016  . Pelvic floor dysfunction 03/15/2016  . Urethral caruncle 03/15/2016  . History of pulmonary embolus (PE) 03/11/2016  . Facial droop 02/04/2016  . Right hemiparesis ([place]) 02/04/2016  . Tobacco use 12/03/2015  . ANA positive 10/19/2015  . Transient alteration of awareness 09/24/2015  . Snoring 06/10/2015  . Sleep apnea 06/10/2015  . 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 Globus sensation 03/30/2015  . Post concussion syndrome 03/13/2015  . Depression 03/13/2015  . Benign hypertensive renal disease   . Barrett esophagus   . Diverticulosis   . Hemiplegic migraine   . Lupus   . History of stroke   . Fall 05/25/2014   Past Medical History:  Diagnosis Date  . Anxiety    on xanax in the past  .  ASCVD (arteriosclerotic cardiovascular disease)   . Barrett esophagus   . 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 Carotid atherosclerosis 2003   on ultrasound  . Colon polyp   . Complicated migraine    with facial drooping  . Concussion    1974,1982,1987,2016,2016  . DDD (degenerative disc disease), lumbar    MRI 2008, L3-4, L5-S1, spondylotic changes, multilevel facet joint hypertrophic changes  . Depression    on paxil, lexapro, cymbalta in the past  . Diverticulosis   . Dizziness due to old head injury   . Family history  of adverse reaction to anesthesia    father - slow to wake  . Fatty liver 2013   On CT abdomen/pelvis  . Fibrocystic breast disease   . GERD (gastroesophageal reflux disease)   . Hemiplegic migraine    daily  . Hemorrhoids   . Hyperlipidemia   . Hypertension    no meds currently.  . IBS (irritable bowel syndrome)   . Insomnia chronic  . Kidney stone 2013   on CT abdomen/pelvis  . 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 Migraine   . Narrowing of intervertebral disc space 2011   C5-6  . Normal cardiac stress test 2009   EF 66%  . Pulmonary emboli ([place])   . Recurrent sinusitis   . Recurrent UTI    question about IC in the past  . Sleep apnea    CPAP machine broken  . Stroke ([place])    2000 - no deficits  . Umbilical hernia 1610   on CT abdomen/pelvis  . Varicose veins    right lower leg  . Vascular spasm ([place])   . Vertigo    from concussive disorder.  seeing neuro 03/23/15    Family History  Problem Relation Age of Onset  . Other Mother        IBS  . Hypertension Mother   . Heart disease Father   . Diverticulosis Father   . Migraines Father   . Other Son        Ulcerative Colitis, Polonidal cyst  . Parkinson's disease Maternal Grandmother   . Cancer Maternal Grandmother   . Mental illness Paternal Grandmother   . Stroke Paternal Grandmother   . Cancer Paternal Grandmother        brain cancer  . Bipolar disorder Paternal Grandmother   . Stroke Maternal Grandfather   . Cancer Maternal Grandfather        Bladder, Prostate/bladder/prostate  . Heart disease Paternal Grandfather   . Heart attack Paternal Grandfather   . Macular degeneration Unknown   . Diabetes Brother   . Breast cancer Maternal Aunt     Past Surgical History:  Procedure Laterality Date  . Biopsy Punch Thyroid    . BREAST EXCISIONAL BIOPSY Right 2000   right mass excision - atypical hyperplasia  . BREAST SURGERY Right    atypical  hyperplasia  . CHOLECYSTECTOMY N/A 07/24/2015   Procedure: LAPAROSCOPIC CHOLECYSTECTOMY WITH INTRAOPERATIVE CHOLANGIOGRAM;  Surgeon: Leonie Green, MD;  Location: [facility] ORS;  Service: General;  Laterality: N/A;  . COLONOSCOPY W/ BIOPSIES     Removed 5 polyps  . COLONOSCOPY WITH PROPOFOL N/A 03/03/2017   Procedure: COLONOSCOPY WITH PROPOFOL;  Surgeon: [name], MD;  Location[facility] END[facility]COPY;  Service: Gastroenterology;  Laterality: N/A;  . 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 Smoking status: Current Every Day Smoker    Types: Cigarettes    Last attempt to quit: 06/12/2015    Years since quitting: 1.9  . Smokeless tobacco: Never Used  . Tobacco comment: 1-2 cigarettes   Substance and Sexual Activity  . Alcohol use: No    Alcohol/week: 0.0 oz    Comment: social  . Drug use: No  . Sexual activity: No    Comment: tubal ligation and menopause

## 2017-05-16 NOTE — Telephone Encounter (Signed)
Rx faxed to [facility] 

## 2017-05-16 NOTE — Telephone Encounter (Signed)
Can we please fax this over

## 2017-05-16 NOTE — Telephone Encounter (Signed)
Copied from [place]. Topic: General - Other >> May 12, 2017 12:00 PM Darl Householder, RMA wrote: Reason for CRM: Medication refill request for Tramadol 50 mg to be sent to [facility]  >> May 15, 2017  4:55 PM Percell Belt A wrote: Pt rooomate called in and said that pt would like this med sent to [facility] in [place]

## 2017-05-18 ENCOUNTER — Ambulatory Visit (INDEPENDENT_AMBULATORY_CARE_PROVIDER_SITE_OTHER): Payer: BC Managed Care – PPO | Admitting: Family Medicine

## 2017-05-18 ENCOUNTER — Encounter: Payer: Self-pay | Admitting: Family Medicine

## 2017-05-18 ENCOUNTER — Other Ambulatory Visit: Payer: Self-pay | Admitting: Family Medicine

## 2017-05-18 VITALS — BP 127/85 | HR 90 | Temp 98.5°F | Wt 258.0 lb

## 2017-05-18 DIAGNOSIS — Z01818 Encounter for other preprocedural examination: Secondary | ICD-10-CM

## 2017-05-18 DIAGNOSIS — Z79899 Other long term (current) drug therapy: Secondary | ICD-10-CM | POA: Diagnosis not present

## 2017-05-18 DIAGNOSIS — Z86711 Personal history of pulmonary embolism: Secondary | ICD-10-CM | POA: Diagnosis not present

## 2017-05-18 MED ORDER — ENOXAPARIN SODIUM 40 MG/0.4ML ~~LOC~~ SOLN: 40.0000 mg | Freq: Two times a day (BID) | SUBCUTANEOUS | 0 refills | Status: DC

## 2017-05-18 NOTE — Assessment & Plan Note (Signed)
Given history of recurrent PEs I am not comfortable taking her off her eliquis. We will have her take her last eliquis the night before her surgery. Will have her start lovenox day of surgery- BID given BMI>40. Will make recommendations to surgery- order for lovenox given. Patient will come in 1 week after surgery for CBC while on the lovenox.

## 2017-05-18 NOTE — Patient Instructions (Addendum)
Take your eliquis the day before surgery. Take your lovenox day of surgery and daily for 2 weeks until your follow up with the surgeon. If everything is healing well, you can take your eliqus the day after your follow up. Do not take lovenox and eliquis at the same time. Come in for a CBC a week after your surgery.

## 2017-05-18 NOTE — Progress Notes (Addendum)
BP 127/85 (BP Location: Left Arm, Patient Position: Sitting, Cuff Size: Large)   Pulse 90   Temp 98.5 F (36.9 C)   Wt 258 lb (117 kg)   LMP 07/01/2013 Comment: tubal  SpO2 95%   BMI 44.29 kg/m    Subjective:    Patient ID: Shirley Rogers, female    DOB: 07-Feb-1966, 51 y.o.   MRN: [id number]  HPI: Shirley Rogers is a 51 y.o. female  Chief Complaint  Patient presents with  . Medical Clearance   Has a stress fracture in her knee. Going to have  Has had tubal ligation and gall bladder  Never had any problems with anesthesia.  No problems with extubation Arthroscopic knee surgery on the L  Relevant past medical, surgical, family and social history reviewed and updated as indicated. Interim medical history since our last visit reviewed. Allergies and medications reviewed and updated.  Review of Systems  Constitutional: Negative.   Respiratory: Negative.   Cardiovascular: Negative.   Musculoskeletal: Positive for arthralgias, gait problem and joint swelling. Negative for back pain, myalgias, neck pain and neck stiffness.  Neurological: Negative for dizziness, tremors, seizures, syncope, facial asymmetry, speech difficulty, weakness, light-headedness, numbness and headaches.  Psychiatric/Behavioral: Negative.     Per HPI unless specifically indicated above     Objective:    BP 127/85 (BP Location: Left Arm, Patient Position: Sitting, Cuff Size: Large)   Pulse 90   Temp 98.5 F (36.9 C)   Wt 258 lb (117 kg)   LMP 07/01/2013 Comment: tubal  SpO2 95%   BMI 44.29 kg/m   Wt Readings from Last 3 Encounters:  05/18/17 258 lb (117 kg)  04/26/17 259 lb (117.5 kg)  04/04/17 247 lb (112 kg)    Physical Exam  Constitutional: She is oriented to person, place, and time. She appears well-developed and well-nourished. No distress.  HENT:  Head: Normocephalic and atraumatic.  Right Ear: Hearing normal.  Left Ear: Hearing normal.  Nose: Nose normal.  Eyes: Conjunctivae and  lids are normal. Right eye exhibits no discharge. Left eye exhibits no discharge. No scleral icterus.  Cardiovascular: Normal rate, regular rhythm, normal heart sounds and intact distal pulses. Exam reveals no gallop and no friction rub.  No murmur heard. Pulmonary/Chest: Effort normal and breath sounds normal. No respiratory distress. She has no wheezes. She has no rales. She exhibits no tenderness.  Musculoskeletal: Normal range of motion.  Neurological: She is alert and oriented to person, place, and time.  Skin: Skin is warm, dry and intact. No rash noted. No erythema. No pallor.  Psychiatric: She has a normal mood and affect. Her speech is normal and behavior is normal. Judgment and thought content normal. Cognition and memory are normal.    Results for orders placed or performed during the hospital encounter of 93/73/42  Basic metabolic panel  Result Value Ref Range   Sodium 140 135 - 145 mmol/L   Potassium 3.5 3.5 - 5.1 mmol/L   Chloride 104 101 - 111 mmol/L   CO2 28 22 - 32 mmol/L   Glucose, Bld 110 (H) 65 - 99 mg/dL   BUN 13 6 - 20 mg/dL   Creatinine, Ser 1.04 (H) 0.44 - 1.00 mg/dL   Calcium 9.3 8.9 - 10.3 mg/dL   GFR calc non Af Amer >60 >60 mL/min   GFR calc Af Amer >60 >60 mL/min   Anion gap 8 5 - 15  CBC  Result Value Ref Range   WBC  5.5 3.6 - 11.0 K/uL   RBC 4.33 3.80 - 5.20 MIL/uL   Hemoglobin 13.3 12.0 - 16.0 g/dL   HCT 38.6 35.0 - 47.0 %   MCV 89.1 80.0 - 100.0 fL   MCH 30.8 26.0 - 34.0 pg   MCHC 34.6 32.0 - 36.0 g/dL   RDW 13.1 11.5 - 14.5 %   Platelets 196 150 - 440 K/uL  Troponin I  Result Value Ref Range   Troponin I <0.03 <0.03 ng/mL  Hepatic function panel  Result Value Ref Range   Total Protein 6.9 6.5 - 8.1 g/dL   Albumin 4.1 3.5 - 5.0 g/dL   AST 24 15 - 41 U/L   ALT 15 14 - 54 U/L   Alkaline Phosphatase 52 38 - 126 U/L   Total Bilirubin 0.7 0.3 - 1.2 mg/dL   Bilirubin, Direct <0.1 (L) 0.1 - 0.5 mg/dL   Indirect Bilirubin NOT CALCULATED 0.3 -  0.9 mg/dL  Lipase, blood  Result Value Ref Range   Lipase 22 11 - 51 U/L      Assessment & Plan:   Problem List Items Addressed This Visit      Other   History of pulmonary embolus (PE)    Given history of recurrent PEs I am not comfortable taking her off her eliquis. We will have her take her last eliquis the night before her surgery. Will have her start lovenox day of surgery- BID given BMI>40. Will make recommendations to surgery- order for lovenox given. Patient will come in 1 week after surgery for CBC while on the lovenox.        Other Visit Diagnoses    Pre-procedural general physical examination    -  Primary   EKG normal. Should be cleared pending lab results tomorrow. Will need to be bridged with lovenox as below.    Relevant Orders   EKG 12-Lead (Completed)   CoaguChek XS/INR Waived   CBC with Differential/Platelet   Comprehensive metabolic panel   Long-term use of high-risk medication       Will check CBC 1 week after surgery while on lovenox.   Relevant Orders   CBC with Differential/Platelet       Follow up plan: Return As scheduled.   Addendum: 05/19/17 2:20PM- CBC, INR and CMP normal. Cleared for surgery from a medical perspective with recommendations as above.

## 2017-05-19 ENCOUNTER — Telehealth (INDEPENDENT_AMBULATORY_CARE_PROVIDER_SITE_OTHER): Payer: Self-pay | Admitting: Orthopaedic Surgery

## 2017-05-19 ENCOUNTER — Other Ambulatory Visit (INDEPENDENT_AMBULATORY_CARE_PROVIDER_SITE_OTHER): Payer: Self-pay | Admitting: Orthopaedic Surgery

## 2017-05-19 DIAGNOSIS — M84352A Stress fracture, left femur, initial encounter for fracture: Secondary | ICD-10-CM

## 2017-05-19 LAB — CBC WITH DIFFERENTIAL/PLATELET

## 2017-05-19 LAB — COMPREHENSIVE METABOLIC PANEL WITH GFR
ALT: 13 IU/L (ref 0–32)
AST: 18 IU/L (ref 0–40)
Albumin/Globulin Ratio: 2 (ref 1.2–2.2)
Albumin: 4.7 g/dL (ref 3.5–5.5)
Alkaline Phosphatase: 61 IU/L (ref 39–117)
BUN/Creatinine Ratio: 24 — ABNORMAL HIGH (ref 9–23)
BUN: 20 mg/dL (ref 6–24)
Bilirubin Total: 0.3 mg/dL (ref 0.0–1.2)
CO2: 30 mmol/L — ABNORMAL HIGH (ref 20–29)
Calcium: 9.4 mg/dL (ref 8.7–10.2)
Chloride: 95 mmol/L — ABNORMAL LOW (ref 96–106)
Creatinine, Ser: 0.83 mg/dL (ref 0.57–1.00)
GFR calc Af Amer: 93 mL/min/1.73
GFR calc non Af Amer: 81 mL/min/1.73
Globulin, Total: 2.3 g/dL (ref 1.5–4.5)
Glucose: 68 mg/dL (ref 65–99)
Potassium: 4.8 mmol/L (ref 3.5–5.2)
Sodium: 140 mmol/L (ref 134–144)
Total Protein: 7 g/dL (ref 6.0–8.5)

## 2017-05-19 LAB — COAGUCHEK XS/INR WAIVED
INR: 1 (ref 0.9–1.1)
Prothrombin Time: 11.9 s

## 2017-05-19 NOTE — Telephone Encounter (Signed)
Patient states they had the pre surgery appt yesterday and she had to have that done before she can have surgery. She would like a knee surgery with Dr. Erlinda Hong if you will give her a call back to schedule # [phone]

## 2017-05-19 NOTE — Telephone Encounter (Signed)
Scheduled patient for surgery on 05-26-17 at [facility].  Clearance was received.  Left voice mail message for patient with time/date and details.

## 2017-05-20 LAB — COMPREHENSIVE METABOLIC PANEL

## 2017-05-20 LAB — CBC WITH DIFFERENTIAL/PLATELET
Basophils Absolute: 0 10*3/uL (ref 0.0–0.2)
Basos: 0 %
EOS (ABSOLUTE): 0.1 10*3/uL (ref 0.0–0.4)
EOS: 2 %
Hematocrit: 39.7 % (ref 34.0–46.6)
Hemoglobin: 13.6 g/dL (ref 11.1–15.9)
IMMATURE GRANS (ABS): 0 10*3/uL (ref 0.0–0.1)
IMMATURE GRANULOCYTES: 0 %
LYMPHS: 27 %
Lymphocytes Absolute: 1.8 10*3/uL (ref 0.7–3.1)
MCH: 31.3 pg (ref 26.6–33.0)
MCHC: 34.3 g/dL (ref 31.5–35.7)
MCV: 91 fL (ref 79–97)
MONOS ABS: 0.6 10*3/uL (ref 0.1–0.9)
Monocytes: 9 %
NEUTROS PCT: 62 %
Neutrophils Absolute: 4.2 10*3/uL (ref 1.4–7.0)
PLATELETS: 251 10*3/uL (ref 150–379)
RBC: 4.35 x10E6/uL (ref 3.77–5.28)
RDW: 13.7 % (ref 12.3–15.4)
WBC: 6.8 10*3/uL (ref 3.4–10.8)

## 2017-05-24 ENCOUNTER — Encounter (HOSPITAL_COMMUNITY): Payer: Self-pay | Admitting: *Deleted

## 2017-05-25 ENCOUNTER — Encounter (HOSPITAL_COMMUNITY): Payer: Self-pay | Admitting: Emergency Medicine

## 2017-05-25 ENCOUNTER — Other Ambulatory Visit: Payer: Self-pay

## 2017-05-25 MED ORDER — CLINDAMYCIN PHOSPHATE 900 MG/50ML IV SOLN
900.0000 mg | INTRAVENOUS | Status: AC
  Administered 2017-05-26: 900 mg via INTRAVENOUS
  Filled 2017-05-25: qty 50

## 2017-05-25 NOTE — Progress Notes (Signed)
Pt denies SOB, chest pain, and being under the care of a cardiologist. Pt denies having a cardiac cath. Pt made aware to stop taking vitamins, fish oil and herbal medications. Do not take any NSAIDs ie: Ibuprofen, Advil, Naproxen (Aleve), Motrin, BC and Goody Powder. Pt stated that she was instructed to take her last dose of Eliquis tonight and start Lovenox injections tomorrow. Pt verbalized understanding of all pre-op instructions. Anesthesia reviewed pt history (see note).

## 2017-05-25 NOTE — Progress Notes (Signed)
Anesthesia chart review:  Patient is a same day work up.  Patient is a 51 year old female scheduled for L knee arthroscopy with subchondroplasty, chondroplasty, synovectomy, possible microfx on 05/26/2017 with Frankey Shown, M.D.  - PCP is Park Liter, DO, who cleared pt for surgery at last office visit 05/18/17.  Per Dr. Wynetta Emery, pt to take last eliquis night before surgery.   - Rheumatologist is Eda Paschal, MD (notes in care everywhere)   - Has seen cardiologist Lujean Amel, MD in the past for pre-op evaluations. Last office visit 07/29/15 (notes in care everywhere)   PMH includes: CAD (I find no documentation of this), HTN, stroke (2000 in [place]), PE (03/10/16), Behcet's syndrome, OSA, hyperlipidemia, fatty liver, GERD. Current smoker. BMI 44  Medications include: Eliquis, Lovenox (postop), Lasix, HCTZ, methazolamide, Prilosec, albuterol, verapamil. Pt to take last dose eliquis the night before surgery and start lovenox post-op day of surgery.   Labs 05/18/17 reviewed.  - CBC And CMET acceptable for surgery - PT/INR will be obtained day of surgery.   CXR 02/15/17: No active cardiopulmonary disease.  EKG 05/18/17: Sinus rhythm   CT angio 02/15/17:  - No evidence of pulmonary embolism. - No evidence of acute cardiopulmonary disease.  Echo 06/11/16:  - Left ventricle: The cavity size was normal. Systolic function was normal. The estimated ejection fraction was in the range of 55% to 60%. - Aortic valve: Valve area (Vmax): 1.89 cm^2.  CT angio head, neck 06/10/16:  - Right carotid system: Patent with unchanged, mild calcified plaque at the carotid bifurcation. No evidence of dissection or significant stenosis. - Left carotid system: Patent with unchanged, mild calcified plaque at the carotid bifurcation. No evidence of dissection or significant stenosis. - Vertebral arteries: No evidence of significant stenosis or dissection, however the left V1 segment is not well evaluated due  to dense contrast and adjacent veins. - No emergent large vessel occlusion or significant stenosis in the head and neck.  Nuclear stress test 07/03/15 (care everywhere):  - Normal myocardial perfusion scan with no evidence of stress-induced myocardial ischemia. EF 61%. Conclusion: Negative scan. Appears to be acceptable surgical risk.  Carotid duplex 03/20/15:  - Minor carotid atherosclerosis. No hemodynamically significant ICA stenosis. Degree of narrowing less than 50% bilaterally.  If PT/INR acceptable day of surgery, I anticipate patient can proceed as scheduled.  Willeen Cass, FNP-BC [facility]/Anesthesiology Phone: [phone] 05/25/2017 10:31 AM

## 2017-05-26 ENCOUNTER — Ambulatory Visit (HOSPITAL_COMMUNITY): Payer: BC Managed Care – PPO

## 2017-05-26 ENCOUNTER — Encounter (HOSPITAL_COMMUNITY)
Admission: RE | Disposition: A | Discharge status: Home / Self Care | Payer: Self-pay | Source: Ambulatory Visit | Attending: Orthopaedic Surgery

## 2017-05-26 ENCOUNTER — Ambulatory Visit (HOSPITAL_COMMUNITY)
Admission: RE | Admit: 2017-05-26 | Discharge: 2017-05-26 | Disposition: A | Discharge status: Home / Self Care | Payer: BC Managed Care – PPO | Source: Ambulatory Visit | Attending: Orthopaedic Surgery | Admitting: Orthopaedic Surgery

## 2017-05-26 ENCOUNTER — Encounter (HOSPITAL_COMMUNITY): Payer: Self-pay | Admitting: *Deleted

## 2017-05-26 ENCOUNTER — Ambulatory Visit (HOSPITAL_COMMUNITY): Payer: BC Managed Care – PPO | Admitting: Emergency Medicine

## 2017-05-26 DIAGNOSIS — Z87440 Personal history of urinary (tract) infections: Secondary | ICD-10-CM | POA: Insufficient documentation

## 2017-05-26 DIAGNOSIS — K219 Gastro-esophageal reflux disease without esophagitis: Secondary | ICD-10-CM | POA: Insufficient documentation

## 2017-05-26 DIAGNOSIS — F17210 Nicotine dependence, cigarettes, uncomplicated: Secondary | ICD-10-CM | POA: Diagnosis not present

## 2017-05-26 DIAGNOSIS — Z8249 Family history of ischemic heart disease and other diseases of the circulatory system: Secondary | ICD-10-CM | POA: Insufficient documentation

## 2017-05-26 DIAGNOSIS — Z87442 Personal history of urinary calculi: Secondary | ICD-10-CM | POA: Diagnosis not present

## 2017-05-26 DIAGNOSIS — G43409 Hemiplegic migraine, not intractable, without status migrainosus: Secondary | ICD-10-CM | POA: Diagnosis not present

## 2017-05-26 DIAGNOSIS — E785 Hyperlipidemia, unspecified: Secondary | ICD-10-CM | POA: Diagnosis not present

## 2017-05-26 DIAGNOSIS — G4733 Obstructive sleep apnea (adult) (pediatric): Secondary | ICD-10-CM | POA: Insufficient documentation

## 2017-05-26 DIAGNOSIS — M797 Fibromyalgia: Secondary | ICD-10-CM | POA: Diagnosis not present

## 2017-05-26 DIAGNOSIS — Z8673 Personal history of transient ischemic attack (TIA), and cerebral infarction without residual deficits: Secondary | ICD-10-CM | POA: Insufficient documentation

## 2017-05-26 DIAGNOSIS — F419 Anxiety disorder, unspecified: Secondary | ICD-10-CM | POA: Diagnosis not present

## 2017-05-26 DIAGNOSIS — Z419 Encounter for procedure for purposes other than remedying health state, unspecified: Secondary | ICD-10-CM

## 2017-05-26 DIAGNOSIS — Z881 Allergy status to other antibiotic agents status: Secondary | ICD-10-CM | POA: Insufficient documentation

## 2017-05-26 DIAGNOSIS — M65862 Other synovitis and tenosynovitis, left lower leg: Secondary | ICD-10-CM | POA: Insufficient documentation

## 2017-05-26 DIAGNOSIS — M329 Systemic lupus erythematosus, unspecified: Secondary | ICD-10-CM | POA: Diagnosis not present

## 2017-05-26 DIAGNOSIS — K760 Fatty (change of) liver, not elsewhere classified: Secondary | ICD-10-CM | POA: Insufficient documentation

## 2017-05-26 DIAGNOSIS — Z882 Allergy status to sulfonamides status: Secondary | ICD-10-CM | POA: Insufficient documentation

## 2017-05-26 DIAGNOSIS — G4700 Insomnia, unspecified: Secondary | ICD-10-CM | POA: Diagnosis not present

## 2017-05-26 DIAGNOSIS — Z888 Allergy status to other drugs, medicaments and biological substances status: Secondary | ICD-10-CM | POA: Insufficient documentation

## 2017-05-26 DIAGNOSIS — Z7901 Long term (current) use of anticoagulants: Secondary | ICD-10-CM | POA: Insufficient documentation

## 2017-05-26 DIAGNOSIS — M2242 Chondromalacia patellae, left knee: Secondary | ICD-10-CM | POA: Insufficient documentation

## 2017-05-26 DIAGNOSIS — M84352A Stress fracture, left femur, initial encounter for fracture: Secondary | ICD-10-CM

## 2017-05-26 DIAGNOSIS — M352 Behcet's disease: Secondary | ICD-10-CM | POA: Insufficient documentation

## 2017-05-26 DIAGNOSIS — F329 Major depressive disorder, single episode, unspecified: Secondary | ICD-10-CM | POA: Diagnosis not present

## 2017-05-26 DIAGNOSIS — K589 Irritable bowel syndrome without diarrhea: Secondary | ICD-10-CM | POA: Diagnosis not present

## 2017-05-26 DIAGNOSIS — Z885 Allergy status to narcotic agent status: Secondary | ICD-10-CM | POA: Diagnosis not present

## 2017-05-26 DIAGNOSIS — Z86711 Personal history of pulmonary embolism: Secondary | ICD-10-CM | POA: Insufficient documentation

## 2017-05-26 DIAGNOSIS — I2510 Atherosclerotic heart disease of native coronary artery without angina pectoris: Secondary | ICD-10-CM | POA: Diagnosis not present

## 2017-05-26 DIAGNOSIS — M94262 Chondromalacia, left knee: Secondary | ICD-10-CM | POA: Diagnosis not present

## 2017-05-26 DIAGNOSIS — M659 Synovitis and tenosynovitis, unspecified: Secondary | ICD-10-CM | POA: Diagnosis not present

## 2017-05-26 DIAGNOSIS — Z6841 Body Mass Index (BMI) 40.0 and over, adult: Secondary | ICD-10-CM | POA: Insufficient documentation

## 2017-05-26 DIAGNOSIS — Z91040 Latex allergy status: Secondary | ICD-10-CM | POA: Insufficient documentation

## 2017-05-26 DIAGNOSIS — I10 Essential (primary) hypertension: Secondary | ICD-10-CM | POA: Diagnosis not present

## 2017-05-26 DIAGNOSIS — I6523 Occlusion and stenosis of bilateral carotid arteries: Secondary | ICD-10-CM | POA: Diagnosis not present

## 2017-05-26 DIAGNOSIS — Z79899 Other long term (current) drug therapy: Secondary | ICD-10-CM | POA: Insufficient documentation

## 2017-05-26 HISTORY — DX: Personal history of urinary calculi: Z87.442

## 2017-05-26 HISTORY — PX: KNEE ARTHROSCOPY WITH SUBCHONDROPLASTY: SHX6732

## 2017-05-26 HISTORY — DX: Other injury of unspecified body region, initial encounter: T14.8XXA

## 2017-05-26 HISTORY — DX: Fibromyalgia: M79.7

## 2017-05-26 HISTORY — DX: Pneumonia, unspecified organism: J18.9

## 2017-05-26 SURGERY — ARTHROSCOPY, KNEE, WITH SUBCHONDROPLASTY
Anesthesia: General | Laterality: Left

## 2017-05-26 MED ORDER — ONDANSETRON HCL 4 MG/2ML IJ SOLN
INTRAMUSCULAR | Status: DC | PRN
  Administered 2017-05-26: 4 mg via INTRAVENOUS

## 2017-05-26 MED ORDER — BUPIVACAINE HCL 0.25 % IJ SOLN
INTRAMUSCULAR | Status: DC | PRN
  Administered 2017-05-26: 20 mL

## 2017-05-26 MED ORDER — FENTANYL CITRATE (PF) 250 MCG/5ML IJ SOLN
INTRAMUSCULAR | Status: AC
  Filled 2017-05-26: qty 5

## 2017-05-26 MED ORDER — CALCIUM CARBONATE-VITAMIN D 500-200 MG-UNIT PO TABS: 1.0000 | ORAL_TABLET | Freq: Three times a day (TID) | ORAL | 12 refills | Status: AC

## 2017-05-26 MED ORDER — SENNOSIDES-DOCUSATE SODIUM 8.6-50 MG PO TABS: 1.0000 | ORAL_TABLET | Freq: Every evening | ORAL | 1 refills | Status: DC | PRN

## 2017-05-26 MED ORDER — ROCURONIUM BROMIDE 10 MG/ML (PF) SYRINGE
PREFILLED_SYRINGE | INTRAVENOUS | Status: DC | PRN
  Administered 2017-05-26: 60 mg via INTRAVENOUS

## 2017-05-26 MED ORDER — MIDAZOLAM HCL 5 MG/5ML IJ SOLN
INTRAMUSCULAR | Status: DC | PRN
  Administered 2017-05-26: 2 mg via INTRAVENOUS

## 2017-05-26 MED ORDER — BUPIVACAINE HCL (PF) 0.25 % IJ SOLN
INTRAMUSCULAR | Status: AC
  Filled 2017-05-26: qty 30

## 2017-05-26 MED ORDER — PROPOFOL 10 MG/ML IV BOLUS
INTRAVENOUS | Status: AC
Start: 2017-05-26 — End: ?
  Filled 2017-05-26: qty 40

## 2017-05-26 MED ORDER — PROPOFOL 10 MG/ML IV BOLUS
INTRAVENOUS | Status: DC | PRN
  Administered 2017-05-26: 140 mg via INTRAVENOUS
  Administered 2017-05-26: 30 mg via INTRAVENOUS

## 2017-05-26 MED ORDER — OXYCODONE-ACETAMINOPHEN 5-325 MG PO TABS: 1.0000 | ORAL_TABLET | ORAL | 0 refills | Status: DC | PRN

## 2017-05-26 MED ORDER — OXYCODONE-ACETAMINOPHEN 5-325 MG PO TABS
ORAL_TABLET | ORAL | Status: AC
  Filled 2017-05-26: qty 2

## 2017-05-26 MED ORDER — HYDROMORPHONE HCL 1 MG/ML IJ SOLN
INTRAMUSCULAR | Status: AC
  Administered 2017-05-26: 0.5 mg via INTRAVENOUS
  Filled 2017-05-26: qty 1

## 2017-05-26 MED ORDER — OXYCODONE HCL 5 MG PO TABS: 5.0000 mg | ORAL_TABLET | Freq: Once | ORAL | Status: DC | PRN

## 2017-05-26 MED ORDER — PHENYLEPHRINE HCL 10 MG/ML IJ SOLN
INTRAVENOUS | Status: DC | PRN
  Administered 2017-05-26: 15 ug/min via INTRAVENOUS

## 2017-05-26 MED ORDER — LACTATED RINGERS IV SOLN
INTRAVENOUS | Status: DC | PRN
  Administered 2017-05-26: 14:00:00 via INTRAVENOUS

## 2017-05-26 MED ORDER — SUGAMMADEX SODIUM 500 MG/5ML IV SOLN
INTRAVENOUS | Status: DC | PRN
  Administered 2017-05-26: 250 mg via INTRAVENOUS

## 2017-05-26 MED ORDER — OXYCODONE-ACETAMINOPHEN 5-325 MG PO TABS
2.0000 | ORAL_TABLET | Freq: Once | ORAL | Status: AC
  Administered 2017-05-26: 2 via ORAL

## 2017-05-26 MED ORDER — FENTANYL CITRATE (PF) 100 MCG/2ML IJ SOLN
INTRAMUSCULAR | Status: DC | PRN
  Administered 2017-05-26: 50 ug via INTRAVENOUS
  Administered 2017-05-26: 100 ug via INTRAVENOUS

## 2017-05-26 MED ORDER — HYDROMORPHONE HCL 1 MG/ML IJ SOLN
0.2500 mg | INTRAMUSCULAR | Status: DC | PRN
  Administered 2017-05-26 (×4): 0.5 mg via INTRAVENOUS

## 2017-05-26 MED ORDER — MIDAZOLAM HCL 2 MG/2ML IJ SOLN
INTRAMUSCULAR | Status: AC
  Filled 2017-05-26: qty 2

## 2017-05-26 MED ORDER — OXYCODONE HCL 5 MG/5ML PO SOLN: 5.0000 mg | Freq: Once | ORAL | Status: DC | PRN

## 2017-05-26 MED ORDER — LIDOCAINE 2% (20 MG/ML) 5 ML SYRINGE
INTRAMUSCULAR | Status: DC | PRN
  Administered 2017-05-26: 60 mg via INTRAVENOUS

## 2017-05-26 MED ORDER — SODIUM CHLORIDE 0.9 % IR SOLN
Status: DC | PRN
  Administered 2017-05-26: 3000 mL

## 2017-05-26 MED ORDER — DEXAMETHASONE SODIUM PHOSPHATE 10 MG/ML IJ SOLN
INTRAMUSCULAR | Status: DC | PRN
  Administered 2017-05-26: 10 mg via INTRAVENOUS

## 2017-05-26 MED ORDER — ONDANSETRON HCL 4 MG PO TABS: 4.0000 mg | ORAL_TABLET | Freq: Three times a day (TID) | ORAL | 0 refills | Status: DC | PRN

## 2017-05-26 MED ORDER — PHENYLEPHRINE 40 MCG/ML (10ML) SYRINGE FOR IV PUSH (FOR BLOOD PRESSURE SUPPORT)
PREFILLED_SYRINGE | INTRAVENOUS | Status: DC | PRN
  Administered 2017-05-26: 80 ug via INTRAVENOUS

## 2017-05-26 SURGICAL SUPPLY — 44 items
BANDAGE ACE 6X5 VEL STRL LF (GAUZE/BANDAGES/DRESSINGS) ×2 IMPLANT
BANDAGE ELASTIC 6 VELCRO ST LF (GAUZE/BANDAGES/DRESSINGS) ×2 IMPLANT
BANDAGE ESMARK 6X9 LF (GAUZE/BANDAGES/DRESSINGS) ×1 IMPLANT
BLADE CLIPPER SURG (BLADE) IMPLANT
BLADE CUDA 5.5 (BLADE) IMPLANT
BLADE CUTTER GATOR 3.5 (BLADE) ×2 IMPLANT
BLADE GREAT WHITE 4.2 (BLADE) IMPLANT
BLADE SURG 11 STRL SS (BLADE) IMPLANT
BNDG ESMARK 6X9 LF (GAUZE/BANDAGES/DRESSINGS) ×2
BUR OVAL 6.0 (BURR) IMPLANT
COVER SURGICAL LIGHT HANDLE (MISCELLANEOUS) ×2 IMPLANT
CUFF TOURNIQUET SINGLE 34IN LL (TOURNIQUET CUFF) IMPLANT
CUFF TOURNIQUET SINGLE 44IN (TOURNIQUET CUFF) IMPLANT
DRAPE ARTHROSCOPY W/POUCH 114 (DRAPES) ×2 IMPLANT
DRAPE SURG 17X23 STRL (DRAPES) ×4 IMPLANT
DRAPE U-SHAPE 47X51 STRL (DRAPES) ×2 IMPLANT
DRSG PAD ABDOMINAL 8X10 ST (GAUZE/BANDAGES/DRESSINGS) IMPLANT
DURAPREP 26ML APPLICATOR (WOUND CARE) ×2 IMPLANT
ELECT CAUTERY BLADE 6.4 (BLADE) IMPLANT
FACESHIELD WRAPAROUND (MASK) ×2 IMPLANT
GAUZE SPONGE 4X4 12PLY STRL (GAUZE/BANDAGES/DRESSINGS) ×2 IMPLANT
GAUZE SPONGE 4X4 12PLY STRL LF (GAUZE/BANDAGES/DRESSINGS) ×2 IMPLANT
GAUZE XEROFORM 1X8 LF (GAUZE/BANDAGES/DRESSINGS) ×2 IMPLANT
GLOVE SKINSENSE NS SZ7.5 (GLOVE) ×2
GLOVE SKINSENSE STRL SZ7.5 (GLOVE) ×2 IMPLANT
GOWN STRL REIN XL XLG (GOWN DISPOSABLE) ×2 IMPLANT
KIT MIXER ACCUMIX (KITS) ×2 IMPLANT
KIT ROOM TURNOVER OR (KITS) ×2 IMPLANT
KNEE KIT SCP W/SIDE ACCUPORT (Joint) ×2 IMPLANT
MANIFOLD NEPTUNE II (INSTRUMENTS) IMPLANT
NS IRRIG 1000ML POUR BTL (IV SOLUTION) IMPLANT
PACK ARTHROSCOPY DSU (CUSTOM PROCEDURE TRAY) ×2 IMPLANT
PAD ARMBOARD 7.5X6 YLW CONV (MISCELLANEOUS) ×4 IMPLANT
PAD CAST 4YDX4 CTTN HI CHSV (CAST SUPPLIES) ×1 IMPLANT
PADDING CAST COTTON 4X4 STRL (CAST SUPPLIES) ×1
PADDING CAST COTTON 6X4 STRL (CAST SUPPLIES) ×2 IMPLANT
SET ARTHROSCOPY TUBING (MISCELLANEOUS) ×1
SET ARTHROSCOPY TUBING LN (MISCELLANEOUS) ×1 IMPLANT
SPONGE LAP 4X18 X RAY DECT (DISPOSABLE) ×2 IMPLANT
SUT ETHILON 3 0 PS 1 (SUTURE) ×2 IMPLANT
TOWEL OR 17X24 6PK STRL BLUE (TOWEL DISPOSABLE) ×2 IMPLANT
TOWEL OR 17X26 10 PK STRL BLUE (TOWEL DISPOSABLE) ×2 IMPLANT
WAND HAND CNTRL MULTIVAC 90 (MISCELLANEOUS) IMPLANT
WATER STERILE IRR 1000ML POUR (IV SOLUTION) ×2 IMPLANT

## 2017-05-26 NOTE — Discharge Instructions (Signed)
° ° °Post-operative patient instructions  °Knee Arthroscopy  ° °• Ice:  Place intermittent ice or cooler pack over your knee, 30 minutes on and 30 minutes off.  Continue this for the first 72 hours after surgery, then save ice for use after therapy sessions or on more active days.   °• Weight:  You may bear weight on your leg as your symptoms allow. °• Crutches:  Use crutches (or walker) to assist in walking until told to discontinue by your physical therapist or physician. This will help to reduce pain. °• Strengthening:  Perform simple thigh squeezes (isometric quad contractions) and straight leg lifts as you are able (3 sets of 5 to 10 repetitions, 3 times a day).  For the leg lifts, have someone support under your ankle in the beginning until you have increased strength enough to do this on your own.  To help get started on thigh squeezes, place a pillow under your knee and push down on the pillow with back of knee (sometimes easier to do than with your leg fully straight). °• Motion:  Perform gentle knee motion as tolerated - this is gentle bending and straightening of the knee. Seated heel slides: you can start by sitting in a chair, remove your brace, and gently slide your heel back on the floor - allowing your knee to bend. Have someone help you straighten your knee (or use your other leg/foot hooked under your ankle.  °• Dressing:  Perform 1st dressing change at 2 days postoperative. A moderate amount of blood tinged drainage is to be expected.  So if you bleed through the dressing on the first or second day or if you have fevers, it is fine to change the dressing/check the wounds early and redress wound. Elevate your leg.  If it bleeds through again, or if the incisions are leaking frank blood, please call the office. May change dressing every 1-2 days thereafter to help watch wounds. Can purchase Tegaderm (or 3M Nexcare) water resistant dressings at local pharmacy / [employer]. °• Shower:  Light shower is  ok after 2 days.  Please take shower, NO bath. Recover with gauze and ace wrap to help keep wounds protected.   °• Pain medication:  A narcotic pain medication has been prescribed.  Take as directed.  Typically you need narcotic pain medication more regularly during the first 3 to 5 days after surgery.  Decrease your use of the medication as the pain improves.  Narcotics can sometimes cause constipation, even after a few doses.  If you have problems with constipation, you can take an over the counter stool softener or light laxative.  If you have persistent problems, please notify your physician’s office. °• Physical therapy: Additional activity guidelines to be provided by your physician or physical therapist at follow-up visits.  °• Driving: Do not recommend driving x 2 weeks post surgical, especially if surgery performed on right side. Should not drive while taking narcotic pain medications. It typically takes at least 2 weeks to restore sufficient neuromuscular function for normal reaction times for driving safety.  °• Call [phone] for questions or problems. Evenings you will be forwarded to the hospital operator.  Ask for the orthopaedic physician on call. Please call if you experience:  °  °o Redness, foul smelling, or persistent drainage from the surgical site  °o worsening knee pain and swelling not responsive to medication  °o any calf pain and or swelling of the lower leg  °o temperatures greater than   101.5 F o other questions or concerns   Thank you for allowing us to be a part of your care.  

## 2017-05-26 NOTE — Anesthesia Procedure Notes (Signed)
Procedure Name: Intubation Date/Time: 05/26/2017 2:57 PM Performed by: Colin Benton, CRNA Pre-anesthesia Checklist: Patient identified, Emergency Drugs available, Suction available and Patient being monitored Patient Re-evaluated:Patient Re-evaluated prior to induction Oxygen Delivery Method: Circle system utilized Preoxygenation: Pre-oxygenation with 100% oxygen Induction Type: IV induction Ventilation: Mask ventilation without difficulty and Oral airway inserted - appropriate to patient size Laryngoscope Size: Mac and 3 Grade View: Grade I Tube type: Oral Tube size: 7.0 mm Number of attempts: 1 Airway Equipment and Method: Stylet Placement Confirmation: ETT inserted through vocal cords under direct vision,  positive ETCO2 and breath sounds checked- equal and bilateral Secured at: 23 cm Tube secured with: Tape Dental Injury: Teeth and Oropharynx as per pre-operative assessment

## 2017-05-26 NOTE — H&P (Signed)
PREOPERATIVE H&P  Chief Complaint: left knee stress fracture, chondromalacia  HPI: Shirley Rogers is a 51 y.o. female who presents for surgical treatment of left knee stress fracture, chondromalacia.  She denies any changes in medical history.  Past Medical History:  Diagnosis Date  . Anxiety    on xanax in the past  . ASCVD (arteriosclerotic cardiovascular disease)    carotids  . Barrett esophagus   . 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 Carotid atherosclerosis 2003   on ultrasound  . Colon polyp   . Complicated migraine    with facial drooping  . Concussion    1974,1982,1987,2016,2016  . DDD (degenerative disc disease), lumbar    MRI 2008, L3-4, L5-S1, spondylotic changes, multilevel facet joint hypertrophic changes  . Depression    on paxil, lexapro, cymbalta in the past  . Diverticulosis   . Dizziness due to old head injury   . Family history of adverse reaction to anesthesia    father - slow to wake  . Fatty liver 2013   On CT abdomen/pelvis  . Fibrocystic breast disease   . Fibromyalgia   . Fracture    left knee medial femoral condyle stress fracture, chondromalacia  . GERD (gastroesophageal reflux disease)   . Hemiplegic migraine    daily  . Hemorrhoids   . History of kidney stones   . Hyperlipidemia   . Hypertension    no meds currently.  . IBS (irritable bowel syndrome)   . Insomnia chronic  . Kidney stone 2013   on CT abdomen/pelvis  . 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 Migraine   . Narrowing of intervertebral disc space 2011   C5-6  . Normal cardiac stress test 2009   EF 66%  . Pneumonia   . Pulmonary emboli ([place])   . Recurrent sinusitis   . Recurrent UTI    question about IC in the past  . Sleep apnea    CPAP machine broken  . Stroke ([place])    2000 - no deficits  . Umbilical hernia 9528   on CT abdomen/pelvis  . Varicose veins    right lower leg  . Vascular spasm ([place])   .  Vertigo    from concussive disorder.  seeing neuro 03/23/15   Past Surgical History:  Procedure Laterality Date  . Biopsy Punch Thyroid    . BREAST EXCISIONAL BIOPSY Right 2000   right mass excision - atypical hyperplasia  . BREAST SURGERY Right    atypical hyperplasia  . CHOLECYSTECTOMY N/A 07/24/2015   Procedure: LAPAROSCOPIC CHOLECYSTECTOMY WITH INTRAOPERATIVE CHOLANGIOGRAM;  Surgeon: Leonie Green, MD;  Location: [facility] ORS;  Service: General;  Laterality: N/A;  . COLONOSCOPY W/ BIOPSIES     Removed 5 polyps  . COLONOSCOPY WITH PROPOFOL N/A 03/03/2017   Procedure: COLONOSCOPY WITH PROPOFOL;  Surgeon: [name], MD;  [facility] ENDOSCOPY;  Service: Gastroenterology;  Laterality: N/A;  . TUBAL LIGATION     Social History   Socioeconomic History  . Marital status: Divorced    Spouse name: None  . Number of children: 1  . Years of education: 84  . Highest education level: None  Social Needs  . Financial resource strain: None  . Food insecurity - worry: None  . Food insecurity - inability: None  . Transportation needs - medical: None  . 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 Smoking status: Current Some Day Smoker    Types: Cigarettes    Last attempt to quit: 06/12/2015    Years since quitting: 1.9  . Smokeless tobacco: Never Used  . Tobacco comment: 1-2 cigarettes some days  Substance and Sexual Activity  . Alcohol use: No    Alcohol/week: 0.0 oz  . Drug use: No  . Sexual activity: No    Comment: tubal ligation and menopause  Other Topics Concern  . None  Social History Narrative   Lives at home with friend   Caffeine use- coffee 1 cup daily   Family History  Problem Relation Age of Onset  . Other Mother        IBS  . Hypertension Mother   . Heart disease Father   . Diverticulosis Father   . Migraines Father   . Other Son        Ulcerative Colitis, Polonidal cyst  .  Parkinson's disease Maternal Grandmother   . Cancer Maternal Grandmother   . Mental illness Paternal Grandmother   . Stroke Paternal Grandmother   . Cancer Paternal Grandmother        brain cancer  . Bipolar disorder Paternal Grandmother   . Stroke Maternal Grandfather   . Cancer Maternal Grandfather        Bladder, Prostate/bladder/prostate  . Heart disease Paternal Grandfather   . Heart attack Paternal Grandfather   . Macular degeneration Unknown   . Diabetes Brother   . Breast cancer Maternal Aunt    Allergies  Allergen Reactions  . Rocephin [Ceftriaxone] Anaphylaxis    Rocephin - throat swelling, rash  . Statins Anaphylaxis, Diarrhea and Nausea And Vomiting  . Compazine [Prochlorperazine] Other (See Comments)    AKATHESIA  . Erythromycin Itching, Nausea And Vomiting and Rash    Patient states she is allergic to all mycin drugs  . Lidocaine Swelling    SWELLING REACTION UNSPECIFIED   . Meloxicam     UNSPECIFIED REACTION   . Ace Inhibitors Itching  . Biaxin [Clarithromycin] Itching, Nausea And Vomiting and Rash  . Doxycycline Nausea And Vomiting  . Latex Rash  . Morphine And Related Nausea And Vomiting  . Sulfa Antibiotics Diarrhea and Nausea And Vomiting  . Sulfamethoxazole-Trimethoprim Diarrhea and Nausea And Vomiting    SULFONAMIDE ANTIBIOTICS    Prior to Admission medications   Medication Sig Start Date End Date Taking? Authorizing Provider  apixaban (ELIQUIS) 5 MG TABS tablet Take 1 tablet (5 mg total) by mouth 2 (two) times daily. 12/13/16  Yes Johnson, Megan P, DO  azaTHIOprine (IMURAN) 50 MG tablet Take 50 mg by mouth 2 (two) times daily. 04/25/16  Yes [provider]  b complex vitamins tablet Take 1 tablet by mouth daily.   Yes [provider]  buPROPion (WELLBUTRIN SR) 150 MG 12 hr tablet Take 150 mg by mouth 3 (three) times daily.   Yes [provider]  diphenhydrAMINE (BENADRYL) 25 mg capsule Take 2 capsules (50 mg total) by mouth  every 6 (six) hours as needed. Patient taking differently: Take 50 mg by mouth every 6 (six) hours as needed (for itching/allergies.).  08/02/16  Yes Johnson, Megan P, DO  DULoxetine (CYMBALTA) 60 MG capsule TAKE 1 CAPSULE(60 MG) BY MOUTH DAILY Patient taking differently: TAKE 1 CAPSULE(60 MG) BY MOUTH DAILY AT NIGHT 02/17/17  Yes Johnson, Megan P, DO  EPINEPHRINE 0.3 mg/0.3 mL IJ SOAJ injection INJECT 0.3 ML( 0.3 MG) INTO  THE MUSCLE ONCE 12/05/16  Yes Johnson, Megan P, DO  furosemide (LASIX) 20 MG tablet TAKE 1 TABLET(20 MG) BY MOUTH DAILY Patient taking differently: TAKE 1 TABLET(20 MG) BY MOUTH DAILY AT NIGHT 02/27/17  Yes Johnson, Megan P, DO  hydrochlorothiazide (HYDRODIURIL) 25 MG tablet TAKE 1 TABLET(25 MG) BY MOUTH DAILY Patient taking differently: TAKE 1 TABLET(25 MG) BY MOUTH DAILY AT NIGHT 02/17/17  Yes Johnson, Megan P, DO  Magnesium 250 MG TABS Take 1,000 mg by mouth daily.   Yes [provider]  methazolamide (NEPTAZANE) 25 MG tablet Take 1 tablet (25 mg total) by mouth 3 (three) times daily. 04/26/17  Yes Volney American, PA-C  omeprazole (PRILOSEC) 20 MG capsule Take 20 mg by mouth at bedtime.   Yes [provider]  pregabalin (LYRICA) 75 MG capsule Take 75 mg by mouth at bedtime.   Yes [provider]  PROAIR HFA 108 (90 Base) MCG/ACT inhaler INHALE 2 PUFFS BY MOUTH EVERY 4 HOURS AS NEEDED FOR WHEEZING 09/26/16  Yes Johnson, Megan P, DO  traMADol (ULTRAM) 50 MG tablet Take 1 tablet (50 mg total) by mouth 2 (two) times daily as needed. Patient taking differently: Take 50 mg by mouth 2 (two) times daily as needed (for pain.).  05/15/17  Yes Johnson, Megan P, DO  traZODone (DESYREL) 100 MG tablet TAKE 1 TABLET(100 MG) BY MOUTH AT BEDTIME AS NEEDED FOR SLEEP Patient taking differently: TAKE 1 TABLET(100 MG) BY MOUTH AT BEDTIME 04/17/17  Yes Johnson, Megan P, DO  verapamil (CALAN-SR) 240 MG CR tablet Take 1 tablet (240 mg total) by mouth daily. Patient taking  differently: Take 240 mg by mouth at bedtime.  08/02/16  Yes Johnson, Megan P, DO  busPIRone (BUSPAR) 15 MG tablet Take 1 tablet (15 mg total) by mouth 3 (three) times daily as needed. Patient not taking: Reported on 05/22/2017 12/13/16   Valerie Roys, DO  Elastic Bandages & Supports (T.E.D. THIGH LENGTH/L-REGULAR) MISC 2 application by Does not apply route every morning. 03/18/16   Merlyn Lot, MD  enoxaparin (LOVENOX) 40 MG/0.4ML injection Inject 0.4 mLs (40 mg total) into the skin every 12 (twelve) hours. 05/18/17   Johnson, Megan P, DO  predniSONE (DELTASONE) 50 MG tablet Take 1 tablet (50 mg total) by mouth daily with breakfast. Patient not taking: Reported on 04/26/2017 03/29/17   Park Liter P, DO  valACYclovir (VALTREX) 1000 MG tablet Take 1 tablet (1,000 mg total) by mouth 3 (three) times daily. Patient not taking: Reported on 05/22/2017 04/26/17   Kathrine Haddock, NP     Positive ROS: All other systems have been reviewed and were otherwise negative with the exception of those mentioned in the HPI and as above.  Physical Exam: General: Alert, no acute distress Cardiovascular: No pedal edema Respiratory: No cyanosis, no use of accessory musculature GI: abdomen soft Skin: No lesions in the area of chief complaint Neurologic: Sensation intact distally Psychiatric: Patient is competent for consent with normal mood and affect Lymphatic: no lymphedema  MUSCULOSKELETAL: exam stable  Assessment: left knee stress fracture, chondromalacia  Plan: Plan for Procedure(s): LEFT KNEE ARTHROSCOPY WITH SUBCHONDROPLASTY, CHONDROPLASTY, SYNOVECTOMY, POSSIBLE MICROFX  The risks benefits and alternatives were discussed with the patient including but not limited to the risks of nonoperative treatment, versus surgical intervention including infection, bleeding, nerve injury,  blood clots, cardiopulmonary complications, morbidity, mortality, among others, and they were willing to proceed.     Eduard Roux, MD   05/26/2017 12:16 PM

## 2017-05-26 NOTE — Anesthesia Preprocedure Evaluation (Addendum)
Anesthesia Evaluation  Patient identified by MRN, date of birth, ID band Patient awake    Reviewed: Allergy & Precautions, NPO status , Patient's Chart, lab work & pertinent test results  History of Anesthesia Complications (+) Family history of anesthesia reactionNegative for: history of anesthetic complications  Airway Mallampati: III  TM Distance: >3 FB Neck ROM: Full    Dental  (+) Partial Lower, Loose,    Pulmonary sleep apnea and Continuous Positive Airway Pressure Ventilation , neg COPD, Current Smoker, former smoker,    breath sounds clear to auscultation       Cardiovascular hypertension, Pt. on medications (-) Past MI and (-) CHF (-) dysrhythmias (-) Valvular Problems/Murmurs Rhythm:Regular  ECG: SR, rate 84   Neuro/Psych  Headaches, PSYCHIATRIC DISORDERS Anxiety Depression Vertigo, CVA like event 2001  due to migraine  Neuromuscular disease CVA, No Residual Symptoms    GI/Hepatic Neg liver ROS, GERD  Medicated and Poorly Controlled,IBS (irritable bowel syndrome)   Endo/Other  neg diabetesMorbid obesityLupus  Renal/GU Renal disease (stones)     Musculoskeletal  (+) Arthritis , Fibromyalgia -  Abdominal   Peds  Hematology negative hematology ROS (+)   Anesthesia Other Findings left knee stress fracture chondromalacia  Reproductive/Obstetrics                           Anesthesia Physical  Anesthesia Plan  ASA: III  Anesthesia Plan: General   Post-op Pain Management:    Induction: Intravenous  PONV Risk Score and Plan: 3 and Ondansetron, Dexamethasone and Midazolam  Airway Management Planned: Oral ETT  Additional Equipment: None  Intra-op Plan:   Post-operative Plan: Extubation in OR  Informed Consent: I have reviewed the patients History and Physical, chart, labs and discussed the procedure including the risks, benefits and alternatives for the proposed anesthesia with  the patient or authorized representative who has indicated his/her understanding and acceptance.   Dental advisory given  Plan Discussed with: CRNA and Surgeon  Anesthesia Plan Comments:        Anesthesia Quick Evaluation

## 2017-05-26 NOTE — Transfer of Care (Signed)
Immediate Anesthesia Transfer of Care Note  Patient: Shirley Rogers  Procedure(s) Performed: LEFT KNEE ARTHROSCOPY WITH SUBCHONDROPLASTY, CHONDROPLASTY, SYNOVECTOMY, POSSIBLE MICROFX (Left )  Patient Location: PACU  Anesthesia Type:General  Level of Consciousness: awake, alert , oriented and patient cooperative  Airway & Oxygen Therapy: Patient Spontanous Breathing and Patient connected to face mask oxygen  Post-op Assessment: Report given to RN, Post -op Vital signs reviewed and stable and Patient moving all extremities X 4  Post vital signs: Reviewed and stable  Last Vitals:  Vitals:   05/26/17 1231 05/26/17 1606  BP: 137/84   Pulse: 73   Resp: 20   Temp: 36.7 C (P) 37 C  SpO2: 100%     Last Pain:  Vitals:   05/26/17 1606  TempSrc:   PainSc: (P) 0-No pain         Complications: No apparent anesthesia complications

## 2017-05-26 NOTE — Op Note (Signed)
Surgery Date: 05/26/2017  Surgeon(s): Leandrew Koyanagi, MD  ASSIST: Madalyn Rob, [place]; necessary for the timely completion of procedure and due to complexity of procedure.  ANESTHESIA:  general  FLUIDS: Per anesthesia record.   ESTIMATED BLOOD LOSS: minimal  PREOPERATIVE DIAGNOSES:  1. 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 Left knee synovitis 3. Left knee lateral femoral condyle focal chondromalacia grade 3 4. Left medial femoral condyle stress fracture  POSTOPERATIVE DIAGNOSES:  same  PROCEDURES PERFORMED:  1. Left knee arthroscopy with major synovectomy 2. Left medial femoral condyle subchondroplasty 3. Left knee arthroscopy with arthroscopic chondroplasty medial femoral condyle and lateral femoral condyle  DESCRIPTION OF PROCEDURE: Ms. Pfahler is a 51 y.o.-year-old female with left knee pain. Plans are to proceed with above mentioned procedures and diagnostic arthroscopy with debridement as indicated. Full discussion held regarding risks benefits alternatives and complications related surgical intervention. Conservative care options reviewed. All questions answered.  The patient was identified in the preoperative holding area and the operative extremity was marked. The patient was brought to the operating room and transferred to operating table in a supine position. Satisfactory general anesthesia was induced by anesthesiology.    Standard anterolateral, anteromedial arthroscopy portals were obtained. The anteromedial portal was obtained with a spinal needle for localization under direct visualization with subsequent diagnostic findings.   We first performed a diagnostic arthroscopy of the knee with major synovectomy of all 3 compartments using an oscillating shaver.  After this was done we then addressed the medial compartment which showed widespread grade IV chondromalacia of the medial femoral condyle and medial tibial plateau.  The meniscus  itself was intact without any evidence of instability or tears.  Gentle chondroplasty was performed of the medial tibial plateau and medial femoral condyle.  We then repositioned the arthroscope into the lateral compartment which showed a focal 1 cm grade 3 lesion of the lateral femoral condyle.  This was probed for stability and then was debrided back to a stable border with a full-radius shaver.  The lateral meniscus was essentially unremarkable.  We then evaluated the patellofemoral compartment which showed grade II-III chondromalacia of the patella and femoral trochlea.  We then removed the arthroscope and then brought in the large fluoroscope.  Using fluoroscopic guidance and preoperative MRI scan I was able to target the medial femoral condyle lesion.  A stab incision was then created and the cannulated drill was advanced from the medial side of the knee into the medial femoral condyle to the appropriate depth.  The trocar was then removed and 5 cc of calcium phosphate cement was then injected into the medial femoral condyle stress fracture.  Sequential x-rays were taken to show filling of the defect.  The cement was then allowed to harden.  After this was done the arthroscope was then inserted back into the knee to make sure there was no intra-articular extravasation of the cement.  Suprapatellar pouch and gutters: moderate synovitis or debris. Patella chondral surface: Grade 3 Trochlear chondral surface: Grade 2 Patellofemoral tracking: normal Medial meniscus: unremarkable.  Medial femoral condyle flexion bearing surface: Grade 4 Medial femoral condyle extension bearing surface: Grade 4 Medial tibial plateau: Grade 3 Anterior cruciate ligament:stable Posterior cruciate ligament:stable Lateral meniscus: unremarkable.   Lateral femoral condyle flexion bearing surface: Grade 0 Lateral femoral condyle extension bearing surface: Grade 3  Lateral tibial plateau: Grade 0  DISPOSITION: The patient  was awakened from general anesthetic, extubated, taken to the recovery room in medically  stable condition, no apparent complications. The patient may be weightbearing as tolerated to the operative lower extremity.  Range of motion of right knee as tolerated.  Azucena Cecil, MD [place] 3:56 PM

## 2017-05-27 NOTE — Anesthesia Postprocedure Evaluation (Signed)
Anesthesia Post Note  Patient: Shirley Rogers  Procedure(s) Performed: LEFT KNEE ARTHROSCOPY WITH SUBCHONDROPLASTY, CHONDROPLASTY, SYNOVECTOMY, POSSIBLE MICROFX (Left )     Patient location during evaluation: PACU Anesthesia Type: General Level of consciousness: awake and alert Pain management: pain level controlled Vital Signs Assessment: post-procedure vital signs reviewed and stable Respiratory status: spontaneous breathing, nonlabored ventilation, respiratory function stable and patient connected to nasal cannula oxygen Cardiovascular status: blood pressure returned to baseline and stable Postop Assessment: no apparent nausea or vomiting Anesthetic complications: no    Last Vitals:  Vitals:   05/26/17 1700 05/26/17 1728  BP:  121/78  Pulse: 81 89  Resp: 13 18  Temp: 36.7 C 36.7 C  SpO2: 93% 93%    Last Pain:  Vitals:   05/26/17 1700  TempSrc:   PainSc: 4                  Lonzie Simmer

## 2017-05-29 ENCOUNTER — Encounter (HOSPITAL_COMMUNITY): Payer: Self-pay | Admitting: Orthopaedic Surgery

## 2017-06-01 ENCOUNTER — Ambulatory Visit: Payer: Self-pay | Admitting: *Deleted

## 2017-06-01 NOTE — Telephone Encounter (Signed)
FYI

## 2017-06-01 NOTE — Telephone Encounter (Signed)
Called in c/o Hurting in her left lower back, fever up to 101.7, feeling dizzy, no appetite and stomach hurting.   (She had knee surgery last Friday).   She is having difficulty urinating and feels as though her bladder is full even after going to the bathroom.   One week before surgery she had blood in her urine that she did not tell the surgeon about.  She had a kidney stone.   She ended up passing the kidney stone right before surgery.  She had blood in her urine for 4 days after surgery that she did not mention to her doctor.  I referred her to the urgent care or ED.   She can't drive right now due to recent knee surgery but she is going to call her roommate to come [facility].   I instructed her it was important that she be seen today due to recent surgery and her s/s with urination and history of passing a kidney stone recently.    I instructed her to call us back if the [place] situation did not work out or go to the ED.  She verbalized understanding.  Reason for Disposition . Fever > 100.5 F (38.1 C)  Answer Assessment - Initial Assessment Questions 1. SEVERITY: "How bad is the pain?"  (e.g., Scale 1-10; mild, moderate, or severe)   - MILD (1-3): complains slightly about urination hurting   - MODERATE (4-7): interferes with normal activities     - SEVERE (8-10): excruciating, unwilling or unable to urinate because of the pain      I'm having pain in my back on left lower side all the time.   I feel like I'm not fully empying my bladder. 2. FREQUENCY: "How many times have you had painful urination today?"      It burns and hurts every time I urinate.   I've peed 4 times..  My bladder still feel ful. 3. PATTERN: "Is pain present every time you urinate or just sometimes?"      Every time 4. ONSET: "When did the painful urination start?"      A week ago Sunday. 5. FEVER: "Do you have a fever?" If so, ask: "What is your temperature, how was it measured, and when did  it start?"     10 0 now     It has been as high as 101.7 6. PAST UTI: "Have you had a urine infection before?" If so, ask: "When was the last time?" and "What happened that time?"      Yes 7. CAUSE: "What do you think is causing the painful urination?"  (e.g., UTI, scratch, Herpes sore)     I think I might have a bladder infection or a kidney infection. 8. OTHER SYMPTOMS: "Do you have any other symptoms?" (e.g., flank pain, vaginal discharge, genital sores, urgency, blood in urine)     No blood in urine at this point but I did prior to my knee surgery.   I passed a kidney stone. 9. PREGNANCY: "Is there any chance you are pregnant?" "When was your last menstrual period?"     No  menopausal  Protocols used: URINATION PAIN - FEMALE-A-AH

## 2017-06-02 ENCOUNTER — Other Ambulatory Visit: Payer: BC Managed Care – PPO

## 2017-06-02 ENCOUNTER — Other Ambulatory Visit: Payer: Self-pay | Admitting: Family Medicine

## 2017-06-02 DIAGNOSIS — Z79899 Other long term (current) drug therapy: Secondary | ICD-10-CM

## 2017-06-02 DIAGNOSIS — R300 Dysuria: Secondary | ICD-10-CM

## 2017-06-03 LAB — CBC WITH DIFFERENTIAL/PLATELET
BASOS: 0 %
Basophils Absolute: 0 10*3/uL (ref 0.0–0.2)
EOS (ABSOLUTE): 0.1 10*3/uL (ref 0.0–0.4)
EOS: 2 %
HEMATOCRIT: 34.8 % (ref 34.0–46.6)
Hemoglobin: 11.9 g/dL (ref 11.1–15.9)
IMMATURE GRANS (ABS): 0 10*3/uL (ref 0.0–0.1)
Immature Granulocytes: 1 %
LYMPHS: 15 %
Lymphocytes Absolute: 1 10*3/uL (ref 0.7–3.1)
MCH: 30.9 pg (ref 26.6–33.0)
MCHC: 34.2 g/dL (ref 31.5–35.7)
MCV: 90 fL (ref 79–97)
MONOS ABS: 0.6 10*3/uL (ref 0.1–0.9)
Monocytes: 9 %
NEUTROS PCT: 73 %
Neutrophils Absolute: 4.6 10*3/uL (ref 1.4–7.0)
PLATELETS: 253 10*3/uL (ref 150–379)
RBC: 3.85 x10E6/uL (ref 3.77–5.28)
RDW: 13.5 % (ref 12.3–15.4)
WBC: 6.3 10*3/uL (ref 3.4–10.8)

## 2017-06-06 ENCOUNTER — Ambulatory Visit (INDEPENDENT_AMBULATORY_CARE_PROVIDER_SITE_OTHER): Payer: BC Managed Care – PPO | Admitting: Orthopaedic Surgery

## 2017-06-06 ENCOUNTER — Encounter (INDEPENDENT_AMBULATORY_CARE_PROVIDER_SITE_OTHER): Payer: Self-pay | Admitting: Orthopaedic Surgery

## 2017-06-06 DIAGNOSIS — Z9889 Other specified postprocedural states: Secondary | ICD-10-CM | POA: Insufficient documentation

## 2017-06-06 NOTE — Progress Notes (Signed)
Office Visit Note   Patient: Shirley Rogers           Date of Birth: 05-19-1966           MRN: [id number] Visit Date: 06/06/2017              Requested by: Shirley Roys, DO [place], [place] [address] PCP: Shirley Roys, DO   Assessment & Plan: Visit Diagnoses:  1. S/P left knee arthroscopy     Plan: Impression: Status post left knee arthroscopic debridement, chondroplasty, synovectomy, sub-chondroplasty date of surgery 05/26/2017.  At this point I believe Shirley Rogers is motivated enough to rehab this on her own.  Range of motion exercises were given.  Nylon sutures were removed.  She is to advance with activity as tolerated.  She will follow-up with [place] in 5 weeks time for repeat evaluation.  Follow-Up Instructions: Return in about 5 weeks (around 07/11/2017).   Orders:  No orders of the defined types were placed in this encounter.  No orders of the defined types were placed in this encounter.     Procedures: No procedures performed   Clinical Data: No additional findings.   Subjective: Chief Complaint  Patient presents with  . Left Knee - Pain    HPI Shirley Rogers comes in for follow-up.  11 days out left knee arthroscopic debridement and sub-chondroplasty date of surgery 05/26/2017.  She has been touchdown weightbearing.  No fevers chills or any other systemic symptoms.  Minimal pain.  She has also been working on range of motion exercises without issues.  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  Review of Systems as detailed in HPI, all others reviewed and are negative.   Objective: Vital Signs: LMP 07/01/2013 Comment: tubal  Physical Exam well-developed well-nourished female in no acute distress.  Alert and oriented x3.  Ortho Exam examination of her left knee reveals a trace effusion.  Well-healing surgical portals with nylon sutures in place.  No evidence of infection.  No calf tenderness.  She is neurovascular intact  distally.  Specialty Comments:  No specialty comments available.  Imaging: No results found.   PMFS History: Patient Active Problem List   Diagnosis Date Noted  . S/P left knee arthroscopy 06/06/2017  . Stress fracture of femur 05/16/2017  . Chondromalacia, left knee 05/16/2017  . OA (osteoarthritis) of knee 04/26/2017  . ASCUS with positive high risk HPV cervical 01/18/2017  . Increased intracranial pressure 10/06/2016  . Vestibular hypofunction of left ear 09/21/2016  . Intracranial hypertension, benign 09/21/2016  . Chronic daily headache 09/21/2016  . Inflammatory arthritis 08/28/2016  . Nephrolithiasis 07/22/2016  . Dysuria 07/22/2016  . Right sided weakness 06/10/2016  . Mitochondrial ataxia syndrome (Shirley Rogers) 04/24/2016  . 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 Microhematuria 03/15/2016  . Pelvic floor dysfunction 03/15/2016  . Urethral caruncle 03/15/2016  . History of pulmonary embolus (PE) 03/11/2016  . Facial droop 02/04/2016  . Right hemiparesis (Shirley Rogers) 02/04/2016  . Tobacco use 12/03/2015  . ANA positive 10/19/2015  . Transient alteration of awareness 09/24/2015  . Snoring 06/10/2015  . Sleep apnea 06/10/2015  . 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 Globus sensation 03/30/2015  . Post concussion syndrome 03/13/2015  . Depression 03/13/2015  . Benign hypertensive renal disease   . Barrett esophagus   . Diverticulosis   . Hemiplegic migraine   . Lupus   .  History of stroke   . Fall 05/25/2014   Past Medical History:  Diagnosis Date  . Anxiety    on xanax in the past  . ASCVD (arteriosclerotic cardiovascular disease)    carotids  . Barrett esophagus   . 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 Carotid atherosclerosis 2003   on ultrasound  . Colon polyp   . Complicated migraine    with facial drooping  . Concussion    1974,1982,1987,2016,2016  . DDD (degenerative disc disease), lumbar    MRI 2008, L3-4, L5-S1,  spondylotic changes, multilevel facet joint hypertrophic changes  . Depression    on paxil, lexapro, cymbalta in the past  . Diverticulosis   . Dizziness due to old head injury   . Family history of adverse reaction to anesthesia    father - slow to wake  . Fatty liver 2013   On CT abdomen/pelvis  . Fibrocystic breast disease   . Fibromyalgia   . Fracture    left knee medial femoral condyle stress fracture, chondromalacia  . GERD (gastroesophageal reflux disease)   . Hemiplegic migraine    daily  . Hemorrhoids   . History of kidney stones   . Hyperlipidemia   . Hypertension    no meds currently.  . IBS (irritable bowel syndrome)   . Insomnia chronic  . Kidney stone 2013   on CT abdomen/pelvis  . 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 Migraine   . Narrowing of intervertebral disc space 2011   C5-6  . Normal cardiac stress test 2009   EF 66%  . Pneumonia   . Pulmonary emboli (Shirley Rogers)   . Recurrent sinusitis   . Recurrent UTI    question about IC in the past  . Sleep apnea    CPAP machine broken  . Stroke (Shirley Rogers)    2000 - no deficits  . Umbilical hernia 8502   on CT abdomen/pelvis  . Varicose veins    right lower leg  . Vascular spasm (Shirley Rogers)   . Vertigo    from concussive disorder.  seeing neuro 03/23/15    Family History  Problem Relation Age of Onset  . Other Mother        IBS  . Hypertension Mother   . Heart disease Father   . Diverticulosis Father   . Migraines Father   . Other Son        Ulcerative Colitis, Polonidal cyst  . Parkinson's disease Maternal Grandmother   . Cancer Maternal Grandmother   . Mental illness Paternal Grandmother   . Stroke Paternal Grandmother   . Cancer Paternal Grandmother        brain cancer  . Bipolar disorder Paternal Grandmother   . Stroke Maternal Grandfather   . Cancer Maternal Grandfather        Bladder, Prostate/bladder/prostate  . Heart disease Paternal  Grandfather   . Heart attack Paternal Grandfather   . Macular degeneration Unknown   . Diabetes Brother   . Breast cancer Maternal Aunt     Past Surgical History:  Procedure Laterality Date  . Biopsy Punch Thyroid    . BREAST EXCISIONAL BIOPSY Right 2000   right mass excision - atypical hyperplasia  . BREAST SURGERY Right    atypical hyperplasia  . CHOLECYSTECTOMY N/A 07/24/2015   Procedure: LAPAROSCOPIC CHOLECYSTECTOMY WITH INTRAOPERATIVE CHOLANGIOGRAM;  Surgeon: Leonie Green, MD;  Location: [facility] ORS;  Service: General;  Laterality:  N/A;  . COLONOSCOPY W/ BIOPSIES     Removed 5 polyps  . COLONOSCOPY WITH PROPOFOL N/A 03/03/2017   Procedure: COLONOSCOPY WITH PROPOFOL;  Surgeon: [name], MD;  Location: [facility] ENDOSCOPY;  Service: Gastroenterology;  Laterality: N/A;  . KNEE ARTHROSCOPY WITH SUBCHONDROPLASTY Left 05/26/2017   Procedure: LEFT KNEE ARTHROSCOPY WITH SUBCHONDROPLASTY, CHONDROPLASTY, SYNOVECTOMY, POSSIBLE MICROFX;  Surgeon: Leandrew Koyanagi, MD;  Location: [place];  Service: Orthopedics;  Laterality: Left;  . 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 Smoking status: Current Some Day Smoker    Types: Cigarettes    Last attempt to quit: 06/12/2015    Years since quitting: 1.9  . Smokeless tobacco: Never Used  . Tobacco comment: 1-2 cigarettes some days  Substance and Sexual Activity  . Alcohol use: No    Alcohol/week: 0.0 oz  . Drug use: No  . Sexual activity: No    Comment: tubal ligation and menopause

## 2017-06-07 ENCOUNTER — Ambulatory Visit: Payer: Self-pay | Admitting: Unknown Physician Specialty

## 2017-06-20 ENCOUNTER — Other Ambulatory Visit: Payer: Self-pay | Admitting: Family Medicine

## 2017-06-23 ENCOUNTER — Other Ambulatory Visit: Payer: Self-pay | Admitting: Family Medicine

## 2017-06-23 MED ORDER — TRAMADOL HCL 50 MG PO TABS: 50.0000 mg | ORAL_TABLET | Freq: Two times a day (BID) | ORAL | 0 refills | Status: DC | PRN

## 2017-06-23 MED ORDER — DULOXETINE HCL 60 MG PO CPEP: 60.0000 mg | ORAL_CAPSULE | Freq: Every day | ORAL | 1 refills | Status: DC

## 2017-06-25 ENCOUNTER — Other Ambulatory Visit: Payer: Self-pay | Admitting: Family Medicine

## 2017-06-27 ENCOUNTER — Telehealth: Payer: Self-pay | Admitting: Family Medicine

## 2017-06-27 MED ORDER — TRAMADOL HCL 50 MG PO TABS: 50.0000 mg | ORAL_TABLET | Freq: Two times a day (BID) | ORAL | 0 refills | Status: DC | PRN

## 2017-06-27 NOTE — Telephone Encounter (Signed)
Room mate came in- was not able to pick up Rx as they said that she couldn't pick up Rx as it can't be faxed in. No call from the pharmacy. Printed and given to room mate.   Can we call [facility] in [place] and check on this? We have always been able to fax wet signed schedule IVs.

## 2017-06-27 NOTE — Telephone Encounter (Signed)
Spoke with pharmacy, they stated that they did not receive the new prescription via fax.

## 2017-07-11 ENCOUNTER — Encounter (INDEPENDENT_AMBULATORY_CARE_PROVIDER_SITE_OTHER): Payer: Self-pay | Admitting: Orthopaedic Surgery

## 2017-07-11 ENCOUNTER — Ambulatory Visit (INDEPENDENT_AMBULATORY_CARE_PROVIDER_SITE_OTHER): Payer: BC Managed Care – PPO | Admitting: Orthopaedic Surgery

## 2017-07-11 ENCOUNTER — Other Ambulatory Visit: Payer: Self-pay | Admitting: Family Medicine

## 2017-07-11 DIAGNOSIS — M1711 Unilateral primary osteoarthritis, right knee: Secondary | ICD-10-CM | POA: Diagnosis not present

## 2017-07-11 DIAGNOSIS — Z9889 Other specified postprocedural states: Secondary | ICD-10-CM

## 2017-07-11 MED ORDER — METHYLPREDNISOLONE ACETATE 40 MG/ML IJ SUSP
40.0000 mg | INTRAMUSCULAR | Status: AC | PRN
  Administered 2017-07-11: 40 mg via INTRA_ARTICULAR

## 2017-07-11 MED ORDER — BUPIVACAINE HCL 0.5 % IJ SOLN
2.0000 mL | INTRAMUSCULAR | Status: AC | PRN
  Administered 2017-07-11: 2 mL via INTRA_ARTICULAR

## 2017-07-11 MED ORDER — LIDOCAINE HCL 1 % IJ SOLN
2.0000 mL | INTRAMUSCULAR | Status: AC | PRN
  Administered 2017-07-11: 2 mL

## 2017-07-11 NOTE — Progress Notes (Signed)
Office Visit Note   Patient: Shirley Rogers           Date of Birth: 06-25-1965           MRN: [id number] Visit Date: 07/11/2017              Requested by: Valerie Roys, DO [place], [place] [address] PCP: Valerie Roys, DO   Assessment & Plan: Visit Diagnoses:  1. S/P left knee arthroscopy   2. Unilateral primary osteoarthritis, right knee     Plan: Patient is 6 weeks status post left knee arthroscopy with sub-chondroplasty and doing very well and right knee osteoarthritis.  From the left knee standpoint I am happy with her improvement.  She can slowly increase activity as tolerated.  For the right knee we did review the x-rays which does show a periarticular spurring consistent with osteoarthritis.  Cortisone injection was performed today.  Follow-up as needed.  If no significant improvement from cortisone injection will need to consider MRI of the right knee.  Follow-Up Instructions: Return if symptoms worsen or fail to improve.   Orders:  No orders of the defined types were placed in this encounter.  No orders of the defined types were placed in this encounter.     Procedures: Large Joint Inj: R knee on 07/11/2017 5:38 PM Indications: pain Details: 22 G needle  Arthrogram: No  Medications: 40 mg methylPREDNISolone acetate 40 MG/ML; 2 mL lidocaine 1 %; 2 mL bupivacaine 0.5 % Consent was given by the patient. Patient was prepped and draped in the usual sterile fashion.       Clinical Data: No additional findings.   Subjective: Chief Complaint  Patient presents with  . Left Knee - Pain    Patient comes in today 6 weeks status post left knee arthroscopy and also for follow-up of unrelated right knee pain.  She has noticed that she has had increasing pain in her right knee.  Her left knee is doing very well and has really been happy with her progress and improvement from surgery.    Review of Systems  Constitutional: Negative.   HENT: Negative.     Eyes: Negative.   Respiratory: Negative.   Cardiovascular: Negative.   Endocrine: Negative.   Musculoskeletal: Negative.   Neurological: Negative.   Hematological: Negative.   Psychiatric/Behavioral: Negative.   All other systems reviewed and are negative.    Objective: Vital Signs: LMP 07/01/2013 Comment: tubal  Physical Exam  Constitutional: She is oriented to person, place, and time. She appears well-developed and well-nourished.  Pulmonary/Chest: Effort normal.  Neurological: She is alert and oriented to person, place, and time.  Skin: Skin is warm. Capillary refill takes less than 2 seconds.  Psychiatric: She has a normal mood and affect. Her behavior is normal. Judgment and thought content normal.  Nursing note and vitals reviewed.   Ortho Exam Left knee exam shows fully healed surgical scars.  Excellent range of motion and good quad strength  Right knee exam shows no joint effusion.  Medial joint line tenderness.  Collaterals and cruciates are stable. Specialty Comments:  No specialty comments available.  Imaging: No results found.   PMFS History: Patient Active Problem List   Diagnosis Date Noted  . Unilateral primary osteoarthritis, right knee 07/11/2017  . S/P left knee arthroscopy 06/06/2017  . Stress fracture of femur 05/16/2017  . Chondromalacia, left knee 05/16/2017  . OA (osteoarthritis) of knee 04/26/2017  . ASCUS with positive  high risk HPV cervical 01/18/2017  . Increased intracranial pressure 10/06/2016  . Vestibular hypofunction of left ear 09/21/2016  . Intracranial hypertension, benign 09/21/2016  . Chronic daily headache 09/21/2016  . Inflammatory arthritis 08/28/2016  . Nephrolithiasis 07/22/2016  . Dysuria 07/22/2016  . Right sided weakness 06/10/2016  . 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 Behcet's disease ([place]) 04/24/2016  . Microhematuria 03/15/2016  . Pelvic floor dysfunction 03/15/2016  . Urethral caruncle 03/15/2016   . History of pulmonary embolus (PE) 03/11/2016  . Facial droop 02/04/2016  . Right hemiparesis ([place]) 02/04/2016  . Tobacco use 12/03/2015  . ANA positive 10/19/2015  . Transient alteration of awareness 09/24/2015  . Snoring 06/10/2015  . Sleep apnea 06/10/2015  . 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 Globus sensation 03/30/2015  . Post concussion syndrome 03/13/2015  . Depression 03/13/2015  . Benign hypertensive renal disease   . Barrett esophagus   . Diverticulosis   . Hemiplegic migraine   . Lupus   . History of stroke   . Fall 05/25/2014   Past Medical History:  Diagnosis Date  . Anxiety    on xanax in the past  . ASCVD (arteriosclerotic cardiovascular disease)    carotids  . Barrett esophagus   . Behcet's syndrome ([place])   . Carotid atherosclerosis 2003   on ultrasound  . Colon polyp   . Complicated migraine    with facial drooping  . Concussion    1974,1982,1987,2016,2016  . DDD (degenerative disc disease), lumbar    MRI 2008, L3-4, L5-S1, spondylotic changes, multilevel facet joint hypertrophic changes  . Depression    on paxil, lexapro, cymbalta in the past  . Diverticulosis   . Dizziness due to old head injury   . Family history of adverse reaction to anesthesia    father - slow to wake  . Fatty liver 2013   On CT abdomen/pelvis  . Fibrocystic breast disease   . Fibromyalgia   . Fracture    left knee medial femoral condyle stress fracture, chondromalacia  . GERD (gastroesophageal reflux disease)   . Hemiplegic migraine    daily  . Hemorrhoids   . History of kidney stones   . Hyperlipidemia   . Hypertension    no meds currently.  . IBS (irritable bowel syndrome)   . Insomnia chronic  . Kidney stone 2013   on CT abdomen/pelvis  . 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 Migraine   . Narrowing of intervertebral disc space 2011   C5-6   . Normal cardiac stress test 2009   EF 66%  . Pneumonia   . Pulmonary emboli ([place])   . Recurrent sinusitis   . Recurrent UTI    question about IC in the past  . Sleep apnea    CPAP machine broken  . Stroke ([place])    2000 - no deficits  . Umbilical hernia 4193   on CT abdomen/pelvis  . Varicose veins    right lower leg  . Vascular spasm ([place])   . Vertigo    from concussive disorder.  seeing neuro 03/23/15    Family History  Problem Relation Age of Onset  . Other Mother        IBS  . Hypertension Mother   . Heart disease Father   . Diverticulosis Father   . Migraines Father   . Other  Son        Ulcerative Colitis, Polonidal cyst  . Parkinson's disease Maternal Grandmother   . Cancer Maternal Grandmother   . Mental illness Paternal Grandmother   . Stroke Paternal Grandmother   . Cancer Paternal Grandmother        brain cancer  . Bipolar disorder Paternal Grandmother   . Stroke Maternal Grandfather   . Cancer Maternal Grandfather        Bladder, Prostate/bladder/prostate  . Heart disease Paternal Grandfather   . Heart attack Paternal Grandfather   . Macular degeneration Unknown   . Diabetes Brother   . Breast cancer Maternal Aunt     Past Surgical History:  Procedure Laterality Date  . Biopsy Punch Thyroid    . BREAST EXCISIONAL BIOPSY Right 2000   right mass excision - atypical hyperplasia  . BREAST SURGERY Right    atypical hyperplasia  . CHOLECYSTECTOMY N/A 07/24/2015   Procedure: LAPAROSCOPIC CHOLECYSTECTOMY WITH INTRAOPERATIVE CHOLANGIOGRAM;  Surgeon: Leonie Green, MD;  Location: [facility] ORS;  Service: General;  Laterality: N/A;  . COLONOSCOPY W/ BIOPSIES     Removed 5 polyps  . COLONOSCOPY WITH PROPOFOL N/A 03/03/2017   Procedure: COLONOSCOPY WITH PROPOFOL;  Surgeon: [name], MD;  Location: [facility] ENDOSCOPY;  Service: Gastroenterology;  Laterality: N/A;  . KNEE ARTHROSCOPY WITH SUBCHONDROPLASTY Left 05/26/2017   Procedure: LEFT KNEE ARTHROSCOPY WITH  SUBCHONDROPLASTY, CHONDROPLASTY, SYNOVECTOMY, POSSIBLE MICROFX;  Surgeon: Leandrew Koyanagi, MD;  Location: [place];  Service: Orthopedics;  Laterality: Left;  . 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 Smoking status: Current Some Day Smoker    Types: Cigarettes    Last attempt to quit: 06/12/2015    Years since quitting: 2.0  . Smokeless tobacco: Never Used  . Tobacco comment: 1-2 cigarettes some days  Substance and Sexual Activity  . Alcohol use: No    Alcohol/week: 0.0 oz  . Drug use: No  . Sexual activity: No    Comment: tubal ligation and menopause

## 2017-08-09 ENCOUNTER — Ambulatory Visit: Payer: Self-pay | Admitting: *Deleted

## 2017-08-09 ENCOUNTER — Other Ambulatory Visit: Payer: Self-pay | Admitting: Family Medicine

## 2017-08-09 NOTE — Telephone Encounter (Signed)
Routing to provider  

## 2017-08-09 NOTE — Telephone Encounter (Signed)
Copied from [place] [id number]. Topic: Quick Communication - Rx Refill/Question >> Aug 09, 2017 11:45 AM Oliver Pila B wrote: Medication: traMADol (ULTRAM) 50 MG tablet [[id number]]    Preferred Pharmacy (with phone number or street name): [facility] [address]   Agent: Please be advised that RX refills may take up to 3 business days. We ask that you follow-up with your pharmacy.  Pt also states that she has pink eye and would like to have something called in for that

## 2017-08-09 NOTE — Telephone Encounter (Signed)
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 She is having symptoms in both eyes- she needs to know if she can get treatment or if she need to find a substitute for her class. Reason for Disposition . MODERATE eye pain (e.g., interferes with normal activities)  Answer Assessment - Initial Assessment Questions 1. 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 REDNESS OF SCLERA: "Is the redness in one or both eyes?" "When did the redness start?"      Right eye is red and irritaed 3. EYELIDS: "Are the eyelids red or swollen?" If so, ask: "How much?"      no 4. VISION: "Is there any difficulty seeing clearly?"      irritated 5. PAIN: "Is there any pain? If so, ask: "How bad is it?" (Scale 1-10; or mild, moderate, severe)    - MILD (1-3): doesn't interfere with normal activities     - MODERATE (4-7): interferes with normal activities or awakens from sleep    - SEVERE (8-10): excruciating pain, unable to do any normal activities       moderate 6. CONTACT LENS: "Do you wear contacts?"     no 7. OTHER SYMPTOMS: "Do you have any other symptoms?" (e.g., fever, runny nose, cough)     no 8. PREGNANCY: "Is there any chance you are pregnant?" "When was your last menstrual period?"     n/a  Protocols used: EYE - PUS OR DISCHARGE-A-AH

## 2017-08-09 NOTE — Telephone Encounter (Signed)
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 Eye is draining today and left eye has also started draining. Patient feels irritation in her right eye. Light is really bothering her. Patient is contagious and needs to know if she needs to get substitute.- see triage call  Patient is requesting a refill on Ultram 50 mg  LOV: 05/18/17    PCP: [place]: verified

## 2017-08-10 ENCOUNTER — Encounter: Payer: Self-pay | Admitting: Family Medicine

## 2017-08-10 ENCOUNTER — Other Ambulatory Visit: Payer: Self-pay | Admitting: Family Medicine

## 2017-08-10 MED ORDER — BACITRACIN-POLYMYXIN B 500-10000 UNIT/GM OP OINT: 1.0000 "application " | TOPICAL_OINTMENT | Freq: Two times a day (BID) | OPHTHALMIC | 0 refills | Status: DC

## 2017-08-10 MED ORDER — TRAMADOL HCL 50 MG PO TABS: 50.0000 mg | ORAL_TABLET | Freq: Two times a day (BID) | ORAL | 0 refills | Status: DC | PRN

## 2017-09-13 ENCOUNTER — Other Ambulatory Visit: Payer: Self-pay | Admitting: Family Medicine

## 2017-09-14 NOTE — Telephone Encounter (Signed)
LOV  05/18/17 Dr. Wynetta Emery

## 2017-10-01 ENCOUNTER — Encounter: Payer: Self-pay | Admitting: Family Medicine

## 2017-10-02 ENCOUNTER — Ambulatory Visit: Payer: BC Managed Care – PPO | Admitting: Family Medicine

## 2017-10-07 ENCOUNTER — Other Ambulatory Visit: Payer: Self-pay | Admitting: Family Medicine

## 2017-10-11 ENCOUNTER — Other Ambulatory Visit: Payer: Self-pay | Admitting: Family Medicine

## 2017-10-11 NOTE — Telephone Encounter (Signed)
Refill request for Trazodone, last filled on 04/17/17 #90 with 1 refill.   LOV: 05/18/17 Dr. Wynetta Emery

## 2017-10-20 ENCOUNTER — Other Ambulatory Visit: Payer: Self-pay | Admitting: Family Medicine

## 2017-10-20 MED ORDER — TRAMADOL HCL 50 MG PO TABS: 50.0000 mg | ORAL_TABLET | Freq: Two times a day (BID) | ORAL | 0 refills | Status: DC | PRN

## 2017-10-29 NOTE — Progress Notes (Deleted)
   LMP 07/01/2013 Comment: tubal   Subjective:    Patient ID: Shirley Rogers, female    DOB: Nov 07, 1965, 52 y.o.   MRN: [id number]  HPI: Shirley Rogers is a 52 y.o. female  No chief complaint on file.   Relevant past medical, surgical, family and social history reviewed and updated as indicated. Interim medical history since our last visit reviewed. Allergies and medications reviewed and updated.  Review of Systems  Per HPI unless specifically indicated above     Objective:    LMP 07/01/2013 Comment: tubal  Wt Readings from Last 3 Encounters:  05/26/17 255 lb (115.7 kg)  05/18/17 258 lb (117 kg)  04/26/17 259 lb (117.5 kg)    Physical Exam  Results for orders placed or performed in visit on 06/02/17  CBC with Differential/Platelet  Result Value Ref Range   WBC 6.3 3.4 - 10.8 x10E3/uL   RBC 3.85 3.77 - 5.28 x10E6/uL   Hemoglobin 11.9 11.1 - 15.9 g/dL   Hematocrit 34.8 34.0 - 46.6 %   MCV 90 79 - 97 fL   MCH 30.9 26.6 - 33.0 pg   MCHC 34.2 31.5 - 35.7 g/dL   RDW 13.5 12.3 - 15.4 %   Platelets 253 150 - 379 x10E3/uL   Neutrophils 73 Not Estab. %   Lymphs 15 Not Estab. %   Monocytes 9 Not Estab. %   Eos 2 Not Estab. %   Basos 0 Not Estab. %   Neutrophils Absolute 4.6 1.4 - 7.0 x10E3/uL   Lymphocytes Absolute 1.0 0.7 - 3.1 x10E3/uL   Monocytes Absolute 0.6 0.1 - 0.9 x10E3/uL   EOS (ABSOLUTE) 0.1 0.0 - 0.4 x10E3/uL   Basophils Absolute 0.0 0.0 - 0.2 x10E3/uL   Immature Granulocytes 1 Not Estab. %   Immature Grans (Abs) 0.0 0.0 - 0.1 x10E3/uL      Assessment & Plan:   Problem List Items Addressed This Visit    None       Follow up plan: No follow-ups on file.

## 2017-10-31 ENCOUNTER — Ambulatory Visit: Payer: BC Managed Care – PPO | Admitting: Family Medicine

## 2017-11-16 ENCOUNTER — Ambulatory Visit: Payer: BC Managed Care – PPO | Admitting: Family Medicine

## 2017-11-16 ENCOUNTER — Encounter: Payer: Self-pay | Admitting: Family Medicine

## 2017-11-16 VITALS — BP 144/90 | HR 72 | Wt 255.0 lb

## 2017-11-16 DIAGNOSIS — F331 Major depressive disorder, recurrent, moderate: Secondary | ICD-10-CM

## 2017-11-16 DIAGNOSIS — E8849 Other mitochondrial metabolism disorders: Secondary | ICD-10-CM

## 2017-11-16 DIAGNOSIS — G43409 Hemiplegic migraine, not intractable, without status migrainosus: Secondary | ICD-10-CM

## 2017-11-16 DIAGNOSIS — M329 Systemic lupus erythematosus, unspecified: Secondary | ICD-10-CM | POA: Diagnosis not present

## 2017-11-16 DIAGNOSIS — R0602 Shortness of breath: Secondary | ICD-10-CM

## 2017-11-16 DIAGNOSIS — R5383 Other fatigue: Secondary | ICD-10-CM | POA: Diagnosis not present

## 2017-11-16 DIAGNOSIS — M1712 Unilateral primary osteoarthritis, left knee: Secondary | ICD-10-CM

## 2017-11-16 DIAGNOSIS — K22719 Barrett's esophagus with dysplasia, unspecified: Secondary | ICD-10-CM | POA: Diagnosis not present

## 2017-11-16 DIAGNOSIS — N390 Urinary tract infection, site not specified: Secondary | ICD-10-CM

## 2017-11-16 DIAGNOSIS — R8281 Pyuria: Secondary | ICD-10-CM

## 2017-11-16 DIAGNOSIS — I129 Hypertensive chronic kidney disease with stage 1 through stage 4 chronic kidney disease, or unspecified chronic kidney disease: Secondary | ICD-10-CM

## 2017-11-16 DIAGNOSIS — G4733 Obstructive sleep apnea (adult) (pediatric): Secondary | ICD-10-CM

## 2017-11-16 DIAGNOSIS — IMO0002 Reserved for concepts with insufficient information to code with codable children: Secondary | ICD-10-CM

## 2017-11-16 DIAGNOSIS — G8191 Hemiplegia, unspecified affecting right dominant side: Secondary | ICD-10-CM

## 2017-11-16 NOTE — Progress Notes (Signed)
BP (!) 144/90   Pulse 72   Wt 255 lb (115.7 kg)   LMP 07/01/2013 Comment: tubal  SpO2 96%   BMI 42.43 kg/m    Subjective:    Patient ID: Shirley Rogers, female    DOB: Oct 14, 1965, 52 y.o.   MRN: [id number]  HPI: Shirley Rogers is a 52 y.o. female  Chief Complaint  Patient presents with  . Pain  . Migraine  . Depression  . Shortness of Breath    Patient stated that when she walk distances she gets SOB and "Noodle legs"   Has been feeling SOB. Feels like she can't catch her breath. Other days she is fine. Inhaler doesn't seem to be helping. Pain in R back where she had the pain with the PE in the past. Has been taking her medicine every day, hasn't missed any doses. Has been using her CPAP every night.   CHRONIC PAIN  Present dose: 55-100 Morphine equivalents Pain control status: stable Duration: chronic Location: L knee Quality: sharp Current Pain Level: 8/10 Previous Pain Level: moderate Breakthrough pain: yes Benefit from narcotic medications: yes What Activities task can be accomplished with current medication?- able to work and care for her home Interested in weaning off narcotics:no   Stool softners/OTC fiber: yes  Previous pain specialty evaluation: no Non-narcotic analgesic meds: yes Narcotic contract: yes  DEPRESSION Mood status: stable Satisfied with current treatment?: yes Symptom severity: moderate  Duration of current treatment : chronic Side effects: no Medication compliance: excellent compliance Psychotherapy/counseling: no  Previous psychiatric medications: cymbalta, welbutrin Depressed mood: yes Anxious mood: yes Anhedonia: no Significant weight loss or gain: no Insomnia: no  Fatigue: yes Feelings of worthlessness or guilt: yes Impaired concentration/indecisiveness: yes Suicidal ideations: no Hopelessness: no Crying spells: no Depression screen [facility] 2/9 11/16/2017 04/04/2017 12/13/2016 10/06/2016 05/31/2016  Decreased Interest 2 0 0 3 1  Down,  Depressed, Hopeless 1 0 0 2 0  PHQ - 2 Score 3 0 0 5 1  Altered sleeping 0 - 1 2 0  Tired, decreased energy 3 - 0 3 1  Change in appetite 2 - 0 2 1  Feeling bad or failure about yourself  0 - 0 1 0  Trouble concentrating 0 - 0 2 0  Moving slowly or fidgety/restless 1 - 0 1 0  Suicidal thoughts 0 - 0 0 0  PHQ-9 Score 9 - 1 16 3   Difficult doing work/chores Somewhat difficult - - - -    MIGRAINES- stable. Continuing to follow with neurology. Tolerating medicine well.   WEIGHT GAIN Duration: chronic Previous attempts at weight loss: yes, weight watchers, jenny craig Complications of obesity: HTN, HLD Peak weight: current Weight loss goal: to be healthy Weight loss to date: none Requesting obesity pharmacotherapy: yes Current weight loss supplements/medications: no Previous weight loss supplements/meds: no  Relevant past medical, surgical, family and social history reviewed and updated as indicated. Interim medical history since our last visit reviewed. Allergies and medications reviewed and updated.  Review of Systems  Constitutional: Negative.   HENT: Negative.   Respiratory: Positive for shortness of breath. Negative for apnea, cough, choking, chest tightness, wheezing and stridor.   Cardiovascular: Negative.   Musculoskeletal: Positive for arthralgias, back pain and myalgias. Negative for gait problem, joint swelling, neck pain and neck stiffness.  Skin: Negative.   Neurological: Negative.   Psychiatric/Behavioral: Negative.     Per HPI unless specifically indicated above     Objective:    BP Marland Kitchen)  144/90   Pulse 72   Wt 255 lb (115.7 kg)   LMP 07/01/2013 Comment: tubal  SpO2 96%   BMI 42.43 kg/m   Wt Readings from Last 3 Encounters:  11/16/17 255 lb (115.7 kg)  05/26/17 255 lb (115.7 kg)  05/18/17 258 lb (117 kg)    Physical Exam  Constitutional: She is oriented to person, place, and time. She appears well-developed and well-nourished. No distress.  HENT:    Head: Normocephalic and atraumatic.  Right Ear: Hearing normal.  Left Ear: Hearing normal.  Nose: Nose normal.  Mouth/Throat: Oropharynx is clear and moist. No oropharyngeal exudate or posterior oropharyngeal edema.  Eyes: Pupils are equal, round, and reactive to light. Conjunctivae, EOM and lids are normal. Right eye exhibits no discharge. Left eye exhibits no discharge. No scleral icterus.  Neck: Normal range of motion. Neck supple. No hepatojugular reflux and no JVD present. No tracheal deviation present. No thyromegaly present.  Cardiovascular: Normal rate, regular rhythm, normal heart sounds and intact distal pulses.  No extrasystoles are present. Exam reveals no gallop, no friction rub and no decreased pulses.  No murmur heard. Pulmonary/Chest: Effort normal and breath sounds normal. No accessory muscle usage or stridor. No apnea, no tachypnea and no bradypnea. No respiratory distress. She has no decreased breath sounds. She has no wheezes. She has no rhonchi. She has no rales.  Abdominal: Soft. Bowel sounds are normal. She exhibits no distension, no ascites and no mass. There is no splenomegaly or hepatomegaly. There is no tenderness. There is no rebound and no guarding.  Musculoskeletal: Normal range of motion.  Lymphadenopathy:    She has no cervical adenopathy.  Neurological: She is alert and oriented to person, place, and time. She is not disoriented. No cranial nerve deficit.  Skin: Skin is warm, dry and intact. Capillary refill takes less than 2 seconds. No ecchymosis and no rash noted. She is not diaphoretic. No cyanosis or erythema. No pallor. Nails show no clubbing.  Psychiatric: She has a normal mood and affect. Her speech is normal and behavior is normal. Judgment and thought content normal. Her mood appears not anxious. She is not agitated. Cognition and memory are normal.    Results for orders placed or performed in visit on 11/16/17  Microscopic Examination  Result Value  Ref Range   WBC, UA 0-5 0 - 5 /hpf   RBC, UA 3-10 (A) 0 - 2 /hpf   Epithelial Cells (non renal) 0-10 0 - 10 /hpf   Bacteria, UA None seen None seen/Few  CBC with Differential/Platelet  Result Value Ref Range   WBC 5.0 3.4 - 10.8 x10E3/uL   RBC 4.37 3.77 - 5.28 x10E6/uL   Hemoglobin 13.1 11.1 - 15.9 g/dL   Hematocrit 39.1 34.0 - 46.6 %   MCV 90 79 - 97 fL   MCH 30.0 26.6 - 33.0 pg   MCHC 33.5 31.5 - 35.7 g/dL   RDW 13.4 12.3 - 15.4 %   Platelets 232 150 - 450 x10E3/uL   Neutrophils 59 Not Estab. %   Lymphs 31 Not Estab. %   Monocytes 9 Not Estab. %   Eos 1 Not Estab. %   Basos 0 Not Estab. %   Neutrophils Absolute 2.9 1.4 - 7.0 x10E3/uL   Lymphocytes Absolute 1.5 0.7 - 3.1 x10E3/uL   Monocytes Absolute 0.4 0.1 - 0.9 x10E3/uL   EOS (ABSOLUTE) 0.1 0.0 - 0.4 x10E3/uL   Basophils Absolute 0.0 0.0 - 0.2 x10E3/uL   Immature  Granulocytes 0 Not Estab. %   Immature Grans (Abs) 0.0 0.0 - 0.1 x10E3/uL  Bayer DCA Hb A1c Waived  Result Value Ref Range   HB A1C (BAYER DCA - WAIVED) 5.2 <7.0 %  Comprehensive metabolic panel  Result Value Ref Range   Glucose 91 65 - 99 mg/dL   BUN 13 6 - 24 mg/dL   Creatinine, Ser 1.07 (H) 0.57 - 1.00 mg/dL   GFR calc non Af Amer 59 (L) >59 mL/min/1.73   GFR calc Af Amer 68 >59 mL/min/1.73   BUN/Creatinine Ratio 12 9 - 23   Sodium 144 134 - 144 mmol/L   Potassium 3.9 3.5 - 5.2 mmol/L   Chloride 102 96 - 106 mmol/L   CO2 28 20 - 29 mmol/L   Calcium 9.3 8.7 - 10.2 mg/dL   Total Protein 6.7 6.0 - 8.5 g/dL   Albumin 4.4 3.5 - 5.5 g/dL   Globulin, Total 2.3 1.5 - 4.5 g/dL   Albumin/Globulin Ratio 1.9 1.2 - 2.2   Bilirubin Total 0.3 0.0 - 1.2 mg/dL   Alkaline Phosphatase 61 39 - 117 IU/L   AST 14 0 - 40 IU/L   ALT 12 0 - 32 IU/L  Lipid Panel w/o Chol/HDL Ratio  Result Value Ref Range   Cholesterol, Total 222 (H) 100 - 199 mg/dL   Triglycerides 212 (H) 0 - 149 mg/dL   HDL 45 >39 mg/dL   VLDL Cholesterol Cal 42 (H) 5 - 40 mg/dL   LDL Calculated 135  (H) 0 - 99 mg/dL  TSH  Result Value Ref Range   TSH 0.518 0.450 - 4.500 uIU/mL  UA/M w/rflx Culture, Routine  Result Value Ref Range   Specific Gravity, UA 1.020 1.005 - 1.030   pH, UA 7.0 5.0 - 7.5   Color, UA Orange Yellow   Appearance Ur Cloudy (A) Clear   Leukocytes, UA Negative Negative   Protein, UA Trace (A) Negative/Trace   Glucose, UA Negative Negative   Ketones, UA Negative Negative   RBC, UA 2+ (A) Negative   Bilirubin, UA Negative Negative   Urobilinogen, Ur 1.0 0.2 - 1.0 mg/dL   Nitrite, UA Negative Negative   Microscopic Examination See below:   VITAMIN D 25 Hydroxy (Vit-D Deficiency, Fractures)  Result Value Ref Range   Vit D, 25-Hydroxy 26.5 (L) 30.0 - 100.0 ng/mL      Assessment & Plan:   Problem List Items Addressed This Visit      Cardiovascular and Mediastinum   Hemiplegic migraine - Primary    Doing very well on current regimen. Continue current regimen. Continue to follow with neurology. Call with any concerns.       Relevant Orders   CBC with Differential/Platelet (Completed)   Comprehensive metabolic panel (Completed)   TSH (Completed)   UA/M w/rflx Culture, Routine (Completed)   VITAMIN D 25 Hydroxy (Vit-D Deficiency, Fractures) (Completed)     Respiratory   Sleep apnea    Using CPAP without issues. If continues with SOB and fatigue- may need to repeat sleep study. Continue to monitor.         Digestive   Barrett esophagus    Stable. Continue current regimen. Continue to monitor. Call with any concerns. Refills given.         Nervous and Auditory   Right hemiparesis ([place])    Doing very well on current regimen. Continue current regimen. Continue to follow with neurology. Call with any concerns.  Musculoskeletal and Integument   OA (osteoarthritis) of knee    Doing well on her tramadol. Refill given today. Continue to monitor. Continue to follow with orthopedics. Call with any concerns.       Relevant Orders   CBC with  Differential/Platelet (Completed)   Comprehensive metabolic panel (Completed)   TSH (Completed)   UA/M w/rflx Culture, Routine (Completed)   VITAMIN D 25 Hydroxy (Vit-D Deficiency, Fractures) (Completed)     Genitourinary   Benign hypertensive renal disease    Under fair control. Will work on diet and exercise. Call with any concerns.         Other   Lupus ([place])    Stable. Continue current regimen. Continue to monitor. Call with any concerns. Continue to follow with rheumatology. Call with any concerns.       Relevant Orders   CBC with Differential/Platelet (Completed)   Comprehensive metabolic panel (Completed)   TSH (Completed)   UA/M w/rflx Culture, Routine (Completed)   VITAMIN D 25 Hydroxy (Vit-D Deficiency, Fractures) (Completed)   Depression    Under good control. Continue current regimen. Continue to monitor. Call with any concerns.       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 Has tried dietary modifications in the past. Will start it. Recheck 1 month. Call with any concerns.       Relevant Orders   CBC with Differential/Platelet (Completed)   Bayer DCA Hb A1c Waived (Completed)   Comprehensive metabolic panel (Completed)   Lipid Panel w/o Chol/HDL Ratio (Completed)   TSH (Completed)   UA/M w/rflx Culture, Routine (Completed)   VITAMIN D 25 Hydroxy (Vit-D Deficiency, Fractures) (Completed)    Other Visit Diagnoses    Fatigue, unspecified type       Checking labs. Await results. May need to see pulmonology. Call with any concerns.    Relevant Orders   CBC with Differential/Platelet (Completed)   Bayer DCA Hb A1c Waived (Completed)   Comprehensive metabolic panel (Completed)    TSH (Completed)   UA/M w/rflx Culture, Routine (Completed)   VITAMIN D 25 Hydroxy (Vit-D Deficiency, Fractures) (Completed)   SOB (shortness of breath)       Checking labs. Await results. May need to see pulmonology. Call with any concerns.    Relevant Orders   Ambulatory referral to Pulmonology   Hemiplegia affecting right dominant side, unspecified etiology, unspecified hemiplegia type ([place])   (Chronic)         Follow up plan: Return in about 1 month (around 12/14/2017) for Physical.

## 2017-11-17 ENCOUNTER — Ambulatory Visit (INDEPENDENT_AMBULATORY_CARE_PROVIDER_SITE_OTHER): Payer: BC Managed Care – PPO

## 2017-11-17 ENCOUNTER — Ambulatory Visit (INDEPENDENT_AMBULATORY_CARE_PROVIDER_SITE_OTHER): Payer: BC Managed Care – PPO | Admitting: Physician Assistant

## 2017-11-17 ENCOUNTER — Encounter (INDEPENDENT_AMBULATORY_CARE_PROVIDER_SITE_OTHER): Payer: Self-pay | Admitting: Physician Assistant

## 2017-11-17 DIAGNOSIS — M25561 Pain in right knee: Secondary | ICD-10-CM | POA: Diagnosis not present

## 2017-11-17 DIAGNOSIS — G8929 Other chronic pain: Secondary | ICD-10-CM

## 2017-11-17 DIAGNOSIS — M25562 Pain in left knee: Secondary | ICD-10-CM

## 2017-11-17 DIAGNOSIS — Z6841 Body Mass Index (BMI) 40.0 and over, adult: Secondary | ICD-10-CM

## 2017-11-17 DIAGNOSIS — M2242 Chondromalacia patellae, left knee: Secondary | ICD-10-CM

## 2017-11-17 LAB — UA/M W/RFLX CULTURE, ROUTINE
Bilirubin, UA: NEGATIVE
Glucose, UA: NEGATIVE
Ketones, UA: NEGATIVE
Leukocytes, UA: NEGATIVE
NITRITE UA: NEGATIVE
PH UA: 7 (ref 5.0–7.5)
Specific Gravity, UA: 1.02 (ref 1.005–1.030)
Urobilinogen, Ur: 1 mg/dL (ref 0.2–1.0)

## 2017-11-17 LAB — CBC WITH DIFFERENTIAL/PLATELET
Basophils Absolute: 0 10*3/uL (ref 0.0–0.2)
Basos: 0 %
EOS (ABSOLUTE): 0.1 10*3/uL (ref 0.0–0.4)
EOS: 1 %
HEMATOCRIT: 39.1 % (ref 34.0–46.6)
HEMOGLOBIN: 13.1 g/dL (ref 11.1–15.9)
IMMATURE GRANS (ABS): 0 10*3/uL (ref 0.0–0.1)
IMMATURE GRANULOCYTES: 0 %
LYMPHS ABS: 1.5 10*3/uL (ref 0.7–3.1)
LYMPHS: 31 %
MCH: 30 pg (ref 26.6–33.0)
MCHC: 33.5 g/dL (ref 31.5–35.7)
MCV: 90 fL (ref 79–97)
MONOCYTES: 9 %
Monocytes Absolute: 0.4 10*3/uL (ref 0.1–0.9)
NEUTROS PCT: 59 %
Neutrophils Absolute: 2.9 10*3/uL (ref 1.4–7.0)
Platelets: 232 10*3/uL (ref 150–450)
RBC: 4.37 x10E6/uL (ref 3.77–5.28)
RDW: 13.4 % (ref 12.3–15.4)
WBC: 5 10*3/uL (ref 3.4–10.8)

## 2017-11-17 LAB — COMPREHENSIVE METABOLIC PANEL
A/G RATIO: 1.9 (ref 1.2–2.2)
ALT: 12 IU/L (ref 0–32)
AST: 14 IU/L (ref 0–40)
Albumin: 4.4 g/dL (ref 3.5–5.5)
Alkaline Phosphatase: 61 IU/L (ref 39–117)
BUN/Creatinine Ratio: 12 (ref 9–23)
BUN: 13 mg/dL (ref 6–24)
Bilirubin Total: 0.3 mg/dL (ref 0.0–1.2)
CALCIUM: 9.3 mg/dL (ref 8.7–10.2)
CO2: 28 mmol/L (ref 20–29)
Chloride: 102 mmol/L (ref 96–106)
Creatinine, Ser: 1.07 mg/dL — ABNORMAL HIGH (ref 0.57–1.00)
GFR calc Af Amer: 68 mL/min/{1.73_m2} (ref >59)
GFR, EST NON AFRICAN AMERICAN: 59 mL/min/{1.73_m2} — AB (ref >59)
Globulin, Total: 2.3 g/dL (ref 1.5–4.5)
Glucose: 91 mg/dL (ref 65–99)
POTASSIUM: 3.9 mmol/L (ref 3.5–5.2)
Sodium: 144 mmol/L (ref 134–144)
Total Protein: 6.7 g/dL (ref 6.0–8.5)

## 2017-11-17 LAB — LIPID PANEL W/O CHOL/HDL RATIO
Cholesterol, Total: 222 mg/dL — ABNORMAL HIGH (ref 100–199)
HDL: 45 mg/dL (ref >39)
LDL Calculated: 135 mg/dL — ABNORMAL HIGH (ref 0–99)
TRIGLYCERIDES: 212 mg/dL — AB (ref 0–149)
VLDL Cholesterol Cal: 42 mg/dL — ABNORMAL HIGH (ref 5–40)

## 2017-11-17 LAB — MICROSCOPIC EXAMINATION: Bacteria, UA: NONE SEEN

## 2017-11-17 LAB — TSH: TSH: 0.518 u[IU]/mL (ref 0.450–4.500)

## 2017-11-17 LAB — BAYER DCA HB A1C WAIVED: HB A1C (BAYER DCA - WAIVED): 5.2 % (ref <7.0)

## 2017-11-17 LAB — VITAMIN D 25 HYDROXY (VIT D DEFICIENCY, FRACTURES): Vit D, 25-Hydroxy: 26.5 ng/mL — ABNORMAL LOW (ref 30.0–100.0)

## 2017-11-17 NOTE — Progress Notes (Signed)
Office Visit Note   Patient: Shirley Rogers           Date of Birth: 1965-10-20           MRN: [id number] Visit Date: 11/17/2017              Requested by: Valerie Roys, DO [place], [place] [address] PCP: Valerie Roys, DO   Assessment & Plan: Visit Diagnoses:  1. Chondromalacia patellae, left knee   2. Left knee pain, unspecified chronicity   3. Chronic pain of both knees   4. Morbid obesity ([place])   5. Body mass index 40.0-44.9, adult (HCC)     Plan: Impression is primary localized osteoarthritis bilateral knees.  Today, we will inject both with cortisone.  I have discussed the possibility of bilateral sequential total knee replacements in the future should the cortisone injections fail to continue to relieve her symptoms.  She does have a history of a PE from about 1 year ago and is anticoagulated with Eliquis.  She also has a BMI of 42 and I have discussed the importance of getting this under 40 before proceeding with total joint replacement.  She will follow-up with [place] as needed.  Follow-Up Instructions: Return if symptoms worsen or fail to improve.   Orders:  Orders Placed This Encounter  Procedures  . Large Joint Inj: bilateral knee  . XR KNEE 3 VIEW LEFT   No orders of the defined types were placed in this encounter.     Procedures: Large Joint Inj: bilateral knee on 11/17/2017 2:50 PM Indications: pain Details: 22 G needle, anterolateral approach Medications (Right): 0.66 mL bupivacaine 0.25 %; 3 mL lidocaine 1 %; 13.33 mg methylPREDNISolone acetate 40 MG/ML Medications (Left): 0.66 mL bupivacaine 0.25 %; 3 mL lidocaine 1 %; 13.33 mg methylPREDNISolone acetate 40 MG/ML      Clinical Data: No additional findings.   Subjective: Chief Complaint  Patient presents with  . Right Knee - Pain    S/p cortisone injection 07/11/17  . Left Knee - Pain    S/p arthroscopy with subchondroplasty, chondroplasty, synovectomy, possible microfracture - 05/26/18      HPI patient is a pleasant 52 year old female who presents to our clinic today with bilateral knee pain left greater than right.  History of left knee arthroscopic debridement, synovectomy and sub-chondroplasty 05/26/2018.  She was doing well in regards to the left knee until about a month and a half ago when her pain returned.  His dramatically worsened.  She is trying to lose weight and is unable to exercise due to this.  The pain she has is worse with weightbearing as well as going up and down stairs.  She does have night pain as well.  Occasional locking catching.  No instability.  She has been wearing her custom knee brace with some improvement of symptoms.  In regards to the right knee, she does have moderate medial compartment osteoarthritis there.  She had an intra-articular cortisone injection back in February which significantly helped until about a month ago.  Her pain is returned and she would like to repeat the injection.  She is having similar symptoms on the right as she was having on the left prior to surgical intervention there.  Review of Systems as detailed in HPI.  All others reviewed and are negative.   Objective: Vital Signs: LMP 07/01/2013 Comment: tubal  Physical Exam well-developed well-nourished female in no acute distress.  Alert and oriented x3.  Ortho Exam examination of both knees shows trace effusion.  Varus deformity besides.  Marked tenderness medial joint line.  Moderate patellofemoral crepitus.  She is stable valgus varus stress.  Neurovascular intact distally.  Specialty Comments:  No specialty comments available.  Imaging: Xr Knee 3 View Left  Result Date: 11/17/2017 X-rays show moderate joint space narrowing medial compartment both sides     PMFS History: Patient Active Problem List   Diagnosis Date Noted  . Chronic pain of both knees 11/17/2017  . Morbid obesity ([place]) 11/17/2017  . Body mass index 40.0-44.9, adult ([place]) 11/17/2017  . Unilateral  primary osteoarthritis, right knee 07/11/2017  . S/P left knee arthroscopy 06/06/2017  . Stress fracture of femur 05/16/2017  . Chondromalacia patellae, left knee 05/16/2017  . OA (osteoarthritis) of knee 04/26/2017  . ASCUS with positive high risk HPV cervical 01/18/2017  . Increased intracranial pressure 10/06/2016  . Vestibular hypofunction of left ear 09/21/2016  . Intracranial hypertension, benign 09/21/2016  . Chronic daily headache 09/21/2016  . Inflammatory arthritis 08/28/2016  . Nephrolithiasis 07/22/2016  . Dysuria 07/22/2016  . Right sided weakness 06/10/2016  . 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 Microhematuria 03/15/2016  . Pelvic floor dysfunction 03/15/2016  . Urethral caruncle 03/15/2016  . History of pulmonary embolus (PE) 03/11/2016  . Facial droop 02/04/2016  . Right hemiparesis ([place]) 02/04/2016  . Tobacco use 12/03/2015  . ANA positive 10/19/2015  . Transient alteration of awareness 09/24/2015  . Snoring 06/10/2015  . Sleep apnea 06/10/2015  . 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 Globus sensation 03/30/2015  . Post concussion syndrome 03/13/2015  . Depression 03/13/2015  . Benign hypertensive renal disease   . Barrett esophagus   . Diverticulosis   . Hemiplegic migraine   . Lupus ([place])   . History of stroke   . Fall 05/25/2014   Past Medical History:  Diagnosis Date  . Anxiety    on xanax in the past  . ASCVD (arteriosclerotic cardiovascular disease)    carotids  . Barrett esophagus   . 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 Carotid atherosclerosis 2003   on ultrasound  . Colon polyp   . Complicated migraine    with facial drooping  . Concussion    1974,1982,1987,2016,2016  . DDD (degenerative disc disease), lumbar    MRI 2008, L3-4, L5-S1, spondylotic changes, multilevel facet joint hypertrophic changes  . Depression    on paxil, lexapro,  cymbalta in the past  . Diverticulosis   . Dizziness due to old head injury   . Family history of adverse reaction to anesthesia    father - slow to wake  . Fatty liver 2013   On CT abdomen/pelvis  . Fibrocystic breast disease   . Fibromyalgia   . Fracture    left knee medial femoral condyle stress fracture, chondromalacia  . GERD (gastroesophageal reflux disease)   . Hemiplegic migraine    daily  . Hemorrhoids   . History of kidney stones   . Hyperlipidemia   . Hypertension    no meds currently.  . IBS (irritable bowel syndrome)   . Insomnia chronic  . Kidney stone 2013   on CT abdomen/pelvis  . Lupus ([place]) 1990   multiple plaques on MRI consistent with lupus vasculitis in 2003, question Behcet's   . Migraine   . Narrowing of intervertebral disc space 2011   C5-6  .  Normal cardiac stress test 2009   EF 66%  . Pneumonia   . Pulmonary emboli ([place])   . Recurrent sinusitis   . Recurrent UTI    question about IC in the past  . Sleep apnea    CPAP machine broken  . Stroke ([place])    2000 - no deficits  . Umbilical hernia 7672   on CT abdomen/pelvis  . Varicose veins    right lower leg  . Vascular spasm ([place])   . Vertigo    from concussive disorder.  seeing neuro 03/23/15    Family History  Problem Relation Age of Onset  . Other Mother        IBS  . Hypertension Mother   . Heart disease Father   . Diverticulosis Father   . Migraines Father   . Other Son        Ulcerative Colitis, Polonidal cyst  . Parkinson's disease Maternal Grandmother   . Cancer Maternal Grandmother   . Mental illness Paternal Grandmother   . Stroke Paternal Grandmother   . Cancer Paternal Grandmother        brain cancer  . Bipolar disorder Paternal Grandmother   . Stroke Maternal Grandfather   . Cancer Maternal Grandfather        Bladder, Prostate/bladder/prostate  . Heart disease Paternal Grandfather   . Heart attack Paternal Grandfather   . Macular degeneration Unknown   . Diabetes  Brother   . Breast cancer Maternal Aunt     Past Surgical History:  Procedure Laterality Date  . Biopsy Punch Thyroid    . BREAST EXCISIONAL BIOPSY Right 2000   right mass excision - atypical hyperplasia  . BREAST SURGERY Right    atypical hyperplasia  . CHOLECYSTECTOMY N/A 07/24/2015   Procedure: LAPAROSCOPIC CHOLECYSTECTOMY WITH INTRAOPERATIVE CHOLANGIOGRAM;  Surgeon: Leonie Green, MD;  Location: [facility] ORS;  Service: General;  Laterality: N/A;  . COLONOSCOPY W/ BIOPSIES     Removed 5 polyps  . COLONOSCOPY WITH PROPOFOL N/A 03/03/2017   Procedure: COLONOSCOPY WITH PROPOFOL;  Surgeon: [name], MD;  Location: [facility] ENDOSCOPY;  Service: Gastroenterology;  Laterality: N/A;  . KNEE ARTHROSCOPY WITH SUBCHONDROPLASTY Left 05/26/2017   Procedure: LEFT KNEE ARTHROSCOPY WITH SUBCHONDROPLASTY, CHONDROPLASTY, SYNOVECTOMY, POSSIBLE MICROFX;  Surgeon: Leandrew Koyanagi, MD;  Location: [facility];  Service: Orthopedics;  Laterality: Left;  . 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 Smoking status: Current Some Day Smoker    Types: Cigarettes    Last attempt to quit: 06/12/2015    Years since quitting: 2.4  . Smokeless tobacco: Never Used  . Tobacco comment: 1-2 cigarettes some days  Substance and Sexual Activity  . Alcohol use: No    Alcohol/week: 0.0 oz  . Drug use: No  . Sexual activity: Never    Comment: tubal ligation and menopause

## 2017-11-18 ENCOUNTER — Encounter: Payer: Self-pay | Admitting: Family Medicine

## 2017-11-18 MED ORDER — BUPIVACAINE HCL 0.25 % IJ SOLN
0.6600 mL | INTRAMUSCULAR | Status: AC | PRN
  Administered 2017-11-17: .66 mL via INTRA_ARTICULAR

## 2017-11-18 MED ORDER — METHYLPREDNISOLONE ACETATE 40 MG/ML IJ SUSP
13.3300 mg | INTRAMUSCULAR | Status: AC | PRN
  Administered 2017-11-17: 13.33 mg via INTRA_ARTICULAR

## 2017-11-18 MED ORDER — LIDOCAINE HCL 1 % IJ SOLN
3.0000 mL | INTRAMUSCULAR | Status: AC | PRN
  Administered 2017-11-17: 3 mL

## 2017-11-18 NOTE — Assessment & Plan Note (Signed)
Under fair control. Will work on diet and exercise. Call with any concerns.

## 2017-11-18 NOTE — Assessment & Plan Note (Signed)
Would like to start saxenda. Has tried dietary modifications in the past. Will start it. Recheck 1 month. Call with any concerns.

## 2017-11-18 NOTE — Assessment & Plan Note (Signed)
Doing very well on current regimen. Continue current regimen. Continue to follow with neurology. Call with any concerns.

## 2017-11-18 NOTE — Assessment & Plan Note (Signed)
Under good control. Continue current regimen. Continue to monitor. Call with any concerns. 

## 2017-11-18 NOTE — Assessment & Plan Note (Signed)
Doing well on her tramadol. Refill given today. Continue to monitor. Continue to follow with orthopedics. Call with any concerns.

## 2017-11-18 NOTE — Assessment & Plan Note (Signed)
Stable. Continue current regimen. Continue to monitor. Call with any concerns. Refills given.

## 2017-11-18 NOTE — Assessment & Plan Note (Signed)
Stable. Continue current regimen. Continue to monitor. Call with any concerns. Continue to follow with rheumatology. Call with any concerns.

## 2017-11-18 NOTE — Assessment & Plan Note (Signed)
Using CPAP without issues. If continues with SOB and fatigue- may need to repeat sleep study. Continue to monitor.

## 2017-11-21 ENCOUNTER — Encounter: Payer: Self-pay | Admitting: Family Medicine

## 2017-11-21 DIAGNOSIS — R21 Rash and other nonspecific skin eruption: Secondary | ICD-10-CM

## 2017-11-21 DIAGNOSIS — R519 Headache, unspecified: Secondary | ICD-10-CM

## 2017-11-21 DIAGNOSIS — W57XXXA Bitten or stung by nonvenomous insect and other nonvenomous arthropods, initial encounter: Secondary | ICD-10-CM

## 2017-11-21 DIAGNOSIS — R51 Headache: Principal | ICD-10-CM

## 2017-11-22 ENCOUNTER — Other Ambulatory Visit: Payer: BC Managed Care – PPO

## 2017-11-22 DIAGNOSIS — R21 Rash and other nonspecific skin eruption: Secondary | ICD-10-CM

## 2017-11-22 DIAGNOSIS — W57XXXA Bitten or stung by nonvenomous insect and other nonvenomous arthropods, initial encounter: Secondary | ICD-10-CM

## 2017-11-22 DIAGNOSIS — R519 Headache, unspecified: Secondary | ICD-10-CM

## 2017-11-22 DIAGNOSIS — R51 Headache: Principal | ICD-10-CM

## 2017-11-22 MED ORDER — LIRAGLUTIDE -WEIGHT MANAGEMENT 18 MG/3ML ~~LOC~~ SOPN: PEN_INJECTOR | SUBCUTANEOUS | 3 refills | Status: AC

## 2017-11-22 NOTE — Addendum Note (Signed)
Addended by: Valerie Roys on: 11/22/2017 10:53 AM   Modules accepted: Orders

## 2017-11-24 LAB — EHRLICHIA ANTIBODY PANEL
E. Chaffeensis (HME) IgM Titer: NEGATIVE
E.Chaffeensis (HME) IgG: NEGATIVE
HGE IGM TITER: NEGATIVE
HGE IgG Titer: NEGATIVE

## 2017-11-24 LAB — BABESIA MICROTI ANTIBODY PANEL
Babesia microti IgG: <1:10 {titer}
Babesia microti IgM: <1:10 {titer}

## 2017-11-24 LAB — LYME AB/WESTERN BLOT REFLEX
LYME DISEASE AB, QUANT, IGM: <0.8 index (ref 0.00–0.79)
Lyme IgG/IgM Ab: <0.91 {ISR} (ref 0.00–0.90)

## 2017-11-24 LAB — ROCKY MTN SPOTTED FVR ABS PNL(IGG+IGM)
RMSF IgG: NEGATIVE
RMSF IgM: 0.71 index (ref 0.00–0.89)

## 2017-11-27 ENCOUNTER — Telehealth: Payer: Self-pay

## 2017-11-27 NOTE — Telephone Encounter (Signed)
PA submitted vis Cover my meds for Saxenda.

## 2017-11-28 ENCOUNTER — Encounter: Payer: Self-pay | Admitting: Family Medicine

## 2017-11-28 MED ORDER — INSULIN PEN NEEDLE 31G X 6 MM MISC: 1.0000 | Freq: Every day | 12 refills | Status: DC

## 2017-12-06 ENCOUNTER — Encounter: Payer: Self-pay | Admitting: Emergency Medicine

## 2017-12-06 ENCOUNTER — Emergency Department: Payer: BC Managed Care – PPO

## 2017-12-06 ENCOUNTER — Emergency Department
Admission: EM | Admit: 2017-12-06 | Discharge: 2017-12-06 | Disposition: A | Discharge status: Home / Self Care | Payer: BC Managed Care – PPO | Attending: Emergency Medicine | Admitting: Emergency Medicine

## 2017-12-06 DIAGNOSIS — F17210 Nicotine dependence, cigarettes, uncomplicated: Secondary | ICD-10-CM | POA: Diagnosis not present

## 2017-12-06 DIAGNOSIS — Z79899 Other long term (current) drug therapy: Secondary | ICD-10-CM | POA: Insufficient documentation

## 2017-12-06 DIAGNOSIS — Z91040 Latex allergy status: Secondary | ICD-10-CM | POA: Diagnosis not present

## 2017-12-06 DIAGNOSIS — R1013 Epigastric pain: Secondary | ICD-10-CM | POA: Diagnosis not present

## 2017-12-06 DIAGNOSIS — R079 Chest pain, unspecified: Secondary | ICD-10-CM | POA: Diagnosis present

## 2017-12-06 DIAGNOSIS — Z7901 Long term (current) use of anticoagulants: Secondary | ICD-10-CM | POA: Insufficient documentation

## 2017-12-06 DIAGNOSIS — Z8673 Personal history of transient ischemic attack (TIA), and cerebral infarction without residual deficits: Secondary | ICD-10-CM | POA: Diagnosis not present

## 2017-12-06 LAB — BASIC METABOLIC PANEL
Anion gap: 10 (ref 5–15)
BUN: 15 mg/dL (ref 6–20)
CALCIUM: 10.3 mg/dL (ref 8.9–10.3)
CO2: 29 mmol/L (ref 22–32)
CREATININE: 1.1 mg/dL — AB (ref 0.44–1.00)
Chloride: 103 mmol/L (ref 98–111)
GFR calc Af Amer: >60 mL/min (ref >60)
GFR, EST NON AFRICAN AMERICAN: 56 mL/min — AB (ref >60)
Glucose, Bld: 100 mg/dL — ABNORMAL HIGH (ref 70–99)
Potassium: 3.5 mmol/L (ref 3.5–5.1)
SODIUM: 142 mmol/L (ref 135–145)

## 2017-12-06 LAB — HEPATIC FUNCTION PANEL
ALT: 12 U/L (ref 0–44)
AST: 21 U/L (ref 15–41)
Albumin: 4.2 g/dL (ref 3.5–5.0)
Alkaline Phosphatase: 53 U/L (ref 38–126)
BILIRUBIN DIRECT: 0.1 mg/dL (ref 0.0–0.2)
Indirect Bilirubin: 0.4 mg/dL (ref 0.3–0.9)
TOTAL PROTEIN: 7.4 g/dL (ref 6.5–8.1)
Total Bilirubin: 0.5 mg/dL (ref 0.3–1.2)

## 2017-12-06 LAB — CBC
HCT: 43.2 % (ref 35.0–47.0)
Hemoglobin: 14.8 g/dL (ref 12.0–16.0)
MCH: 30.8 pg (ref 26.0–34.0)
MCHC: 34.3 g/dL (ref 32.0–36.0)
MCV: 90 fL (ref 80.0–100.0)
PLATELETS: 240 10*3/uL (ref 150–440)
RBC: 4.8 MIL/uL (ref 3.80–5.20)
RDW: 12.8 % (ref 11.5–14.5)
WBC: 6.5 10*3/uL (ref 3.6–11.0)

## 2017-12-06 LAB — TROPONIN I

## 2017-12-06 LAB — LIPASE, BLOOD: Lipase: 27 U/L (ref 11–51)

## 2017-12-06 MED ORDER — FAMOTIDINE 20 MG PO TABS
40.0000 mg | ORAL_TABLET | Freq: Once | ORAL | Status: AC
Start: 2017-12-06 — End: 2017-12-06
  Administered 2017-12-06: 40 mg via ORAL
  Filled 2017-12-06: qty 2

## 2017-12-06 MED ORDER — ALUM & MAG HYDROXIDE-SIMETH 200-200-20 MG/5ML PO SUSP
30.0000 mL | Freq: Once | ORAL | Status: AC
  Administered 2017-12-06: 30 mL via ORAL
  Filled 2017-12-06: qty 30

## 2017-12-06 NOTE — ED Triage Notes (Signed)
Pt reports left shoulder pain, shortness of breath, lightheaded, dizziness, left sided chest pain and pain under left breast. Describes pain as indigestion, reports taking tums without relief. Pt reports vomiting at 2am.

## 2017-12-06 NOTE — ED Notes (Signed)
See triage note.  Says pain is 5/10 in left upper chest and shoulder blade in back.

## 2017-12-06 NOTE — ED Provider Notes (Signed)
[facility] Emergency Department Provider Note  ____________________________________________   First MD Initiated Contact with Patient 12/06/17 1649     (approximate)  I have reviewed the triage vital signs and the nursing notes.   HISTORY  Chief Complaint Chest Pain   HPI Shirley Rogers is a 52 y.o. female who self presents the emergency department with left shoulder pain shortness of breath epigastric discomfort and sharp lower chest pain that she describes as "indigestion" that began earlier this evening.  She vomited once.  No diarrhea.  Pain is nonexertional.  It is burning and aching.  It is not ripping or tearing does not go straight to her back.  She has a long-standing history of gastric disease.  She took Tums with no relief which prompted the visit today.    Past Medical History:  Diagnosis Date  . Anxiety    on xanax in the past  . ASCVD (arteriosclerotic cardiovascular disease)    carotids  . Barrett esophagus   . 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 Carotid atherosclerosis 2003   on ultrasound  . Colon polyp   . Complicated migraine    with facial drooping  . Concussion    1974,1982,1987,2016,2016  . DDD (degenerative disc disease), lumbar    MRI 2008, L3-4, L5-S1, spondylotic changes, multilevel facet joint hypertrophic changes  . Depression    on paxil, lexapro, cymbalta in the past  . Diverticulosis   . Dizziness due to old head injury   . Family history of adverse reaction to anesthesia    father - slow to wake  . Fatty liver 2013   On CT abdomen/pelvis  . Fibrocystic breast disease   . Fibromyalgia   . Fracture    left knee medial femoral condyle stress fracture, chondromalacia  . GERD (gastroesophageal reflux disease)   . Hemiplegic migraine    daily  . Hemorrhoids   . History of kidney stones   . Hyperlipidemia   . Hypertension    no meds currently.  . IBS (irritable bowel syndrome)   . Insomnia chronic  . Kidney stone  2013   on CT abdomen/pelvis  . 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 Migraine   . Narrowing of intervertebral disc space 2011   C5-6  . Normal cardiac stress test 2009   EF 66%  . Pneumonia   . Pulmonary emboli ([place])   . Recurrent sinusitis   . Recurrent UTI    question about IC in the past  . Sleep apnea    CPAP machine broken  . Stroke ([place])    2000 - no deficits  . Umbilical hernia 3295   on CT abdomen/pelvis  . Varicose veins    right lower leg  . Vascular spasm ([place])   . Vertigo    from concussive disorder.  seeing neuro 03/23/15    Patient Active Problem List   Diagnosis Date Noted  . Chronic pain of both knees 11/17/2017  . Morbid obesity ([place]) 11/17/2017  . Body mass index 40.0-44.9, adult ([place]) 11/17/2017  . Unilateral primary osteoarthritis, right knee 07/11/2017  . S/P left knee arthroscopy 06/06/2017  . Stress fracture of femur 05/16/2017  . Chondromalacia patellae, left knee 05/16/2017  . OA (osteoarthritis) of knee 04/26/2017  . ASCUS with positive high risk HPV cervical 01/18/2017  . Increased intracranial pressure 10/06/2016  . Vestibular hypofunction of left ear 09/21/2016  .  Intracranial hypertension, benign 09/21/2016  . Chronic daily headache 09/21/2016  . Inflammatory arthritis 08/28/2016  . Nephrolithiasis 07/22/2016  . Dysuria 07/22/2016  . 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 Behcet's disease ([place]) 04/24/2016  . Microhematuria 03/15/2016  . Pelvic floor dysfunction 03/15/2016  . Urethral caruncle 03/15/2016  . History of pulmonary embolus (PE) 03/11/2016  . Facial droop 02/04/2016  . Right hemiparesis ([place]) 02/04/2016  . Tobacco use 12/03/2015  . ANA positive 10/19/2015  . Transient alteration of awareness 09/24/2015  . Snoring 06/10/2015  . Sleep apnea 06/10/2015  . 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 Solitary nodule of right lobe of thyroid 04/14/2015    . Globus sensation 03/30/2015  . Post concussion syndrome 03/13/2015  . Depression 03/13/2015  . Benign hypertensive renal disease   . Barrett esophagus   . Diverticulosis   . Hemiplegic migraine   . Lupus ([place])   . History of stroke   . Fall 05/25/2014    Past Surgical History:  Procedure Laterality Date  . Biopsy Punch Thyroid    . BREAST EXCISIONAL BIOPSY Right 2000   right mass excision - atypical hyperplasia  . BREAST SURGERY Right    atypical hyperplasia  . CHOLECYSTECTOMY N/A 07/24/2015   Procedure: LAPAROSCOPIC CHOLECYSTECTOMY WITH INTRAOPERATIVE CHOLANGIOGRAM;  Surgeon: Leonie Green, MD;  Location: [facility] ORS;  Service: General;  Laterality: N/A;  . COLONOSCOPY W/ BIOPSIES     Removed 5 polyps  . COLONOSCOPY WITH PROPOFOL N/A 03/03/2017   Procedure: COLONOSCOPY WITH PROPOFOL;  Surgeon: [name], MD;  Location: [facility] ENDOSCOPY;  Service: Gastroenterology;  Laterality: N/A;  . KNEE ARTHROSCOPY WITH SUBCHONDROPLASTY Left 05/26/2017   Procedure: LEFT KNEE ARTHROSCOPY WITH SUBCHONDROPLASTY, CHONDROPLASTY, SYNOVECTOMY, POSSIBLE MICROFX;  Surgeon: Leandrew Koyanagi, MD;  Location: [facility];  Service: Orthopedics;  Laterality: Left;  . TUBAL LIGATION      Prior to Admission medications   Medication Sig Start Date End Date Taking? Authorizing Provider  apixaban (ELIQUIS) 5 MG TABS tablet Take 1 tablet (5 mg total) by mouth 2 (two) times daily. 12/13/16  Yes Johnson, Megan P, DO  b complex vitamins tablet Take 1 tablet by mouth daily.   Yes [provider]  buPROPion (WELLBUTRIN SR) 150 MG 12 hr tablet TAKE 1 TABLET BY MOUTH EVERY MORNING FOR 3 DAYS, THEN 1 TABLET TWICE DAILY THEREAFTER 10/08/17  Yes Johnson, Megan P, DO  busPIRone (BUSPAR) 15 MG tablet Take 1 tablet (15 mg total) by mouth 3 (three) times daily as needed. 12/13/16  Yes Johnson, Megan P, DO  calcium-vitamin D (OSCAL WITH D) 500-200 MG-UNIT tablet Take 1 tablet by mouth 3 (three) times daily. 05/26/17  Yes Leandrew Koyanagi, MD  EPINEPHRINE 0.3 mg/0.3 mL IJ SOAJ injection INJECT 0.3 ML( 0.3 MG) INTO THE MUSCLE ONCE 12/05/16  Yes Johnson, Megan P, DO  furosemide (LASIX) 20 MG tablet TAKE 1 TABLET(20 MG) BY MOUTH DAILY 09/14/17  Yes Johnson, Megan P, DO  hydrochlorothiazide (HYDRODIURIL) 25 MG tablet TAKE 1 TABLET(25 MG) BY MOUTH DAILY Patient taking differently: TAKE 1 TABLET(25 MG) BY MOUTH DAILY AT NIGHT 02/17/17  Yes Johnson, Megan P, DO  Insulin Pen Needle 31G X 6 MM MISC 1 each by Does not apply route daily. 11/28/17  Yes Johnson, Megan P, DO  Liraglutide -Weight Management (SAXENDA) 18 MG/3ML SOPN Inject 0.6 mg into the skin daily for 7 days, THEN 1.2 mg daily for 7 days, THEN 1.8 mg daily for 7 days, THEN 2.4 mg daily  for 7 days, THEN 3 mg daily. 11/22/17 03/20/18 Yes Johnson, Megan P, DO  Magnesium 250 MG TABS Take 1,000 mg by mouth daily.   Yes [provider]  methazolamide (NEPTAZANE) 25 MG tablet Take 1 tablet (25 mg total) by mouth 3 (three) times daily. 04/26/17  Yes Volney American, PA-C  omeprazole (PRILOSEC) 20 MG capsule Take 20 mg by mouth at bedtime.   Yes [provider]  azaTHIOprine (IMURAN) 50 MG tablet Take 50 mg by mouth 2 (two) times daily. 04/25/16   [provider]  bacitracin-polymyxin b (POLYSPORIN) ophthalmic ointment Place 1 application into both eyes every 12 (twelve) hours. apply to eye every 12 hours while awake 08/10/17   Johnson, Megan P, DO  citalopram (CELEXA) 20 MG tablet TAKE 1 AND 1/2 TABLETS(30 MG) BY MOUTH DAILY 09/14/17   Park Liter P, DO  diphenhydrAMINE (BENADRYL) 25 mg capsule Take 2 capsules (50 mg total) by mouth every 6 (six) hours as needed. Patient taking differently: Take 50 mg by mouth every 6 (six) hours as needed (for itching/allergies.).  08/02/16   Park Liter P, DO  DULoxetine (CYMBALTA) 60 MG capsule Take 1 capsule (60 mg total) by mouth daily. 06/23/17   Valerie Roys, DO  Elastic Bandages & Supports (T.E.D. THIGH  LENGTH/L-REGULAR) MISC 2 application by Does not apply route every morning. 03/18/16   Merlyn Lot, MD  enoxaparin (LOVENOX) 40 MG/0.4ML injection Inject 0.4 mLs (40 mg total) into the skin every 12 (twelve) hours. 05/18/17   Johnson, Megan P, DO  ondansetron (ZOFRAN) 4 MG tablet Take 1-2 tablets (4-8 mg total) by mouth every 8 (eight) hours as needed for nausea or vomiting. 05/26/17   Leandrew Koyanagi, MD  oxyCODONE-acetaminophen (PERCOCET) 5-325 MG tablet Take 1-2 tablets by mouth every 4 (four) hours as needed for severe pain. 05/26/17   Leandrew Koyanagi, MD  predniSONE (DELTASONE) 50 MG tablet Take 1 tablet (50 mg total) by mouth daily with breakfast. 03/29/17   Park Liter P, DO  pregabalin (LYRICA) 75 MG capsule Take 75 mg by mouth at bedtime.    [provider]  PROAIR HFA 108 (90 Base) MCG/ACT inhaler INHALE 2 PUFFS BY MOUTH EVERY 4 HOURS AS NEEDED FOR WHEEZING 09/26/16   Johnson, Megan P, DO  senna-docusate (SENOKOT S) 8.6-50 MG tablet Take 1 tablet by mouth at bedtime as needed. 05/26/17   Leandrew Koyanagi, MD  traMADol (ULTRAM) 50 MG tablet Take 1 tablet (50 mg total) by mouth 2 (two) times daily as needed. 10/20/17   Johnson, Megan P, DO  traZODone (DESYREL) 100 MG tablet TAKE 1 TABLET(100 MG) BY MOUTH AT BEDTIME AS NEEDED FOR SLEEP 10/12/17   Johnson, Megan P, DO  valACYclovir (VALTREX) 1000 MG tablet Take 1 tablet (1,000 mg total) by mouth 3 (three) times daily. 04/26/17   Kathrine Haddock, NP  verapamil (CALAN-SR) 240 MG CR tablet TAKE 1 TABLET(240 MG) BY MOUTH DAILY 07/11/17   Park Liter P, DO    Allergies Rocephin [ceftriaxone]; Statins; Sulfa antibiotics; Valproate sodium; Compazine [prochlorperazine]; Erythromycin; Lidocaine; Meloxicam; Nitrofuran derivatives; Ace inhibitors; Biaxin [clarithromycin]; Doxycycline; Latex; Morphine and related; and Sulfamethoxazole-trimethoprim  Family History  Problem Relation Age of Onset  . Other Mother        IBS  . Hypertension Mother    . Heart disease Father   . Diverticulosis Father   . Migraines Father   . Other Son        Ulcerative Colitis, Polonidal  cyst  . Parkinson's disease Maternal Grandmother   . Cancer Maternal Grandmother   . Mental illness Paternal Grandmother   . Stroke Paternal Grandmother   . Cancer Paternal Grandmother        brain cancer  . Bipolar disorder Paternal Grandmother   . Stroke Maternal Grandfather   . Cancer Maternal Grandfather        Bladder, Prostate/bladder/prostate  . Heart disease Paternal Grandfather   . Heart attack Paternal Grandfather   . Macular degeneration Unknown   . Diabetes Brother   . Breast cancer Maternal Aunt     Social History Social History   Tobacco Use  . Smoking status: Current Some Day Smoker    Types: Cigarettes    Last attempt to quit: 06/12/2015    Years since quitting: 2.4  . Smokeless tobacco: Never Used  . Tobacco comment: 1-2 cigarettes some days  Substance Use Topics  . Alcohol use: No    Alcohol/week: 0.0 oz  . Drug use: No    Review of Systems Constitutional: No fever/chills Eyes: No visual changes. ENT: No sore throat. Cardiovascular: Positive for chest pain. Respiratory: Positive for shortness of breath. Gastrointestinal: Positive for abdominal pain.  Positive for nausea, positive for vomiting.  No diarrhea.  No constipation. Genitourinary: Negative for dysuria. Musculoskeletal: Negative for back pain. Skin: Negative for rash. Neurological: Negative for headaches, focal weakness or numbness.   ____________________________________________   PHYSICAL EXAM:  VITAL SIGNS: ED Triage Vitals  Enc Vitals Group     BP 12/06/17 1515 135/87     Pulse Rate 12/06/17 1515 82     Resp 12/06/17 1515 14     Temp 12/06/17 1515 98.4 F (36.9 C)     Temp Source 12/06/17 1515 Oral     SpO2 12/06/17 1515 100 %     Weight 12/06/17 1517 245 lb (111.1 kg)     Height 12/06/17 1517 5\' 5"  (1.651 m)     Head Circumference --      Peak Flow  --      Pain Score 12/06/17 1517 6     Pain Loc --      Pain Edu? --      Excl. in [place]? --     Constitutional: Alert and oriented x4 anxious appearing holding her upper abdomen Eyes: PERRL EOMI. Head: Atraumatic. Nose: No congestion/rhinnorhea. Mouth/Throat: No trismus Neck: No stridor.   Cardiovascular: Normal rate, regular rhythm. Grossly normal heart sounds.  Good peripheral circulation. Respiratory: Normal respiratory effort.  No retractions. Lungs CTAB and moving good air Gastrointestinal: Soft nontender no peritonitis Musculoskeletal: No lower extremity edema   Neurologic:  Normal speech and language. No gross focal neurologic deficits are appreciated. Skin:  Skin is warm, dry and intact. No rash noted. Psychiatric: Anxious appearing    ____________________________________________   DIFFERENTIAL includes but not limited to  Pancreatitis, acute coronary syndrome, gastritis, gastric reflux ____________________________________________   LABS (all labs ordered are listed, but only abnormal results are displayed)  Labs Reviewed  BASIC METABOLIC PANEL - Abnormal; Notable for the following components:      Result Value   Glucose, Bld 100 (*)    Creatinine, Ser 1.10 (*)    GFR calc non Af Amer 56 (*)    All other components within normal limits  CBC  TROPONIN I  HEPATIC FUNCTION PANEL  LIPASE, BLOOD    Lab work reviewed by me with no acute disease __________________________________________  EKG  ED ECG REPORT I, Milta Deiters  Bharat Antillon, the attending physician, personally viewed and interpreted this ECG.  Date: 12/06/2017 EKG Time:  Rate: 86 Rhythm: normal sinus rhythm QRS Axis: normal Intervals: normal ST/T Wave abnormalities: normal Narrative Interpretation: no evidence of acute ischemia  ____________________________________________  RADIOLOGY  Chest x-ray reviewed by me with no acute  disease ____________________________________________   PROCEDURES  Procedure(s) performed: no  Procedures  Critical Care performed: no  ____________________________________________   INITIAL IMPRESSION / ASSESSMENT AND PLAN / ED COURSE  Pertinent labs & imaging results that were available during my care of the patient were reviewed by me and considered in my medical decision making (see chart for details).   The patient arrives somewhat uncomfortable appearing with a history most consistent with gastric etiology.  Given a GI cocktail and Carafate with resolution of her symptoms.  Lab work is reassuring.  I will refer her back to her primary care physician for further evaluation and possible endoscopy.  She verbalizes understanding agreement plan.      ____________________________________________   FINAL CLINICAL IMPRESSION(S) / ED DIAGNOSES  Final diagnoses:  Epigastric pain      NEW MEDICATIONS STARTED DURING THIS VISIT:  Discharge Medication List as of 12/06/2017  6:36 PM       Note:  This document was prepared using Dragon voice recognition software and may include unintentional dictation errors.     Darel Hong, MD 12/09/17 [phone]

## 2017-12-06 NOTE — Discharge Instructions (Signed)
It was a pleasure to take care of you today, and thank you for coming to our emergency department.  If you have any questions or concerns before leaving please ask the nurse to grab me and I'm more than happy to go through your aftercare instructions again.  If you were prescribed any opioid pain medication today such as Norco, Vicodin, Percocet, morphine, hydrocodone, or oxycodone please make sure you do not drive when you are taking this medication as it can alter your ability to drive safely.  If you have any concerns once you are home that you are not improving or are in fact getting worse before you can make it to your follow-up appointment, please do not hesitate to call [phone] and come back for further evaluation.  Darel Hong, MD  Results for orders placed or performed during the hospital encounter of 65/99/35  Basic metabolic panel  Result Value Ref Range   Sodium 142 135 - 145 mmol/L   Potassium 3.5 3.5 - 5.1 mmol/L   Chloride 103 98 - 111 mmol/L   CO2 29 22 - 32 mmol/L   Glucose, Bld 100 (H) 70 - 99 mg/dL   BUN 15 6 - 20 mg/dL   Creatinine, Ser 1.10 (H) 0.44 - 1.00 mg/dL   Calcium 10.3 8.9 - 10.3 mg/dL   GFR calc non Af Amer 56 (L) >60 mL/min   GFR calc Af Amer >60 >60 mL/min   Anion gap 10 5 - 15  CBC  Result Value Ref Range   WBC 6.5 3.6 - 11.0 K/uL   RBC 4.80 3.80 - 5.20 MIL/uL   Hemoglobin 14.8 12.0 - 16.0 g/dL   HCT 43.2 35.0 - 47.0 %   MCV 90.0 80.0 - 100.0 fL   MCH 30.8 26.0 - 34.0 pg   MCHC 34.3 32.0 - 36.0 g/dL   RDW 12.8 11.5 - 14.5 %   Platelets 240 150 - 440 K/uL  Troponin I  Result Value Ref Range   Troponin I <0.03 <0.03 ng/mL  Hepatic function panel  Result Value Ref Range   Total Protein 7.4 6.5 - 8.1 g/dL   Albumin 4.2 3.5 - 5.0 g/dL   AST 21 15 - 41 U/L   ALT 12 0 - 44 U/L   Alkaline Phosphatase 53 38 - 126 U/L   Total Bilirubin 0.5 0.3 - 1.2 mg/dL   Bilirubin, Direct 0.1 0.0 - 0.2 mg/dL   Indirect Bilirubin 0.4 0.3 - 0.9 mg/dL  Lipase, blood   Result Value Ref Range   Lipase 27 11 - 51 U/L   Dg Chest 2 View  Result Date: 12/06/2017 CLINICAL DATA:  Chest pain EXAM: CHEST - 2 VIEW COMPARISON:  02/15/2017 chest radiograph. FINDINGS: Stable cardiomediastinal silhouette with normal heart size. No pneumothorax. No pleural effusion. Lungs appear clear, with no acute consolidative airspace disease and no pulmonary edema. IMPRESSION: No active cardiopulmonary disease. Electronically Signed   By: Ilona Sorrel M.D.   On: 12/06/2017 16:21   Xr Knee 3 View Left  Result Date: 11/18/2017 X-rays show moderate joint space narrowing medial compartment both sides

## 2017-12-11 ENCOUNTER — Telehealth: Payer: Self-pay | Admitting: Gastroenterology

## 2017-12-11 NOTE — Telephone Encounter (Signed)
Pt is calling to schedule Upper GI and  Colonoscopy cb [phone]

## 2017-12-12 NOTE — Telephone Encounter (Signed)
Shirley Rogers has been asked to schedule an office visit as to this patient was seen in ER on 12/06/17 has never been seen in office.

## 2017-12-13 ENCOUNTER — Ambulatory Visit: Payer: BC Managed Care – PPO | Admitting: Gastroenterology

## 2017-12-19 ENCOUNTER — Other Ambulatory Visit: Payer: Self-pay | Admitting: Family Medicine

## 2017-12-21 ENCOUNTER — Other Ambulatory Visit: Payer: Self-pay

## 2017-12-21 ENCOUNTER — Encounter: Payer: Self-pay | Admitting: Gastroenterology

## 2017-12-21 ENCOUNTER — Encounter: Payer: Self-pay | Admitting: Family Medicine

## 2017-12-21 ENCOUNTER — Ambulatory Visit: Payer: BC Managed Care – PPO | Admitting: Gastroenterology

## 2017-12-21 VITALS — BP 131/85 | HR 87 | Ht 65.0 in | Wt 241.8 lb

## 2017-12-21 DIAGNOSIS — K581 Irritable bowel syndrome with constipation: Secondary | ICD-10-CM | POA: Diagnosis not present

## 2017-12-21 DIAGNOSIS — R1013 Epigastric pain: Secondary | ICD-10-CM

## 2017-12-21 DIAGNOSIS — G8929 Other chronic pain: Secondary | ICD-10-CM

## 2017-12-21 NOTE — Progress Notes (Signed)
[name] MD, MRCP(U.K) [address]  [place]  [place], [place] [address]  Main: [phone]  Fax: [phone]   Gastroenterology Consultation  Referring Provider:     Valerie Roys, DO Primary Care Physician:  Valerie Roys, DO Primary Gastroenterologist:  Dr. [name]  Reason for Consultation:     Abdominal Pain         HPI:   Shirley Rogers is a 52 y.o. y/o female referred for consultation & management  by Dr. Wynetta Emery, Megan P, DO.    She has been referred from the ER when she presented on 12/06/17 with abdominal pain. It had begun earlier that day associated with nausea and vomiting. She is on Eliquis.  She has been on oxycodone.  She was given some Carafate and a GI cocktail which resolved her abdominal pain and she was discharged.  No recent abdominal imaging.  Labs 12/06/2017 lipase normal, LFTs normal and his CBC was also normal.  It appears she was seen by [facility] back in December 2016 for abdominal pain.  At that time she had a history of constipation diarrhea intentional weight loss.  History of hard stools at some points.  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  CBD was 7 mm in diameter and slightly prominent but no filling defects noted sludge is noted in the gallbladder she is a type I hiatal hernia.  I performed a colonoscopy in October 2018.  The prep was poor and I suggested to repeat a colonoscopy.  Repeat was not scheduled and had not been performed since.  Abdominal pain: Onset: All began 2 months , getting worse, continuous, wakes her up from sleep .  Site :Epigastric and lower abdominal  Radiation: localized  Severity :7/10  Nature of pain: epigastric- like a knife and lower abdomen is a tooth ache types  Aggravating factors: eating , even before eating when she smells the food, nausea + Relieving  factors :nothing  Weight loss: lost - intentionally  NSAID use: alleve occasionally  PPI use :prilosec - daily - at night  Gall bladder surgery: taken out  Frequency of bowel movements: may be once a week . Pain more intense when not had a bowel movement  Change in bowel movements: no  Relief with bowel movements: yes , very hard stools  Gas/Bloating/Abdominal distension: yes , more bloated on days she has not had a bowel movement .   No blood in her stool.   EGD 8 years back  . Stopped oxycodone, on tramadol, as needed    Past Medical History:  Diagnosis Date  . Anxiety    on xanax in the past  . ASCVD (arteriosclerotic cardiovascular disease)    carotids  . Barrett esophagus   . 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 Carotid atherosclerosis 2003   on ultrasound  . Colon polyp   . Complicated migraine    with facial drooping  . Concussion    1974,1982,1987,2016,2016  . DDD (degenerative disc disease), lumbar    MRI 2008, L3-4, L5-S1, spondylotic changes, multilevel facet joint hypertrophic changes  . Depression    on paxil, lexapro, cymbalta in the past  . Diverticulosis   . Dizziness due to old head injury   . Family history of adverse reaction to anesthesia    father - slow to wake  .  Fatty liver 2013   On CT abdomen/pelvis  . Fibrocystic breast disease   . Fibromyalgia   . Fracture    left knee medial femoral condyle stress fracture, chondromalacia  . GERD (gastroesophageal reflux disease)   . Hemiplegic migraine    daily  . Hemorrhoids   . History of kidney stones   . Hyperlipidemia   . Hypertension    no meds currently.  . IBS (irritable bowel syndrome)   . Insomnia chronic  . Kidney stone 2013   on CT abdomen/pelvis  . 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 Migraine   . Narrowing of intervertebral disc space 2011   C5-6  . Normal cardiac stress test 2009   EF 66%  . Pneumonia   . Pulmonary  emboli ([place])   . Recurrent sinusitis   . Recurrent UTI    question about IC in the past  . Sleep apnea    CPAP machine broken  . Stroke ([place])    2000 - no deficits  . Umbilical hernia 0867   on CT abdomen/pelvis  . Varicose veins    right lower leg  . Vascular spasm ([place])   . Vertigo    from concussive disorder.  seeing neuro 03/23/15    Past Surgical History:  Procedure Laterality Date  . Biopsy Punch Thyroid    . BREAST EXCISIONAL BIOPSY Right 2000   right mass excision - atypical hyperplasia  . BREAST SURGERY Right    atypical hyperplasia  . CHOLECYSTECTOMY N/A 07/24/2015   Procedure: LAPAROSCOPIC CHOLECYSTECTOMY WITH INTRAOPERATIVE CHOLANGIOGRAM;  Surgeon: Leonie Green, MD;  Location: [facility] ORS;  Service: General;  Laterality: N/A;  . COLONOSCOPY W/ BIOPSIES     Removed 5 polyps  . COLONOSCOPY WITH PROPOFOL N/A 03/03/2017   Procedure: COLONOSCOPY WITH PROPOFOL;  Surgeon: [name], MD;  Location[facility] ENDOSCOPY;  Service: Gastroenterology;  Laterality: N/A;  . KNEE ARTHROSCOPY WITH SUBCHONDROPLASTY Left 05/26/2017   Procedure: LEFT KNEE ARTHROSCOPY WITH SUBCHONDROPLASTY, CHONDROPLASTY, SYNOVECTOMY, POSSIBLE MICROFX;  Surgeon: Leandrew Koyanagi, MD;  Location: [facility];  Service: Orthopedics;  Laterality: Left;  . TUBAL LIGATION      Prior to Admission medications   Medication Sig Start Date End Date Taking? Authorizing Provider  azaTHIOprine (IMURAN) 50 MG tablet Take 50 mg by mouth 2 (two) times daily. 04/25/16   [provider]  b complex vitamins tablet Take 1 tablet by mouth daily.    [provider]  bacitracin-polymyxin b (POLYSPORIN) ophthalmic ointment Place 1 application into both eyes every 12 (twelve) hours. apply to eye every 12 hours while awake 08/10/17   Johnson, Megan P, DO  buPROPion (WELLBUTRIN SR) 150 MG 12 hr tablet TAKE 1 TABLET BY MOUTH EVERY MORNING FOR 3 DAYS, THEN 1 TABLET TWICE DAILY THEREAFTER 10/08/17   Johnson, Megan P, DO    busPIRone (BUSPAR) 15 MG tablet Take 1 tablet (15 mg total) by mouth 3 (three) times daily as needed. 12/13/16   Johnson, Megan P, DO  calcium-vitamin D (OSCAL WITH D) 500-200 MG-UNIT tablet Take 1 tablet by mouth 3 (three) times daily. 05/26/17   Leandrew Koyanagi, MD  citalopram (CELEXA) 20 MG tablet TAKE 1 AND 1/2 TABLETS(30 MG) BY MOUTH DAILY 09/14/17   Park Liter P, DO  diphenhydrAMINE (BENADRYL) 25 mg capsule Take 2 capsules (50 mg total) by mouth every 6 (six) hours as needed. Patient taking differently: Take 50  mg by mouth every 6 (six) hours as needed (for itching/allergies.).  08/02/16   Park Liter P, DO  DULoxetine (CYMBALTA) 60 MG capsule Take 1 capsule (60 mg total) by mouth daily. 06/23/17   Valerie Roys, DO  Elastic Bandages & Supports (T.E.D. THIGH LENGTH/L-REGULAR) MISC 2 application by Does not apply route every morning. 03/18/16   Merlyn Lot, MD  ELIQUIS 5 MG TABS tablet TAKE 1 TABLET(5 MG) BY MOUTH TWICE DAILY 12/19/17   Johnson, Megan P, DO  enoxaparin (LOVENOX) 40 MG/0.4ML injection Inject 0.4 mLs (40 mg total) into the skin every 12 (twelve) hours. 05/18/17   Johnson, Megan P, DO  EPINEPHRINE 0.3 mg/0.3 mL IJ SOAJ injection INJECT 0.3 ML( 0.3 MG) INTO THE MUSCLE ONCE 12/05/16   Johnson, Megan P, DO  furosemide (LASIX) 20 MG tablet TAKE 1 TABLET(20 MG) BY MOUTH DAILY 09/14/17   Johnson, Megan P, DO  hydrochlorothiazide (HYDRODIURIL) 25 MG tablet TAKE 1 TABLET(25 MG) BY MOUTH DAILY Patient taking differently: TAKE 1 TABLET(25 MG) BY MOUTH DAILY AT NIGHT 02/17/17   Johnson, Megan P, DO  Insulin Pen Needle 31G X 6 MM MISC 1 each by Does not apply route daily. 11/28/17   Johnson, Megan P, DO  Liraglutide -Weight Management (SAXENDA) 18 MG/3ML SOPN Inject 0.6 mg into the skin daily for 7 days, THEN 1.2 mg daily for 7 days, THEN 1.8 mg daily for 7 days, THEN 2.4 mg daily for 7 days, THEN 3 mg daily. 11/22/17 03/20/18  Park Liter P, DO  Magnesium 250 MG TABS Take 1,000 mg by  mouth daily.    [provider]  methazolamide (NEPTAZANE) 25 MG tablet Take 1 tablet (25 mg total) by mouth 3 (three) times daily. 04/26/17   Volney American, PA-C  omeprazole (PRILOSEC) 20 MG capsule Take 20 mg by mouth at bedtime.    [provider]  ondansetron (ZOFRAN) 4 MG tablet Take 1-2 tablets (4-8 mg total) by mouth every 8 (eight) hours as needed for nausea or vomiting. 05/26/17   Leandrew Koyanagi, MD  oxyCODONE-acetaminophen (PERCOCET) 5-325 MG tablet Take 1-2 tablets by mouth every 4 (four) hours as needed for severe pain. 05/26/17   Leandrew Koyanagi, MD  predniSONE (DELTASONE) 50 MG tablet Take 1 tablet (50 mg total) by mouth daily with breakfast. 03/29/17   Park Liter P, DO  pregabalin (LYRICA) 75 MG capsule Take 75 mg by mouth at bedtime.    [provider]  PROAIR HFA 108 (90 Base) MCG/ACT inhaler INHALE 2 PUFFS BY MOUTH EVERY 4 HOURS AS NEEDED FOR WHEEZING 09/26/16   Johnson, Megan P, DO  senna-docusate (SENOKOT S) 8.6-50 MG tablet Take 1 tablet by mouth at bedtime as needed. 05/26/17   Leandrew Koyanagi, MD  traMADol (ULTRAM) 50 MG tablet Take 1 tablet (50 mg total) by mouth 2 (two) times daily as needed. 10/20/17   Johnson, Megan P, DO  traZODone (DESYREL) 100 MG tablet TAKE 1 TABLET(100 MG) BY MOUTH AT BEDTIME AS NEEDED FOR SLEEP 10/12/17   Johnson, Megan P, DO  valACYclovir (VALTREX) 1000 MG tablet Take 1 tablet (1,000 mg total) by mouth 3 (three) times daily. 04/26/17   Kathrine Haddock, NP  verapamil (CALAN-SR) 240 MG CR tablet TAKE 1 TABLET(240 MG) BY MOUTH DAILY 07/11/17   Valerie Roys, DO    Family History  Problem Relation Age of Onset  . Other Mother        IBS  . Hypertension Mother   .  Heart disease Father   . Diverticulosis Father   . Migraines Father   . Other Son        Ulcerative Colitis, Polonidal cyst  . Parkinson's disease Maternal Grandmother   . Cancer Maternal Grandmother   . Mental illness Paternal Grandmother   . Stroke  Paternal Grandmother   . Cancer Paternal Grandmother        brain cancer  . Bipolar disorder Paternal Grandmother   . Stroke Maternal Grandfather   . Cancer Maternal Grandfather        Bladder, Prostate/bladder/prostate  . Heart disease Paternal Grandfather   . Heart attack Paternal Grandfather   . Macular degeneration Unknown   . Diabetes Brother   . Breast cancer Maternal Aunt      Social History   Tobacco Use  . Smoking status: Current Some Day Smoker    Types: Cigarettes    Last attempt to quit: 06/12/2015    Years since quitting: 2.5  . Smokeless tobacco: Never Used  . Tobacco comment: 1-2 cigarettes some days  Substance Use Topics  . Alcohol use: No    Alcohol/week: 0.0 oz  . Drug use: No    Allergies as of 12/21/2017 - Review Complete 12/06/2017  Allergen Reaction Noted  . Rocephin [ceftriaxone] Anaphylaxis 05/08/2014  . Statins Anaphylaxis, Diarrhea, and Nausea And Vomiting 12/03/2015  . Sulfa antibiotics Diarrhea, Nausea And Vomiting, Rash, and Anaphylaxis 09/28/2001  . Valproate sodium  02/22/2009  . Compazine [prochlorperazine] Other (See Comments) 03/31/2015  . Erythromycin Itching, Nausea And Vomiting, and Rash 03/31/2015  . Lidocaine Swelling 09/28/2001  . Meloxicam  12/03/2015  . Nitrofuran derivatives  09/28/2001  . Ace inhibitors Itching 03/31/2015  . Biaxin [clarithromycin] Itching, Nausea And Vomiting, and Rash 05/08/2014  . Doxycycline Nausea And Vomiting 08/13/2015  . Latex Rash 03/31/2015  . Morphine and related Nausea And Vomiting 09/28/2001  . Sulfamethoxazole-trimethoprim Diarrhea and Nausea And Vomiting 05/04/2015    Review of Systems:    All systems reviewed and negative except where noted in HPI.   Physical Exam:  LMP 07/01/2013 Comment: tubal Patient's last menstrual period was 07/01/2013. Psych:  Alert and cooperative. Normal mood and affect. General:   Alert,  Well-developed, well-nourished, pleasant and cooperative in NAD Head:   Normocephalic and atraumatic. Eyes:  Sclera clear, no icterus.   Conjunctiva pink. Ears:  Normal auditory acuity. Nose:  No deformity, discharge, or lesions. Mouth:  No deformity or lesions,oropharynx pink & moist. Neck:  Supple; no masses or thyromegaly. Lungs:  Respirations even and unlabored.  Clear throughout to auscultation.   No wheezes, crackles, or rhonchi. No acute distress. Heart:  Regular rate and rhythm; no murmurs, clicks, rubs, or gallops. Abdomen:  Normal bowel sounds.  No bruits.  Soft, non-tender and non-distended without masses, hepatosplenomegaly or hernias noted.  No guarding or rebound tenderness.    Msk:  Symmetrical without gross deformities. Good, equal movement & strength bilaterally. Pulses:  Normal pulses noted. Extremities:  No clubbing or edema.  No cyanosis. Neurologic:  Alert and oriented x3;  grossly normal neurologically. Skin:  Intact without significant lesions or rashes. No jaundice. Lymph Nodes:  No significant cervical adenopathy. Psych:  Alert and cooperative. Normal mood and affect.  Imaging Studies: Dg Chest 2 View  Result Date: 12/06/2017 CLINICAL DATA:  Chest pain EXAM: CHEST - 2 VIEW COMPARISON:  02/15/2017 chest radiograph. FINDINGS: Stable cardiomediastinal silhouette with normal heart size. No pneumothorax. No pleural effusion. Lungs appear clear, with no acute consolidative airspace  disease and no pulmonary edema. IMPRESSION: No active cardiopulmonary disease. Electronically Signed   By: Ilona Sorrel M.D.   On: 12/06/2017 16:21    Assessment and Plan:   PAULEEN GOLEMAN is a 52 y.o. y/o female has been referred referred to see me for abdominal pain.  Recent to the emergency room for the same.  Treated conservatively with Carafate and discharged.  Screening colonoscopy in October 2018 could not be performed due to solid stool in the left side of the colon.  She did not follow-up subsequently.  She has a prior history of colon polyps.Her abdominal  appears to be likely from a combination of IBS-C +/- acid reflux/ulcers/bechets    Plan 1.  EGD+Colonoscopy to screen for colon cancer with a prior history of colon polyps.Eloquis holding instructions 2.  High-fiber diet patient information provided.  3.  Remains on Linzess 290  mcg 2 weeks and was provided.  She should follow-up in a week's time if it does not work and the dose will be increased subsequently. 4. Stool H pylori antigen  5. Start taking PPI in the morning and not on night    I have discussed alternative options, risks & benefits,  which include, but are not limited to, bleeding, infection, perforation,respiratory complication & drug reaction.  The patient agrees with this plan & written consent will be obtained.       Follow up in 8-12 weeks   Dr [name] MD,MRCP(U.K)

## 2017-12-21 NOTE — Patient Instructions (Signed)
High-Fiber Diet  Fiber, also called dietary fiber, is a type of carbohydrate found in fruits, vegetables, whole grains, and beans. A high-fiber diet can have many health benefits. Your health care provider may recommend a high-fiber diet to help:  · Prevent constipation. Fiber can make your bowel movements more regular.  · Lower your cholesterol.  · Relieve hemorrhoids, uncomplicated diverticulosis, or irritable bowel syndrome.  · Prevent overeating as part of a weight-loss plan.  · Prevent heart disease, type 2 diabetes, and certain cancers.    What is my plan?  The recommended daily intake of fiber includes:  · 38 grams for men under age 50.  · 30 grams for men over age 50.  · 25 grams for women under age 50.  · 21 grams for women over age 50.    You can get the recommended daily intake of dietary fiber by eating a variety of fruits, vegetables, grains, and beans. Your health care provider may also recommend a fiber supplement if it is not possible to get enough fiber through your diet.  What do I need to know about a high-fiber diet?  · Fiber supplements have not been widely studied for their effectiveness, so it is better to get fiber through food sources.  · Always check the fiber content on the nutrition facts label of any prepackaged food. Look for foods that contain at least 5 grams of fiber per serving.  · Ask your dietitian if you have questions about specific foods that are related to your condition, especially if those foods are not listed in the following section.  · Increase your daily fiber consumption gradually. Increasing your intake of dietary fiber too quickly may cause bloating, cramping, or gas.  · Drink plenty of water. Water helps you to digest fiber.  What foods can I eat?  Grains  Whole-grain breads. Multigrain cereal. Oats and oatmeal. Brown rice. Barley. Bulgur wheat. Millet. Bran muffins. Popcorn. Rye wafer crackers.  Vegetables   Sweet potatoes. Spinach. Kale. Artichokes. Cabbage. Broccoli. Green peas. Carrots. Squash.  Fruits  Berries. Pears. Apples. Oranges. Avocados. Prunes and raisins. Dried figs.  Meats and Other Protein Sources  Navy, kidney, pinto, and soy beans. Split peas. Lentils. Nuts and seeds.  Dairy  Fiber-fortified yogurt.  Beverages  Fiber-fortified soy milk. Fiber-fortified orange juice.  Other  Fiber bars.  The items listed above may not be a complete list of recommended foods or beverages. Contact your dietitian for more options.  What foods are not recommended?  Grains  White bread. Pasta made with refined flour. White rice.  Vegetables  Fried potatoes. Canned vegetables. Well-cooked vegetables.  Fruits  Fruit juice. Cooked, strained fruit.  Meats and Other Protein Sources  Fatty cuts of meat. Fried poultry or fried fish.  Dairy  Milk. Yogurt. Cream cheese. Sour cream.  Beverages  Soft drinks.  Other  Cakes and pastries. Butter and oils.  The items listed above may not be a complete list of foods and beverages to avoid. Contact your dietitian for more information.  What are some tips for including high-fiber foods in my diet?  · Eat a wide variety of high-fiber foods.  · Make sure that half of all grains consumed each day are whole grains.  · Replace breads and cereals made from refined flour or white flour with whole-grain breads and cereals.  · Replace white rice with brown rice, bulgur wheat, or millet.  · Start the day with a breakfast that is high in fiber,   such as a cereal that contains at least 5 grams of fiber per serving.  · Use beans in place of meat in soups, salads, or pasta.  · Eat high-fiber snacks, such as berries, raw vegetables, nuts, or popcorn.  This information is not intended to replace advice given to you by your health care provider. Make sure you discuss any questions you have with your health care provider.  Document Released: 05/16/2005 Document Revised: 10/22/2015 Document Reviewed: 10/29/2013   Elsevier Interactive Patient Education © 2018 [employer]

## 2017-12-22 ENCOUNTER — Other Ambulatory Visit (HOSPITAL_COMMUNITY)
Admission: RE | Admit: 2017-12-22 | Discharge: 2017-12-22 | Disposition: A | Discharge status: Home / Self Care | Payer: BC Managed Care – PPO | Source: Ambulatory Visit | Attending: Family Medicine | Admitting: Family Medicine

## 2017-12-22 ENCOUNTER — Encounter: Payer: Self-pay | Admitting: Family Medicine

## 2017-12-22 ENCOUNTER — Other Ambulatory Visit: Payer: Self-pay

## 2017-12-22 ENCOUNTER — Ambulatory Visit (INDEPENDENT_AMBULATORY_CARE_PROVIDER_SITE_OTHER): Payer: BC Managed Care – PPO | Admitting: Family Medicine

## 2017-12-22 VITALS — BP 125/83 | HR 83 | Temp 98.0°F | Ht 64.0 in | Wt 241.0 lb

## 2017-12-22 DIAGNOSIS — R1013 Epigastric pain: Secondary | ICD-10-CM

## 2017-12-22 DIAGNOSIS — R3989 Other symptoms and signs involving the genitourinary system: Secondary | ICD-10-CM

## 2017-12-22 DIAGNOSIS — Z124 Encounter for screening for malignant neoplasm of cervix: Secondary | ICD-10-CM | POA: Diagnosis present

## 2017-12-22 DIAGNOSIS — Z1231 Encounter for screening mammogram for malignant neoplasm of breast: Secondary | ICD-10-CM

## 2017-12-22 DIAGNOSIS — Z0000 Encounter for general adult medical examination without abnormal findings: Secondary | ICD-10-CM | POA: Diagnosis not present

## 2017-12-22 DIAGNOSIS — Z1239 Encounter for other screening for malignant neoplasm of breast: Secondary | ICD-10-CM

## 2017-12-22 DIAGNOSIS — G8929 Other chronic pain: Secondary | ICD-10-CM

## 2017-12-22 DIAGNOSIS — K581 Irritable bowel syndrome with constipation: Secondary | ICD-10-CM

## 2017-12-22 MED ORDER — ENOXAPARIN SODIUM 40 MG/0.4ML ~~LOC~~ SOLN: 40.0000 mg | SUBCUTANEOUS | 0 refills | Status: DC

## 2017-12-22 MED ORDER — OMEPRAZOLE 20 MG PO CPDR: 20.0000 mg | DELAYED_RELEASE_CAPSULE | Freq: Every day | ORAL | 3 refills | Status: DC

## 2017-12-22 NOTE — Patient Instructions (Signed)
[facility]  Address: [address], [place], [place] [address]  Phone: [phone]  

## 2017-12-22 NOTE — Assessment & Plan Note (Addendum)
Titrating up on her saxenda. Tolerating well. Has lost 14lbs already!

## 2017-12-22 NOTE — Progress Notes (Signed)
BP 125/83 (BP Location: Right Arm, Patient Position: Sitting, Cuff Size: Large)   Pulse 83   Temp 98 F (36.7 C)   Ht 5\' 4"  (1.626 m)   Wt 241 lb (109.3 kg)   LMP 07/01/2013 Comment: tubal  SpO2 100%   BMI 41.37 kg/m    Subjective:    Patient ID: Shirley Rogers, female    DOB: [birth year], 52 y.o.   MRN: [id number]  HPI: Shirley Rogers is a 52 y.o. female presenting on 12/22/2017 for comprehensive medical examination. Current medical complaints include:  WEIGHT GAIN- has been started on saxenda since last visit Duration: chronic Previous attempts at weight loss: yes, weight watchers, jenny craig Complications of obesity:  Peak weight: 255lbs Weight loss goal: to be healthy Weight loss to date: 14lbs Requesting obesity pharmacotherapy: yes Current weight loss supplements/medications: yes Previous weight loss supplements/meds: no  She currently lives with: son and room mate Menopausal Symptoms: no  Depression Screen done today and results listed below:  Depression screen [facility] 2/9 11/16/2017 04/04/2017 12/13/2016 10/06/2016 05/31/2016  Decreased Interest 2 0 0 3 1  Down, Depressed, Hopeless 1 0 0 2 0  PHQ - 2 Score 3 0 0 5 1  Altered sleeping 0 - 1 2 0  Tired, decreased energy 3 - 0 3 1  Change in appetite 2 - 0 2 1  Feeling bad or failure about yourself  0 - 0 1 0  Trouble concentrating 0 - 0 2 0  Moving slowly or fidgety/restless 1 - 0 1 0  Suicidal thoughts 0 - 0 0 0  PHQ-9 Score 9 - 1 16 3   Difficult doing work/chores Somewhat difficult - - - -    Past Medical History:  Past Medical History:  Diagnosis Date  . Anxiety    on xanax in the past  . ASCVD (arteriosclerotic cardiovascular disease)    carotids  . Barrett esophagus   . 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 Carotid atherosclerosis 2003   on ultrasound  . Colon polyp   . Complicated migraine    with facial drooping  . Concussion    1974,1982,1987,2016,2016  . DDD (degenerative disc disease), lumbar    MRI 2008,  L3-4, L5-S1, spondylotic changes, multilevel facet joint hypertrophic changes  . Depression    on paxil, lexapro, cymbalta in the past  . Diverticulosis   . Dizziness due to old head injury   . Family history of adverse reaction to anesthesia    father - slow to wake  . Fatty liver 2013   On CT abdomen/pelvis  . Fibrocystic breast disease   . Fibromyalgia   . Fracture    left knee medial femoral condyle stress fracture, chondromalacia  . GERD (gastroesophageal reflux disease)   . Hemiplegic migraine    daily  . Hemorrhoids   . History of kidney stones   . Hyperlipidemia   . Hypertension    no meds currently.  . IBS (irritable bowel syndrome)   . Insomnia chronic  . Kidney stone 2013   on CT abdomen/pelvis  . 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 Migraine   . Narrowing of intervertebral disc space 2011   C5-6  . Normal cardiac stress test 2009   EF 66%  . Pneumonia   . Pulmonary emboli ([place])   . Recurrent sinusitis   . Recurrent UTI    question about IC in  the past  . Sleep apnea    CPAP machine broken  . Stroke ([place])    2000 - no deficits  . Umbilical hernia 1610   on CT abdomen/pelvis  . Varicose veins    right lower leg  . Vascular spasm ([place])   . Vertigo    from concussive disorder.  seeing neuro 03/23/15    Surgical History:  Past Surgical History:  Procedure Laterality Date  . Biopsy Punch Thyroid    . BREAST EXCISIONAL BIOPSY Right 2000   right mass excision - atypical hyperplasia  . BREAST SURGERY Right    atypical hyperplasia  . CHOLECYSTECTOMY N/A 07/24/2015   Procedure: LAPAROSCOPIC CHOLECYSTECTOMY WITH INTRAOPERATIVE CHOLANGIOGRAM;  Surgeon: Leonie Green, MD;  Location: [facility] ORS;  Service: General;  Laterality: N/A;  . COLONOSCOPY W/ BIOPSIES     Removed 5 polyps  . COLONOSCOPY WITH PROPOFOL N/A 03/03/2017   Procedure: COLONOSCOPY WITH PROPOFOL;  Surgeon: [name],  MD;  Location: [facility] ENDOSCOPY;  Service: Gastroenterology;  Laterality: N/A;  . KNEE ARTHROSCOPY WITH SUBCHONDROPLASTY Left 05/26/2017   Procedure: LEFT KNEE ARTHROSCOPY WITH SUBCHONDROPLASTY, CHONDROPLASTY, SYNOVECTOMY, POSSIBLE MICROFX;  Surgeon: Leandrew Koyanagi, MD;  Location: [place];  Service: Orthopedics;  Laterality: Left;  . TUBAL LIGATION      Medications:  Current Outpatient Medications on File Prior to Visit  Medication Sig  . b complex vitamins tablet Take 1 tablet by mouth daily.  Marland Kitchen buPROPion (WELLBUTRIN SR) 150 MG 12 hr tablet TAKE 1 TABLET BY MOUTH EVERY MORNING FOR 3 DAYS, THEN 1 TABLET TWICE DAILY THEREAFTER  . busPIRone (BUSPAR) 15 MG tablet Take 1 tablet (15 mg total) by mouth 3 (three) times daily as needed. (Patient not taking: Reported on 12/21/2017)  . calcium-vitamin D (OSCAL WITH D) 500-200 MG-UNIT tablet Take 1 tablet by mouth 3 (three) times daily.  . diphenhydrAMINE (BENADRYL) 25 mg capsule Take 2 capsules (50 mg total) by mouth every 6 (six) hours as needed. (Patient taking differently: Take 50 mg by mouth every 6 (six) hours as needed (for itching/allergies.). )  . DULoxetine (CYMBALTA) 60 MG capsule Take 1 capsule (60 mg total) by mouth daily.  Regino Schultze Bandages & Supports (T.E.D. THIGH LENGTH/L-REGULAR) MISC 2 application by Does not apply route every morning.  Marland Kitchen ELIQUIS 5 MG TABS tablet TAKE 1 TABLET(5 MG) BY MOUTH TWICE DAILY  . EPINEPHRINE 0.3 mg/0.3 mL IJ SOAJ injection INJECT 0.3 ML( 0.3 MG) INTO THE MUSCLE ONCE  . furosemide (LASIX) 20 MG tablet TAKE 1 TABLET(20 MG) BY MOUTH DAILY  . Insulin Pen Needle 31G X 6 MM MISC 1 each by Does not apply route daily.  . Liraglutide -Weight Management (SAXENDA) 18 MG/3ML SOPN Inject 0.6 mg into the skin daily for 7 days, THEN 1.2 mg daily for 7 days, THEN 1.8 mg daily for 7 days, THEN 2.4 mg daily for 7 days, THEN 3 mg daily.  . Magnesium 250 MG TABS Take 1,000 mg by mouth daily.  . methazolamide (NEPTAZANE) 25 MG tablet  Take 1 tablet (25 mg total) by mouth 3 (three) times daily.  . pregabalin (LYRICA) 75 MG capsule Take 75 mg by mouth at bedtime.  Marland Kitchen PROAIR HFA 108 (90 Base) MCG/ACT inhaler INHALE 2 PUFFS BY MOUTH EVERY 4 HOURS AS NEEDED FOR WHEEZING  . traMADol (ULTRAM) 50 MG tablet Take 1 tablet (50 mg total) by mouth 2 (two) times daily as needed.  . traZODone (DESYREL) 100 MG tablet TAKE 1 TABLET(100 MG)  BY MOUTH AT BEDTIME AS NEEDED FOR SLEEP  . verapamil (CALAN-SR) 240 MG CR tablet TAKE 1 TABLET(240 MG) BY MOUTH DAILY   Current Facility-Administered Medications on File Prior to Visit  Medication  . gadopentetate dimeglumine (MAGNEVIST) injection 20 mL    Allergies:  Allergies  Allergen Reactions  . Rocephin [Ceftriaxone] Anaphylaxis    Rocephin - throat swelling, rash  . Statins Anaphylaxis, Diarrhea and Nausea And Vomiting  . Sulfa Antibiotics Diarrhea, Nausea And Vomiting, Rash and Anaphylaxis    Bactrim - severe itching   . 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 Compazine [Prochlorperazine] Other (See Comments)    AKATHESIA  . Erythromycin Itching, Nausea And Vomiting and Rash    Patient states she is allergic to all mycin drugs  . Lidocaine Swelling    SWELLING REACTION UNSPECIFIED   . Meloxicam     UNSPECIFIED REACTION   . Nitrofuran Derivatives     biaxin - severe itching  . Ace Inhibitors Itching  . Biaxin [Clarithromycin] Itching, Nausea And Vomiting and Rash  . Doxycycline Nausea And Vomiting  . Latex Rash  . Morphine And Related Nausea And Vomiting    Roxanol - severe itching  . Sulfamethoxazole-Trimethoprim Diarrhea and Nausea And Vomiting    SULFONAMIDE ANTIBIOTICS     Social History:  Social History   Socioeconomic History  .  Marital status: Divorced    Spouse name: Not on file  . Number of children: 1  . Years of education: 50  . 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 Financial resource strain: Not on file  . Food insecurity:    Worry: Not on file    Inability: Not on file  . Transportation needs:    Medical: Not on file    Non-medical: Not on file  Tobacco Use  . Smoking status: Current Some Day Smoker    Types: Cigarettes    Last attempt to quit: 06/12/2015    Years since quitting: 2.5  . Smokeless tobacco: Never Used  . Tobacco comment: 1-2 cigarettes some days  Substance and Sexual Activity  . Alcohol use: No    Alcohol/week: 0.0 oz  . Drug use: No  . Sexual activity: Never    Comment: tubal ligation and menopause  Lifestyle  . Physical activity:    Days per week: Not on file    Minutes per session: Not on file  . Stress: Not on file  Relationships  . Social connections:    Talks on phone: Not on file    Gets together: Not on file    Attends religious service: Not on file    Active member of club or organization: Not on file    Attends meetings of clubs or organizations: Not on file    Relationship status: Not on file  . Intimate partner violence:    Fear of current or ex partner: Not on file    Emotionally abused: Not on file    Physically abused: Not on file    Forced sexual activity: Not on file  Other Topics Concern  . Not on file  Social History Narrative   Lives at home with friend  Caffeine use- coffee 1 cup daily   Social History   Tobacco Use  Smoking Status Current Some Day Smoker  . Types: Cigarettes  . Last attempt to quit: 06/12/2015  . Years since quitting: 2.5  Smokeless Tobacco Never Used  Tobacco Comment   1-2 cigarettes some days   Social History   Substance and Sexual Activity  Alcohol Use No  . Alcohol/week: 0.0 oz    Family History:  Family History  Problem Relation  Age of Onset  . Other Mother        IBS  . Hypertension Mother   . Heart disease Father   . Diverticulosis Father   . Migraines Father   . Other Son        Ulcerative Colitis, Polonidal cyst  . Parkinson's disease Maternal Grandmother   . Cancer Maternal Grandmother   . Mental illness Paternal Grandmother   . Stroke Paternal Grandmother   . Cancer Paternal Grandmother        brain cancer  . Bipolar disorder Paternal Grandmother   . Stroke Maternal Grandfather   . Cancer Maternal Grandfather        Bladder, Prostate/bladder/prostate  . Heart disease Paternal Grandfather   . Heart attack Paternal Grandfather   . Macular degeneration Unknown   . Diabetes Brother   . Breast cancer Maternal Aunt     Past medical history, surgical history, medications, allergies, family history and social history reviewed with patient today and changes made to appropriate areas of the chart.   .Review of Systems  Constitutional: Negative.   HENT: Positive for tinnitus. Negative for congestion, ear discharge, ear pain, hearing loss, nosebleeds, sinus pain and sore throat.   Eyes: Negative.   Respiratory: Positive for shortness of breath. Negative for cough, hemoptysis, sputum production, wheezing and stridor.   Cardiovascular: Positive for chest pain. Negative for palpitations, orthopnea, claudication, leg swelling and PND.  Gastrointestinal: Positive for abdominal pain, constipation, heartburn, nausea and vomiting. Negative for blood in stool, diarrhea and melena.  Genitourinary: Negative.   Musculoskeletal: Negative.   Skin: Negative.   Neurological: Positive for dizziness, tingling, weakness and headaches. Negative for tremors, sensory change, speech change, focal weakness, seizures and loss of consciousness.  Endo/Heme/Allergies: Positive for polydipsia. Negative for environmental allergies. Bruises/bleeds easily.  Psychiatric/Behavioral: Negative.     All other ROS negative except what is  listed above and in the HPI.      Objective:    BP 125/83 (BP Location: Right Arm, Patient Position: Sitting, Cuff Size: Large)   Pulse 83   Temp 98 F (36.7 C)   Ht 5\' 4"  (1.626 m)   Wt 241 lb (109.3 kg)   LMP 07/01/2013 Comment: tubal  SpO2 100%   BMI 41.37 kg/m   Wt Readings from Last 3 Encounters:  12/22/17 241 lb (109.3 kg)  12/21/17 241 lb 12.8 oz (109.7 kg)  12/06/17 245 lb (111.1 kg)    Physical Exam  Constitutional: She is oriented to person, place, and time. She appears well-developed and well-nourished. No distress.  HENT:  Head: Normocephalic and atraumatic.  Right Ear: Hearing, tympanic membrane, external ear and ear canal normal.  Left Ear: Hearing, tympanic membrane, external ear and ear canal normal.  Nose: Nose normal.  Mouth/Throat: Uvula is midline, oropharynx is clear and moist and mucous membranes are normal. No oropharyngeal exudate.  Eyes: Pupils are equal, round, and reactive to light. Conjunctivae, EOM and lids are normal. Right eye exhibits no discharge. Left eye exhibits  no discharge. No scleral icterus.  Neck: Normal range of motion. Neck supple. No JVD present. No tracheal deviation present. No thyromegaly present.  Cardiovascular: Normal rate, regular rhythm, normal heart sounds and intact distal pulses. Exam reveals no gallop and no friction rub.  No murmur heard. Pulmonary/Chest: Effort normal and breath sounds normal. No stridor. No respiratory distress. She has no wheezes. She has no rales. She exhibits no tenderness. Right breast exhibits no inverted nipple, no mass, no nipple discharge, no skin change and no tenderness. Left breast exhibits no inverted nipple, no mass, no nipple discharge, no skin change and no tenderness. No breast swelling, tenderness, discharge or bleeding. Breasts are symmetrical.  Abdominal: Soft. Bowel sounds are normal. She exhibits no distension and no mass. There is no tenderness. There is no rebound and no guarding. No  hernia. Hernia confirmed negative in the right inguinal area and confirmed negative in the left inguinal area.  Genitourinary: Vagina normal and uterus normal. No labial fusion. There is no rash, tenderness, lesion or injury on the right labia. There is no rash, tenderness, lesion or injury on the left labia. No vaginal discharge found.  Musculoskeletal: Normal range of motion. She exhibits no edema, tenderness or deformity.  Lymphadenopathy:    She has no cervical adenopathy. No inguinal adenopathy noted on the right or left side.  Neurological: She is alert and oriented to person, place, and time. She displays normal reflexes. No cranial nerve deficit or sensory deficit. She exhibits normal muscle tone. Coordination normal.  Skin: Skin is warm, dry and intact. Capillary refill takes less than 2 seconds. No rash noted. She is not diaphoretic. No erythema. No pallor.  Psychiatric: She has a normal mood and affect. Her speech is normal and behavior is normal. Judgment and thought content normal. Cognition and memory are normal.  Nursing note and vitals reviewed.   Results for orders placed or performed during the hospital encounter of 93/81/01  Basic metabolic panel  Result Value Ref Range   Sodium 142 135 - 145 mmol/L   Potassium 3.5 3.5 - 5.1 mmol/L   Chloride 103 98 - 111 mmol/L   CO2 29 22 - 32 mmol/L   Glucose, Bld 100 (H) 70 - 99 mg/dL   BUN 15 6 - 20 mg/dL   Creatinine, Ser 1.10 (H) 0.44 - 1.00 mg/dL   Calcium 10.3 8.9 - 10.3 mg/dL   GFR calc non Af Amer 56 (L) >60 mL/min   GFR calc Af Amer >60 >60 mL/min   Anion gap 10 5 - 15  CBC  Result Value Ref Range   WBC 6.5 3.6 - 11.0 K/uL   RBC 4.80 3.80 - 5.20 MIL/uL   Hemoglobin 14.8 12.0 - 16.0 g/dL   HCT 43.2 35.0 - 47.0 %   MCV 90.0 80.0 - 100.0 fL   MCH 30.8 26.0 - 34.0 pg   MCHC 34.3 32.0 - 36.0 g/dL   RDW 12.8 11.5 - 14.5 %   Platelets 240 150 - 440 K/uL  Troponin I  Result Value Ref Range   Troponin I <0.03 <0.03 ng/mL    Hepatic function panel  Result Value Ref Range   Total Protein 7.4 6.5 - 8.1 g/dL   Albumin 4.2 3.5 - 5.0 g/dL   AST 21 15 - 41 U/L   ALT 12 0 - 44 U/L   Alkaline Phosphatase 53 38 - 126 U/L   Total Bilirubin 0.5 0.3 - 1.2 mg/dL   Bilirubin, Direct 0.1  0.0 - 0.2 mg/dL   Indirect Bilirubin 0.4 0.3 - 0.9 mg/dL  Lipase, blood  Result Value Ref Range   Lipase 27 11 - 51 U/L      Assessment & Plan:   Problem List Items Addressed This Visit      Other   Morbid obesity ([place])    Titrating up on her saxenda. Tolerating well. Has lost 14lbs already!       Other Visit Diagnoses    Routine general medical examination at a health care facility    -  Primary   Vaccines up to date. Screening labs checked last visit. Pap done today. Mammogram ordered. Colonoscopy up to date. Call with any concerns.   Screening for cervical cancer       Pap done today.   Relevant Orders   Cytology - PAP   Screening for breast cancer       Mammogram ordered today.   Dark yellow-colored urine       Urine clear.    Relevant Orders   UA/M w/rflx Culture, Routine   Urine Culture       Follow up plan: Return in about 6 months (around 06/24/2018).   LABORATORY TESTING:  - Pap smear: pap done  IMMUNIZATIONS:   - Tdap: Tetanus vaccination status reviewed: last tetanus booster within 10 years. - Influenza: Postponed to flu season - Pneumovax: Up to date - Prevnar: Up to date - HPV: Not applicable - Zostavax vaccine: Not applicable  SCREENING: -Mammogram: Ordered today  - Colonoscopy: Up to date   PATIENT COUNSELING:   Advised to take 1 mg of folate supplement per day if capable of pregnancy.   Sexuality: Discussed sexually transmitted diseases, partner selection, use of condoms, avoidance of unintended pregnancy  and contraceptive alternatives.   Advised to avoid cigarette smoking.  I discussed with the patient that most people either abstain from alcohol or drink within safe limits  (<=14/week and <=4 drinks/occasion for males, <=7/weeks and <= 3 drinks/occasion for females) and that the risk for alcohol disorders and other health effects rises proportionally with the number of drinks per week and how often a drinker exceeds daily limits.  Discussed cessation/primary prevention of drug use and availability of treatment for abuse.   Diet: Encouraged to adjust caloric intake to maintain  or achieve ideal body weight, to reduce intake of dietary saturated fat and total fat, to limit sodium intake by avoiding high sodium foods and not adding table salt, and to maintain adequate dietary potassium and calcium preferably from fresh fruits, vegetables, and low-fat dairy products.    stressed the importance of regular exercise  Injury prevention: Discussed safety belts, safety helmets, smoke detector, smoking near bedding or upholstery.   Dental health: Discussed importance of regular tooth brushing, flossing, and dental visits.    NEXT PREVENTATIVE PHYSICAL DUE IN 1 YEAR. Return in about 6 months (around 06/24/2018).

## 2017-12-23 LAB — UA/M W/RFLX CULTURE, ROUTINE
Bilirubin, UA: NEGATIVE
GLUCOSE, UA: NEGATIVE
Ketones, UA: NEGATIVE
Leukocytes, UA: NEGATIVE
Nitrite, UA: NEGATIVE
RBC, UA: NEGATIVE
Specific Gravity, UA: 1.025 (ref 1.005–1.030)
Urobilinogen, Ur: 0.2 mg/dL (ref 0.2–1.0)
pH, UA: 5.5 (ref 5.0–7.5)

## 2017-12-27 ENCOUNTER — Encounter: Payer: Self-pay | Admitting: Family Medicine

## 2017-12-27 LAB — CYTOLOGY - PAP: HPV: NOT DETECTED

## 2018-01-08 ENCOUNTER — Ambulatory Visit: Payer: BC Managed Care – PPO | Admitting: Registered Nurse

## 2018-01-08 ENCOUNTER — Encounter
Admission: RE | Disposition: A | Discharge status: Home / Self Care | Payer: Self-pay | Source: Ambulatory Visit | Attending: Gastroenterology

## 2018-01-08 ENCOUNTER — Ambulatory Visit
Admission: RE | Admit: 2018-01-08 | Discharge: 2018-01-08 | Disposition: A | Discharge status: Home / Self Care | Payer: BC Managed Care – PPO | Source: Ambulatory Visit | Attending: Gastroenterology | Admitting: Gastroenterology

## 2018-01-08 ENCOUNTER — Encounter: Payer: Self-pay | Admitting: *Deleted

## 2018-01-08 DIAGNOSIS — K229 Disease of esophagus, unspecified: Secondary | ICD-10-CM

## 2018-01-08 DIAGNOSIS — I10 Essential (primary) hypertension: Secondary | ICD-10-CM | POA: Insufficient documentation

## 2018-01-08 DIAGNOSIS — D122 Benign neoplasm of ascending colon: Secondary | ICD-10-CM | POA: Insufficient documentation

## 2018-01-08 DIAGNOSIS — K219 Gastro-esophageal reflux disease without esophagitis: Secondary | ICD-10-CM | POA: Insufficient documentation

## 2018-01-08 DIAGNOSIS — G43409 Hemiplegic migraine, not intractable, without status migrainosus: Secondary | ICD-10-CM | POA: Diagnosis not present

## 2018-01-08 DIAGNOSIS — R109 Unspecified abdominal pain: Secondary | ICD-10-CM | POA: Diagnosis present

## 2018-01-08 DIAGNOSIS — G8929 Other chronic pain: Secondary | ICD-10-CM | POA: Diagnosis not present

## 2018-01-08 DIAGNOSIS — M329 Systemic lupus erythematosus, unspecified: Secondary | ICD-10-CM | POA: Insufficient documentation

## 2018-01-08 DIAGNOSIS — K621 Rectal polyp: Secondary | ICD-10-CM

## 2018-01-08 DIAGNOSIS — Z8673 Personal history of transient ischemic attack (TIA), and cerebral infarction without residual deficits: Secondary | ICD-10-CM | POA: Insufficient documentation

## 2018-01-08 DIAGNOSIS — R1013 Epigastric pain: Secondary | ICD-10-CM

## 2018-01-08 DIAGNOSIS — K760 Fatty (change of) liver, not elsewhere classified: Secondary | ICD-10-CM | POA: Diagnosis not present

## 2018-01-08 DIAGNOSIS — Z885 Allergy status to narcotic agent status: Secondary | ICD-10-CM | POA: Insufficient documentation

## 2018-01-08 DIAGNOSIS — G4730 Sleep apnea, unspecified: Secondary | ICD-10-CM | POA: Diagnosis not present

## 2018-01-08 DIAGNOSIS — K319 Disease of stomach and duodenum, unspecified: Secondary | ICD-10-CM | POA: Insufficient documentation

## 2018-01-08 DIAGNOSIS — Z882 Allergy status to sulfonamides status: Secondary | ICD-10-CM | POA: Insufficient documentation

## 2018-01-08 DIAGNOSIS — K449 Diaphragmatic hernia without obstruction or gangrene: Secondary | ICD-10-CM | POA: Diagnosis not present

## 2018-01-08 DIAGNOSIS — F17210 Nicotine dependence, cigarettes, uncomplicated: Secondary | ICD-10-CM | POA: Diagnosis not present

## 2018-01-08 DIAGNOSIS — Z86711 Personal history of pulmonary embolism: Secondary | ICD-10-CM | POA: Diagnosis not present

## 2018-01-08 DIAGNOSIS — Z7901 Long term (current) use of anticoagulants: Secondary | ICD-10-CM | POA: Diagnosis not present

## 2018-01-08 DIAGNOSIS — Z91040 Latex allergy status: Secondary | ICD-10-CM | POA: Insufficient documentation

## 2018-01-08 DIAGNOSIS — E785 Hyperlipidemia, unspecified: Secondary | ICD-10-CM | POA: Diagnosis not present

## 2018-01-08 DIAGNOSIS — K589 Irritable bowel syndrome without diarrhea: Secondary | ICD-10-CM | POA: Insufficient documentation

## 2018-01-08 DIAGNOSIS — F329 Major depressive disorder, single episode, unspecified: Secondary | ICD-10-CM | POA: Insufficient documentation

## 2018-01-08 DIAGNOSIS — M5136 Other intervertebral disc degeneration, lumbar region: Secondary | ICD-10-CM | POA: Insufficient documentation

## 2018-01-08 DIAGNOSIS — D128 Benign neoplasm of rectum: Secondary | ICD-10-CM | POA: Diagnosis not present

## 2018-01-08 DIAGNOSIS — K2270 Barrett's esophagus without dysplasia: Secondary | ICD-10-CM | POA: Insufficient documentation

## 2018-01-08 DIAGNOSIS — M797 Fibromyalgia: Secondary | ICD-10-CM | POA: Insufficient documentation

## 2018-01-08 DIAGNOSIS — G4700 Insomnia, unspecified: Secondary | ICD-10-CM | POA: Diagnosis not present

## 2018-01-08 DIAGNOSIS — I2510 Atherosclerotic heart disease of native coronary artery without angina pectoris: Secondary | ICD-10-CM | POA: Diagnosis not present

## 2018-01-08 DIAGNOSIS — K581 Irritable bowel syndrome with constipation: Secondary | ICD-10-CM

## 2018-01-08 HISTORY — PX: ESOPHAGOGASTRODUODENOSCOPY (EGD) WITH PROPOFOL: SHX5813

## 2018-01-08 HISTORY — PX: COLONOSCOPY WITH PROPOFOL: SHX5780

## 2018-01-08 SURGERY — COLONOSCOPY WITH PROPOFOL
Anesthesia: General

## 2018-01-08 MED ORDER — IPRATROPIUM-ALBUTEROL 0.5-2.5 (3) MG/3ML IN SOLN
RESPIRATORY_TRACT | Status: AC
  Administered 2018-01-08: 3 mL
  Filled 2018-01-08: qty 3

## 2018-01-08 MED ORDER — SODIUM CHLORIDE 0.9 % IJ SOLN
INTRAMUSCULAR | Status: AC
  Filled 2018-01-08: qty 40

## 2018-01-08 MED ORDER — FENTANYL CITRATE (PF) 100 MCG/2ML IJ SOLN
INTRAMUSCULAR | Status: DC | PRN
  Administered 2018-01-08 (×4): 25 ug via INTRAVENOUS

## 2018-01-08 MED ORDER — EPHEDRINE SULFATE 50 MG/ML IJ SOLN
INTRAMUSCULAR | Status: AC
  Filled 2018-01-08: qty 1

## 2018-01-08 MED ORDER — FENTANYL CITRATE (PF) 100 MCG/2ML IJ SOLN
INTRAMUSCULAR | Status: AC
  Filled 2018-01-08: qty 2

## 2018-01-08 MED ORDER — GLYCOPYRROLATE 0.2 MG/ML IJ SOLN
INTRAMUSCULAR | Status: DC | PRN
  Administered 2018-01-08: 0.2 mg via INTRAVENOUS

## 2018-01-08 MED ORDER — PROPOFOL 500 MG/50ML IV EMUL
INTRAVENOUS | Status: DC | PRN
  Administered 2018-01-08: 140 ug/kg/min via INTRAVENOUS

## 2018-01-08 MED ORDER — PROPOFOL 500 MG/50ML IV EMUL
INTRAVENOUS | Status: AC
  Filled 2018-01-08: qty 50

## 2018-01-08 MED ORDER — SUCCINYLCHOLINE CHLORIDE 20 MG/ML IJ SOLN
INTRAMUSCULAR | Status: AC
  Filled 2018-01-08: qty 1

## 2018-01-08 MED ORDER — SODIUM CHLORIDE 0.9 % IV SOLN
INTRAVENOUS | Status: DC
  Administered 2018-01-08: 1000 mL via INTRAVENOUS

## 2018-01-08 MED ORDER — PROPOFOL 500 MG/50ML IV EMUL
INTRAVENOUS | Status: AC
  Filled 2018-01-08: qty 150

## 2018-01-08 MED ORDER — PHENYLEPHRINE HCL 10 MG/ML IJ SOLN
INTRAMUSCULAR | Status: AC
  Filled 2018-01-08: qty 1

## 2018-01-08 MED ORDER — LIDOCAINE HCL (PF) 2 % IJ SOLN
INTRAMUSCULAR | Status: AC
  Filled 2018-01-08: qty 10

## 2018-01-08 MED ORDER — GLYCOPYRROLATE 0.2 MG/ML IJ SOLN
INTRAMUSCULAR | Status: AC
  Filled 2018-01-08: qty 1

## 2018-01-08 MED ORDER — PROPOFOL 10 MG/ML IV BOLUS
INTRAVENOUS | Status: DC | PRN
  Administered 2018-01-08: 70 mg via INTRAVENOUS
  Administered 2018-01-08: 30 mg via INTRAVENOUS

## 2018-01-08 NOTE — H&P (Signed)
[name], MD [address], [place], [place], [place], [address] [address], [place], [place], [place], [address] Phone: [phone]  Fax: [phone]  Primary Care Physician:  Valerie Roys, DO   Pre-Procedure History & Physical: HPI:  Shirley Rogers is a 52 y.o. female is here for an endoscopy and colonoscopy    Past Medical History:  Diagnosis Date  . Anxiety    on xanax in the past  . ASCVD (arteriosclerotic cardiovascular disease)    carotids  . Barrett esophagus   . 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 Carotid atherosclerosis 2003   on ultrasound  . Colon polyp   . Complicated migraine    with facial drooping  . Concussion    1974,1982,1987,2016,2016  . DDD (degenerative disc disease), lumbar    MRI 2008, L3-4, L5-S1, spondylotic changes, multilevel facet joint hypertrophic changes  . Depression    on paxil, lexapro, cymbalta in the past  . Diverticulosis   . Dizziness due to old head injury   . Family history of adverse reaction to anesthesia    father - slow to wake  . Fatty liver 2013   On CT abdomen/pelvis  . Fibrocystic breast disease   . Fibromyalgia   . Fracture    left knee medial femoral condyle stress fracture, chondromalacia  . GERD (gastroesophageal reflux disease)   . Hemiplegic migraine    daily  . Hemorrhoids   . History of kidney stones   . Hyperlipidemia   . Hypertension    no meds currently.  . IBS (irritable bowel syndrome)   . Insomnia chronic  . Kidney stone 2013   on CT abdomen/pelvis  . 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 Migraine   . Narrowing of intervertebral disc space 2011   C5-6  . Normal cardiac stress test 2009   EF 66%  . Pneumonia   . Pulmonary emboli ([place])   . Recurrent sinusitis   . Recurrent UTI    question about IC in the past  . Sleep apnea    CPAP machine broken  . Stroke ([place])    2000 - no deficits  . Umbilical hernia 0109     on CT abdomen/pelvis  . Varicose veins    right lower leg  . Vascular spasm ([place])   . Vertigo    from concussive disorder.  seeing neuro 03/23/15    Past Surgical History:  Procedure Laterality Date  . Biopsy Punch Thyroid    . BREAST EXCISIONAL BIOPSY Right 2000   right mass excision - atypical hyperplasia  . BREAST SURGERY Right    atypical hyperplasia  . CHOLECYSTECTOMY N/A 07/24/2015   Procedure: LAPAROSCOPIC CHOLECYSTECTOMY WITH INTRAOPERATIVE CHOLANGIOGRAM;  Surgeon: Leonie Green, MD;  Location: [facility] ORS;  Service: General;  Laterality: N/A;  . COLONOSCOPY W/ BIOPSIES     Removed 5 polyps  . COLONOSCOPY WITH PROPOFOL N/A 03/03/2017   Procedure: COLONOSCOPY WITH PROPOFOL;  Surgeon: [name], MD;  Location: [facility] ENDOSCOPY;  Service: Gastroenterology;  Laterality: N/A;  . KNEE ARTHROSCOPY WITH SUBCHONDROPLASTY Left 05/26/2017   Procedure: LEFT KNEE ARTHROSCOPY WITH SUBCHONDROPLASTY, CHONDROPLASTY, SYNOVECTOMY, POSSIBLE MICROFX;  Surgeon: Leandrew Koyanagi, MD;  Location: [facility];  Service: Orthopedics;  Laterality: Left;  . TUBAL LIGATION      Prior to Admission medications   Medication Sig Start Date End Date Taking? Authorizing Provider  enoxaparin (LOVENOX)  40 MG/0.4ML injection Inject 0.4 mLs (40 mg total) into the skin daily. 12/22/17  Yes Johnson, Megan P, DO  omeprazole (PRILOSEC) 20 MG capsule Take 1 capsule (20 mg total) by mouth at bedtime. 12/22/17  Yes Johnson, Megan P, DO  b complex vitamins tablet Take 1 tablet by mouth daily.    [provider]  buPROPion (WELLBUTRIN SR) 150 MG 12 hr tablet TAKE 1 TABLET BY MOUTH EVERY MORNING FOR 3 DAYS, THEN 1 TABLET TWICE DAILY THEREAFTER 10/08/17   Johnson, Megan P, DO  calcium-vitamin D (OSCAL WITH D) 500-200 MG-UNIT tablet Take 1 tablet by mouth 3 (three) times daily. 05/26/17   Leandrew Koyanagi, MD  diphenhydrAMINE (BENADRYL) 25 mg capsule Take 2 capsules (50 mg total) by mouth every 6 (six) hours as needed. Patient  taking differently: Take 50 mg by mouth every 6 (six) hours as needed (for itching/allergies.).  08/02/16   Park Liter P, DO  DULoxetine (CYMBALTA) 60 MG capsule Take 1 capsule (60 mg total) by mouth daily. 06/23/17   Valerie Roys, DO  Elastic Bandages & Supports (T.E.D. THIGH LENGTH/L-REGULAR) MISC 2 application by Does not apply route every morning. 03/18/16   Merlyn Lot, MD  ELIQUIS 5 MG TABS tablet TAKE 1 TABLET(5 MG) BY MOUTH TWICE DAILY 12/19/17   Johnson, Megan P, DO  EPINEPHRINE 0.3 mg/0.3 mL IJ SOAJ injection INJECT 0.3 ML( 0.3 MG) INTO THE MUSCLE ONCE 12/05/16   Johnson, Megan P, DO  furosemide (LASIX) 20 MG tablet TAKE 1 TABLET(20 MG) BY MOUTH DAILY 09/14/17   Johnson, Megan P, DO  Insulin Pen Needle 31G X 6 MM MISC 1 each by Does not apply route daily. 11/28/17   Johnson, Megan P, DO  Liraglutide -Weight Management (SAXENDA) 18 MG/3ML SOPN Inject 0.6 mg into the skin daily for 7 days, THEN 1.2 mg daily for 7 days, THEN 1.8 mg daily for 7 days, THEN 2.4 mg daily for 7 days, THEN 3 mg daily. 11/22/17 03/20/18  Park Liter P, DO  Magnesium 250 MG TABS Take 1,000 mg by mouth daily.    [provider]  methazolamide (NEPTAZANE) 25 MG tablet Take 1 tablet (25 mg total) by mouth 3 (three) times daily. 04/26/17   Volney American, PA-C  pregabalin (LYRICA) 75 MG capsule Take 75 mg by mouth at bedtime.    [provider]  PROAIR HFA 108 (90 Base) MCG/ACT inhaler INHALE 2 PUFFS BY MOUTH EVERY 4 HOURS AS NEEDED FOR WHEEZING 09/26/16   Johnson, Megan P, DO  traMADol (ULTRAM) 50 MG tablet Take 1 tablet (50 mg total) by mouth 2 (two) times daily as needed. 10/20/17   Johnson, Megan P, DO  traZODone (DESYREL) 100 MG tablet TAKE 1 TABLET(100 MG) BY MOUTH AT BEDTIME AS NEEDED FOR SLEEP 10/12/17   Johnson, Megan P, DO  verapamil (CALAN-SR) 240 MG CR tablet TAKE 1 TABLET(240 MG) BY MOUTH DAILY 07/11/17   Park Liter P, DO    Allergies as of 12/22/2017 - Review Complete  12/22/2017  Allergen Reaction Noted  . Rocephin [ceftriaxone] Anaphylaxis 05/08/2014  . Statins Anaphylaxis, Diarrhea, and Nausea And Vomiting 12/03/2015  . Sulfa antibiotics Diarrhea, Nausea And Vomiting, Rash, and Anaphylaxis 09/28/2001  . Valproate sodium  02/22/2009  . Compazine [prochlorperazine] Other (See Comments) 03/31/2015  . Erythromycin Itching, Nausea And Vomiting, and Rash 03/31/2015  . Lidocaine Swelling 09/28/2001  . Meloxicam  12/03/2015  . Nitrofuran derivatives  09/28/2001  . Ace inhibitors Itching 03/31/2015  .  Biaxin [clarithromycin] Itching, Nausea And Vomiting, and Rash 05/08/2014  . Doxycycline Nausea And Vomiting 08/13/2015  . Latex Rash 03/31/2015  . Morphine and related Nausea And Vomiting 09/28/2001  . Sulfamethoxazole-trimethoprim Diarrhea and Nausea And Vomiting 05/04/2015    Family History  Problem Relation Age of Onset  . Other Mother        IBS  . Hypertension Mother   . Heart disease Father   . Diverticulosis Father   . Migraines Father   . Other Son        Ulcerative Colitis, Polonidal cyst  . Parkinson's disease Maternal Grandmother   . Cancer Maternal Grandmother   . Mental illness Paternal Grandmother   . Stroke Paternal Grandmother   . Cancer Paternal Grandmother        brain cancer  . Bipolar disorder Paternal Grandmother   . Stroke Maternal Grandfather   . Cancer Maternal Grandfather        Bladder, Prostate/bladder/prostate  . Heart disease Paternal Grandfather   . Heart attack Paternal Grandfather   . Macular degeneration Unknown   . Diabetes Brother   . Breast cancer Maternal Aunt     Social History   Socioeconomic History  . Marital status: Divorced    Spouse name: Not on file  . Number of children: 1  . Years of education: 13  . 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 Financial resource strain: Not on file  . Food insecurity:    Worry: Not  on file    Inability: Not on file  . Transportation needs:    Medical: Not on file    Non-medical: Not on file  Tobacco Use  . Smoking status: Current Some Day Smoker    Types: Cigarettes    Last attempt to quit: 06/12/2015    Years since quitting: 2.5  . Smokeless tobacco: Never Used  . Tobacco comment: 1-2 cigarettes some days  Substance and Sexual Activity  . Alcohol use: No    Alcohol/week: 0.0 standard drinks  . Drug use: No  . Sexual activity: Never    Comment: tubal ligation and menopause  Lifestyle  . Physical activity:    Days per week: Not on file    Minutes per session: Not on file  . Stress: Not on file  Relationships  . Social connections:    Talks on phone: Not on file    Gets together: Not on file    Attends religious service: Not on file    Active member of club or organization: Not on file    Attends meetings of clubs or organizations: Not on file    Relationship status: Not on file  . Intimate partner violence:    Fear of current or ex partner: Not on file    Emotionally abused: Not on file    Physically abused: Not on file    Forced sexual activity: Not on file  Other Topics Concern  . Not on file  Social History Narrative   Lives at home with friend   Caffeine use- coffee 1 cup daily    Review of Systems: See HPI, otherwise negative ROS  Physical Exam: BP (!) 137/94   Pulse 79   Temp (!) 97.5 F (36.4 C)   Resp 18   Ht 5\' 4"  (1.626 m)   Wt 105.2 kg   LMP 07/01/2013 Comment: tubal  SpO2 98%   BMI 39.82  kg/m  General:   Alert,  pleasant and cooperative in NAD Head:  Normocephalic and atraumatic. Neck:  Supple; no masses or thyromegaly. Lungs:  Clear throughout to auscultation, normal respiratory effort.    Heart:  +S1, +S2, Regular rate and rhythm, No edema. Abdomen:  Soft, nontender and nondistended. Normal bowel sounds, without guarding, and without rebound.   Neurologic:  Alert and  oriented x4;  grossly normal  neurologically.  Impression/Plan: Shirley Rogers is here for an endoscopy and colonoscopy  to be performed for  evaluation of abdominal pain    Risks, benefits, limitations, and alternatives regarding endoscopy have been reviewed with the patient.  Questions have been answered.  All parties agreeable.   [name], MD  01/08/2018, 8:03 AM

## 2018-01-08 NOTE — Anesthesia Preprocedure Evaluation (Signed)
Anesthesia Evaluation  Patient identified by MRN, date of birth, ID band Patient awake    Reviewed: Allergy & Precautions, H&P , NPO status , Patient's Chart, lab work & pertinent test results  History of Anesthesia Complications (+) Family history of anesthesia reactionNegative for: history of anesthetic complications  Airway Mallampati: III  TM Distance: <3 FB Neck ROM: limited    Dental  (+) Chipped, Poor Dentition, Missing, Partial Lower   Pulmonary sleep apnea , pneumonia, COPD, Current Smoker,           Cardiovascular Exercise Tolerance: Good hypertension, + Peripheral Vascular Disease       Neuro/Psych  Headaches, PSYCHIATRIC DISORDERS Anxiety Depression Dementia  Neuromuscular disease CVA, Residual Symptoms negative neurological ROS     GI/Hepatic Neg liver ROS, GERD  Medicated and Controlled,  Endo/Other  negative endocrine ROS  Renal/GU Renal diseasenegative Renal ROS  negative genitourinary   Musculoskeletal  (+) Arthritis , Fibromyalgia -  Abdominal   Peds  Hematology negative hematology ROS (+)   Anesthesia Other Findings Past Medical History: No date: Anxiety     Comment:  on xanax in the past No date: ASCVD (arteriosclerotic cardiovascular disease)     Comment:  carotids No date: Barrett esophagus No date: Behcet's syndrome [facility]) 2003: Carotid atherosclerosis     Comment:  on ultrasound No date: Colon polyp No date: Complicated migraine     Comment:  with facial drooping No date: Concussion     Comment:  1974,1982,1987,2016,2016 No date: DDD (degenerative disc disease), lumbar     Comment:  MRI 2008, L3-4, L5-S1, spondylotic changes, multilevel               facet joint hypertrophic changes No date: Depression     Comment:  on paxil, lexapro, cymbalta in the past No date: Diverticulosis No date: Dizziness due to old head injury No date: Family history of adverse reaction to anesthesia   Comment:  father - slow to wake 2013: Fatty liver     Comment:  On CT abdomen/pelvis No date: Fibrocystic breast disease No date: Fibromyalgia No date: Fracture     Comment:  left knee medial femoral condyle stress fracture,               chondromalacia No date: GERD (gastroesophageal reflux disease) No date: Hemiplegic migraine     Comment:  daily No date: Hemorrhoids No date: History of kidney stones No date: Hyperlipidemia No date: Hypertension     Comment:  no meds currently. No date: IBS (irritable bowel syndrome) chronic: Insomnia 2013: Kidney stone     Comment:  on CT abdomen/pelvis 1990: Lupus ([place])     Comment:  multiple plaques on MRI consistent with lupus vasculitis              in 2003, question Behcet's  No date: Migraine 2011: Narrowing of intervertebral disc space     Comment:  C5-6 2009: Normal cardiac stress test     Comment:  EF 66% No date: Pneumonia No date: Pulmonary emboli ([facility]) No date: Recurrent sinusitis No date: Recurrent UTI     Comment:  question about IC in the past No date: Sleep apnea     Comment:  CPAP machine broken No date: Stroke [facility])     Comment:  2000 - no deficits 2956: Umbilical hernia     Comment:  on CT abdomen/pelvis No date: Varicose veins     Comment:  right lower leg No date: Vascular spasm ([place])  No date: Vertigo     Comment:  from concussive disorder.  seeing neuro 03/23/15  Past Surgical History: No date: Biopsy Punch Thyroid 2000: BREAST EXCISIONAL BIOPSY; Right     Comment:  right mass excision - atypical hyperplasia No date: BREAST SURGERY; Right     Comment:  atypical hyperplasia 07/24/2015: CHOLECYSTECTOMY; N/A     Comment:  Procedure: LAPAROSCOPIC CHOLECYSTECTOMY WITH               INTRAOPERATIVE CHOLANGIOGRAM;  Surgeon: Leonie Green, MD;  Location: [facility] ORS;  Service: General;                Laterality: N/A; No date: COLONOSCOPY W/ BIOPSIES     Comment:  Removed 5 polyps 03/03/2017:  COLONOSCOPY WITH PROPOFOL; N/A     Comment:  Procedure: COLONOSCOPY WITH PROPOFOL;  Surgeon: [name], MD;  Location: [facility] ENDOSCOPY;  Service:               Gastroenterology;  Laterality: N/A; 05/26/2017: KNEE ARTHROSCOPY WITH SUBCHONDROPLASTY; Left     Comment:  Procedure: LEFT KNEE ARTHROSCOPY WITH SUBCHONDROPLASTY,               CHONDROPLASTY, SYNOVECTOMY, POSSIBLE MICROFX;  Surgeon:               Leandrew Koyanagi, MD;  Location: [place];  Service:               Orthopedics;  Laterality: Left; No date: TUBAL LIGATION  BMI    Body Mass Index:  39.82 kg/m      Reproductive/Obstetrics negative OB ROS                             Anesthesia Physical Anesthesia Plan  ASA: III  Anesthesia Plan: General   Post-op Pain Management:    Induction: Intravenous  PONV Risk Score and Plan: Propofol infusion and TIVA  Airway Management Planned: Natural Airway and Nasal Cannula  Additional Equipment:   Intra-op Plan:   Post-operative Plan:   Informed Consent: I have reviewed the patients History and Physical, chart, labs and discussed the procedure including the risks, benefits and alternatives for the proposed anesthesia with the patient or authorized representative who has indicated his/her understanding and acceptance.   Dental Advisory Given  Plan Discussed with: Anesthesiologist, CRNA and Surgeon  Anesthesia Plan Comments: (Patient consented for risks of anesthesia including but not limited to:  - adverse reactions to medications - risk of intubation if required - damage to teeth, lips or other oral mucosa - sore throat or hoarseness - Damage to heart, brain, lungs or loss of life  Patient voiced understanding.)        Anesthesia Quick Evaluation

## 2018-01-08 NOTE — Op Note (Signed)
[facility] Patient Name: Shirley Rogers Procedure Date: 01/08/2018 8:10 AM MRN: [id number] Account #: [id number] Date of Birth: [birth year] Admit Type: Outpatient Age: 52 Room: [facility] ENDO ROOM 4 Gender: Female Note Status: Finalized Procedure:            Upper GI endoscopy Indications:          Abdominal pain Providers:            [name] MD, MD Referring MD:         No Local Md, MD (Referring MD) Medicines:            Monitored Anesthesia Care Complications:        No immediate complications. Procedure:            Pre-Anesthesia Assessment:                       - ASA Grade Assessment: III - A patient with severe                        systemic disease.                       - Prior to the procedure, a History and Physical was                        performed, and patient medications, allergies and                        sensitivities were reviewed. The patient's tolerance of                        previous anesthesia was reviewed.                       - The risks and benefits of the procedure and the                        sedation options and risks were discussed with the                        patient. All questions were answered and informed                        consent was obtained.                       After obtaining informed consent, the endoscope was                        passed under direct vision. Throughout the procedure,                        the patient's blood pressure, pulse, and oxygen                        saturations were monitored continuously. The Endoscope                        was introduced through the mouth, and advanced to the  third part of duodenum. The upper GI endoscopy was                        accomplished with ease. The patient tolerated the                        procedure well. Findings:      The examined duodenum was normal.      A small amount of food (residue) was found in  the gastric body.      A 3 cm hiatal hernia was present.      Normal mucosa was found in the entire examined stomach. Biopsies were       taken with a cold forceps for histology.      Circumferential salmon-colored mucosa was present from 34 to 36 cm. No       other visible abnormalities were present. The maximum longitudinal       extent of these esophageal mucosal changes was 3 cm in length. Biopsies       were taken with a cold forceps for histology.      The cardia and gastric fundus were normal on retroflexion. Impression:           - Normal examined duodenum.                       - A small amount of food (residue) in the stomach.                       - 3 cm hiatal hernia.                       - Normal mucosa was found in the entire stomach.                        Biopsied.                       - Salmon-colored mucosa suspicious for short-segment                        Barrett's esophagus. Biopsied. Recommendation:       - Await pathology results.                       - Perform a colonoscopy today. Procedure Code(s):    --- Professional ---                       [phone], Esophagogastroduodenoscopy, flexible, transoral;                        with biopsy, single or multiple Diagnosis Code(s):    --- Professional ---                       K22.8, Other specified diseases of esophagus                       K44.9, Diaphragmatic hernia without obstruction or                        gangrene  R10.9, Unspecified abdominal pain CPT copyright 2017 American Medical Association. All rights reserved. The codes documented in this report are preliminary and upon coder review may  be revised to meet current compliance requirements. [name], MD [name] MD, MD 01/08/2018 8:22:36 AM This report has been signed electronically. Number of Addenda: 0 Note Initiated On: 01/08/2018 8:10 AM      [facility]

## 2018-01-08 NOTE — Anesthesia Post-op Follow-up Note (Signed)
Anesthesia QCDR form completed.        

## 2018-01-08 NOTE — Anesthesia Postprocedure Evaluation (Signed)
Anesthesia Post Note  Patient: [name]  Procedure(s) Performed: COLONOSCOPY WITH PROPOFOL (N/A ) ESOPHAGOGASTRODUODENOSCOPY (EGD) WITH PROPOFOL (N/A )  Patient location during evaluation: Endoscopy Anesthesia Type: General Level of consciousness: awake and alert Pain management: pain level controlled Vital Signs Assessment: post-procedure vital signs reviewed and stable Respiratory status: spontaneous breathing, nonlabored ventilation, respiratory function stable and patient connected to nasal cannula oxygen Cardiovascular status: blood pressure returned to baseline and stable Postop Assessment: no apparent nausea or vomiting Anesthetic complications: no     Last Vitals:  Vitals:   01/08/18 0850 01/08/18 0900  BP: 126/68 126/87  Pulse: 88 89  Resp: 18 15  Temp:    SpO2: 100% 99%    Last Pain:  Vitals:   01/08/18 0723  PainSc: 6                  Precious Haws Piscit[place]

## 2018-01-08 NOTE — Op Note (Signed)
[facility] Patient Name: Shirley Rogers Procedure Date: 01/08/2018 8:08 AM MRN: [id number] Account #: [id number] Date of Birth: August 06, 1965 Admit Type: Outpatient Age: 52 Room: [facility] ENDO ROOM 4 Gender: Female Note Status: Finalized Procedure:            Colonoscopy Indications:          Abdominal pain Providers:            [name] MD, MD Referring MD:         No Local Md, MD (Referring MD) Medicines:            Monitored Anesthesia Care Complications:        No immediate complications. Procedure:            Pre-Anesthesia Assessment:                       - Prior to the procedure, a History and Physical was                        performed, and patient medications, allergies and                        sensitivities were reviewed. The patient's tolerance of                        previous anesthesia was reviewed.                       - The risks and benefits of the procedure and the                        sedation options and risks were discussed with the                        patient. All questions were answered and informed                        consent was obtained.                       - ASA Grade Assessment: III - A patient with severe                        systemic disease.                       After obtaining informed consent, the colonoscope was                        passed under direct vision. Throughout the procedure,                        the patient's blood pressure, pulse, and oxygen                        saturations were monitored continuously. The                        Colonoscope was introduced through the anus and                        advanced  to the the cecum, identified by the                        appendiceal orifice, IC valve and transillumination.                        The colonoscopy was performed with ease. The patient                        tolerated the procedure well. The quality of the bowel             preparation was good. Findings:      The perianal and digital rectal examinations were normal.      A 5 mm polyp was found in the ascending colon. The polyp was sessile.       The polyp was removed with a cold snare. Resection and retrieval were       complete.      Two sessile polyps were found in the rectum. The polyps were 4 to 5 mm       in size. These polyps were removed with a cold snare. Resection and       retrieval were complete.      The exam was otherwise without abnormality on direct and retroflexion       views. Impression:           - One 5 mm polyp in the ascending colon, removed with a                        cold snare. Resected and retrieved.                       - Two 4 to 5 mm polyps in the rectum, removed with a                        cold snare. Resected and retrieved.                       - The examination was otherwise normal on direct and                        retroflexion views. Recommendation:       - Discharge patient to home (with escort).                       - Resume previous diet.                       - Continue present medications.                       - Await pathology results.                       - Repeat colonoscopy in 3 - 5 years for surveillance                        based on pathology results. Procedure Code(s):    --- Professional ---                       [phone], Colonoscopy, flexible; with removal of tumor(s),  polyp(s), or other lesion(s) by snare technique Diagnosis Code(s):    --- Professional ---                       D12.2, Benign neoplasm of ascending colon                       K62.1, Rectal polyp                       R10.9, Unspecified abdominal pain CPT copyright 2017 American Medical Association. All rights reserved. The codes documented in this report are preliminary and upon coder review may  be revised to meet current compliance requirements. [name], MD [name] MD, MD 01/08/2018  8:44:48 AM This report has been signed electronically. Number of Addenda: 0 Note Initiated On: 01/08/2018 8:08 AM Scope Withdrawal Time: 0 hours 15 minutes 40 seconds  Total Procedure Duration: 0 hours 18 minutes 6 seconds       [facility]

## 2018-01-08 NOTE — Transfer of Care (Signed)
Immediate Anesthesia Transfer of Care Note  Patient: [name]  Procedure(s) Performed: COLONOSCOPY WITH PROPOFOL (N/A ) ESOPHAGOGASTRODUODENOSCOPY (EGD) WITH PROPOFOL (N/A )  Patient Location: PACU  Anesthesia Type:General  Level of Consciousness: awake, alert  and oriented  Airway & Oxygen Therapy: Patient Spontanous Breathing and Patient connected to nasal cannula oxygen  Post-op Assessment: Report given to RN and Post -op Vital signs reviewed and stable  Post vital signs: Reviewed and stable  Last Vitals:  Vitals Value Taken Time  BP 119/73 01/08/2018  8:48 AM  Temp 36.2 C 01/08/2018  8:48 AM  Pulse 89 01/08/2018  8:48 AM  Resp 14 01/08/2018  8:48 AM  SpO2 100 % 01/08/2018  8:48 AM    Last Pain:  Vitals:   01/08/18 0723  PainSc: 6          Complications: No apparent anesthesia complications

## 2018-01-09 ENCOUNTER — Encounter: Payer: Self-pay | Admitting: Gastroenterology

## 2018-01-10 ENCOUNTER — Telehealth: Payer: Self-pay

## 2018-01-10 ENCOUNTER — Other Ambulatory Visit: Payer: Self-pay

## 2018-01-10 LAB — SURGICAL PATHOLOGY

## 2018-01-10 MED ORDER — LINACLOTIDE 290 MCG PO CAPS: 290.0000 ug | ORAL_CAPSULE | Freq: Every day | ORAL | 6 refills | Status: AC

## 2018-01-10 NOTE — Telephone Encounter (Signed)
-----   Message from [name], MD sent at 01/10/2018 11:35 AM EDT ----- [name] inform - biopsies confirm Barretts esophagus with no dysplasia. Repeat EGD in 3 years, continue PPI. colonsocopy showed two adenomas- repeat in 5 years.   C/c Johnson, Megan P, DO

## 2018-01-10 NOTE — Telephone Encounter (Signed)
Left vm for pt to return my call. Message regarding results have been sent to pt's Mychart.

## 2018-01-12 NOTE — Progress Notes (Deleted)
**  Chest x-ray a 12/06/2017>> imaging personally reviewed, increased lung markings, suggestive of chronic bronchitis, lungs are otherwise unremarkable.

## 2018-01-14 ENCOUNTER — Other Ambulatory Visit: Payer: Self-pay | Admitting: Family Medicine

## 2018-01-15 ENCOUNTER — Institutional Professional Consult (permissible substitution): Payer: BC Managed Care – PPO | Admitting: Internal Medicine

## 2018-01-15 NOTE — Telephone Encounter (Signed)
Verapamil refill Last Refill:07/11/17 # 90 1 RF Last OV: 07/11/17 PCP: Park Liter DO Pharmacy:[facility] [address]

## 2018-01-18 ENCOUNTER — Ambulatory Visit (INDEPENDENT_AMBULATORY_CARE_PROVIDER_SITE_OTHER): Payer: BC Managed Care – PPO | Admitting: Orthopaedic Surgery

## 2018-01-18 ENCOUNTER — Telehealth (INDEPENDENT_AMBULATORY_CARE_PROVIDER_SITE_OTHER): Payer: Self-pay

## 2018-01-18 ENCOUNTER — Encounter (INDEPENDENT_AMBULATORY_CARE_PROVIDER_SITE_OTHER): Payer: Self-pay | Admitting: Orthopaedic Surgery

## 2018-01-18 DIAGNOSIS — M170 Bilateral primary osteoarthritis of knee: Secondary | ICD-10-CM

## 2018-01-18 MED ORDER — KETOROLAC TROMETHAMINE 30 MG/ML IJ SOLN: 60.0000 mg | Freq: Once | INTRAMUSCULAR | Status: AC

## 2018-01-18 MED ORDER — BUPIVACAINE HCL 0.25 % IJ SOLN
0.6600 mL | INTRAMUSCULAR | Status: AC | PRN
Start: 2018-01-18 — End: 2018-01-18
  Administered 2018-01-18: .66 mL via INTRA_ARTICULAR

## 2018-01-18 MED ORDER — LIDOCAINE HCL 1 % IJ SOLN
3.0000 mL | INTRAMUSCULAR | Status: AC | PRN
  Administered 2018-01-18: 3 mL

## 2018-01-18 MED ORDER — BUPIVACAINE HCL 0.25 % IJ SOLN
0.6600 mL | INTRAMUSCULAR | Status: AC | PRN
  Administered 2018-01-18: .66 mL via INTRA_ARTICULAR

## 2018-01-18 NOTE — Telephone Encounter (Signed)
Please submit for BIL Gel injections-XU. Thank you.

## 2018-01-18 NOTE — Progress Notes (Signed)
Office Visit Note   Patient: Shirley Rogers           Date of Birth: 07/07/1965           MRN: [id number] Visit Date: 01/18/2018              Requested by: Valerie Roys, DO [place], [place] [address] PCP: Valerie Roys, DO   Assessment & Plan: Visit Diagnoses:  1. Primary localized osteoarthritis of both knees     Plan: Impression is bilateral knee osteoarthritis.  Today, we will inject both knees with Toradol.   We will also send approval for bilateral knee Visco supplementation injections.  We will call her once these have been approved.  She will continue to make all efforts at weight loss.  She is aware that she will need bilateral sequential total knee replacements in the near future.  She will follow-up with [place] as needed.  Call if concerns or questions in the meantime.  Follow-Up Instructions: Return if symptoms worsen or fail to improve.   Orders:  Orders Placed This Encounter  Procedures  . Large Joint Inj: bilateral knee   No orders of the defined types were placed in this encounter.     Procedures: Large Joint Inj: bilateral knee on 01/18/2018 9:43 AM Indications: pain Details: 22 G needle, anterolateral approach Medications (Right): 0.66 mL bupivacaine 0.25 %; 3 mL lidocaine 1 % Medications (Left): 0.66 mL bupivacaine 0.25 %; 3 mL lidocaine 1 %      Clinical Data: No additional findings.   Subjective: Chief Complaint  Patient presents with  . Left Knee - Pain  . Right Knee - Pain    HPI patient is a pleasant 52 year old female who presents to our clinic today with recurrent bilateral knee pain.  History of osteoarthritis to both knees right greater than left.  She has been getting cortisone injections with the last ones being in June 2019.  The significantly helped, but only lasted about 1 month.  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  Worse at the end of the day and when she is trying to sleep  at night.  She has been taking tramadol and NSAIDs with mild relief of symptoms.  She does have a history of autoimmune disease and a PE approximately 1 year ago for which she is anticoagulated with Eliquis.  Of note, she has lost 20 pounds since we saw her last.  Her BMI is now just under 40.  She does know that bilateral sequential total knee replacements are in her near future.  Review of Systems as detailed in HPI.  All others reviewed and are negative.   Objective: Vital Signs: LMP 07/01/2013 Comment: tubal  Physical Exam well-developed and well-nourished female no acute distress.  Alert and oriented x3.  Ortho Exam examination of both knees reveals range of motion from 0 to 110 degrees.  Varus deformity.  Medial joint line tenderness.  Moderate patellofemoral crepitus.  Ligaments are stable.  She is neurovascularly intact distally.  Specialty Comments:  No specialty comments available.  Imaging: No new imaging   PMFS History: Patient Active Problem List   Diagnosis Date Noted  . Chronic pain of both knees 11/17/2017  . Morbid obesity ([place]) 11/17/2017  . Body mass index 40.0-44.9, adult ([place]) 11/17/2017  . Unilateral primary osteoarthritis, right knee 07/11/2017  . S/P left knee arthroscopy 06/06/2017  . Stress fracture of femur  05/16/2017  . Chondromalacia patellae, left knee 05/16/2017  . Primary localized osteoarthritis of both knees 04/26/2017  . ASCUS with positive high risk HPV cervical 01/18/2017  . Increased intracranial pressure 10/06/2016  . Vestibular hypofunction of left ear 09/21/2016  . Intracranial hypertension, benign 09/21/2016  . Chronic daily headache 09/21/2016  . Inflammatory arthritis 08/28/2016  . Nephrolithiasis 07/22/2016  . Dysuria 07/22/2016  . 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 Microhematuria 03/15/2016  . Pelvic floor dysfunction 03/15/2016  . Urethral caruncle 03/15/2016  . History of  pulmonary embolus (PE) 03/11/2016  . Facial droop 02/04/2016  . Right hemiparesis ([place]) 02/04/2016  . Tobacco use 12/03/2015  . ANA positive 10/19/2015  . Transient alteration of awareness 09/24/2015  . Snoring 06/10/2015  . Sleep apnea 06/10/2015  . 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 Globus sensation 03/30/2015  . Post concussion syndrome 03/13/2015  . Depression 03/13/2015  . Benign hypertensive renal disease   . Barrett esophagus   . Diverticulosis   . Hemiplegic migraine   . Lupus ([place])   . History of stroke   . Fall 05/25/2014   Past Medical History:  Diagnosis Date  . Anxiety    on xanax in the past  . ASCVD (arteriosclerotic cardiovascular disease)    carotids  . Barrett esophagus   . 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 Carotid atherosclerosis 2003   on ultrasound  . Colon polyp   . Complicated migraine    with facial drooping  . Concussion    1974,1982,1987,2016,2016  . DDD (degenerative disc disease), lumbar    MRI 2008, L3-4, L5-S1, spondylotic changes, multilevel facet joint hypertrophic changes  . Depression    on paxil, lexapro, cymbalta in the past  . Diverticulosis   . Dizziness due to old head injury   . Family history of adverse reaction to anesthesia    father - slow to wake  . Fatty liver 2013   On CT abdomen/pelvis  . Fibrocystic breast disease   . Fibromyalgia   . Fracture    left knee medial femoral condyle stress fracture, chondromalacia  . GERD (gastroesophageal reflux disease)   . Hemiplegic migraine    daily  . Hemorrhoids   . History of kidney stones   . Hyperlipidemia   . Hypertension    no meds currently.  . IBS (irritable bowel syndrome)   . Insomnia chronic  . Kidney stone 2013   on CT abdomen/pelvis  . Lupus ([place]) 1990   multiple plaques on MRI consistent with lupus vasculitis in 2003, question Behcet's   . Migraine   . Narrowing of intervertebral disc space 2011   C5-6    . Normal cardiac stress test 2009   EF 66%  . Pneumonia   . Pulmonary emboli ([place])   . Recurrent sinusitis   . Recurrent UTI    question about IC in the past  . Sleep apnea    CPAP machine broken  . Stroke ([place])    2000 - no deficits  . Umbilical hernia 3383   on CT abdomen/pelvis  . Varicose veins    right lower leg  . Vascular spasm ([place])   . Vertigo    from concussive disorder.  seeing neuro 03/23/15    Family History  Problem Relation Age of Onset  . Other Mother        IBS  . Hypertension Mother   .  Heart disease Father   . Diverticulosis Father   . Migraines Father   . Other Son        Ulcerative Colitis, Polonidal cyst  . Parkinson's disease Maternal Grandmother   . Cancer Maternal Grandmother   . Mental illness Paternal Grandmother   . Stroke Paternal Grandmother   . Cancer Paternal Grandmother        brain cancer  . Bipolar disorder Paternal Grandmother   . Stroke Maternal Grandfather   . Cancer Maternal Grandfather        Bladder, Prostate/bladder/prostate  . Heart disease Paternal Grandfather   . Heart attack Paternal Grandfather   . Macular degeneration Unknown   . Diabetes Brother   . Breast cancer Maternal Aunt     Past Surgical History:  Procedure Laterality Date  . Biopsy Punch Thyroid    . BREAST EXCISIONAL BIOPSY Right 2000   right mass excision - atypical hyperplasia  . BREAST SURGERY Right    atypical hyperplasia  . CHOLECYSTECTOMY N/A 07/24/2015   Procedure: LAPAROSCOPIC CHOLECYSTECTOMY WITH INTRAOPERATIVE CHOLANGIOGRAM;  Surgeon: Leonie Green, MD;  Location: [facility] ORS;  Service: General;  Laterality: N/A;  . COLONOSCOPY W/ BIOPSIES     Removed 5 polyps  . COLONOSCOPY WITH PROPOFOL N/A 03/03/2017   Procedure: COLONOSCOPY WITH PROPOFOL;  Surgeon: [name], MD;  Location: [facility] ENDOSCOPY;  Service: Gastroenterology;  Laterality: N/A;  . COLONOSCOPY WITH PROPOFOL N/A 01/08/2018   Procedure: COLONOSCOPY WITH PROPOFOL;  Surgeon: [name], MD;  Location: [facility] ENDOSCOPY;  Service: Gastroenterology;  Laterality: N/A;  . ESOPHAGOGASTRODUODENOSCOPY (EGD) WITH PROPOFOL N/A 01/08/2018   Procedure: ESOPHAGOGASTRODUODENOSCOPY (EGD) WITH PROPOFOL;  Surgeon: [name], MD;  Location: [facility] ENDOSCOPY;  Service: Gastroenterology;  Laterality: N/A;  . KNEE ARTHROSCOPY WITH SUBCHONDROPLASTY Left 05/26/2017   Procedure: LEFT KNEE ARTHROSCOPY WITH SUBCHONDROPLASTY, CHONDROPLASTY, SYNOVECTOMY, POSSIBLE MICROFX;  Surgeon: Leandrew Koyanagi, MD;  Location: [place];  Service: Orthopedics;  Laterality: Left;  . 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 Smoking status: Current Some Day Smoker    Types: Cigarettes    Last attempt to quit: 06/12/2015    Years since quitting: 2.6  . Smokeless tobacco: Never Used  . Tobacco comment: 1-2 cigarettes some days  Substance and Sexual Activity  . Alcohol use: No    Alcohol/week: 0.0 standard drinks  . Drug use: No  . Sexual activity: Never    Comment: tubal ligation and menopause

## 2018-01-20 ENCOUNTER — Other Ambulatory Visit: Payer: Self-pay | Admitting: Family Medicine

## 2018-01-22 NOTE — Telephone Encounter (Signed)
Noted  

## 2018-01-23 ENCOUNTER — Telehealth (INDEPENDENT_AMBULATORY_CARE_PROVIDER_SITE_OTHER): Payer: Self-pay

## 2018-01-23 NOTE — Telephone Encounter (Signed)
Submitted VOB for SynviscOne, bilateral knee. 

## 2018-01-24 ENCOUNTER — Encounter: Payer: Self-pay | Admitting: Internal Medicine

## 2018-01-24 ENCOUNTER — Ambulatory Visit (INDEPENDENT_AMBULATORY_CARE_PROVIDER_SITE_OTHER): Payer: BC Managed Care – PPO | Admitting: Internal Medicine

## 2018-01-24 VITALS — BP 120/82 | HR 83 | Ht 64.0 in | Wt 237.0 lb

## 2018-01-24 DIAGNOSIS — R0609 Other forms of dyspnea: Secondary | ICD-10-CM | POA: Diagnosis not present

## 2018-01-24 MED ORDER — FLUTICASONE FUROATE-VILANTEROL 200-25 MCG/INH IN AEPB: 1.0000 | INHALATION_SPRAY | Freq: Every day | RESPIRATORY_TRACT | 3 refills | Status: DC

## 2018-01-24 NOTE — Progress Notes (Signed)
[facility] Consultation      Assessment and Plan:  Dyspnea on exertion, uncertain etiology, in the setting of history of pulmonary embolism, multiple comorbidities. Physical deconditioning, morbid obesity (BMI 40). Obstructive sleep apnea, currently on CPAP. Nicotine abuse.  - Patient was ambulated in the office today without evidence of oxygen desaturation, minimal dyspnea no patient noted sensation of "can't get air in" - Discussed that some of symptoms may be secondary to deconditioning, obesity as well as her underlying comorbid conditions.  However we will look into other conditions to make sure that these are not contributory, in particular want to make sure that she does not have a recurrent blood clot or pulmonary hypertension. - I am therefore sending the patient for a echocardiogram as well as a CT angios, and pulmonary function test. -I have started the patient on Breo inhaler empirically to see if this helps with her breathing. - I have encouraged her to try to increase her activity level as tolerated.   Date: 01/24/2018  MRN# [id number] Shirley Rogers 1966-01-26  Referring Physician: Dr. Wynetta Emery for dyspnea.   Shirley Rogers is a 52 y.o. old female seen in consultation for chief complaint of:    Chief Complaint  Patient presents with  . Consult    SOB most of the time: dry cough: chest tightness: wheezing    HPI:   The patient is a 52 yo female, her history includes lupus, Behcett's, OSA, CVA, barretts esophagus, morbid obesity, PE.  Patient presents with symptoms of dyspnea on exertion, patient notes difficulty as having trouble catching her breath or taking a full breath after walking a small distance or performing regular household chores.  She notes these chores can include washing dishes, folding laundry or drying her hair.  The symptoms have been present for at least 6 months.  Symptoms are better with slowing down, worse with activity, raising  arms, standing up from a sitting or laying position. She is a current smoker of 1 to 2 cigarettes/day  The symptoms are variable and can come on even while sitting. She had a PE 2 years ago, she remains on eliquis.  She has reflux and is on prilosec.  Denies sinus drainage, she has OSA and is using CPAP nightly.  No respiratory diagnosis, but she was tried on an inhaler, she does not use it, she tries it but it does not help.  Currently she had a sensation that someone is pushing on her chest. She has been feeling more stressed recently.   **Desat walk 01/24/18>> At rest on RA her sat was 98% and HR 87, Patient walked a total of 360 feet at brisk pace without visible dyspnea, sat was 98% and Hr 90. Pt felt "can't get air in" **CXR 12/06/17>> Images personally reviewed; lung are unremarkable.  **Echocardiogram 06/11/2016>> EF equals 55%  PMHX:   Past Medical History:  Diagnosis Date  . Anxiety    on xanax in the past  . ASCVD (arteriosclerotic cardiovascular disease)    carotids  . Barrett esophagus   . 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 Carotid atherosclerosis 2003   on ultrasound  . Colon polyp   . Complicated migraine    with facial drooping  . Concussion    1974,1982,1987,2016,2016  . DDD (degenerative disc disease), lumbar    MRI 2008, L3-4, L5-S1, spondylotic changes, multilevel facet joint hypertrophic changes  . Depression    on paxil, lexapro, cymbalta in the past  .  Diverticulosis   . Dizziness due to old head injury   . Family history of adverse reaction to anesthesia    father - slow to wake  . Fatty liver 2013   On CT abdomen/pelvis  . Fibrocystic breast disease   . Fibromyalgia   . Fracture    left knee medial femoral condyle stress fracture, chondromalacia  . GERD (gastroesophageal reflux disease)   . Hemiplegic migraine    daily  . Hemorrhoids   . History of kidney stones   . Hyperlipidemia   . Hypertension    no meds currently.  . IBS (irritable bowel  syndrome)   . Insomnia chronic  . Kidney stone 2013   on CT abdomen/pelvis  . 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 Migraine   . Narrowing of intervertebral disc space 2011   C5-6  . Normal cardiac stress test 2009   EF 66%  . Pneumonia   . Pulmonary emboli ([place])   . Recurrent sinusitis   . Recurrent UTI    question about IC in the past  . Sleep apnea    CPAP machine broken  . Stroke ([place])    2000 - no deficits  . Umbilical hernia 8016   on CT abdomen/pelvis  . Varicose veins    right lower leg  . Vascular spasm ()   . Vertigo    from concussive disorder.  seeing neuro 03/23/15   Surgical Hx:  Past Surgical History:  Procedure Laterality Date  . Biopsy Punch Thyroid    . BREAST EXCISIONAL BIOPSY Right 2000   right mass excision - atypical hyperplasia  . BREAST SURGERY Right    atypical hyperplasia  . CHOLECYSTECTOMY N/A 07/24/2015   Procedure: LAPAROSCOPIC CHOLECYSTECTOMY WITH INTRAOPERATIVE CHOLANGIOGRAM;  Surgeon: Leonie Green, MD;  Location: [facility] ORS;  Service: General;  Laterality: N/A;  . COLONOSCOPY W/ BIOPSIES     Removed 5 polyps  . COLONOSCOPY WITH PROPOFOL N/A 03/03/2017   Procedure: COLONOSCOPY WITH PROPOFOL;  Surgeon: [name], MD;  Location: [facility] ENDOSCOPY;  Service: Gastroenterology;  Laterality: N/A;  . COLONOSCOPY WITH PROPOFOL N/A 01/08/2018   Procedure: COLONOSCOPY WITH PROPOFOL;  Surgeon: [name], MD;  Location: [facility] ENDOSCOPY;  Service: Gastroenterology;  Laterality: N/A;  . ESOPHAGOGASTRODUODENOSCOPY (EGD) WITH PROPOFOL N/A 01/08/2018   Procedure: ESOPHAGOGASTRODUODENOSCOPY (EGD) WITH PROPOFOL;  Surgeon: [name], MD;  Location: [facility] ENDOSCOPY;  Service: Gastroenterology;  Laterality: N/A;  . KNEE ARTHROSCOPY WITH SUBCHONDROPLASTY Left 05/26/2017   Procedure: LEFT KNEE ARTHROSCOPY WITH SUBCHONDROPLASTY, CHONDROPLASTY, SYNOVECTOMY, POSSIBLE MICROFX;  Surgeon: Leandrew Koyanagi, MD;  Location: [facility];  Service: Orthopedics;  Laterality: Left;  . TUBAL LIGATION     Family Hx:  Family History  Problem Relation Age of Onset  . Other Mother        IBS  . Hypertension Mother   . Heart disease Father   . Diverticulosis Father   . Migraines Father   . Other Son        Ulcerative Colitis, Polonidal cyst  . Parkinson's disease Maternal Grandmother   . Cancer Maternal Grandmother   . Mental illness Paternal Grandmother   . Stroke Paternal Grandmother   . Cancer Paternal Grandmother        brain cancer  . Bipolar disorder Paternal Grandmother   . Stroke Maternal Grandfather   . Cancer Maternal Grandfather        Bladder, Prostate/bladder/prostate  .  Heart disease Paternal Grandfather   . Heart attack Paternal Grandfather   . Macular degeneration Unknown   . Diabetes Brother   . Breast cancer Maternal Aunt    Social Hx:   Social History   Tobacco Use  . Smoking status: Current Some Day Smoker    Types: Cigarettes    Last attempt to quit: 06/12/2015    Years since quitting: 2.6  . Smokeless tobacco: Never Used  . Tobacco comment: 1-2 cigarettes some days  Substance Use Topics  . Alcohol use: No    Alcohol/week: 0.0 standard drinks  . Drug use: No   Medication:    Current Outpatient Medications:  .  b complex vitamins tablet, Take 1 tablet by mouth daily., Disp: , Rfl:  .  buPROPion (WELLBUTRIN SR) 150 MG 12 hr tablet, TAKE 1 TABLET BY MOUTH EVERY MORNING FOR 3 DAYS, THEN 1 TABLET TWICE DAILY THEREAFTER, Disp: 180 tablet, Rfl: 1 .  calcium-vitamin D (OSCAL WITH D) 500-200 MG-UNIT tablet, Take 1 tablet by mouth 3 (three) times daily., Disp: 90 tablet, Rfl: 12 .  diphenhydrAMINE (BENADRYL) 25 mg capsule, Take 2 capsules (50 mg total) by mouth every 6 (six) hours as needed. (Patient taking differently: Take 50 mg by mouth every 6 (six) hours as needed (for itching/allergies.). ), Disp: 120 capsule, Rfl: 12 .  DULoxetine (CYMBALTA) 60 MG capsule,  Take 1 capsule (60 mg total) by mouth daily., Disp: 90 capsule, Rfl: 1 .  DULoxetine (CYMBALTA) 60 MG capsule, TAKE 1 CAPSULE(60 MG) BY MOUTH DAILY, Disp: 30 capsule, Rfl: 0 .  Elastic Bandages & Supports (T.E.D. THIGH LENGTH/L-REGULAR) MISC, 2 application by Does not apply route every morning., Disp: 4 each, Rfl: 0 .  ELIQUIS 5 MG TABS tablet, TAKE 1 TABLET(5 MG) BY MOUTH TWICE DAILY, Disp: 180 tablet, Rfl: 0 .  enoxaparin (LOVENOX) 40 MG/0.4ML injection, Inject 0.4 mLs (40 mg total) into the skin daily., Disp: 4 mL, Rfl: 0 .  EPINEPHRINE 0.3 mg/0.3 mL IJ SOAJ injection, INJECT 0.3 ML( 0.3 MG) INTO THE MUSCLE ONCE, Disp: 2 Device, Rfl: 12 .  furosemide (LASIX) 20 MG tablet, TAKE 1 TABLET(20 MG) BY MOUTH DAILY, Disp: 90 tablet, Rfl: 1 .  Insulin Pen Needle 31G X 6 MM MISC, 1 each by Does not apply route daily., Disp: 100 each, Rfl: 12 .  linaclotide (LINZESS) 290 MCG CAPS capsule, Take 1 capsule (290 mcg total) by mouth daily before breakfast., Disp: 30 capsule, Rfl: 6 .  Liraglutide -Weight Management (SAXENDA) 18 MG/3ML SOPN, Inject 0.6 mg into the skin daily for 7 days, THEN 1.2 mg daily for 7 days, THEN 1.8 mg daily for 7 days, THEN 2.4 mg daily for 7 days, THEN 3 mg daily., Disp: 5 pen, Rfl: 3 .  Magnesium 250 MG TABS, Take 1,000 mg by mouth daily., Disp: , Rfl:  .  methazolamide (NEPTAZANE) 25 MG tablet, Take 1 tablet (25 mg total) by mouth 3 (three) times daily., Disp: 90 tablet, Rfl: 0 .  omeprazole (PRILOSEC) 20 MG capsule, Take 1 capsule (20 mg total) by mouth at bedtime., Disp: 90 capsule, Rfl: 3 .  pregabalin (LYRICA) 75 MG capsule, Take 75 mg by mouth at bedtime., Disp: , Rfl:  .  PROAIR HFA 108 (90 Base) MCG/ACT inhaler, INHALE 2 PUFFS BY MOUTH EVERY 4 HOURS AS NEEDED FOR WHEEZING, Disp: 17 g, Rfl: 0 .  traMADol (ULTRAM) 50 MG tablet, Take 1 tablet (50 mg total) by mouth 2 (two) times daily as  needed., Disp: 56 tablet, Rfl: 0 .  traZODone (DESYREL) 100 MG tablet, TAKE 1 TABLET(100 MG)  BY MOUTH AT BEDTIME AS NEEDED FOR SLEEP, Disp: 90 tablet, Rfl: 1 .  verapamil (CALAN-SR) 240 MG CR tablet, TAKE 1 TABLET(240 MG) BY MOUTH DAILY, Disp: 90 tablet, Rfl: 0 No current facility-administered medications for this visit.   Facility-Administered Medications Ordered in Other Visits:  .  gadopentetate dimeglumine (MAGNEVIST) injection 20 mL, 20 mL, Intravenous, Once PRN, Penumalli, Earlean Polka, MD   Allergies:  Rocephin [ceftriaxone]; Statins; Sulfa antibiotics; Valproate sodium; Compazine [prochlorperazine]; Erythromycin; Lidocaine; Meloxicam; Nitrofuran derivatives; Ace inhibitors; Biaxin [clarithromycin]; Doxycycline; Latex; Morphine and related; and Sulfamethoxazole-trimethoprim  Review of Systems: Gen:  Denies  fever, sweats, chills HEENT: Denies blurred vision, double vision. bleeds, sore throat Cvc:  No dizziness, chest pain. Resp:   Denies cough or sputum production, shortness of breath Gi: Denies swallowing difficulty, stomach pain. Gu:  Denies bladder incontinence, burning urine Ext:   No Joint pain, stiffness. Skin: No skin rash,  hives  Endoc:  No polyuria, polydipsia. Psych: No depression, insomnia. Other:  All other systems were reviewed with the patient and were negative other that what is mentioned in the HPI.   Physical Examination:   VS: BP 120/82 (BP Location: Left Arm, Cuff Size: Normal)   Pulse 83   Ht 5\' 4"  (1.626 m)   Wt 237 lb (107.5 kg)   LMP 07/01/2013 Comment: tubal  SpO2 100%   BMI 40.68 kg/m   General Appearance: No distress  Neuro:without focal findings,  speech normal,  HEENT: PERRLA, EOM intact.   Pulmonary: normal breath sounds, No wheezing.  CardiovascularNormal S1,S2.  No m/r/g.   Abdomen: Benign, Soft, non-tender. Renal:  No costovertebral tenderness  GU:  No performed at this time. Endoc: No evident thyromegaly, no signs of acromegaly. Skin:   warm, no rashes, no ecchymosis  Extremities: normal, no cyanosis, clubbing.  Other  findings:    LABORATORY PANEL:   CBC No results for input(s): WBC, HGB, HCT, PLT in the last 168 hours. ------------------------------------------------------------------------------------------------------------------  Chemistries  No results for input(s): NA, K, CL, CO2, GLUCOSE, BUN, CREATININE, CALCIUM, MG, AST, ALT, ALKPHOS, BILITOT in the last 168 hours.  Invalid input(s): GFRCGP ------------------------------------------------------------------------------------------------------------------  Cardiac Enzymes No results for input(s): TROPONINI in the last 168 hours. ------------------------------------------------------------  RADIOLOGY:  No results found.     Thank  you for the consultation and for allowing [facility] to assist in the care of your patient. Our recommendations are noted above.  Please contact us if we can be of further service.   Marda Stalker, M.D., F.C.C.P.  Board Certified in Internal Medicine, Pulmonary Medicine, [place], and Sleep Medicine.  [facility] Office Number: [phone]   01/24/2018

## 2018-01-24 NOTE — Patient Instructions (Addendum)
Will start Breo inhaler once daily, rinse mouth after use.  Will send for echocardiogram at [facility], pulmonary function test, Ct chest.   Avoid all smoking, even a small amount can make breathing worse.

## 2018-01-26 ENCOUNTER — Telehealth: Payer: Self-pay | Admitting: *Deleted

## 2018-01-26 ENCOUNTER — Ambulatory Visit (HOSPITAL_COMMUNITY): Payer: BC Managed Care – PPO | Attending: Internal Medicine

## 2018-01-26 ENCOUNTER — Ambulatory Visit (INDEPENDENT_AMBULATORY_CARE_PROVIDER_SITE_OTHER)
Admission: RE | Admit: 2018-01-26 | Discharge: 2018-01-26 | Disposition: A | Discharge status: Home / Self Care | Payer: BC Managed Care – PPO | Source: Ambulatory Visit | Attending: Internal Medicine | Admitting: Internal Medicine

## 2018-01-26 ENCOUNTER — Other Ambulatory Visit: Payer: Self-pay

## 2018-01-26 DIAGNOSIS — R0609 Other forms of dyspnea: Secondary | ICD-10-CM | POA: Insufficient documentation

## 2018-01-26 DIAGNOSIS — E785 Hyperlipidemia, unspecified: Secondary | ICD-10-CM | POA: Insufficient documentation

## 2018-01-26 DIAGNOSIS — I10 Essential (primary) hypertension: Secondary | ICD-10-CM | POA: Insufficient documentation

## 2018-01-26 DIAGNOSIS — G4733 Obstructive sleep apnea (adult) (pediatric): Secondary | ICD-10-CM | POA: Insufficient documentation

## 2018-01-26 DIAGNOSIS — Z86711 Personal history of pulmonary embolism: Secondary | ICD-10-CM | POA: Diagnosis not present

## 2018-01-26 MED ORDER — IOPAMIDOL (ISOVUE-370) INJECTION 76%
80.0000 mL | Freq: Once | INTRAVENOUS | Status: AC | PRN
  Administered 2018-01-26: 80 mL via INTRAVENOUS

## 2018-01-26 NOTE — Telephone Encounter (Signed)
[facility] [place] called and states CT Angio was negative.

## 2018-01-30 ENCOUNTER — Other Ambulatory Visit (HOSPITAL_COMMUNITY): Payer: BC Managed Care – PPO

## 2018-01-30 ENCOUNTER — Ambulatory Visit (HOSPITAL_COMMUNITY): Payer: BC Managed Care – PPO

## 2018-01-30 NOTE — Telephone Encounter (Signed)
Patient returning call for results 

## 2018-01-30 NOTE — Telephone Encounter (Signed)
Patient returning our call ° °Please call back ° °

## 2018-01-30 NOTE — Telephone Encounter (Signed)
Please let patient know, if she wasn't already informed.

## 2018-01-30 NOTE — Telephone Encounter (Signed)
LMOM for pt to return call. 

## 2018-01-30 NOTE — Telephone Encounter (Signed)
Tried to call pt. Went straight to VM. LMOM.

## 2018-01-31 NOTE — Telephone Encounter (Signed)
Pt is returning your call

## 2018-01-31 NOTE — Telephone Encounter (Signed)
Pt informed. Nothing further needed. 

## 2018-01-31 NOTE — Telephone Encounter (Signed)
LMOM for pt to return call. 

## 2018-02-06 ENCOUNTER — Encounter (INDEPENDENT_AMBULATORY_CARE_PROVIDER_SITE_OTHER): Payer: Self-pay | Admitting: Orthopaedic Surgery

## 2018-02-12 ENCOUNTER — Telehealth (INDEPENDENT_AMBULATORY_CARE_PROVIDER_SITE_OTHER): Payer: Self-pay

## 2018-02-12 NOTE — Telephone Encounter (Signed)
PA required for SynviscOne, bilateral knee. Faxed completed PA form to [employer] at [phone]. 

## 2018-02-20 ENCOUNTER — Ambulatory Visit: Payer: BC Managed Care – PPO | Attending: Internal Medicine

## 2018-02-22 ENCOUNTER — Ambulatory Visit: Payer: BC Managed Care – PPO | Admitting: Gastroenterology

## 2018-02-22 ENCOUNTER — Encounter: Payer: Self-pay | Admitting: Gastroenterology

## 2018-02-22 VITALS — BP 125/80 | HR 76 | Ht 64.0 in | Wt 238.4 lb

## 2018-02-22 DIAGNOSIS — K2270 Barrett's esophagus without dysplasia: Secondary | ICD-10-CM

## 2018-02-22 DIAGNOSIS — K581 Irritable bowel syndrome with constipation: Secondary | ICD-10-CM | POA: Diagnosis not present

## 2018-02-22 NOTE — Progress Notes (Signed)
[name] MD, MRCP(U.K) [address]  [place]  [place], [place] [address]  Main: [phone]  Fax: [phone]   Primary Care Physician: Valerie Roys, DO  Primary Gastroenterologist:  Dr. [name]   No chief complaint on file.   HPI: Shirley Rogers is a 52 y.o. female   Summary of history :  She was initially referred and seen on 12/21/2017.  For abdominal pain.  She presented to the ER on 12/06/2017 with abdominal pain which had began earlier that day associated with nausea and vomiting.  At that point of time she was on Eliquis.  In addition she was taking oxycodone.  She was given Carafate and a GI cocktail which resolved her symptoms and she was subsequently discharged.  She has been seen by [facility] back in 2016 for abdominal pain.  At that point of time she has a history of constipation/diarrhea/intentional weight loss.  She also had hard stools.  An MRCP demonstrated a mildly dilated common hepatic duct narrowing distally in the common hepatic duct attributed to crossing of the hepatic artery.  CBD was 7 mm in diameter and slightly prominent but no filling defects noted.  Sludge was noted in the gallbladder.   When she came to my office she stated that the abdominal pain that began 2 months prior was getting worse and waking her up from her sleep.  It was in the epigastric area and lower abdomen, localized, like a knife, worse by eating associated with nausea.  No relieving factors.  She was on NSAIDs which she took occasionally.  She had been taking Prilosec at night.  She is status post cholecystectomy.  She was having bowel movements once a week.  She had been on tramadol.  My impression was that her abdominal pain was likely due to a combination of IBS constipation, reflux, ulcers.  She was commenced on Linzess to 90 mcg.  Interval history   12/21/2017 to 02/22/2018  01/08/2018: EGD: 3 cm hiatal hernia noted, 2 cm segment of salmon-colored mucosa biopsies taken  which showed Barrett's esophagus with no dysplasia.  Gastric biopsy demonstrated mild reactive gastropathy.  She also had a colonoscopy performed in the same day which demonstrated few polyps which were resected, 2 of which were tubular adenomas.  Occasional abdominal pain . Feels certain foods cause her the pain. Feels much better. She has a bowel movement daily - consistency "norma;"  Taking PPI in the morning    Current Outpatient Medications  Medication Sig Dispense Refill  . b complex vitamins tablet Take 1 tablet by mouth daily.    Marland Kitchen buPROPion (WELLBUTRIN SR) 150 MG 12 hr tablet TAKE 1 TABLET BY MOUTH EVERY MORNING FOR 3 DAYS, THEN 1 TABLET TWICE DAILY THEREAFTER 180 tablet 1  . calcium-vitamin D (OSCAL WITH D) 500-200 MG-UNIT tablet Take 1 tablet by mouth 3 (three) times daily. 90 tablet 12  . diphenhydrAMINE (BENADRYL) 25 mg capsule Take 2 capsules (50 mg total) by mouth every 6 (six) hours as needed. (Patient taking differently: Take 50 mg by mouth every 6 (six) hours as needed (for itching/allergies.). ) 120 capsule 12  . DULoxetine (CYMBALTA) 60 MG capsule Take 1 capsule (60 mg total) by mouth daily. 90 capsule 1  . DULoxetine (CYMBALTA) 60 MG capsule TAKE 1 CAPSULE(60 MG) BY MOUTH DAILY 30 capsule 0  . Elastic Bandages & Supports (T.E.D. THIGH LENGTH/L-REGULAR) MISC 2 application by Does not apply route every morning. 4 each 0  .  ELIQUIS 5 MG TABS tablet TAKE 1 TABLET(5 MG) BY MOUTH TWICE DAILY 180 tablet 0  . enoxaparin (LOVENOX) 40 MG/0.4ML injection Inject 0.4 mLs (40 mg total) into the skin daily. 4 mL 0  . EPINEPHRINE 0.3 mg/0.3 mL IJ SOAJ injection INJECT 0.3 ML( 0.3 MG) INTO THE MUSCLE ONCE 2 Device 12  . fluticasone furoate-vilanterol (BREO ELLIPTA) 200-25 MCG/INH AEPB Inhale 1 puff into the lungs daily. 1 each 3  . furosemide (LASIX) 20 MG tablet TAKE 1 TABLET(20 MG) BY MOUTH DAILY 90 tablet 1  . Insulin Pen Needle 31G X 6 MM MISC 1 each by Does not apply route daily. 100  each 12  . linaclotide (LINZESS) 290 MCG CAPS capsule Take 1 capsule (290 mcg total) by mouth daily before breakfast. 30 capsule 6  . Liraglutide -Weight Management (SAXENDA) 18 MG/3ML SOPN Inject 0.6 mg into the skin daily for 7 days, THEN 1.2 mg daily for 7 days, THEN 1.8 mg daily for 7 days, THEN 2.4 mg daily for 7 days, THEN 3 mg daily. 5 pen 3  . Magnesium 250 MG TABS Take 1,000 mg by mouth daily.    . methazolamide (NEPTAZANE) 25 MG tablet Take 1 tablet (25 mg total) by mouth 3 (three) times daily. 90 tablet 0  . omeprazole (PRILOSEC) 20 MG capsule Take 1 capsule (20 mg total) by mouth at bedtime. 90 capsule 3  . pregabalin (LYRICA) 75 MG capsule Take 75 mg by mouth at bedtime.    Marland Kitchen PROAIR HFA 108 (90 Base) MCG/ACT inhaler INHALE 2 PUFFS BY MOUTH EVERY 4 HOURS AS NEEDED FOR WHEEZING 17 g 0  . traMADol (ULTRAM) 50 MG tablet Take 1 tablet (50 mg total) by mouth 2 (two) times daily as needed. 56 tablet 0  . traZODone (DESYREL) 100 MG tablet TAKE 1 TABLET(100 MG) BY MOUTH AT BEDTIME AS NEEDED FOR SLEEP 90 tablet 1  . verapamil (CALAN-SR) 240 MG CR tablet TAKE 1 TABLET(240 MG) BY MOUTH DAILY 90 tablet 0   No current facility-administered medications for this visit.    Facility-Administered Medications Ordered in Other Visits  Medication Dose Route Frequency Provider Last Rate Last Dose  . gadopentetate dimeglumine (MAGNEVIST) injection 20 mL  20 mL Intravenous Once PRN Penumalli, Earlean Polka, MD        Allergies as of 02/22/2018 - Review Complete 01/26/2018  Allergen Reaction Noted  . Rocephin [ceftriaxone] Anaphylaxis 05/08/2014  . Statins Anaphylaxis, Diarrhea, and Nausea And Vomiting 12/03/2015  . Sulfa antibiotics Diarrhea, Nausea And Vomiting, Rash, and Anaphylaxis 09/28/2001  . Valproate sodium  02/22/2009  . Compazine [prochlorperazine] Other (See Comments) 03/31/2015  . Erythromycin Itching, Nausea And Vomiting, and Rash 03/31/2015  . Lidocaine Swelling 09/28/2001  . Meloxicam   12/03/2015  . Nitrofuran derivatives  09/28/2001  . Ace inhibitors Itching 03/31/2015  . Biaxin [clarithromycin] Itching, Nausea And Vomiting, and Rash 05/08/2014  . Doxycycline Nausea And Vomiting 08/13/2015  . Latex Rash 03/31/2015  . Morphine and related Nausea And Vomiting 09/28/2001  . Sulfamethoxazole-trimethoprim Diarrhea and Nausea And Vomiting 05/04/2015    ROS:  General: Negative for anorexia, weight loss, fever, chills, fatigue, weakness. ENT: Negative for hoarseness, difficulty swallowing , nasal congestion. CV: Negative for chest pain, angina, palpitations, dyspnea on exertion, peripheral edema.  Respiratory: Negative for dyspnea at rest, dyspnea on exertion, cough, sputum, wheezing.  GI: See history of present illness. GU:  Negative for dysuria, hematuria, urinary incontinence, urinary frequency, nocturnal urination.  Endo: Negative for unusual weight change.  Physical Examination:   LMP 07/01/2013 Comment: tubal  General: Well-nourished, well-developed in no acute distress.  Eyes: No icterus. Conjunctivae pink. Mouth: Oropharyngeal mucosa moist and pink , no lesions erythema or exudate. Lungs: Clear to auscultation bilaterally. Non-labored. Heart: Regular rate and rhythm, no murmurs rubs or gallops.  Abdomen: Bowel sounds are normal, nontender, nondistended, no hepatosplenomegaly or masses, no abdominal bruits or hernia , no rebound or guarding.   Extremities: No lower extremity edema. No clubbing or deformities. Neuro: Alert and oriented x 3.  Grossly intact. Skin: Warm and dry, no jaundice.   Psych: Alert and cooperative, normal mood and affect.   Imaging Studies: Ct Angio Chest Pe W Or Wo Contrast  Result Date: 01/26/2018 CLINICAL DATA:  Six week history of increasing dyspnea with exertion, chest tightness and pressure. Patient on Eliquis. Question new pulmonary embolus. EXAM: CT ANGIOGRAPHY CHEST WITH CONTRAST TECHNIQUE: Multidetector CT imaging of the  chest was performed using the standard protocol during bolus administration of intravenous contrast. Multiplanar CT image reconstructions and MIPs were obtained to evaluate the vascular anatomy. CONTRAST:  35mL ISOVUE-370 IOPAMIDOL (ISOVUE-370) INJECTION 76% COMPARISON:  02/15/2017 FINDINGS: Cardiovascular: The study is of quality for the evaluation of pulmonary embolism. There are no filling defects in the central, lobar, segmental or subsegmental pulmonary artery branches to suggest acute pulmonary embolism. Great vessels are normal in course and caliber. Normal heart size. No significant pericardial fluid/thickening. Mediastinum/Nodes: No discrete thyroid nodules. Unremarkable esophagus. No pathologically enlarged axillary, mediastinal or hilar lymph nodes. Nonaneurysmal thoracic aorta without dissection. Lungs/Pleura: No pneumothorax. No pleural effusion. No acute pulmonary consolidation. No dominant mass. Upper abdomen: Small hiatal hernia. Musculoskeletal:  No aggressive appearing focal osseous lesions. Review of the MIP images confirms the above findings. IMPRESSION: 1. No acute pulmonary embolus. 2. No active pulmonary disease. Electronically Signed   By: Ashley Royalty M.D.   On: 01/26/2018 15:37    Assessment and Plan:   Shirley Rogers is a 52 y.o. y/o female here to follow up for  IBS-C, GERD and barrets esophagus. Doing well on Linzess .   Plan 1.  High-fiber diet  2.  Decrease linzess to 145 mcg due to diarrhea.  3.  EGD every 3 years for barrettes with no dysplasia,  , colonoscopy every 5 years for colon polyps surveillance.  4. Continue long term PPI, counseled on life style changes for GERD.    Dr [name]  MD,MRCP [facility]) Follow up PRN

## 2018-02-26 NOTE — Progress Notes (Deleted)
[facility] Consultation      Assessment and Plan:  Dyspnea on exertion, uncertain etiology, in the setting of history of pulmonary embolism, multiple comorbidities. Physical deconditioning, morbid obesity (BMI 40).   Obstructive sleep apnea, currently on CPAP.   Nicotine abuse.  - Patient was ambulated in the office today without evidence of oxygen desaturation, minimal dyspnea no patient noted sensation of "can't get air in" - Discussed that some of symptoms may be secondary to deconditioning, obesity as well as her underlying comorbid conditions.  However we will look into other conditions to make sure that these are not contributory, in particular want to make sure that she does not have a recurrent blood clot or pulmonary hypertension. - I am therefore sending the patient for a echocardiogram as well as a CT angios, and pulmonary function test. -I have started the patient on Breo inhaler empirically to see if this helps with her breathing. - I have encouraged her to try to increase her activity level as tolerated.   Date: 02/26/2018  MRN# [id number] Shirley Rogers 1965/09/26  Referring Physician: Dr. Wynetta Emery for dyspnea.   Shirley Rogers is a 52 y.o. old female seen in consultation for chief complaint of:    No chief complaint on file.   HPI:  The patient is a 52 year old female with a history of Bechets, OSA, CVA, Barrett's esophagus, morbid obesity, history of pulmonary embolism.  Last visit she was seen with complaints of dyspnea on mild exertion.  Given her history of pulmonary rhythm, she was sent for repeat CT chest which was negative.  She was started empirically on Breo.  She was encouraged in weight loss and advancing her exercise level.  She was sent for a pulmonary function test.    The patient is a 53 yo female, her history includes lupus, Behcett's, OSA, CVA, barretts esophagus, morbid obesity, PE.  Patient presents with symptoms of dyspnea  on exertion, patient notes difficulty as having trouble catching her breath or taking a full breath after walking a small distance or performing regular household chores.  She notes these chores can include washing dishes, folding laundry or drying her hair.  The symptoms have been present for at least 6 months.  Symptoms are better with slowing down, worse with activity, raising arms, standing up from a sitting or laying position. She is a current smoker of 1 to 2 cigarettes/day  The symptoms are variable and can come on even while sitting. She had a PE 2 years ago, she remains on eliquis.  She has reflux and is on prilosec.  Denies sinus drainage, she has OSA and is using CPAP nightly.  No respiratory diagnosis, but she was tried on an inhaler, she does not use it, she tries it but it does not help.  Currently she had a sensation that someone is pushing on her chest. She has been feeling more stressed recently.   **CT chest 01/26/2018>> lungs are unremarkable.  Reduced volumes due to obesity. **Desat walk 01/24/18>> At rest on RA her sat was 98% and HR 87, Patient walked a total of 360 feet at brisk pace without visible dyspnea, sat was 98% and Hr 90. Pt felt "can't get air in" **CXR 12/06/17>> Images personally reviewed; lung are unremarkable.  **Echocardiogram 06/11/2016>> EF equals 55% **CPAP titration 06/06/2016>>1. o Obstructive Sleep Apnea, responding to CPAP at 10 cm water.  2. This time no treatment emergent central apnea was seen. The patient was briefly tried  on BiPAP, but had only some benefit under BiPAP ST 12/8 and 10 /min. .  3. REM sleep vocalizations noted, movements, possible REM BD. No PLMs.  Medication:    Current Outpatient Medications:  .  b complex vitamins tablet, Take 1 tablet by mouth daily., Disp: , Rfl:  .  buPROPion (WELLBUTRIN SR) 150 MG 12 hr tablet, TAKE 1 TABLET BY MOUTH EVERY MORNING FOR 3 DAYS, THEN 1 TABLET TWICE DAILY THEREAFTER, Disp: 180 tablet, Rfl: 1 .   calcium-vitamin D (OSCAL WITH D) 500-200 MG-UNIT tablet, Take 1 tablet by mouth 3 (three) times daily. (Patient not taking: Reported on 02/22/2018), Disp: 90 tablet, Rfl: 12 .  diphenhydrAMINE (BENADRYL) 25 mg capsule, Take 2 capsules (50 mg total) by mouth every 6 (six) hours as needed. (Patient taking differently: Take 50 mg by mouth every 6 (six) hours as needed (for itching/allergies.). ), Disp: 120 capsule, Rfl: 12 .  DULoxetine (CYMBALTA) 60 MG capsule, Take 1 capsule (60 mg total) by mouth daily., Disp: 90 capsule, Rfl: 1 .  DULoxetine (CYMBALTA) 60 MG capsule, TAKE 1 CAPSULE(60 MG) BY MOUTH DAILY, Disp: 30 capsule, Rfl: 0 .  Elastic Bandages & Supports (T.E.D. THIGH LENGTH/L-REGULAR) MISC, 2 application by Does not apply route every morning., Disp: 4 each, Rfl: 0 .  ELIQUIS 5 MG TABS tablet, TAKE 1 TABLET(5 MG) BY MOUTH TWICE DAILY, Disp: 180 tablet, Rfl: 0 .  enoxaparin (LOVENOX) 40 MG/0.4ML injection, Inject 0.4 mLs (40 mg total) into the skin daily. (Patient not taking: Reported on 02/22/2018), Disp: 4 mL, Rfl: 0 .  EPINEPHRINE 0.3 mg/0.3 mL IJ SOAJ injection, INJECT 0.3 ML( 0.3 MG) INTO THE MUSCLE ONCE, Disp: 2 Device, Rfl: 12 .  fluticasone furoate-vilanterol (BREO ELLIPTA) 200-25 MCG/INH AEPB, Inhale 1 puff into the lungs daily., Disp: 1 each, Rfl: 3 .  furosemide (LASIX) 20 MG tablet, TAKE 1 TABLET(20 MG) BY MOUTH DAILY, Disp: 90 tablet, Rfl: 1 .  Insulin Pen Needle 31G X 6 MM MISC, 1 each by Does not apply route daily., Disp: 100 each, Rfl: 12 .  linaclotide (LINZESS) 290 MCG CAPS capsule, Take 1 capsule (290 mcg total) by mouth daily before breakfast., Disp: 30 capsule, Rfl: 6 .  Liraglutide -Weight Management (SAXENDA) 18 MG/3ML SOPN, Inject 0.6 mg into the skin daily for 7 days, THEN 1.2 mg daily for 7 days, THEN 1.8 mg daily for 7 days, THEN 2.4 mg daily for 7 days, THEN 3 mg daily., Disp: 5 pen, Rfl: 3 .  Magnesium 250 MG TABS, Take 1,000 mg by mouth daily., Disp: , Rfl:  .   methazolamide (NEPTAZANE) 25 MG tablet, Take 1 tablet (25 mg total) by mouth 3 (three) times daily., Disp: 90 tablet, Rfl: 0 .  omeprazole (PRILOSEC) 20 MG capsule, Take 1 capsule (20 mg total) by mouth at bedtime., Disp: 90 capsule, Rfl: 3 .  pregabalin (LYRICA) 75 MG capsule, Take 75 mg by mouth at bedtime., Disp: , Rfl:  .  PROAIR HFA 108 (90 Base) MCG/ACT inhaler, INHALE 2 PUFFS BY MOUTH EVERY 4 HOURS AS NEEDED FOR WHEEZING (Patient not taking: Reported on 02/22/2018), Disp: 17 g, Rfl: 0 .  traMADol (ULTRAM) 50 MG tablet, Take 1 tablet (50 mg total) by mouth 2 (two) times daily as needed., Disp: 56 tablet, Rfl: 0 .  traZODone (DESYREL) 100 MG tablet, TAKE 1 TABLET(100 MG) BY MOUTH AT BEDTIME AS NEEDED FOR SLEEP, Disp: 90 tablet, Rfl: 1 .  verapamil (CALAN-SR) 240 MG CR tablet, TAKE  1 TABLET(240 MG) BY MOUTH DAILY, Disp: 90 tablet, Rfl: 0 No current facility-administered medications for this visit.   Facility-Administered Medications Ordered in Other Visits:  .  gadopentetate dimeglumine (MAGNEVIST) injection 20 mL, 20 mL, Intravenous, Once PRN, Penumalli, Earlean Polka, MD   Allergies:  Rocephin [ceftriaxone]; Statins; Sulfa antibiotics; Valproate sodium; Compazine [prochlorperazine]; Erythromycin; Lidocaine; Meloxicam; Nitrofuran derivatives; Ace inhibitors; Biaxin [clarithromycin]; Doxycycline; Latex; Morphine and related; and Sulfamethoxazole-trimethoprim      LABORATORY PANEL:   CBC No results for input(s): WBC, HGB, HCT, PLT in the last 168 hours. ------------------------------------------------------------------------------------------------------------------  Chemistries  No results for input(s): NA, K, CL, CO2, GLUCOSE, BUN, CREATININE, CALCIUM, MG, AST, ALT, ALKPHOS, BILITOT in the last 168 hours.  Invalid input(s): GFRCGP ------------------------------------------------------------------------------------------------------------------  Cardiac Enzymes No results for input(s):  TROPONINI in the last 168 hours. ------------------------------------------------------------  RADIOLOGY:  No results found.     Thank  you for the consultation and for allowing [facility] to assist in the care of your patient. Our recommendations are noted above.  Please contact us if we can be of further service.   Marda Stalker, M.D., F.C.C.P.  Board Certified in Internal Medicine, Pulmonary Medicine, [place], and Sleep Medicine.  [facility] Office Number: [phone]   02/26/2018

## 2018-02-27 ENCOUNTER — Ambulatory Visit: Payer: BC Managed Care – PPO | Admitting: Internal Medicine

## 2018-02-27 ENCOUNTER — Telehealth (INDEPENDENT_AMBULATORY_CARE_PROVIDER_SITE_OTHER): Payer: Self-pay

## 2018-02-27 NOTE — Telephone Encounter (Signed)
Called and left a VM for patient to CB and schedule an appointment to have gel injections with Dr. Erlinda Hong.  Patient is approved for SynviscOne, bilateral knee. [place] Covered at 100% of the allowed amount PA required PA Approval# [id number] Valid 02/12/2018- 02/12/2019

## 2018-02-28 ENCOUNTER — Encounter: Payer: Self-pay | Admitting: Internal Medicine

## 2018-02-28 ENCOUNTER — Telehealth (INDEPENDENT_AMBULATORY_CARE_PROVIDER_SITE_OTHER): Payer: Self-pay | Admitting: Orthopaedic Surgery

## 2018-02-28 NOTE — Telephone Encounter (Signed)
Message sent in error

## 2018-02-28 NOTE — Telephone Encounter (Signed)
Patient's appointment has been scheduled for Tuesday, October 8th @ 4:00 pm Dr. Erlinda Hong.

## 2018-02-28 NOTE — Telephone Encounter (Signed)
Noted! Thank you

## 2018-03-03 ENCOUNTER — Other Ambulatory Visit: Payer: Self-pay | Admitting: Family Medicine

## 2018-03-05 NOTE — Telephone Encounter (Signed)
Requested Prescriptions  Pending Prescriptions Disp Refills  . DULoxetine (CYMBALTA) 60 MG capsule [Pharmacy Med Name: DULOXETINE DR 60MG  CAPSULES] 30 capsule 0    Sig: TAKE 1 CAPSULE(60 MG) BY MOUTH DAILY     Psychiatry: Antidepressants - SNRI Passed - 03/03/2018 11:36 AM      Passed - Completed PHQ-2 or PHQ-9 in the last 360 days.      Passed - Last BP in normal range    BP Readings from Last 1 Encounters:  02/22/18 125/80         Passed - Valid encounter within last 6 months    Recent Outpatient Visits          2 months ago Routine general medical examination at a health care facility   [facility], [place] P, DO   3 months ago Hemiplegic migraine without status migrainosus, not intractable   [place], [place] P, DO   9 months ago Pre-procedural general physical examination   [place], Megan P, DO   10 months ago Herpes zoster with ophthalmic complication, unspecified herpes zoster eye disease   [facility] Kathrine Haddock, NP   11 months ago Chronic pain of left knee   [facility] [place], [place], DO      Future Appointments            In 2 months Wynetta Emery, Barb Merino, DO [employer]

## 2018-03-06 ENCOUNTER — Ambulatory Visit (INDEPENDENT_AMBULATORY_CARE_PROVIDER_SITE_OTHER): Payer: BC Managed Care – PPO | Admitting: Orthopaedic Surgery

## 2018-03-06 ENCOUNTER — Encounter (INDEPENDENT_AMBULATORY_CARE_PROVIDER_SITE_OTHER): Payer: Self-pay | Admitting: Orthopaedic Surgery

## 2018-03-06 DIAGNOSIS — M1711 Unilateral primary osteoarthritis, right knee: Secondary | ICD-10-CM

## 2018-03-06 DIAGNOSIS — M1712 Unilateral primary osteoarthritis, left knee: Secondary | ICD-10-CM | POA: Diagnosis not present

## 2018-03-06 MED ORDER — HYLAN G-F 20 48 MG/6ML IX SOSY
48.0000 mg | PREFILLED_SYRINGE | INTRA_ARTICULAR | Status: AC | PRN
  Administered 2018-03-06: 48 mg via INTRA_ARTICULAR

## 2018-03-06 NOTE — Progress Notes (Signed)
   Procedure Note  Patient: Shirley Rogers             Date of Birth: [birth year]           MRN: [id number]             Visit Date: 03/06/2018  Procedures: Visit Diagnoses: Unilateral primary osteoarthritis, left knee  Unilateral primary osteoarthritis, right knee  Large Joint Inj: bilateral knee on 03/06/2018 4:23 PM Indications: pain Details: 22 G needle  Arthrogram: No  Medications (Right): 48 mg Hylan 48 MG/6ML Medications (Left): 48 mg Hylan 48 MG/6ML Outcome: tolerated well, no immediate complications Patient was prepped and draped in the usual sterile fashion.

## 2018-03-19 ENCOUNTER — Telehealth: Payer: Self-pay

## 2018-03-19 NOTE — Telephone Encounter (Signed)
PA initiated via Cover My Meds for Saxenda. Key: [id number]

## 2018-03-22 NOTE — Telephone Encounter (Signed)
Kirke Shaggy was approved 03/19/18-03/20/19

## 2018-03-23 ENCOUNTER — Emergency Department: Payer: BC Managed Care – PPO

## 2018-03-23 ENCOUNTER — Emergency Department
Admission: EM | Admit: 2018-03-23 | Discharge: 2018-03-23 | Disposition: A | Discharge status: Home / Self Care | Payer: BC Managed Care – PPO | Attending: Emergency Medicine | Admitting: Emergency Medicine

## 2018-03-23 ENCOUNTER — Other Ambulatory Visit: Payer: Self-pay

## 2018-03-23 DIAGNOSIS — Z7901 Long term (current) use of anticoagulants: Secondary | ICD-10-CM | POA: Insufficient documentation

## 2018-03-23 DIAGNOSIS — I10 Essential (primary) hypertension: Secondary | ICD-10-CM | POA: Insufficient documentation

## 2018-03-23 DIAGNOSIS — Z79899 Other long term (current) drug therapy: Secondary | ICD-10-CM | POA: Insufficient documentation

## 2018-03-23 DIAGNOSIS — Z91040 Latex allergy status: Secondary | ICD-10-CM | POA: Insufficient documentation

## 2018-03-23 DIAGNOSIS — Z8673 Personal history of transient ischemic attack (TIA), and cerebral infarction without residual deficits: Secondary | ICD-10-CM | POA: Insufficient documentation

## 2018-03-23 DIAGNOSIS — F17210 Nicotine dependence, cigarettes, uncomplicated: Secondary | ICD-10-CM | POA: Diagnosis not present

## 2018-03-23 DIAGNOSIS — G43409 Hemiplegic migraine, not intractable, without status migrainosus: Secondary | ICD-10-CM

## 2018-03-23 DIAGNOSIS — R29810 Facial weakness: Secondary | ICD-10-CM | POA: Diagnosis present

## 2018-03-23 LAB — COMPREHENSIVE METABOLIC PANEL
ALT: 14 U/L (ref 0–44)
AST: 19 U/L (ref 15–41)
Albumin: 4.1 g/dL (ref 3.5–5.0)
Alkaline Phosphatase: 51 U/L (ref 38–126)
Anion gap: 11 (ref 5–15)
BILIRUBIN TOTAL: 0.7 mg/dL (ref 0.3–1.2)
BUN: 18 mg/dL (ref 6–20)
CALCIUM: 8.9 mg/dL (ref 8.9–10.3)
CO2: 25 mmol/L (ref 22–32)
CREATININE: 0.79 mg/dL (ref 0.44–1.00)
Chloride: 105 mmol/L (ref 98–111)
GFR calc non Af Amer: >60 mL/min (ref >60)
GLUCOSE: 85 mg/dL (ref 70–99)
POTASSIUM: 3.7 mmol/L (ref 3.5–5.1)
Sodium: 141 mmol/L (ref 135–145)
Total Protein: 6.9 g/dL (ref 6.5–8.1)

## 2018-03-23 LAB — DIFFERENTIAL
Abs Immature Granulocytes: 0.02 10*3/uL (ref 0.00–0.07)
BASOS ABS: 0 10*3/uL (ref 0.0–0.1)
Basophils Relative: 0 %
EOS ABS: 0.1 10*3/uL (ref 0.0–0.5)
EOS PCT: 2 %
Immature Granulocytes: 0 %
Lymphocytes Relative: 32 %
Lymphs Abs: 1.7 10*3/uL (ref 0.7–4.0)
MONO ABS: 0.5 10*3/uL (ref 0.1–1.0)
Monocytes Relative: 10 %
Neutro Abs: 2.9 10*3/uL (ref 1.7–7.7)
Neutrophils Relative %: 56 %

## 2018-03-23 LAB — TROPONIN I: Troponin I: <0.03 ng/mL (ref <0.03)

## 2018-03-23 LAB — CBC
HCT: 40.8 % (ref 36.0–46.0)
Hemoglobin: 13.3 g/dL (ref 12.0–15.0)
MCH: 30.3 pg (ref 26.0–34.0)
MCHC: 32.6 g/dL (ref 30.0–36.0)
MCV: 92.9 fL (ref 80.0–100.0)
NRBC: 0 % (ref 0.0–0.2)
PLATELETS: 216 10*3/uL (ref 150–400)
RBC: 4.39 MIL/uL (ref 3.87–5.11)
RDW: 12.1 % (ref 11.5–15.5)
WBC: 5.3 10*3/uL (ref 4.0–10.5)

## 2018-03-23 LAB — PROTIME-INR
INR: 0.92
PROTHROMBIN TIME: 12.3 s (ref 11.4–15.2)

## 2018-03-23 LAB — GLUCOSE, CAPILLARY: Glucose-Capillary: 101 mg/dL — ABNORMAL HIGH (ref 70–99)

## 2018-03-23 LAB — APTT: aPTT: 31 seconds (ref 24–36)

## 2018-03-23 MED ORDER — SODIUM CHLORIDE 0.9 % IV SOLN
1000.0000 mL | Freq: Once | INTRAVENOUS | Status: AC
  Administered 2018-03-23: 1000 mL via INTRAVENOUS

## 2018-03-23 MED ORDER — DIPHENHYDRAMINE HCL 50 MG/ML IJ SOLN
25.0000 mg | Freq: Once | INTRAMUSCULAR | Status: AC
  Administered 2018-03-23: 25 mg via INTRAVENOUS
  Filled 2018-03-23: qty 1

## 2018-03-23 MED ORDER — METOCLOPRAMIDE HCL 5 MG/ML IJ SOLN
20.0000 mg | Freq: Once | INTRAVENOUS | Status: AC
  Administered 2018-03-23: 20 mg via INTRAVENOUS
  Filled 2018-03-23: qty 4

## 2018-03-23 MED ORDER — KETOROLAC TROMETHAMINE 30 MG/ML IJ SOLN
30.0000 mg | Freq: Once | INTRAMUSCULAR | Status: AC
  Administered 2018-03-23: 30 mg via INTRAVENOUS
  Filled 2018-03-23: qty 1

## 2018-03-23 NOTE — ED Notes (Signed)
Pt walks to bathroom independently with a steady gait

## 2018-03-23 NOTE — ED Notes (Signed)
MD Kinner at bedside  

## 2018-03-23 NOTE — ED Provider Notes (Signed)
[facility] Emergency Department Provider Note   ____________________________________________    I have reviewed the triage vital signs and the nursing notes.   HISTORY  Chief Complaint Code Stroke     HPI Shirley Rogers is a 52 y.o. female who presents with left-sided facial droop, right-sided tingling, left arm weakness which started this morning.  Review of medical records demonstrates the patient has a history of hemiplegic migraines and on further review her symptoms today are the exact same as prior migraines.  She reports this is not happened in over a year since having a lumbar puncture.  She does have another lumbar puncture scheduled as an outpatient.  She is on blood thinners for a blood clot.  Denies nausea or vomiting.  No head trauma.  Initially called as a code stroke by triage nurse, I have canceled this  Past Medical History:  Diagnosis Date  . Anxiety    on xanax in the past  . ASCVD (arteriosclerotic cardiovascular disease)    carotids  . Barrett esophagus   . 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 Carotid atherosclerosis 2003   on ultrasound  . Colon polyp   . Complicated migraine    with facial drooping  . Concussion    1974,1982,1987,2016,2016  . DDD (degenerative disc disease), lumbar    MRI 2008, L3-4, L5-S1, spondylotic changes, multilevel facet joint hypertrophic changes  . Depression    on paxil, lexapro, cymbalta in the past  . Diverticulosis   . Dizziness due to old head injury   . Family history of adverse reaction to anesthesia    father - slow to wake  . Fatty liver 2013   On CT abdomen/pelvis  . Fibrocystic breast disease   . Fibromyalgia   . Fracture    left knee medial femoral condyle stress fracture, chondromalacia  . GERD (gastroesophageal reflux disease)   . Hemiplegic migraine    daily  . Hemorrhoids   . History of kidney stones   . Hyperlipidemia   . Hypertension    no meds currently.  . IBS (irritable  bowel syndrome)   . Insomnia chronic  . Kidney stone 2013   on CT abdomen/pelvis  . 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 Migraine   . Narrowing of intervertebral disc space 2011   C5-6  . Normal cardiac stress test 2009   EF 66%  . Pneumonia   . Pulmonary emboli ([place])   . Recurrent sinusitis   . Recurrent UTI    question about IC in the past  . Sleep apnea    CPAP machine broken  . Stroke ([place])    2000 - no deficits  . Umbilical hernia 2585   on CT abdomen/pelvis  . Varicose veins    right lower leg  . Vascular spasm ([place])   . Vertigo    from concussive disorder.  seeing neuro 03/23/15    Patient Active Problem List   Diagnosis Date Noted  . Chronic pain of both knees 11/17/2017  . Morbid obesity ([place]) 11/17/2017  . Body mass index 40.0-44.9, adult ([place]) 11/17/2017  . Unilateral primary osteoarthritis, right knee 07/11/2017  . S/P left knee arthroscopy 06/06/2017  . Stress fracture of femur 05/16/2017  . Chondromalacia patellae, left knee 05/16/2017  . Primary localized osteoarthritis of both knees 04/26/2017  . ASCUS with positive high risk HPV cervical 01/18/2017  . Increased  intracranial pressure 10/06/2016  . Vestibular hypofunction of left ear 09/21/2016  . Intracranial hypertension, benign 09/21/2016  . Chronic daily headache 09/21/2016  . Inflammatory arthritis 08/28/2016  . Nephrolithiasis 07/22/2016  . Dysuria 07/22/2016  . 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 Behcet's disease ([place]) 04/24/2016  . Microhematuria 03/15/2016  . Pelvic floor dysfunction 03/15/2016  . Urethral caruncle 03/15/2016  . History of pulmonary embolus (PE) 03/11/2016  . Facial droop 02/04/2016  . Right hemiparesis ([place]) 02/04/2016  . Tobacco use 12/03/2015  . ANA positive 10/19/2015  . Transient alteration of awareness 09/24/2015  . Snoring 06/10/2015  . Sleep apnea 06/10/2015  . Bile duct  abnormality 05/12/2015  . 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 Globus sensation 03/30/2015  . Post concussion syndrome 03/13/2015  . Depression 03/13/2015  . Benign hypertensive renal disease   . Barrett esophagus   . Diverticulosis   . Hemiplegic migraine   . Lupus ([place])   . History of stroke   . Fall 05/25/2014    Past Surgical History:  Procedure Laterality Date  . Biopsy Punch Thyroid    . BREAST EXCISIONAL BIOPSY Right 2000   right mass excision - atypical hyperplasia  . BREAST SURGERY Right    atypical hyperplasia  . CHOLECYSTECTOMY N/A 07/24/2015   Procedure: LAPAROSCOPIC CHOLECYSTECTOMY WITH INTRAOPERATIVE CHOLANGIOGRAM;  Surgeon: Leonie Green, MD;  Location: [facility] ORS;  Service: General;  Laterality: N/A;  . COLONOSCOPY W/ BIOPSIES     Removed 5 polyps  . COLONOSCOPY WITH PROPOFOL N/A 03/03/2017   Procedure: COLONOSCOPY WITH PROPOFOL;  Surgeon: [name], MD;  Location: [facility] ENDOSCOPY;  Service: Gastroenterology;  Laterality: N/A;  . COLONOSCOPY WITH PROPOFOL N/A 01/08/2018   Procedure: COLONOSCOPY WITH PROPOFOL;  Surgeon: [name], MD;  Location: [facility] ENDOSCOPY;  Service: Gastroenterology;  Laterality: N/A;  . ESOPHAGOGASTRODUODENOSCOPY (EGD) WITH PROPOFOL N/A 01/08/2018   Procedure: ESOPHAGOGASTRODUODENOSCOPY (EGD) WITH PROPOFOL;  Surgeon: [name], MD;  Location: [facility] ENDOSCOPY;  Service: Gastroenterology;  Laterality: N/A;  . KNEE ARTHROSCOPY WITH SUBCHONDROPLASTY Left 05/26/2017   Procedure: LEFT KNEE ARTHROSCOPY WITH SUBCHONDROPLASTY, CHONDROPLASTY, SYNOVECTOMY, POSSIBLE MICROFX;  Surgeon: Leandrew Koyanagi, MD;  Location: [facility];  Service: Orthopedics;  Laterality: Left;  . TUBAL LIGATION      Prior to Admission medications   Medication Sig Start Date End Date Taking? Authorizing Provider  b complex vitamins tablet Take 1 tablet by mouth daily.   Yes [provider]  buPROPion (WELLBUTRIN SR) 150 MG 12 hr tablet TAKE 1 TABLET BY  MOUTH EVERY MORNING FOR 3 DAYS, THEN 1 TABLET TWICE DAILY THEREAFTER Patient taking differently: Take 150 mg by mouth 2 (two) times daily.  10/08/17  Yes Johnson, Megan P, DO  diphenhydrAMINE (BENADRYL) 25 mg capsule Take 2 capsules (50 mg total) by mouth every 6 (six) hours as needed. Patient taking differently: Take 50 mg by mouth every 6 (six) hours as needed (for itching/allergies.).  08/02/16  Yes Johnson, Megan P, DO  DULoxetine (CYMBALTA) 60 MG capsule Take 1 capsule (60 mg total) by mouth daily. 06/23/17  Yes Johnson, Megan P, DO  Elastic Bandages & Supports (T.E.D. THIGH LENGTH/L-REGULAR) MISC 2 application by Does not apply route every morning. 03/18/16  Yes Merlyn Lot, MD  ELIQUIS 5 MG TABS tablet TAKE 1 TABLET(5 MG) BY MOUTH TWICE DAILY 12/19/17  Yes Johnson, Megan P, DO  EPINEPHRINE 0.3 mg/0.3 mL IJ SOAJ injection INJECT 0.3 ML( 0.3 MG) INTO THE MUSCLE ONCE Patient taking differently:  Inject 0.3 mg into the muscle as needed (anaphylaxis).  12/05/16  Yes Johnson, Megan P, DO  fluticasone furoate-vilanterol (BREO ELLIPTA) 200-25 MCG/INH AEPB Inhale 1 puff into the lungs daily. 01/24/18  Yes Laverle Hobby, MD  furosemide (LASIX) 20 MG tablet TAKE 1 TABLET(20 MG) BY MOUTH DAILY Patient taking differently: Take 20 mg by mouth daily.  09/14/17  Yes Johnson, Megan P, DO  Insulin Pen Needle 31G X 6 MM MISC 1 each by Does not apply route daily. 11/28/17  Yes Johnson, Megan P, DO  linaclotide (LINZESS) 290 MCG CAPS capsule Take 1 capsule (290 mcg total) by mouth daily before breakfast. 01/10/18  Yes [name], MD  Liraglutide -Weight Management (SAXENDA) 18 MG/3ML SOPN Inject 24 Units into the skin daily.   Yes [provider]  Magnesium 500 MG TABS Take 1,000 mg by mouth daily.    Yes [provider]  methazolamide (NEPTAZANE) 25 MG tablet Take 1 tablet (25 mg total) by mouth 3 (three) times daily. 04/26/17  Yes Volney American, PA-C  omeprazole (PRILOSEC) 20 MG  capsule Take 1 capsule (20 mg total) by mouth at bedtime. 12/22/17  Yes Johnson, Megan P, DO  pregabalin (LYRICA) 75 MG capsule Take 75 mg by mouth at bedtime.   Yes [provider]  traMADol (ULTRAM) 50 MG tablet Take 1 tablet (50 mg total) by mouth 2 (two) times daily as needed. 10/20/17  Yes Johnson, Megan P, DO  traZODone (DESYREL) 100 MG tablet TAKE 1 TABLET(100 MG) BY MOUTH AT BEDTIME AS NEEDED FOR SLEEP Patient taking differently: Take 100 mg by mouth at bedtime as needed for sleep.  10/12/17  Yes Johnson, Megan P, DO  verapamil (CALAN-SR) 240 MG CR tablet TAKE 1 TABLET(240 MG) BY MOUTH DAILY Patient taking differently: Take 240 mg by mouth daily.  01/15/18  Yes Johnson, Megan P, DO  calcium-vitamin D (OSCAL WITH D) 500-200 MG-UNIT tablet Take 1 tablet by mouth 3 (three) times daily. Patient not taking: Reported on 03/23/2018 05/26/17   Leandrew Koyanagi, MD  enoxaparin (LOVENOX) 40 MG/0.4ML injection Inject 0.4 mLs (40 mg total) into the skin daily. Patient not taking: Reported on 02/22/2018 12/22/17   Park Liter P, DO  PROAIR HFA 108 [phone] Base) MCG/ACT inhaler INHALE 2 PUFFS BY MOUTH EVERY 4 HOURS AS NEEDED FOR WHEEZING Patient not taking: Reported on 02/22/2018 09/26/16   Park Liter P, DO     Allergies Rocephin [ceftriaxone]; Statins; Sulfa antibiotics; Valproate sodium; Compazine [prochlorperazine]; Erythromycin; Lidocaine; Meloxicam; Nitrofuran derivatives; Ace inhibitors; Biaxin [clarithromycin]; Doxycycline; Latex; Morphine and related; and Sulfamethoxazole-trimethoprim  Family History  Problem Relation Age of Onset  . Other Mother        IBS  . Hypertension Mother   . Heart disease Father   . Diverticulosis Father   . Migraines Father   . Other Son        Ulcerative Colitis, Polonidal cyst  . Parkinson's disease Maternal Grandmother   . Cancer Maternal Grandmother   . Mental illness Paternal Grandmother   . Stroke Paternal Grandmother   . Cancer Paternal Grandmother         brain cancer  . Bipolar disorder Paternal Grandmother   . Stroke Maternal Grandfather   . Cancer Maternal Grandfather        Bladder, Prostate/bladder/prostate  . Heart disease Paternal Grandfather   . Heart attack Paternal Grandfather   . Macular degeneration Unknown   . Diabetes Brother   . Breast cancer Maternal Aunt  Social History Social History   Tobacco Use  . Smoking status: Current Some Day Smoker    Types: Cigarettes    Last attempt to quit: 06/12/2015    Years since quitting: 2.7  . Smokeless tobacco: Never Used  . Tobacco comment: 1-2 cigarettes some days  Substance Use Topics  . Alcohol use: No    Alcohol/week: 0.0 standard drinks  . Drug use: No    Review of Systems  Constitutional: No fever/chills Eyes: No visual changes.  ENT: No sore throat. Cardiovascular: Denies chest pain. Respiratory: Denies shortness of breath. Gastrointestinal: No nausea, no vomiting.   Genitourinary: Negative for dysuria. Musculoskeletal: Negative for back pain. Skin: Negative for rash. Neurological: As above, patient does have a mild headache   ____________________________________________   PHYSICAL EXAM:  VITAL SIGNS: ED Triage Vitals  Enc Vitals Group     BP 03/23/18 0945 (!) 146/94     Pulse Rate 03/23/18 0945 78     Resp 03/23/18 0945 16     Temp 03/23/18 0945 (!) 97.3 F (36.3 C)     Temp Source 03/23/18 0945 Oral     SpO2 03/23/18 0945 95 %     Weight --      Height --      Head Circumference --      Peak Flow --      Pain Score 03/23/18 1145 7     Pain Loc --      Pain Edu? --      Excl. in [place]? --     Constitutional: Alert and oriented.  Eyes: Conjunctivae are normal.  EOMI, PERRLA  Mouth/Throat: Mucous membranes are moist.    Cardiovascular: Normal rate, regular rhythm. Grossly normal heart sounds.  Good peripheral circulation. Respiratory: Normal respiratory effort.  No retractions. Lungs CTAB. Gastrointestinal: Soft and nontender.  No distention.  No CVA tenderness. Genitourinary: deferred Musculoskeletal: No lower extremity tenderness nor edema.  Warm and well perfused Neurologic: Left-sided facial droop, left arm weakness, otherwise no deficits Skin:  Skin is warm, dry and intact. No rash noted. Psychiatric: Mood and affect are normal. Speech and behavior are normal.  ____________________________________________   LABS (all labs ordered are listed, but only abnormal results are displayed)  Labs Reviewed  GLUCOSE, CAPILLARY - Abnormal; Notable for the following components:      Result Value   Glucose-Capillary 101 (*)    All other components within normal limits  PROTIME-INR  APTT  CBC  DIFFERENTIAL  COMPREHENSIVE METABOLIC PANEL  TROPONIN I  CBG MONITORING, ED  POC URINE PREG, ED   ____________________________________________  EKG  None ____________________________________________  RADIOLOGY  CT head unremarkable ____________________________________________   PROCEDURES  Procedure(s) performed: No  Procedures   Critical Care performed: No ____________________________________________   INITIAL IMPRESSION / ASSESSMENT AND PLAN / ED COURSE  Pertinent labs & imaging results that were available during my care of the patient were reviewed by me and considered in my medical decision making (see chart for details).  Patient CT scan is unremarkable, initially called as a code stroke however presentation is in line with prior episodes of hemiplegic migraine.  She reports typical improvement with headache cocktail.  Will treat with IV Toradol, IV Reglan, IV Benadryl, IV fluids.  Dr. Irish Elders of neurology has seen the patient and agreed with plan   ----------------------------------------- 1:43 PM on 03/23/2018 -----------------------------------------  Patient is returned to her baseline, neuro deficits have resolved, headache has resolved.  Episode consistent with hemiplegic migraine as  per her history.  Appropriate for discharge at this time     ____________________________________________   FINAL CLINICAL IMPRESSION(S) / ED DIAGNOSES  Final diagnoses:  Hemiplegic migraine without status migrainosus, not intractable        Note:  This document was prepared using Dragon voice recognition software and may include unintentional dictation errors.    Lavonia Drafts, MD 03/23/18 [id number]

## 2018-03-23 NOTE — Code Documentation (Signed)
Pt arrives with complaints of right sided weakness and numbness, code stroke activated in triage, Dr. Corky Downs cleared pt for CT at [phone], pt states hx of PE and hemiplegic migraines, pt on eliquis with last dose 10/24, initial NIHSS 4, no tPA due to pt on eliquis, migraine cocktail ordered for pt, Dr. Corky Downs and Benjamine Mola Neuro NP at bedside, report off to [facility]

## 2018-03-23 NOTE — Progress Notes (Signed)
CODE STROKE- PHARMACY COMMUNICATION   Time CODE STROKE called/page received: ~[phone]  Time response to CODE STROKE was made (in person or via phone): 1000  Time Stroke Kit retrieved from [facility] (only if needed): N/A (pt on apixaban)  Name of Provider/Nurse contacted: Jana Half, RN  Past Medical History:  Diagnosis Date  . Anxiety    on xanax in the past  . ASCVD (arteriosclerotic cardiovascular disease)    carotids  . Barrett esophagus   . 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 Carotid atherosclerosis 2003   on ultrasound  . Colon polyp   . Complicated migraine    with facial drooping  . Concussion    1974,1982,1987,2016,2016  . DDD (degenerative disc disease), lumbar    MRI 2008, L3-4, L5-S1, spondylotic changes, multilevel facet joint hypertrophic changes  . Depression    on paxil, lexapro, cymbalta in the past  . Diverticulosis   . Dizziness due to old head injury   . Family history of adverse reaction to anesthesia    father - slow to wake  . Fatty liver 2013   On CT abdomen/pelvis  . Fibrocystic breast disease   . Fibromyalgia   . Fracture    left knee medial femoral condyle stress fracture, chondromalacia  . GERD (gastroesophageal reflux disease)   . Hemiplegic migraine    daily  . Hemorrhoids   . History of kidney stones   . Hyperlipidemia   . Hypertension    no meds currently.  . IBS (irritable bowel syndrome)   . Insomnia chronic  . Kidney stone 2013   on CT abdomen/pelvis  . Lupus ([place]) 1990   multiple plaques on MRI consistent with lupus vasculitis in 2003, question Behcet's   . Migraine   . Narrowing of intervertebral disc space 2011   C5-6  . Normal cardiac stress test 2009   EF 66%  . Pneumonia   . Pulmonary emboli ([place])   . Recurrent sinusitis   . Recurrent UTI    question about IC in the past  . Sleep apnea    CPAP machine broken  . Stroke ([place])    2000 - no deficits  . Umbilical hernia 2409   on CT abdomen/pelvis  . Varicose veins    right lower leg  . Vascular spasm ([place])   . Vertigo    from concussive disorder.  seeing neuro 03/23/15   Prior to Admission medications   Medication Sig Start Date End Date Taking? Authorizing Provider  b complex vitamins tablet Take 1 tablet by mouth daily.   Yes [provider]  buPROPion (WELLBUTRIN SR) 150 MG 12 hr tablet TAKE 1 TABLET BY MOUTH EVERY MORNING FOR 3 DAYS, THEN 1 TABLET TWICE DAILY THEREAFTER 10/08/17  Yes Johnson, Megan P, DO  DULoxetine (CYMBALTA) 60 MG capsule TAKE 1 CAPSULE(60 MG) BY MOUTH DAILY 03/05/18  Yes Johnson, Megan P, DO  ELIQUIS 5 MG TABS tablet TAKE 1 TABLET(5 MG) BY MOUTH TWICE DAILY 12/19/17  Yes Johnson, Megan P, DO  fluticasone furoate-vilanterol (BREO ELLIPTA) 200-25 MCG/INH AEPB Inhale 1 puff into the lungs daily. 01/24/18  Yes Laverle Hobby, MD  furosemide (LASIX) 20 MG tablet TAKE 1 TABLET(20 MG) BY MOUTH DAILY 09/14/17  Yes Johnson, Megan P, DO  linaclotide (LINZESS) 290 MCG CAPS capsule Take 1 capsule (290 mcg total) by mouth daily before breakfast. 01/10/18  Yes [name], MD  Liraglutide -Weight Management (SAXENDA) 18 MG/3ML SOPN Inject 24 Units into the skin daily.   Yes  [provider]  Magnesium 250 MG TABS Take 1,000 mg by mouth daily.   Yes [provider]  methazolamide (NEPTAZANE) 25 MG tablet Take 1 tablet (25 mg total) by mouth 3 (three) times daily. 04/26/17  Yes Volney American, PA-C  omeprazole (PRILOSEC) 20 MG capsule Take 1 capsule (20 mg total) by mouth at bedtime. 12/22/17  Yes Johnson, Megan P, DO  pregabalin (LYRICA) 75 MG capsule Take 75 mg by mouth at bedtime.   Yes [provider]  traMADol (ULTRAM) 50 MG tablet Take 1 tablet (50 mg total) by mouth 2 (two) times daily as needed. 10/20/17  Yes Johnson, Megan P, DO  traZODone (DESYREL) 100 MG tablet TAKE 1 TABLET(100 MG) BY MOUTH AT BEDTIME AS NEEDED FOR SLEEP 10/12/17  Yes Johnson, Megan P, DO  verapamil (CALAN-SR) 240 MG CR tablet TAKE  1 TABLET(240 MG) BY MOUTH DAILY 01/15/18  Yes Johnson, Megan P, DO  calcium-vitamin D (OSCAL WITH D) 500-200 MG-UNIT tablet Take 1 tablet by mouth 3 (three) times daily. Patient not taking: Reported on 02/22/2018 05/26/17   Leandrew Koyanagi, MD  diphenhydrAMINE (BENADRYL) 25 mg capsule Take 2 capsules (50 mg total) by mouth every 6 (six) hours as needed. Patient taking differently: Take 50 mg by mouth every 6 (six) hours as needed (for itching/allergies.).  08/02/16   Park Liter P, DO  DULoxetine (CYMBALTA) 60 MG capsule Take 1 capsule (60 mg total) by mouth daily. 06/23/17   Valerie Roys, DO  Elastic Bandages & Supports (T.E.D. THIGH LENGTH/L-REGULAR) MISC 2 application by Does not apply route every morning. 03/18/16   Merlyn Lot, MD  enoxaparin (LOVENOX) 40 MG/0.4ML injection Inject 0.4 mLs (40 mg total) into the skin daily. Patient not taking: Reported on 02/22/2018 12/22/17   Park Liter P, DO  EPINEPHRINE 0.3 mg/0.3 mL IJ SOAJ injection INJECT 0.3 ML( 0.3 MG) INTO THE MUSCLE ONCE 12/05/16   Johnson, Megan P, DO  Insulin Pen Needle 31G X 6 MM MISC 1 each by Does not apply route daily. 11/28/17   Johnson, Megan P, DO  PROAIR HFA 108 (90 Base) MCG/ACT inhaler INHALE 2 PUFFS BY MOUTH EVERY 4 HOURS AS NEEDED FOR WHEEZING Patient not taking: Reported on 02/22/2018 09/26/16   Valerie Roys, DO    Tawnya Crook, PharmD Pharmacy Resident  03/23/2018 10:39 AM

## 2018-03-23 NOTE — Consult Note (Addendum)
Referring Physician: Lavonia Drafts    Chief Complaint: Right facial and arm numbness and weakness, left eye pain, headache  HPI: Shirley Rogers is an 52 y.o. female with multiple medical issue including history of hypertension, pulmonary embolism on Eliquis ,coronary artery disease, complex hemiplegic migraine with aura, CVA, obstructive sleep apnea on CPAP presenting to the ED as a code stroke with complaints of right-sided weakness and numbness, left facial droop and headache since 8:30 this morning.  She was not sure if she was having another stroke or another episode of her complex migraine she therefore presented to the ED for further evaluation.  Patient states that she has had very bad headache x2 weeks that has been refractory to medication. There is associated vision disturbance which she describes blurry vision prior to onset of headache. Headache is associated with nausea, light and noise sensitivity, irritability and difficulty with concentration. Symptoms worsen with physical activity, lying flat or bending forward and improves with rest or sleeping in a dark quite room. Denies associated altered sensorium, speech abnormality, seizures,diplopia, or vomiting, syncope or LOC.  She however reports that her headaches are usually associated with paresthesia (numbness, tingling, pins-and-needles sensation) or a heavy feeling in her arms. She had a lumbar puncture about a year ago which showed elevated opening pressure of 33 consistent with pseudotumor cerebri and was started on methazolamide 25 mg 3 times daily.  Patient has been evaluated for similar episode of right-sided weakness, right-sided numbness and facial droop in the past with most recent at [facility] on 07/17/2017.  She had a work-up including CT head, CTA and MRI brain which was unremarkable at that time.  She is noted to have left facial droop and left eye ptosis during this admission, she is also complaining of right sided numbness and weakness.   Initial NIH stroke scale was 4.  Patient had CT scan of the head which did not show acute finding.  However there was old infarction in the right basal ganglia stroke external capsule noted.  Date last known well: Date: 03/23/2018 Time last known well: Time: 08:30 tPA Given: No: Disqualifying criteria met: Patient is taking anticoagulation for pulmonary embolism.  Past Medical History:  Diagnosis Date  . Anxiety    on xanax in the past  . ASCVD (arteriosclerotic cardiovascular disease)    carotids  . Barrett esophagus   . 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 Carotid atherosclerosis 2003   on ultrasound  . Colon polyp   . Complicated migraine    with facial drooping  . Concussion    1974,1982,1987,2016,2016  . DDD (degenerative disc disease), lumbar    MRI 2008, L3-4, L5-S1, spondylotic changes, multilevel facet joint hypertrophic changes  . Depression    on paxil, lexapro, cymbalta in the past  . Diverticulosis   . Dizziness due to old head injury   . Family history of adverse reaction to anesthesia    father - slow to wake  . Fatty liver 2013   On CT abdomen/pelvis  . Fibrocystic breast disease   . Fibromyalgia   . Fracture    left knee medial femoral condyle stress fracture, chondromalacia  . GERD (gastroesophageal reflux disease)   . Hemiplegic migraine    daily  . Hemorrhoids   . History of kidney stones   . Hyperlipidemia   . Hypertension    no meds currently.  . IBS (irritable bowel syndrome)   . Insomnia chronic  . Kidney stone 2013   on CT  abdomen/pelvis  . 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 Migraine   . Narrowing of intervertebral disc space 2011   C5-6  . Normal cardiac stress test 2009   EF 66%  . Pneumonia   . Pulmonary emboli ([place])   . Recurrent sinusitis   . Recurrent UTI    question about IC in the past  . Sleep apnea    CPAP machine broken  . Stroke ([place])    2000 - no deficits   . Umbilical hernia 3953   on CT abdomen/pelvis  . Varicose veins    right lower leg  . Vascular spasm ([place])   . Vertigo    from concussive disorder.  seeing neuro 03/23/15    Past Surgical History:  Procedure Laterality Date  . Biopsy Punch Thyroid    . BREAST EXCISIONAL BIOPSY Right 2000   right mass excision - atypical hyperplasia  . BREAST SURGERY Right    atypical hyperplasia  . CHOLECYSTECTOMY N/A 07/24/2015   Procedure: LAPAROSCOPIC CHOLECYSTECTOMY WITH INTRAOPERATIVE CHOLANGIOGRAM;  Surgeon: Leonie Green, MD;  Location: [facility] ORS;  Service: General;  Laterality: N/A;  . COLONOSCOPY W/ BIOPSIES     Removed 5 polyps  . COLONOSCOPY WITH PROPOFOL N/A 03/03/2017   Procedure: COLONOSCOPY WITH PROPOFOL;  Surgeon: [name], MD;  [facility] END[facility]COPY;  Service: Gastroenterology;  Laterality: N/A;  . COLONOSCOPY WITH PROPOFOL N/A 01/08/2018   Procedure: COLONOSCOPY WITH PROPOFOL;  Surgeon: [name], MD;  Location: [facility] ENDOSCOPY;  Service: Gastroenterology;  Laterality: N/A;  . ESOPHAGOGASTRODUODENOSCOPY (EGD) WITH PROPOFOL N/A 01/08/2018   Procedure: ESOPHAGOGASTRODUODENOSCOPY (EGD) WITH PROPOFOL;  Surgeon: [name], MD;  Location: [facility] ENDOSCOPY;  Service: Gastroenterology;  Laterality: N/A;  . KNEE ARTHROSCOPY WITH SUBCHONDROPLASTY Left 05/26/2017   Procedure: LEFT KNEE ARTHROSCOPY WITH SUBCHONDROPLASTY, CHONDROPLASTY, SYNOVECTOMY, POSSIBLE MICROFX;  Surgeon: Leandrew Koyanagi, MD;  Location: [place];  Service: Orthopedics;  Laterality: Left;  . TUBAL LIGATION      Family History  Problem Relation Age of Onset  . Other Mother        IBS  . Hypertension Mother   . Heart disease Father   . Diverticulosis Father   . Migraines Father   . Other Son        Ulcerative Colitis, Polonidal cyst  . Parkinson's disease Maternal Grandmother   . Cancer Maternal Grandmother   . Mental illness Paternal Grandmother   . Stroke Paternal Grandmother   . Cancer Paternal Grandmother         brain cancer  . Bipolar disorder Paternal Grandmother   . Stroke Maternal Grandfather   . Cancer Maternal Grandfather        Bladder, Prostate/bladder/prostate  . Heart disease Paternal Grandfather   . Heart attack Paternal Grandfather   . Macular degeneration Unknown   . Diabetes Brother   . Breast cancer Maternal Aunt    Social History:  reports that she has been smoking cigarettes. She has never used smokeless tobacco. She reports that she does not drink alcohol or use drugs.  Allergies:  Allergies  Allergen Reactions  . Rocephin [Ceftriaxone] Anaphylaxis    Rocephin - throat swelling, rash  . Statins Anaphylaxis, Diarrhea and Nausea And Vomiting  . Sulfa Antibiotics Diarrhea, Nausea And Vomiting, Rash and Anaphylaxis    Bactrim - severe itching   . 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 Compazine [Prochlorperazine] Other (See Comments)    AKATHESIA  . Erythromycin Itching, Nausea And Vomiting and Rash    Patient states she is allergic to all mycin drugs  . Lidocaine Swelling    SWELLING REACTION UNSPECIFIED   . Meloxicam     UNSPECIFIED REACTION   . Nitrofuran Derivatives     biaxin - severe itching  . Ace Inhibitors Itching  . Biaxin [Clarithromycin] Itching, Nausea And Vomiting and Rash  . Doxycycline Nausea And Vomiting  . Latex Rash  . Morphine And Related Nausea And Vomiting    Roxanol - severe itching  . Sulfamethoxazole-Trimethoprim Diarrhea and Nausea And Vomiting    SULFONAMIDE ANTIBIOTICS     Medications: I have reviewed the patient's current medications. Prior to Admission:  (Not in a hospital admission) Scheduled:  ROS: History obtained from the patient   General ROS: negative for - chills, fatigue,  fever, night sweats, weight gain or weight loss Psychological ROS: negative for - behavioral disorder, hallucinations, memory difficulties, mood swings or suicidal ideation Ophthalmic ROS: Positive for- blurry vision, double vision, eye pain or loss of vision ENT ROS: negative for - epistaxis, nasal discharge, oral lesions, sore throat, tinnitus or vertigo Allergy and Immunology ROS: negative for - hives or itchy/watery eyes Hematological and Lymphatic ROS: negative for - bleeding problems, bruising or swollen lymph nodes Endocrine ROS: negative for - galactorrhea, hair pattern changes, polydipsia/polyuria or temperature intolerance Respiratory ROS: negative for - cough, hemoptysis, shortness of breath or wheezing Cardiovascular ROS: negative for - chest pain, dyspnea on exertion, edema or irregular heartbeat Gastrointestinal ROS: negative for - abdominal pain, diarrhea, hematemesis, nausea/vomiting or stool incontinence Genito-Urinary ROS: negative for - dysuria, hematuria, incontinence or urinary frequency/urgency Musculoskeletal ROS: negative for - joint swelling or muscular weakness Neurological ROS: as noted in HPI Dermatological ROS: negative for rash and skin lesion changes  Physical Examination: Blood pressure (!) 158/99, pulse 77, temperature (!) 97.3 F (36.3 C), temperature source Oral, resp. rate 16, last menstrual period 07/01/2013, SpO2 98 %.   HEENT-  Normocephalic, no lesions, without obvious abnormality.  Normal external eye and conjunctiva.  Normal TM's bilaterally.  Normal auditory canals and external ears. Normal external nose, mucus membranes and septum.  Normal pharynx. Cardiovascular- S1, S2 normal, pulses palpable throughout   Lungs- chest clear, no wheezing, rales, normal symmetric air entry Abdomen- soft, non-tender; bowel sounds normal; no masses,  no organomegaly Extremities- no edema Lymph-no adenopathy palpable Musculoskeletal-no joint tenderness, deformity or  swelling Skin-warm and dry, no hyperpigmentation, vitiligo, or suspicious lesions  Neurological Exam   Mental Status: Alert, oriented, thought content appropriate.  Speech fluent without evidence of aphasia.  Able to follow 3 step commands without difficulty. Attention span and concentration seemed appropriate  Cranial Nerves: II: Discs flat bilaterally; Visual fields grossly normal, pupils equal, round, reactive to light and accommodation III,IV, VI: ptosis present left eye, extra-ocular motions intact bilaterally V,VII: smile is symmetric on the right, facial light touch sensation  decreased on the right VIII: hearing normal bilaterally IX,X: gag reflex present XI: bilateral shoulder shrug XII: midline tongue extension Motor: Right :  Upper extremity   4/5 Without pronator drift      Left: Upper extremity   5/5 without pronator drift Right:   Lower extremity   5/5  Left: Lower extremity   5/5 Tone and bulk:normal tone throughout; no atrophy noted Sensory: Pinprick and light touch  decreased on the right Deep Tendon Reflexes: 2+ and symmetric throughout Plantars: Right:  Downgoing                              Left: Downgoing Cerebellar: Finger-to-nose testing intact bilaterally. Heel to shin testing normal bilaterally Gait: not tested due to safety concerns  Data Reviewed  Laboratory Studies:  Basic Metabolic Panel: Recent Labs  Lab 03/23/18 0959  NA 141  K 3.7  CL 105  CO2 25  GLUCOSE 85  BUN 18  CREATININE 0.79  CALCIUM 8.9    Liver Function Tests: Recent Labs  Lab 03/23/18 0959  AST 19  ALT 14  ALKPHOS 51  BILITOT 0.7  PROT 6.9  ALBUMIN 4.1   No results for input(s): LIPASE, AMYLASE in the last 168 hours. No results for input(s): AMMONIA in the last 168 hours.  CBC: Recent Labs  Lab 03/23/18 0959  WBC 5.3  NEUTROABS 2.9  HGB 13.3  HCT 40.8  MCV 92.9  PLT 216    Cardiac Enzymes: Recent Labs  Lab  03/23/18 0959  TROPONINI <0.03    BNP: Invalid input(s): POCBNP  CBG: Recent Labs  Lab 03/23/18 0946  GLUCAP 101*    Microbiology: Results for orders placed or performed in visit on 11/16/17  Microscopic Examination     Status: Abnormal   Collection Time: 11/16/17  3:51 PM  Result Value Ref Range Status   WBC, UA 0-5 0 - 5 /hpf Final   RBC, UA 3-10 (A) 0 - 2 /hpf Final   Epithelial Cells (non renal) 0-10 0 - 10 /hpf Final   Bacteria, UA None seen None seen/Few Final    Coagulation Studies: Recent Labs    03/23/18 0959  LABPROT 12.3  INR 0.92    Urinalysis: No results for input(s): COLORURINE, LABSPEC, PHURINE, GLUCOSEU, HGBUR, BILIRUBINUR, KETONESUR, PROTEINUR, UROBILINOGEN, NITRITE, LEUKOCYTESUR in the last 168 hours.  Invalid input(s): APPERANCEUR  Lipid Panel:    Component Value Date/Time   CHOL 222 (H) 11/16/2017 1610   TRIG 212 (H) 11/16/2017 1610   HDL 45 11/16/2017 1610   CHOLHDL 5.0 06/11/2016 0558   VLDL 20 06/11/2016 0558   LDLCALC 135 (H) 11/16/2017 1610    HgbA1C:  Lab Results  Component Value Date   HGBA1C 5.3 06/11/2016    Urine Drug Screen:      Component Value Date/Time   LABOPIA POSITIVE (A) 06/10/2016 1335   COCAINSCRNUR NONE DETECTED 06/10/2016 1335   LABBENZ POSITIVE (A) 06/10/2016 1335   AMPHETMU NONE DETECTED 06/10/2016 1335   THCU POSITIVE (A) 06/10/2016 1335   LABBARB NONE DETECTED 06/10/2016 1335    Alcohol Level: No results for input(s): ETH in the last 168 hours.  Other results: EKG: normal EKG, normal sinus rhythm, unchanged from previous tracings.  Imaging: Ct Head Code Stroke Wo Contrast  Result Date: 03/23/2018 CLINICAL DATA:  Code stroke. Right-sided facial droop and right-sided leg weakness. EXAM: CT HEAD WITHOUT CONTRAST TECHNIQUE: Contiguous axial images were obtained from the base of the skull through the vertex without intravenous contrast. COMPARISON:  MRI 06/10/2016.  CT 06/10/2016 FINDINGS: Brain: No  acute CT finding. The brainstem and cerebellum are normal. There is a dilated perivascular space at the base of the brain on the left. Old lacunar infarction in the right basal ganglia/external capsule.  No other brain parenchymal finding. No mass, hemorrhage, hydrocephalus or extra-axial collection. Vascular: No hyperdense vessels. Skull: Negative Sinuses/Orbits: Clear/normal Other: None ASPECTS ([place] Stroke Program Early CT Score) - Ganglionic level infarction (caudate, lentiform nuclei, internal capsule, insula, M1-M3 cortex): 7 - Supraganglionic infarction (M4-M6 cortex): 3 Total score (0-10 with 10 being normal): 10 IMPRESSION: 1. No acute finding by CT. Old infarction in the right basal ganglia/external capsule. 2. ASPECTS is 10. 3. These results were called by telephone at the time of interpretation on 03/23/2018 at 10:02 am to Dr. Lavonia Drafts , who verbally acknowledged these results. Electronically Signed   By: Nelson Chimes M.D.   On: 03/23/2018 10:04    Assessment: 52 y.o. female with multiple medical problems including history of hypertension, pulmonary embolism on Eliquis ,coronary artery disease, complex hemiplegic migraine with aura, elevated ICP with opening pressure of 33 consistent with pseudotumor cerebri, stroke, and untreated obstructive sleep apnea presenting to the ED with recurrent episode of right sided weakness and numbness, right facial droop, left eye ptosis, and headache.  Etiology consistent with her history of complex migraine, however given history of both complex hemiplegic migraine and prior stroke, further work-up was warranted to rule out stroke.  Initial CT head reviewed and showed no acute intracranial abnormality. Small old stroke in the basal ganglia noted. NIH stroke scale was 4 patient did not qualify for IV TPA because she is currently on anticoagulation for pulmonary embolism.     Stroke Risk Factors - family history, hyperlipidemia, hypertension and prior  stroke  Plan: Agree with treatment of migraine headaches with IV cocktail, patient states has worked for her in the past.  However if symptoms do not improve with current management, recommend obtaining MRI of the brain to differentiate between an ischemic infarct and another complex migraine.  At this time, presentation more consistent with complex migraine unless proven otherwise.  She has a scheduled follow-up appointment with her regular neurology.   This patient was staffed with Dr. Irish Elders, Alease Frame who personally evaluated patient, reviewed documentation and agreed with assessment and plan of care as above.  Rufina Falco, DNP, FNP-BC Board certified Nurse Practitioner Neurology Department  03/23/2018, 10:44 AM

## 2018-03-23 NOTE — ED Triage Notes (Signed)
C/O bad headache x 2 weeks.  Today at 0830 noticed right side of face numb and right arm numb and left eye pain.  Has history of hemiplegia migraines and has an appointment at [facility] on 11/125 for a lumbar puncture to "relieve the presssure"  Right facial droop noted.  Speech clear.

## 2018-03-23 NOTE — Progress Notes (Signed)
Chaplain followed up from an earlier page. Chaplain offered spiritual services and Pt accepted prayer. Chaplain prayed for Pt and no other services were needed.    03/23/18 1300  Clinical Encounter Type  Visited With Patient  Visit Type Initial;Code  Referral From Nurse  Spiritual Encounters  Spiritual Needs Prayer

## 2018-03-26 ENCOUNTER — Other Ambulatory Visit: Payer: Self-pay | Admitting: Family Medicine

## 2018-03-27 NOTE — Telephone Encounter (Signed)
Requested Prescriptions  Pending Prescriptions Disp Refills  . traZODone (DESYREL) 100 MG tablet [Pharmacy Med Name: TRAZODONE 100MG  TABLETS] 90 tablet 0    Sig: TAKE 1 TABLET(100 MG) BY MOUTH AT BEDTIME AS NEEDED FOR SLEEP     Psychiatry: Antidepressants - Serotonin Modulator Passed - 03/26/2018  4:39 PM      Passed - Completed PHQ-2 or PHQ-9 in the last 360 days.      Passed - Valid encounter within last 6 months    Recent Outpatient Visits          3 months ago Routine general medical examination at a health care facility   [facility], [place] P, DO   4 months ago Hemiplegic migraine without status migrainosus, not intractable   [place], Megan P, DO   10 months ago Pre-procedural general physical examination   [place], Megan P, DO   11 months ago Herpes zoster with ophthalmic complication, unspecified herpes zoster eye disease   [facility] Kathrine Haddock, NP   11 months ago Chronic pain of left knee   [facility] [place], [place], DO      Future Appointments            In 2 months [place], Megan P, DO [facility], [facility]         . buPROPion (WELLBUTRIN SR) 150 MG 12 hr tablet [Pharmacy Med Name: BUPROPION SR 150MG  TABLETS (12 H)] 180 tablet 0    Sig: TAKE 1 TABLET BY MOUTH EVERY MORNING FOR 3 DAYS THEN TAKE 1 TABLET BY MOUTH TWICE DAILY THEREAFTER     Psychiatry: Antidepressants - bupropion Passed - 03/26/2018  4:39 PM      Passed - Completed PHQ-2 or PHQ-9 in the last 360 days.      Passed - Last BP in normal range    BP Readings from Last 1 Encounters:  03/23/18 119/71         Passed - Valid encounter within last 6 months    Recent Outpatient Visits          3 months ago Routine general medical examination at a health care facility   [facility], [place] P, DO   4 months ago Hemiplegic migraine without status migrainosus, not intractable   [place], Megan P, DO   10 months ago Pre-procedural general physical examination   [place], Megan P, DO   11 months ago Herpes zoster with ophthalmic complication, unspecified herpes zoster eye disease   [facility] Kathrine Haddock, NP   11 months ago Chronic pain of left knee   [facility] [place], [place], DO      Future Appointments            In 2 months Wynetta Emery, Barb Merino, DO [employer], [place]         . SAXENDA 18 MG/3ML SOPN [Pharmacy Med Name: SAXENDA 6MG /ML PEN INJ 3ML]  0    Sig: INJECT 0.6 MG UNDER THE SKIN DAILY FOR 7 DAYS, 1.2 MG DAILY FOR 7 DAYS, 1.8 MG DAILY FOR 7 DAYS, 2.4 MG DAILY FOR 7 DAYS, 3 MG THEREAFTER     Endocrinology:  Diabetes - GLP-1 Receptor Agonists Passed - 03/26/2018  4:39 PM      Passed - HBA1C is between 0 and 7.9 and within 180 days    Hgb A1c MFr Bld  Date Value Ref  Range Status  06/11/2016 5.3 4.8 - 5.6 % Final    Comment:    (NOTE)         Pre-diabetes: 5.7 - 6.4         Diabetes: >6.4         Glycemic control for adults with diabetes: <7.0          Passed - Valid encounter within last 6 months    Recent Outpatient Visits          3 months ago Routine general medical examination at a health care facility   [facility], [place] P, DO   4 months ago Hemiplegic migraine without status migrainosus, not intractable   [place], Megan P, DO   10 months ago Pre-procedural general physical examination   [place], Megan P, DO   11 months ago Herpes zoster with ophthalmic complication, unspecified herpes zoster eye disease   [facility] Kathrine Haddock, NP   11 months ago Chronic pain of left knee   [facility] [place], [place], DO      Future Appointments            In 2 months Wynetta Emery, Barb Merino, DO [employer], [facility]         . ELIQUIS 5 MG TABS tablet  [Pharmacy Med Name: ELIQUIS 5MG  TABLETS] 180 tablet 0    Sig: TAKE 1 TABLET(5 MG) BY MOUTH TWICE DAILY     Hematology:  Anticoagulants Passed - 03/26/2018  4:39 PM      Passed - HGB in normal range and within 360 days    Hemoglobin  Date Value Ref Range Status  03/23/2018 13.3 12.0 - 15.0 g/dL Final  11/16/2017 13.1 11.1 - 15.9 g/dL Final         Passed - PLT in normal range and within 360 days    Platelets  Date Value Ref Range Status  03/23/2018 216 150 - 400 K/uL Final  11/16/2017 232 150 - 450 x10E3/uL Final         Passed - HCT in normal range and within 360 days    HCT  Date Value Ref Range Status  03/23/2018 40.8 36.0 - 46.0 % Final   Hematocrit  Date Value Ref Range Status  11/16/2017 39.1 34.0 - 46.6 % Final         Passed - Cr in normal range and within 360 days    Creatinine  Date Value Ref Range Status  06/07/2014 0.87 0.60 - 1.30 mg/dL Final   Creatinine, Ser  Date Value Ref Range Status  03/23/2018 0.79 0.44 - 1.00 mg/dL Final         Passed - Valid encounter within last 12 months    Recent Outpatient Visits          3 months ago Routine general medical examination at a health care facility   [facility], [place] P, DO   4 months ago Hemiplegic migraine without status migrainosus, not intractable   [place], Megan P, DO   10 months ago Pre-procedural general physical examination   [place], Megan P, DO   11 months ago Herpes zoster with ophthalmic complication, unspecified herpes zoster eye disease   [facility] Kathrine Haddock, NP   11 months ago Chronic pain of left knee   [facility] [place], Barb Merino, DO      Future Appointments  In 2 months Wynetta Emery, Barb Merino, DO [facility], [facility]

## 2018-03-27 NOTE — Telephone Encounter (Signed)
Requested medication (s) are due for refill today: saxenda yes   Requested medication (s) are on the active medication list: yes  Last refill:  02/16/18  Future visit scheduled: yes  Notes to clinic: historical provider   Requested medication (s) are due for refill today: eliquis yes  Requested medication (s) are on the active medication list: yes  Last refill:  02/16/18  Future visit scheduled: yes  Notes to clinic:  No protocol    Requested Prescriptions  Pending Prescriptions Disp Refills   SAXENDA 18 MG/3ML SOPN [Pharmacy Med Name: SAXENDA 6MG /ML PEN INJ 3ML]  0    Sig: INJECT 0.6 MG UNDER THE SKIN DAILY FOR 7 DAYS, 1.2 MG DAILY FOR 7 DAYS, 1.8 MG DAILY FOR 7 DAYS, 2.4 MG DAILY FOR 7 DAYS, 3 MG THEREAFTER     Endocrinology:  Diabetes - GLP-1 Receptor Agonists Passed - 03/26/2018  4:39 PM      Passed - HBA1C is between 0 and 7.9 and within 180 days    Hgb A1c MFr Bld  Date Value Ref Range Status  06/11/2016 5.3 4.8 - 5.6 % Final    Comment:    (NOTE)         Pre-diabetes: 5.7 - 6.4         Diabetes: >6.4         Glycemic control for adults with diabetes: <7.0          Passed - Valid encounter within last 6 months    Recent Outpatient Visits          3 months ago Routine general medical examination at a health care facility   [facility], Megan P, DO   4 months ago Hemiplegic migraine without status migrainosus, not intractable   [place], Megan P, DO   10 months ago Pre-procedural general physical examination   [place], Megan P, DO   11 months ago Herpes zoster with ophthalmic complication, unspecified herpes zoster eye disease   [facility] Kathrine Haddock, NP   11 months ago Chronic pain of left knee   [facility] [place], [place], DO      Future Appointments            In 2 months Johnson, Megan P, DO [place], [facility]          ELIQUIS 5 MG  TABS tablet [employer] Med Name: ELIQUIS 5MG  TABLETS] 180 tablet 0    Sig: TAKE 1 TABLET(5 MG) BY MOUTH TWICE DAILY     Hematology:  Anticoagulants Passed - 03/26/2018  4:39 PM      Passed - HGB in normal range and within 360 days    Hemoglobin  Date Value Ref Range Status  03/23/2018 13.3 12.0 - 15.0 g/dL Final  11/16/2017 13.1 11.1 - 15.9 g/dL Final         Passed - PLT in normal range and within 360 days    Platelets  Date Value Ref Range Status  03/23/2018 216 150 - 400 K/uL Final  11/16/2017 232 150 - 450 x10E3/uL Final         Passed - HCT in normal range and within 360 days    HCT  Date Value Ref Range Status  03/23/2018 40.8 36.0 - 46.0 % Final   Hematocrit  Date Value Ref Range Status  11/16/2017 39.1 34.0 - 46.6 % Final         Passed - Cr  in normal range and within 360 days    Creatinine  Date Value Ref Range Status  06/07/2014 0.87 0.60 - 1.30 mg/dL Final   Creatinine, Ser  Date Value Ref Range Status  03/23/2018 0.79 0.44 - 1.00 mg/dL Final         Passed - Valid encounter within last 12 months    Recent Outpatient Visits          3 months ago Routine general medical examination at a health care facility   [facility], [place] P, DO   4 months ago Hemiplegic migraine without status migrainosus, not intractable   [place], Megan P, DO   10 months ago Pre-procedural general physical examination   [place], Megan P, DO   11 months ago Herpes zoster with ophthalmic complication, unspecified herpes zoster eye disease   [facility] Kathrine Haddock, NP   11 months ago Chronic pain of left knee   [facility] [place], [place], DO      Future Appointments            In 2 months Johnson, Megan P, DO [place], [facility]         Signed Prescriptions Disp Refills   traZODone (DESYREL) 100 MG tablet 90 tablet 0    Sig: TAKE 1 TABLET(100 MG) BY MOUTH AT  BEDTIME AS NEEDED FOR SLEEP     Psychiatry: Antidepressants - Serotonin Modulator Passed - 03/26/2018  4:39 PM      Passed - Completed PHQ-2 or PHQ-9 in the last 360 days.      Passed - Valid encounter within last 6 months    Recent Outpatient Visits          3 months ago Routine general medical examination at a health care facility   [facility], [place] P, DO   4 months ago Hemiplegic migraine without status migrainosus, not intractable   [place], Megan P, DO   10 months ago Pre-procedural general physical examination   [place], Megan P, DO   11 months ago Herpes zoster with ophthalmic complication, unspecified herpes zoster eye disease   [facility] Kathrine Haddock, NP   11 months ago Chronic pain of left knee   [facility] [place], [place], DO      Future Appointments            In 2 months Johnson, Megan P, DO [place], [facility]          buPROPion (WELLBUTRIN SR) 150 MG 12 hr tablet 180 tablet 0    Sig: TAKE 1 TABLET BY MOUTH EVERY MORNING FOR 3 DAYS THEN TAKE 1 TABLET BY MOUTH TWICE DAILY THEREAFTER     Psychiatry: Antidepressants - bupropion Passed - 03/26/2018  4:39 PM      Passed - Completed PHQ-2 or PHQ-9 in the last 360 days.      Passed - Last BP in normal range    BP Readings from Last 1 Encounters:  03/23/18 119/71         Passed - Valid encounter within last 6 months    Recent Outpatient Visits          3 months ago Routine general medical examination at a health care facility   [facility], [place] P, DO   4 months ago Hemiplegic migraine without status migrainosus, not intractable   [facility] [place]  [place], Megan P, DO   10 months ago Pre-procedural general physical examination   [place], Megan P, DO   11 months ago Herpes zoster with ophthalmic complication, unspecified herpes zoster eye disease    [facility] Kathrine Haddock, NP   11 months ago Chronic pain of left knee   [facility] [place], [place], DO      Future Appointments            In 2 months Wynetta Emery, Barb Merino, DO [employer], [facility]

## 2018-04-03 IMAGING — US US EXTREM LOW VENOUS BILAT
1 series · 13 of 24 positions shown · non-contrast
Comparison: None.

CLINICAL DATA: Acute shortness of breath. Left lower extremity
swelling. Pulmonary embolism.



[Series 1: us extrem low venous bilat · 0.08mm/px · 13 of 83 slices shown]
[im 1/83]
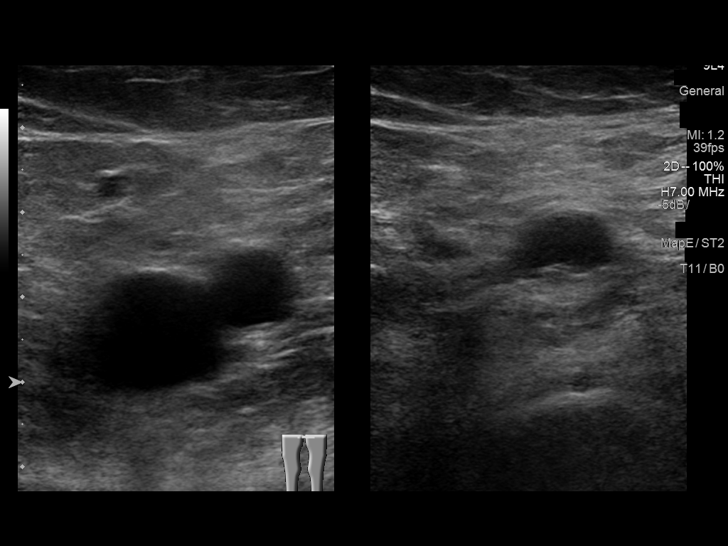
[im 8/83]
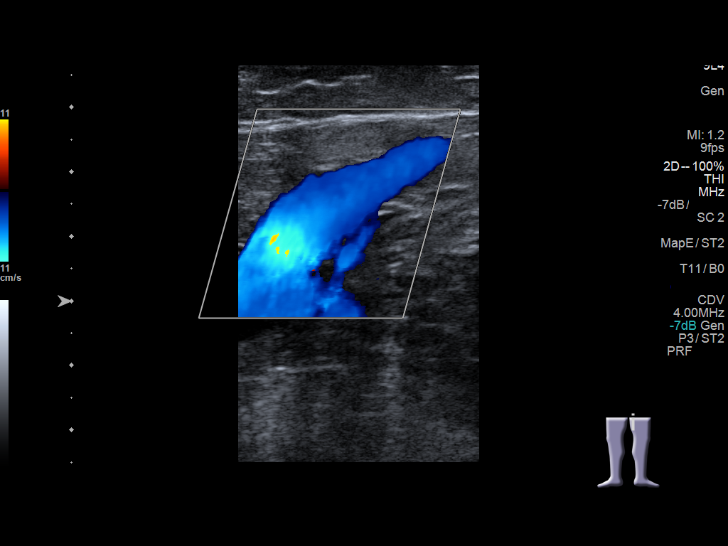
[im 15/83]
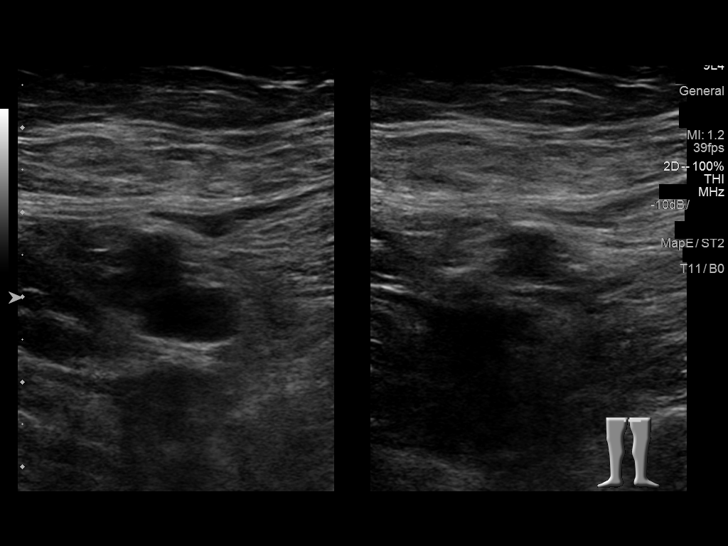
[im 22/83]
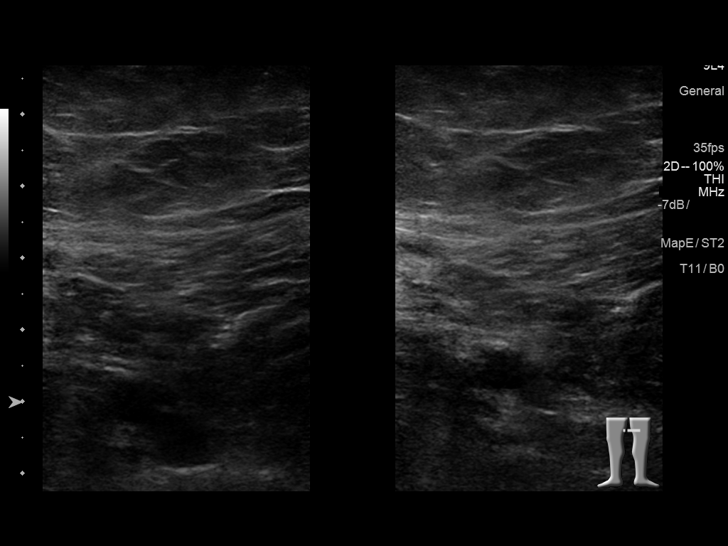
[im 29/83]
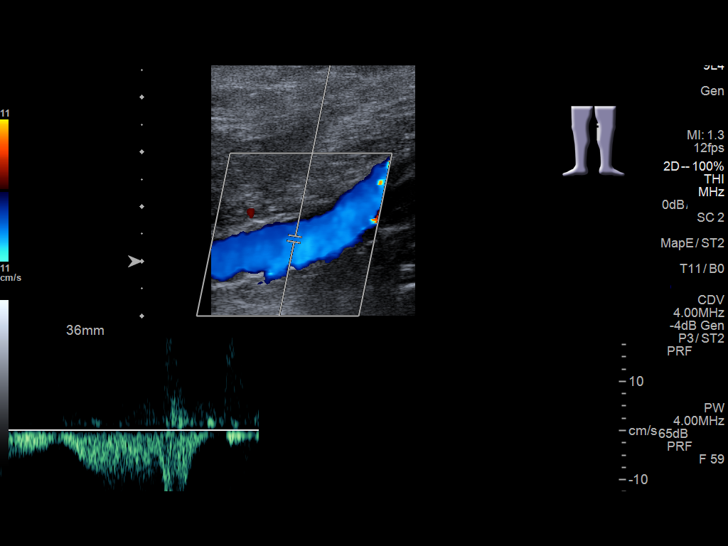
[im 36/83]
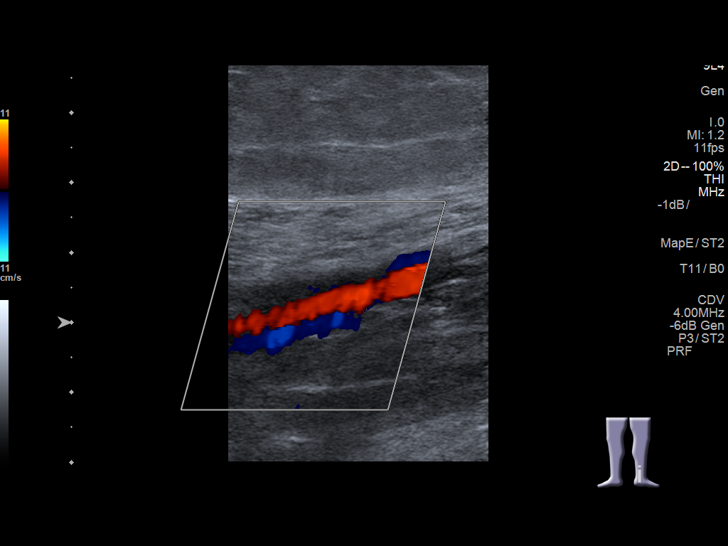
[im 43/83]
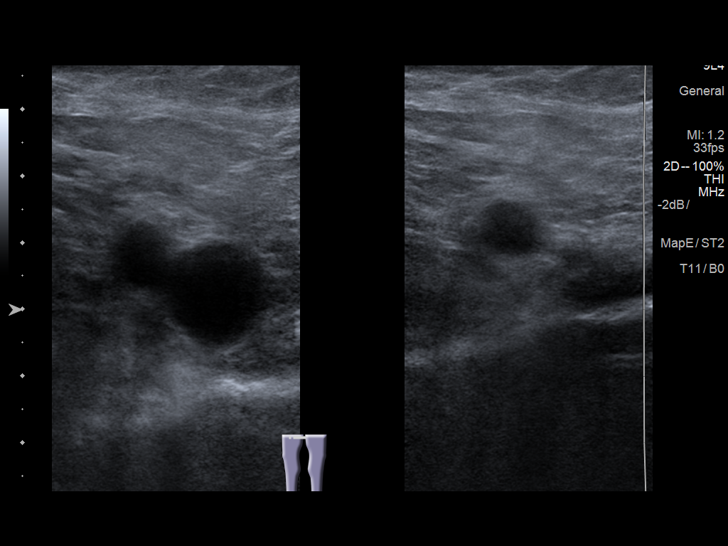
[im 47/83]
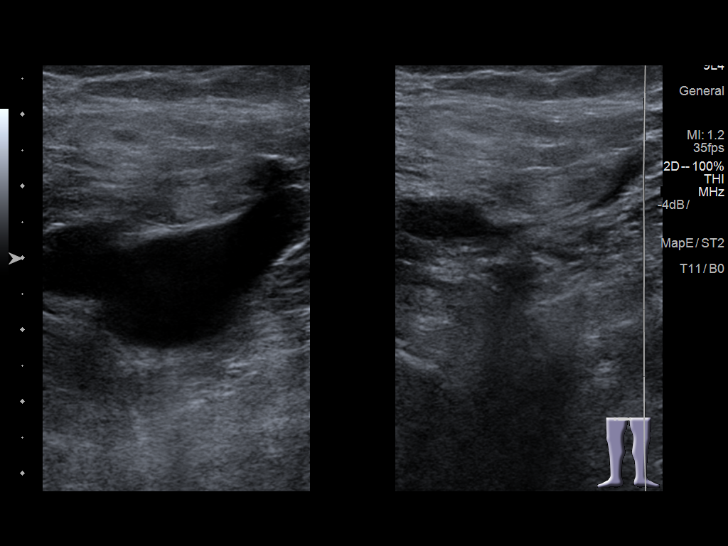
[im 54/83]
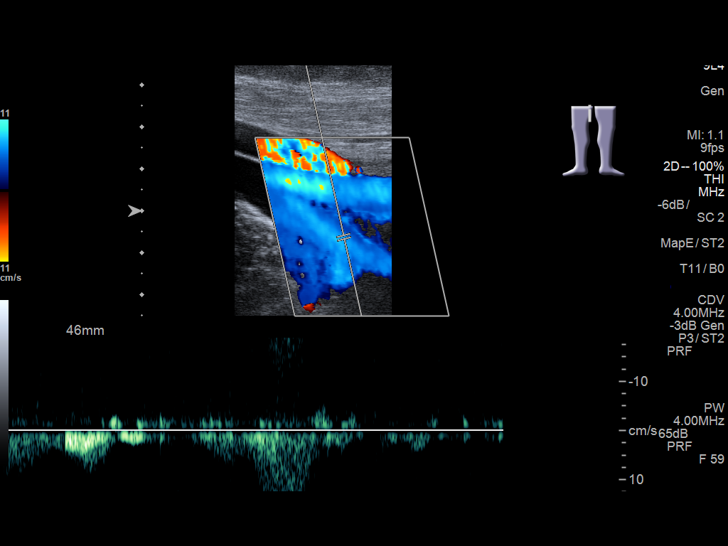
[im 61/83]
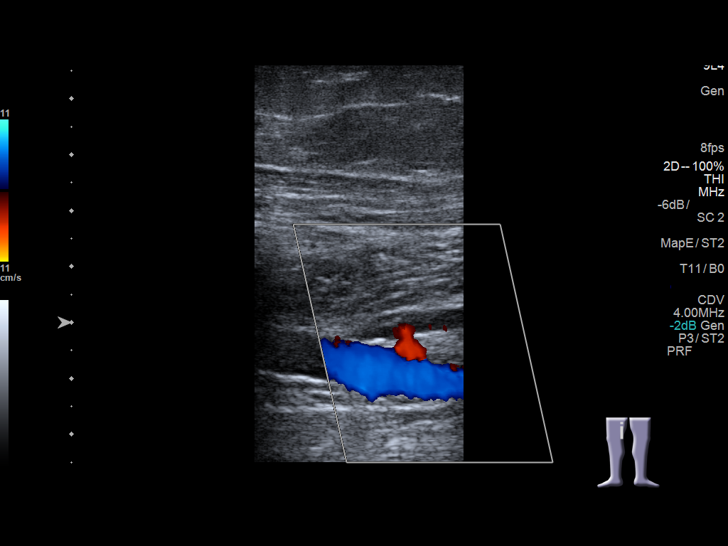
[im 68/83]
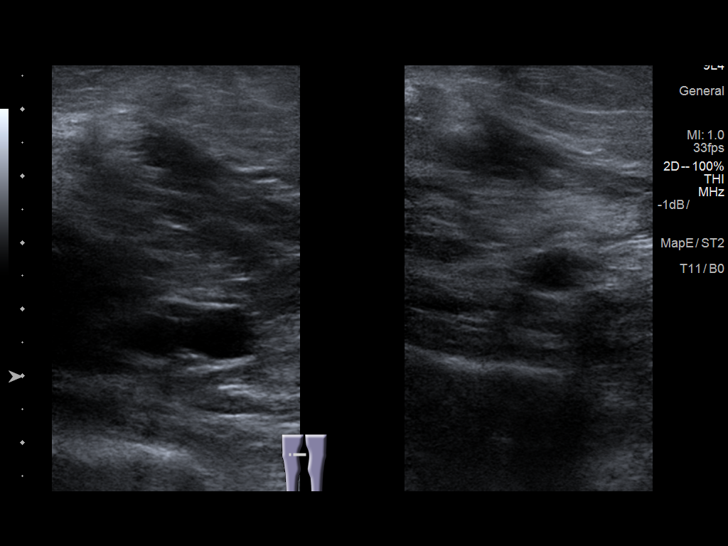
[im 75/83]
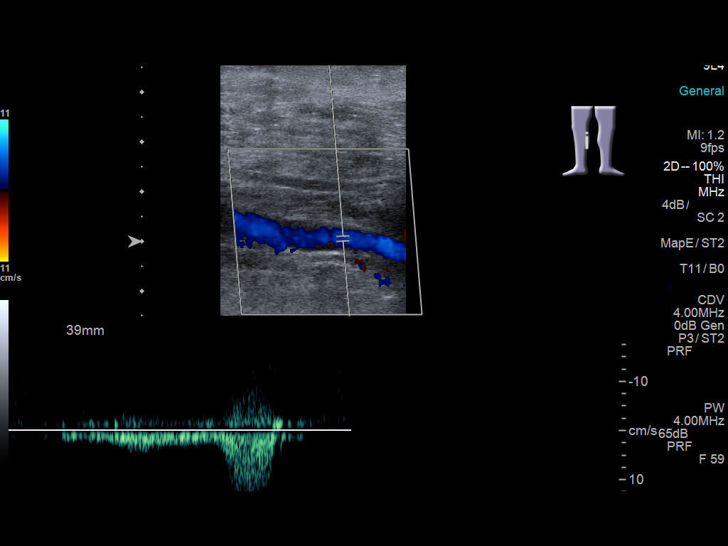
[im 83/83]
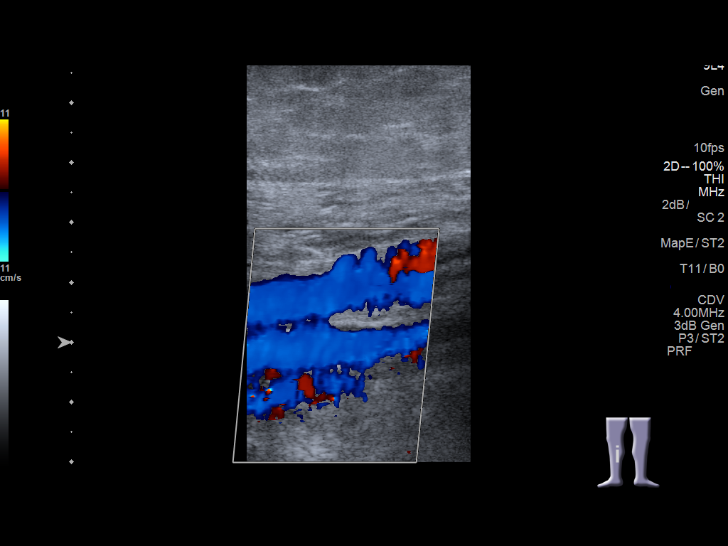

[13 of 24 positions shown; findings below may reference images not displayed]

FINDINGS: RIGHT LOWER EXTREMITY

Common Femoral Vein: No evidence of thrombus. Normal
compressibility, respiratory phasicity and response to augmentation.

Saphenofemoral Junction: No evidence of thrombus. Normal
compressibility and flow on color Doppler imaging.

Profunda Femoral Vein: No evidence of thrombus. Normal
compressibility and flow on color Doppler imaging.

Femoral Vein: No evidence of thrombus. Normal compressibility,
respiratory phasicity and response to augmentation.

Popliteal Vein: No evidence of thrombus. Normal compressibility,
respiratory phasicity and response to augmentation.

Calf Veins: No evidence of thrombus. Normal compressibility and flow
on color Doppler imaging.

LEFT LOWER EXTREMITY

Common Femoral Vein: No evidence of thrombus. Normal
compressibility, respiratory phasicity and response to augmentation.

Saphenofemoral Junction: No evidence of thrombus. Normal
compressibility and flow on color Doppler imaging.

Profunda Femoral Vein: No evidence of thrombus. Normal
compressibility and flow on color Doppler imaging.

Femoral Vein: No evidence of thrombus. Normal compressibility,
respiratory phasicity and response to augmentation.

Popliteal Vein: No evidence of thrombus. Normal compressibility,
respiratory phasicity and response to augmentation.

Calf Veins: No evidence of thrombus. Normal compressibility and flow
on color Doppler imaging.

Other Findings:  None.
IMPRESSION: No evidence of deep venous thrombosis.

## 2018-04-06 ENCOUNTER — Other Ambulatory Visit: Payer: Self-pay | Admitting: Family Medicine

## 2018-05-02 ENCOUNTER — Ambulatory Visit
Admission: RE | Admit: 2018-05-02 | Discharge: 2018-05-02 | Disposition: A | Discharge status: Home / Self Care | Payer: BC Managed Care – PPO | Source: Ambulatory Visit | Attending: Family Medicine | Admitting: Family Medicine

## 2018-05-02 ENCOUNTER — Ambulatory Visit: Payer: BC Managed Care – PPO | Admitting: Family Medicine

## 2018-05-02 ENCOUNTER — Encounter: Payer: Self-pay | Admitting: Family Medicine

## 2018-05-02 VITALS — BP 135/89 | HR 80 | Temp 98.3°F | Ht 64.0 in | Wt 235.2 lb

## 2018-05-02 DIAGNOSIS — M25561 Pain in right knee: Secondary | ICD-10-CM

## 2018-05-02 DIAGNOSIS — F331 Major depressive disorder, recurrent, moderate: Secondary | ICD-10-CM | POA: Diagnosis not present

## 2018-05-02 DIAGNOSIS — N644 Mastodynia: Secondary | ICD-10-CM

## 2018-05-02 DIAGNOSIS — M79622 Pain in left upper arm: Secondary | ICD-10-CM

## 2018-05-02 DIAGNOSIS — Z23 Encounter for immunization: Secondary | ICD-10-CM | POA: Diagnosis not present

## 2018-05-02 DIAGNOSIS — M25562 Pain in left knee: Secondary | ICD-10-CM

## 2018-05-02 DIAGNOSIS — G8929 Other chronic pain: Secondary | ICD-10-CM

## 2018-05-02 LAB — CBC WITH DIFFERENTIAL/PLATELET
Hematocrit: 40.1 % (ref 34.0–46.6)
Hemoglobin: 14.1 g/dL (ref 11.1–15.9)
LYMPHS ABS: 1.3 10*3/uL (ref 0.7–3.1)
LYMPHS: 28 %
MCH: 31.6 pg (ref 26.6–33.0)
MCHC: 35.2 g/dL (ref 31.5–35.7)
MCV: 90 fL (ref 79–97)
MID (Absolute): 0.4 10*3/uL (ref 0.1–1.6)
MID: 8 %
Neutrophils Absolute: 3 10*3/uL (ref 1.4–7.0)
Neutrophils: 64 %
PLATELETS: 211 10*3/uL (ref 150–450)
RBC: 4.46 x10E6/uL (ref 3.77–5.28)
RDW: 12.7 % (ref 12.3–15.4)
WBC: 4.7 10*3/uL (ref 3.4–10.8)

## 2018-05-02 MED ORDER — AMOXICILLIN 875 MG PO TABS: 875.0000 mg | ORAL_TABLET | Freq: Two times a day (BID) | ORAL | 0 refills | Status: DC

## 2018-05-02 MED ORDER — TRAMADOL HCL 50 MG PO TABS: 50.0000 mg | ORAL_TABLET | Freq: Two times a day (BID) | ORAL | 0 refills | Status: DC | PRN

## 2018-05-02 MED ORDER — DULOXETINE HCL 60 MG PO CPEP: 60.0000 mg | ORAL_CAPSULE | Freq: Every day | ORAL | 1 refills | Status: DC

## 2018-05-02 NOTE — Assessment & Plan Note (Signed)
Under good control on current regimen. Continue current regimen. Continue to monitor. Call with any concerns. Refills given.   

## 2018-05-02 NOTE — Patient Instructions (Signed)

## 2018-05-02 NOTE — Progress Notes (Addendum)
BP 135/89 (BP Location: Left Arm, Patient Position: Sitting, Cuff Size: Large)   Pulse 80   Temp 98.3 F (36.8 C)   Ht 5\' 4"  (1.626 m)   Wt 235 lb 3 oz (106.7 kg)   LMP 07/01/2013 Comment: tubal  SpO2 96%   BMI 40.37 kg/m    Subjective:    Patient ID: Shirley Rogers, female    DOB: [birth year], 52 y.o.   MRN: [id number]  HPI: Shirley Rogers is a 52 y.o. female  Chief Complaint  Patient presents with  . Arm Pain    left underarm that radiates to left breast  . Other    Patient had a spinal tap med November, she is unsure if this has anything to do with the dizziness that she is having   ARM PAIN Duration: A bit over a month Location: deep in her armpit radiating into L breast in the lower quadrants Mechanism of injury: unknown Onset: gradual Severity: severe  Quality:  Aching with occasional sharp pains Frequency: constant- waxes and wanes Radiation: yes Aggravating factors: nothing   Alleviating factors: nothing   Status: worse Treatments attempted: rest, ice and ibuprofen  Relief with NSAIDs?:  no Swelling: yes Redness: no  Warmth: no Trauma: no Chest pain: yes  Shortness of breath: no  Fever: yes Decreased sensation: no Paresthesias: yes Weakness: no  DEPRESSION Mood status: controlled Satisfied with current treatment?: yes Symptom severity: mild  Duration of current treatment : chronic Side effects: no Medication compliance: excellent compliance Psychotherapy/counseling: no  Depressed mood: yes Anxious mood: yes Anhedonia: no Significant weight loss or gain: no Insomnia: no  Fatigue: yes Feelings of worthlessness or guilt: no Impaired concentration/indecisiveness: no Suicidal ideations: no Hopelessness: no Crying spells: no Depression screen [facility] 2/9 05/02/2018 11/16/2017 04/04/2017 12/13/2016 10/06/2016  Decreased Interest 1 2 0 0 3  Down, Depressed, Hopeless 1 1 0 0 2  PHQ - 2 Score 2 3 0 0 5  Altered sleeping 2 0 - 1 2  Tired, decreased energy  2 3 - 0 3  Change in appetite 0 2 - 0 2  Feeling bad or failure about yourself  0 0 - 0 1  Trouble concentrating 2 0 - 0 2  Moving slowly or fidgety/restless 0 1 - 0 1  Suicidal thoughts 0 0 - 0 0  PHQ-9 Score 8 9 - 1 16  Difficult doing work/chores Not difficult at all Somewhat difficult - - -  Some recent data might be hidden   Has been trying to stay off her tramadol- but needs it occasionally. Hasn't had a refill in several months, would like a refill today.  Relevant past medical, surgical, family and social history reviewed and updated as indicated. Interim medical history since our last visit reviewed. Allergies and medications reviewed and updated.  Review of Systems  Constitutional: Negative.   Respiratory: Negative.   Cardiovascular: Negative.   Musculoskeletal: Negative.   Skin: Negative.   Neurological: Positive for numbness. Negative for dizziness, tremors, seizures, syncope, facial asymmetry, speech difficulty, weakness, light-headedness and headaches.  Hematological: Positive for adenopathy. Does not bruise/bleed easily.  Psychiatric/Behavioral: Negative.     Per HPI unless specifically indicated above     Objective:    BP 135/89 (BP Location: Left Arm, Patient Position: Sitting, Cuff Size: Large)   Pulse 80   Temp 98.3 F (36.8 C)   Ht 5\' 4"  (1.626 m)   Wt 235 lb 3 oz (106.7 kg)   LMP  07/01/2013 Comment: tubal  SpO2 96%   BMI 40.37 kg/m   Wt Readings from Last 3 Encounters:  05/02/18 235 lb 3 oz (106.7 kg)  02/22/18 238 lb 6.4 oz (108.1 kg)  01/24/18 237 lb (107.5 kg)    Physical Exam  Constitutional: She is oriented to person, place, and time. She appears well-developed and well-nourished. No distress.  HENT:  Head: Normocephalic and atraumatic.  Right Ear: Hearing normal.  Left Ear: Hearing normal.  Nose: Nose normal.  Eyes: Conjunctivae and lids are normal. Right eye exhibits no discharge. Left eye exhibits no discharge. No scleral icterus.    Cardiovascular: Normal rate, regular rhythm, normal heart sounds and intact distal pulses. Exam reveals no gallop and no friction rub.  No murmur heard. Pulmonary/Chest: Effort normal and breath sounds normal. No stridor. No respiratory distress. She has no wheezes. She has no rales. She exhibits no tenderness. Right breast exhibits no inverted nipple, no mass, no nipple discharge, no skin change and no tenderness. Left breast exhibits no inverted nipple, no mass, no nipple discharge, no skin change and no tenderness. No breast swelling, tenderness, discharge or bleeding. Breasts are symmetrical.  Musculoskeletal: Normal range of motion.  Lymphadenopathy:    She has axillary adenopathy.       Left axillary: Pectoral and lateral adenopathy present.  Neurological: She is alert and oriented to person, place, and time.  Skin: Skin is warm, dry and intact. Capillary refill takes less than 2 seconds. No rash noted. She is not diaphoretic. No erythema. No pallor.  Psychiatric: She has a normal mood and affect. Her speech is normal and behavior is normal. Judgment and thought content normal. Cognition and memory are normal.  Nursing note and vitals reviewed. Breast exam done today with Tiffany Reel, CMA in attendance.   Results for orders placed or performed during the hospital encounter of 03/23/18  Protime-INR  Result Value Ref Range   Prothrombin Time 12.3 11.4 - 15.2 seconds   INR 0.92   APTT  Result Value Ref Range   aPTT 31 24 - 36 seconds  CBC  Result Value Ref Range   WBC 5.3 4.0 - 10.5 K/uL   RBC 4.39 3.87 - 5.11 MIL/uL   Hemoglobin 13.3 12.0 - 15.0 g/dL   HCT 40.8 36.0 - 46.0 %   MCV 92.9 80.0 - 100.0 fL   MCH 30.3 26.0 - 34.0 pg   MCHC 32.6 30.0 - 36.0 g/dL   RDW 12.1 11.5 - 15.5 %   Platelets 216 150 - 400 K/uL   nRBC 0.0 0.0 - 0.2 %  Differential  Result Value Ref Range   Neutrophils Relative % 56 %   Neutro Abs 2.9 1.7 - 7.7 K/uL   Lymphocytes Relative 32 %   Lymphs Abs  1.7 0.7 - 4.0 K/uL   Monocytes Relative 10 %   Monocytes Absolute 0.5 0.1 - 1.0 K/uL   Eosinophils Relative 2 %   Eosinophils Absolute 0.1 0.0 - 0.5 K/uL   Basophils Relative 0 %   Basophils Absolute 0.0 0.0 - 0.1 K/uL   Immature Granulocytes 0 %   Abs Immature Granulocytes 0.02 0.00 - 0.07 K/uL  Comprehensive metabolic panel  Result Value Ref Range   Sodium 141 135 - 145 mmol/L   Potassium 3.7 3.5 - 5.1 mmol/L   Chloride 105 98 - 111 mmol/L   CO2 25 22 - 32 mmol/L   Glucose, Bld 85 70 - 99 mg/dL   BUN 18 6 -  20 mg/dL   Creatinine, Ser 0.79 0.44 - 1.00 mg/dL   Calcium 8.9 8.9 - 10.3 mg/dL   Total Protein 6.9 6.5 - 8.1 g/dL   Albumin 4.1 3.5 - 5.0 g/dL   AST 19 15 - 41 U/L   ALT 14 0 - 44 U/L   Alkaline Phosphatase 51 38 - 126 U/L   Total Bilirubin 0.7 0.3 - 1.2 mg/dL   GFR calc non Af Amer >60 >60 mL/min   GFR calc Af Amer >60 >60 mL/min   Anion gap 11 5 - 15  Troponin I  Result Value Ref Range   Troponin I <0.03 <0.03 ng/mL  Glucose, capillary  Result Value Ref Range   Glucose-Capillary 101 (H) 70 - 99 mg/dL      Assessment & Plan:   Problem List Items Addressed This Visit      Other   Depression    Under good control on current regimen. Continue current regimen. Continue to monitor. Call with any concerns. Refills given.        Relevant Medications   DULoxetine (CYMBALTA) 60 MG capsule   Chronic pain of both knees    Using tramadol very occasionally. Rx given today. Call with any concerns.       Relevant Medications   DULoxetine (CYMBALTA) 60 MG capsule   traMADol (ULTRAM) 50 MG tablet    Other Visit Diagnoses    Breast pain, left    -  Primary   Will get Mammogram- ordered today. Call with any concerns.    Relevant Orders   CBC With Differential/Platelet   Comprehensive metabolic panel   MM DIAG BREAST TOMO BILATERAL   US BREAST LTD UNI LEFT INC AXILLA   US BREAST LTD UNI RIGHT INC AXILLA   Left axillary pain       +lymphadenopathy- CBC normal.  Will treat with 10 day course of amoxicillin and recheck 2-3 weeks.    Relevant Orders   CBC With Differential/Platelet   Comprehensive metabolic panel   MM DIAG BREAST TOMO BILATERAL   US BREAST LTD UNI LEFT INC AXILLA   US BREAST LTD UNI RIGHT INC AXILLA   Immunization due       Flu shot given today.   Relevant Orders   Flu Vaccine QUAD 6+ mos PF IM (Fluarix Quad PF) (Completed)       Follow up plan: Return 2-3 weeks, for follow up pain.

## 2018-05-02 NOTE — Assessment & Plan Note (Signed)
Using tramadol very occasionally. Rx given today. Call with any concerns.

## 2018-05-03 LAB — COMPREHENSIVE METABOLIC PANEL
ALBUMIN: 4.4 g/dL (ref 3.5–5.5)
ALK PHOS: 62 IU/L (ref 39–117)
ALT: 17 IU/L (ref 0–32)
AST: 18 IU/L (ref 0–40)
Albumin/Globulin Ratio: 1.9 (ref 1.2–2.2)
BILIRUBIN TOTAL: 0.2 mg/dL (ref 0.0–1.2)
BUN / CREAT RATIO: 19 (ref 9–23)
BUN: 18 mg/dL (ref 6–24)
CHLORIDE: 101 mmol/L (ref 96–106)
CO2: 25 mmol/L (ref 20–29)
Calcium: 9.4 mg/dL (ref 8.7–10.2)
Creatinine, Ser: 0.93 mg/dL (ref 0.57–1.00)
GFR calc Af Amer: 81 mL/min/{1.73_m2} (ref >59)
GFR calc non Af Amer: 70 mL/min/{1.73_m2} (ref >59)
Globulin, Total: 2.3 g/dL (ref 1.5–4.5)
Glucose: 85 mg/dL (ref 65–99)
Potassium: 4.7 mmol/L (ref 3.5–5.2)
Sodium: 140 mmol/L (ref 134–144)
Total Protein: 6.7 g/dL (ref 6.0–8.5)

## 2018-05-07 ENCOUNTER — Telehealth: Payer: Self-pay

## 2018-05-07 NOTE — Telephone Encounter (Signed)
PA for tramadol initiated via Cover My Meds.

## 2018-05-08 NOTE — Telephone Encounter (Signed)
PA approved.

## 2018-05-28 ENCOUNTER — Other Ambulatory Visit: Payer: Self-pay

## 2018-05-28 ENCOUNTER — Ambulatory Visit: Payer: BC Managed Care – PPO | Admitting: Family Medicine

## 2018-05-28 ENCOUNTER — Encounter: Payer: Self-pay | Admitting: Family Medicine

## 2018-05-28 VITALS — BP 121/82 | HR 77 | Temp 98.4°F | Wt 235.9 lb

## 2018-05-28 DIAGNOSIS — M79622 Pain in left upper arm: Secondary | ICD-10-CM | POA: Diagnosis not present

## 2018-05-28 DIAGNOSIS — G8929 Other chronic pain: Secondary | ICD-10-CM | POA: Diagnosis not present

## 2018-05-28 DIAGNOSIS — M25561 Pain in right knee: Secondary | ICD-10-CM

## 2018-05-28 DIAGNOSIS — M25562 Pain in left knee: Secondary | ICD-10-CM

## 2018-05-28 MED ORDER — TRAMADOL HCL 50 MG PO TABS: 50.0000 mg | ORAL_TABLET | Freq: Two times a day (BID) | ORAL | 0 refills | Status: DC | PRN

## 2018-05-28 MED ORDER — BUPROPION HCL ER (SR) 150 MG PO TB12: 150.0000 mg | ORAL_TABLET | Freq: Two times a day (BID) | ORAL | 1 refills | Status: DC

## 2018-05-28 MED ORDER — INSULIN PEN NEEDLE 31G X 6 MM MISC: 1.0000 | Freq: Every day | 12 refills | Status: AC

## 2018-05-28 MED ORDER — VERAPAMIL HCL ER 240 MG PO TBCR: 240.0000 mg | EXTENDED_RELEASE_TABLET | Freq: Every day | ORAL | 1 refills | Status: DC

## 2018-05-28 MED ORDER — GABAPENTIN 100 MG PO CAPS: ORAL_CAPSULE | ORAL | 3 refills | Status: DC

## 2018-05-28 NOTE — Assessment & Plan Note (Signed)
Using tramadol very occasionally, was not able to get her Rx last visit due to issues with insurance. Refill given today.

## 2018-05-28 NOTE — Progress Notes (Signed)
BP 121/82   Pulse 77   Temp 98.4 F (36.9 C) (Oral)   Wt 235 lb 14.4 oz (107 kg)   LMP 07/01/2013 Comment: tubal  SpO2 97%   BMI 40.49 kg/m    Subjective:    Patient ID: Shirley Rogers, female    DOB: Jan 14, 1966, 52 y.o.   MRN: [id number]  HPI: Shirley Rogers is a 52 y.o. female  Chief Complaint  Patient presents with  . Breast Pain  . Knee Pain   Still really sore under her L arm- has cut out caffeine without much of a difference. She notes that her pain may be a little better, but she is still pretty sore. She does not want to do x-rays at this time. She notes that she did well on gabapentin in the past and actually found it helpful for her headaches as well. She would like to restart it and if not getting better, would get x-rays. She notes that she was not able to get her tramadol last visit due to an issue with insurance. She would like a refill. She is otherwise doing well and happy to be losing weight. She feels better than she has in a long time. No other concerns or complaints at this time.  . Relevant past medical, surgical, family and social history reviewed and updated as indicated. Interim medical history since our last visit reviewed. Allergies and medications reviewed and updated.  Review of Systems  Constitutional: Negative.   Respiratory: Negative.   Cardiovascular: Negative.   Musculoskeletal: Positive for arthralgias, gait problem, joint swelling and myalgias. Negative for back pain, neck pain and neck stiffness.  Psychiatric/Behavioral: Negative.     Per HPI unless specifically indicated above     Objective:    BP 121/82   Pulse 77   Temp 98.4 F (36.9 C) (Oral)   Wt 235 lb 14.4 oz (107 kg)   LMP 07/01/2013 Comment: tubal  SpO2 97%   BMI 40.49 kg/m   Wt Readings from Last 3 Encounters:  05/28/18 235 lb 14.4 oz (107 kg)  05/02/18 235 lb 3 oz (106.7 kg)  02/22/18 238 lb 6.4 oz (108.1 kg)    Physical Exam Vitals signs and nursing note reviewed.   Constitutional:      General: She is not in acute distress.    Appearance: Normal appearance. She is not ill-appearing, toxic-appearing or diaphoretic.  HENT:     Head: Normocephalic and atraumatic.     Right Ear: External ear normal.     Left Ear: External ear normal.     Nose: Nose normal.     Mouth/Throat:     Mouth: Mucous membranes are moist.     Pharynx: Oropharynx is clear.  Eyes:     General: No scleral icterus.       Right eye: No discharge.        Left eye: No discharge.     Extraocular Movements: Extraocular movements intact.     Conjunctiva/sclera: Conjunctivae normal.     Pupils: Pupils are equal, round, and reactive to light.  Neck:     Musculoskeletal: Normal range of motion and neck supple.  Cardiovascular:     Rate and Rhythm: Normal rate and regular rhythm.     Pulses: Normal pulses.     Heart sounds: Normal heart sounds. No murmur. No friction rub. No gallop.   Pulmonary:     Effort: Pulmonary effort is normal. No respiratory distress.  Breath sounds: Normal breath sounds. No stridor. No wheezing, rhonchi or rales.  Chest:     Chest wall: No tenderness.  Musculoskeletal: Normal range of motion.  Skin:    General: Skin is warm and dry.     Capillary Refill: Capillary refill takes less than 2 seconds.     Coloration: Skin is not jaundiced or pale.     Findings: No bruising, erythema, lesion or rash.  Neurological:     General: No focal deficit present.     Mental Status: She is alert and oriented to person, place, and time. Mental status is at baseline.  Psychiatric:        Mood and Affect: Mood normal.        Behavior: Behavior normal.        Thought Content: Thought content normal.        Judgment: Judgment normal.     Results for orders placed or performed in visit on 05/02/18  CBC With Differential/Platelet  Result Value Ref Range   WBC 4.7 3.4 - 10.8 x10E3/uL   RBC 4.46 3.77 - 5.28 x10E6/uL   Hemoglobin 14.1 11.1 - 15.9 g/dL   Hematocrit  40.1 34.0 - 46.6 %   MCV 90 79 - 97 fL   MCH 31.6 26.6 - 33.0 pg   MCHC 35.2 31.5 - 35.7 g/dL   RDW 12.7 12.3 - 15.4 %   Platelets 211 150 - 450 x10E3/uL   Neutrophils 64 Not Estab. %   Lymphs 28 Not Estab. %   MID 8 Not Estab. %   Neutrophils Absolute 3.0 1.4 - 7.0 x10E3/uL   Lymphocytes Absolute 1.3 0.7 - 3.1 x10E3/uL   MID (Absolute) 0.4 0.1 - 1.6 X10E3/uL  Comprehensive metabolic panel  Result Value Ref Range   Glucose 85 65 - 99 mg/dL   BUN 18 6 - 24 mg/dL   Creatinine, Ser 0.93 0.57 - 1.00 mg/dL   GFR calc non Af Amer 70 >59 mL/min/1.73   GFR calc Af Amer 81 >59 mL/min/1.73   BUN/Creatinine Ratio 19 9 - 23   Sodium 140 134 - 144 mmol/L   Potassium 4.7 3.5 - 5.2 mmol/L   Chloride 101 96 - 106 mmol/L   CO2 25 20 - 29 mmol/L   Calcium 9.4 8.7 - 10.2 mg/dL   Total Protein 6.7 6.0 - 8.5 g/dL   Albumin 4.4 3.5 - 5.5 g/dL   Globulin, Total 2.3 1.5 - 4.5 g/dL   Albumin/Globulin Ratio 1.9 1.2 - 2.2   Bilirubin Total 0.2 0.0 - 1.2 mg/dL   Alkaline Phosphatase 62 39 - 117 IU/L   AST 18 0 - 40 IU/L   ALT 17 0 - 32 IU/L      Assessment & Plan:   Problem List Items Addressed This Visit      Other   Chronic pain of both knees    Using tramadol very occasionally, was not able to get her Rx last visit due to issues with insurance. Refill given today.       Relevant Medications   gabapentin (NEURONTIN) 100 MG capsule   buPROPion (WELLBUTRIN SR) 150 MG 12 hr tablet   traMADol (ULTRAM) 50 MG tablet    Other Visit Diagnoses    Left axillary pain    -  Primary   Lymphadenopathy resolved. Pain slightly better, but still there. Will start gabapentin and recheck in 1 month, if not better, will check x-ray of neck + thorax.  Follow up plan: Return in about 4 weeks (around 06/25/2018).

## 2018-06-17 ENCOUNTER — Other Ambulatory Visit: Payer: Self-pay | Admitting: Family Medicine

## 2018-06-18 NOTE — Telephone Encounter (Signed)
Called patient to let her know that Dr Wynetta Emery did not prescribe medicine and it may be eye doctor no answer left voicemail if she calls back please advise

## 2018-06-29 ENCOUNTER — Ambulatory Visit: Payer: BC Managed Care – PPO | Admitting: Family Medicine

## 2018-06-29 ENCOUNTER — Other Ambulatory Visit: Payer: Self-pay

## 2018-06-29 ENCOUNTER — Encounter: Payer: Self-pay | Admitting: Family Medicine

## 2018-06-29 VITALS — BP 125/83 | HR 78 | Temp 98.4°F | Ht 64.0 in | Wt 240.0 lb

## 2018-06-29 DIAGNOSIS — M25561 Pain in right knee: Secondary | ICD-10-CM

## 2018-06-29 DIAGNOSIS — M79622 Pain in left upper arm: Secondary | ICD-10-CM | POA: Diagnosis not present

## 2018-06-29 DIAGNOSIS — M4722 Other spondylosis with radiculopathy, cervical region: Secondary | ICD-10-CM | POA: Insufficient documentation

## 2018-06-29 DIAGNOSIS — M47812 Spondylosis without myelopathy or radiculopathy, cervical region: Secondary | ICD-10-CM

## 2018-06-29 DIAGNOSIS — F419 Anxiety disorder, unspecified: Secondary | ICD-10-CM

## 2018-06-29 DIAGNOSIS — G8929 Other chronic pain: Secondary | ICD-10-CM

## 2018-06-29 DIAGNOSIS — M25562 Pain in left knee: Secondary | ICD-10-CM

## 2018-06-29 MED ORDER — BUSPIRONE HCL 5 MG PO TABS: 5.0000 mg | ORAL_TABLET | Freq: Three times a day (TID) | ORAL | 3 refills | Status: DC

## 2018-06-29 MED ORDER — GABAPENTIN 300 MG PO CAPS: 300.0000 mg | ORAL_CAPSULE | Freq: Three times a day (TID) | ORAL | 3 refills | Status: DC

## 2018-06-29 MED ORDER — TRAMADOL HCL 50 MG PO TABS: 50.0000 mg | ORAL_TABLET | Freq: Two times a day (BID) | ORAL | 0 refills | Status: DC | PRN

## 2018-06-29 MED ORDER — TRAZODONE HCL 100 MG PO TABS: ORAL_TABLET | ORAL | 1 refills | Status: DC

## 2018-06-29 NOTE — Assessment & Plan Note (Signed)
Refill of her tramadol given today. Call with any concerns. Continue to monitor.

## 2018-06-29 NOTE — Assessment & Plan Note (Signed)
Has not been losing weight, but slowing down. Is down over all. Doing well on saxenda. Continue saxenda. Continue to monitor.

## 2018-06-29 NOTE — Progress Notes (Signed)
BP 125/83   Pulse 78   Temp 98.4 F (36.9 C) (Oral)   Ht 5\' 4"  (1.626 m)   Wt 240 lb (108.9 kg)   LMP 07/01/2013 Comment: tubal  SpO2 96%   BMI 41.20 kg/m    Subjective:    Patient ID: Shirley Rogers, female    DOB: Sep 03, 1965, 53 y.o.   MRN: [id number]  HPI: Shirley Rogers is a 53 y.o. female  Chief Complaint  Patient presents with  . Axillary pain   Doing better with her axilla pain. Now happening occasionally. Gabapentin really helping. Neck feeling very sore though and getting bad headaches. She notes that she has been taking 4 of her gabapentin at a time. Would like to go up on her dose. She had a CT done in 2018 which showed arthritis in her neck. Concerned that it's getting worse. Would like x-ray.   CHRONIC PAIN- Knees are about the same. Pain is doing OK.  Present dose:  10 Morphine equivalents Pain control status: stable Duration: chronic Location: bilateral knees Quality: aching and sore Current Pain Level: moderate Previous Pain Level: severe Breakthrough pain: no Benefit from narcotic medications: yes What Activities task can be accomplished with current medication? Able to work Interested in Careers adviser off narcotics:no   Stool softners/OTC fiber: no  Previous pain specialty evaluation: no Non-narcotic analgesic meds: yes Narcotic contract: yes  ANXIETY/STRESS- having panic attacks when she is driving into school in  Duration:exacerbated Anxious mood: yes  Excessive worrying: yes Irritability: yes  Sweating: no Nausea: no Palpitations:no Hyperventilation: no Panic attacks: yes Agoraphobia: no  Obscessions/compulsions: no Depressed mood: no Depression screen [facility] 2/9 05/02/2018 11/16/2017 04/04/2017 12/13/2016 10/06/2016  Decreased Interest 1 2 0 0 3  Down, Depressed, Hopeless 1 1 0 0 2  PHQ - 2 Score 2 3 0 0 5  Altered sleeping 2 0 - 1 2  Tired, decreased energy 2 3 - 0 3  Change in appetite 0 2 - 0 2  Feeling bad or failure about yourself  0 0 - 0 1    Trouble concentrating 2 0 - 0 2  Moving slowly or fidgety/restless 0 1 - 0 1  Suicidal thoughts 0 0 - 0 0  PHQ-9 Score 8 9 - 1 16  Difficult doing work/chores Not difficult at all Somewhat difficult - - -  Some recent data might be hidden   Anhedonia: no Weight changes: no Insomnia: no   Hypersomnia: no Fatigue/loss of energy: yes Feelings of worthlessness: no Feelings of guilt: no Impaired concentration/indecisiveness: no Suicidal ideations: no  Crying spells: no Recent Stressors/Life Changes: yes   Relationship problems: no   Family stress: yes     Financial stress: yes    Job stress: yes    Recent death/loss: no   Relevant past medical, surgical, family and social history reviewed and updated as indicated. Interim medical history since our last visit reviewed. Allergies and medications reviewed and updated.  Review of Systems  Constitutional: Negative.   Respiratory: Negative.   Cardiovascular: Negative.   Musculoskeletal: Positive for arthralgias, back pain and myalgias. Negative for gait problem, joint swelling, neck pain and neck stiffness.  Skin: Negative.   Neurological: Negative.   Psychiatric/Behavioral: Negative.     Per HPI unless specifically indicated above     Objective:    BP 125/83   Pulse 78   Temp 98.4 F (36.9 C) (Oral)   Ht 5\' 4"  (1.626 m)   Wt 240 lb (  108.9 kg)   LMP 07/01/2013 Comment: tubal  SpO2 96%   BMI 41.20 kg/m   Wt Readings from Last 3 Encounters:  06/29/18 240 lb (108.9 kg)  05/28/18 235 lb 14.4 oz (107 kg)  05/02/18 235 lb 3 oz (106.7 kg)    Physical Exam Vitals signs and nursing note reviewed.  Constitutional:      General: She is not in acute distress.    Appearance: Normal appearance. She is not ill-appearing, toxic-appearing or diaphoretic.  HENT:     Head: Normocephalic and atraumatic.     Right Ear: External ear normal.     Left Ear: External ear normal.     Nose: Nose normal.     Mouth/Throat:     Mouth:  Mucous membranes are moist.     Pharynx: Oropharynx is clear.  Eyes:     General: No scleral icterus.       Right eye: No discharge.        Left eye: No discharge.     Extraocular Movements: Extraocular movements intact.     Conjunctiva/sclera: Conjunctivae normal.     Pupils: Pupils are equal, round, and reactive to light.  Neck:     Musculoskeletal: Normal range of motion and neck supple.  Cardiovascular:     Rate and Rhythm: Normal rate and regular rhythm.     Pulses: Normal pulses.     Heart sounds: Normal heart sounds. No murmur. No friction rub. No gallop.   Pulmonary:     Effort: Pulmonary effort is normal. No respiratory distress.     Breath sounds: Normal breath sounds. No stridor. No wheezing, rhonchi or rales.  Chest:     Chest wall: No tenderness.  Musculoskeletal: Normal range of motion.  Skin:    General: Skin is warm and dry.     Capillary Refill: Capillary refill takes less than 2 seconds.     Coloration: Skin is not jaundiced or pale.     Findings: No bruising, erythema, lesion or rash.  Neurological:     General: No focal deficit present.     Mental Status: She is alert and oriented to person, place, and time. Mental status is at baseline.  Psychiatric:        Mood and Affect: Mood normal.        Behavior: Behavior normal.        Thought Content: Thought content normal.        Judgment: Judgment normal.     Results for orders placed or performed in visit on 05/02/18  CBC With Differential/Platelet  Result Value Ref Range   WBC 4.7 3.4 - 10.8 x10E3/uL   RBC 4.46 3.77 - 5.28 x10E6/uL   Hemoglobin 14.1 11.1 - 15.9 g/dL   Hematocrit 40.1 34.0 - 46.6 %   MCV 90 79 - 97 fL   MCH 31.6 26.6 - 33.0 pg   MCHC 35.2 31.5 - 35.7 g/dL   RDW 12.7 12.3 - 15.4 %   Platelets 211 150 - 450 x10E3/uL   Neutrophils 64 Not Estab. %   Lymphs 28 Not Estab. %   MID 8 Not Estab. %   Neutrophils Absolute 3.0 1.4 - 7.0 x10E3/uL   Lymphocytes Absolute 1.3 0.7 - 3.1 x10E3/uL     MID (Absolute) 0.4 0.1 - 1.6 X10E3/uL  Comprehensive metabolic panel  Result Value Ref Range   Glucose 85 65 - 99 mg/dL   BUN 18 6 - 24 mg/dL   Creatinine, Ser 0.93 0.57 -  1.00 mg/dL   GFR calc non Af Amer 70 >59 mL/min/1.73   GFR calc Af Amer 81 >59 mL/min/1.73   BUN/Creatinine Ratio 19 9 - 23   Sodium 140 134 - 144 mmol/L   Potassium 4.7 3.5 - 5.2 mmol/L   Chloride 101 96 - 106 mmol/L   CO2 25 20 - 29 mmol/L   Calcium 9.4 8.7 - 10.2 mg/dL   Total Protein 6.7 6.0 - 8.5 g/dL   Albumin 4.4 3.5 - 5.5 g/dL   Globulin, Total 2.3 1.5 - 4.5 g/dL   Albumin/Globulin Ratio 1.9 1.2 - 2.2   Bilirubin Total 0.2 0.0 - 1.2 mg/dL   Alkaline Phosphatase 62 39 - 117 IU/L   AST 18 0 - 40 IU/L   ALT 17 0 - 32 IU/L      Assessment & Plan:   Problem List Items Addressed This Visit      Musculoskeletal and Integument   Osteoarthritis of cervical spine - Primary    Neck doing worse. Will get her x-ray and increase gabapentin. Call with any concerns.       Relevant Medications   traMADol (ULTRAM) 50 MG tablet   Other Relevant Orders   DG Cervical Spine Complete     Other   Chronic pain of both knees    Refill of her tramadol given today. Call with any concerns. Continue to monitor.       Relevant Medications   gabapentin (NEURONTIN) 300 MG capsule   traMADol (ULTRAM) 50 MG tablet   traZODone (DESYREL) 100 MG tablet   Morbid obesity ([place])    Has not been losing weight, but slowing down. Is down over all. Doing well on saxenda. Continue saxenda. Continue to monitor.        Other Visit Diagnoses    Left axillary pain       Doing better with the gabapentin. Will increase to 300mg  TID and recheck 1 month. Call with any concerns. Will get neck x-ray.   Acute anxiety       Will add buspar and recheck 43month. Has been on cymbalta and wellbutrin. Call with any concerns.    Relevant Medications   traZODone (DESYREL) 100 MG tablet   busPIRone (BUSPAR) 5 MG tablet       Follow up  plan: Return in about 4 weeks (around 07/27/2018) for follow up mood and axillary pain.

## 2018-06-29 NOTE — Assessment & Plan Note (Signed)
Neck doing worse. Will get her x-ray and increase gabapentin. Call with any concerns.

## 2018-07-06 ENCOUNTER — Ambulatory Visit
Admission: RE | Admit: 2018-07-06 | Discharge: 2018-07-06 | Disposition: A | Discharge status: Home / Self Care | Payer: BC Managed Care – PPO | Source: Ambulatory Visit | Attending: Family Medicine | Admitting: Family Medicine

## 2018-07-06 DIAGNOSIS — M47812 Spondylosis without myelopathy or radiculopathy, cervical region: Secondary | ICD-10-CM | POA: Diagnosis not present

## 2018-07-09 ENCOUNTER — Other Ambulatory Visit: Payer: Self-pay | Admitting: Family Medicine

## 2018-07-09 DIAGNOSIS — M5030 Other cervical disc degeneration, unspecified cervical region: Secondary | ICD-10-CM

## 2018-07-09 NOTE — Progress Notes (Signed)
Mri neck

## 2018-07-21 ENCOUNTER — Ambulatory Visit
Admission: RE | Admit: 2018-07-21 | Discharge: 2018-07-21 | Disposition: A | Discharge status: Home / Self Care | Payer: BC Managed Care – PPO | Source: Ambulatory Visit | Attending: Family Medicine | Admitting: Family Medicine

## 2018-07-21 DIAGNOSIS — M5030 Other cervical disc degeneration, unspecified cervical region: Secondary | ICD-10-CM

## 2018-07-23 ENCOUNTER — Other Ambulatory Visit: Payer: Self-pay | Admitting: Family Medicine

## 2018-07-24 ENCOUNTER — Encounter: Payer: Self-pay | Admitting: Family Medicine

## 2018-07-24 DIAGNOSIS — M4722 Other spondylosis with radiculopathy, cervical region: Secondary | ICD-10-CM

## 2018-07-27 ENCOUNTER — Other Ambulatory Visit: Payer: Self-pay | Admitting: Student

## 2018-07-27 ENCOUNTER — Encounter: Payer: Self-pay | Admitting: Family Medicine

## 2018-07-27 ENCOUNTER — Other Ambulatory Visit: Payer: Self-pay

## 2018-07-27 ENCOUNTER — Ambulatory Visit: Payer: BC Managed Care – PPO | Admitting: Family Medicine

## 2018-07-27 VITALS — BP 132/88 | HR 82 | Temp 98.7°F | Ht 64.0 in | Wt 246.0 lb

## 2018-07-27 DIAGNOSIS — F331 Major depressive disorder, recurrent, moderate: Secondary | ICD-10-CM

## 2018-07-27 DIAGNOSIS — M4722 Other spondylosis with radiculopathy, cervical region: Secondary | ICD-10-CM

## 2018-07-27 DIAGNOSIS — M5442 Lumbago with sciatica, left side: Secondary | ICD-10-CM

## 2018-07-27 MED ORDER — TRAMADOL HCL 50 MG PO TABS: 50.0000 mg | ORAL_TABLET | Freq: Two times a day (BID) | ORAL | 0 refills | Status: AC

## 2018-07-27 MED ORDER — TRAMADOL HCL 50 MG PO TABS: 50.0000 mg | ORAL_TABLET | Freq: Two times a day (BID) | ORAL | 0 refills | Status: DC

## 2018-07-27 MED ORDER — TRAMADOL HCL 50 MG PO TABS: 50.0000 mg | ORAL_TABLET | Freq: Two times a day (BID) | ORAL | 0 refills | Status: AC | PRN

## 2018-07-27 NOTE — Progress Notes (Signed)
BP 132/88   Pulse 82   Temp 98.7 F (37.1 C) (Oral)   Ht 5\' 4"  (1.626 m)   Wt 246 lb (111.6 kg)   LMP 07/01/2013 Comment: tubal  SpO2 97%   BMI 42.23 kg/m    Subjective:    Patient ID: Shirley Rogers, female    DOB: 1965-11-05, 53 y.o.   MRN: [id number]  HPI: Shirley Rogers is a 53 y.o. female  Chief Complaint  Patient presents with  . Anxiety    f/u  . Axillary pain    left side   Saw neurosurgery today. To have MRI. On steroids and muscle relaxer. Gabapentin helps first thing in the AM, but then gets worse as the day goes on.  ANXIETY/STRESS Duration:better Anxious mood: yes  Excessive worrying: yes Irritability: no  Sweating: no Nausea: no Palpitations:no Hyperventilation: no Panic attacks: no Agoraphobia: no  Obscessions/compulsions: no Depressed mood: yes Depression screen [facility] 2/9 07/27/2018 05/02/2018 11/16/2017 04/04/2017 12/13/2016  Decreased Interest 2 1 2  0 0  Down, Depressed, Hopeless 0 1 1 0 0  PHQ - 2 Score 2 2 3  0 0  Altered sleeping 2 2 0 - 1  Tired, decreased energy 2 2 3  - 0  Change in appetite 2 0 2 - 0  Feeling bad or failure about yourself  0 0 0 - 0  Trouble concentrating 1 2 0 - 0  Moving slowly or fidgety/restless 0 0 1 - 0  Suicidal thoughts 0 0 0 - 0  PHQ-9 Score 9 8 9  - 1  Difficult doing work/chores Somewhat difficult Not difficult at all Somewhat difficult - -  Some recent data might be hidden   GAD 7 : Generalized Anxiety Score 07/27/2018 05/08/2015  Nervous, Anxious, on Edge 2 2  Control/stop worrying 1 0  Worry too much - different things 1 1  Trouble relaxing 2 2  Restless 1 1  Easily annoyed or irritable 1 1  Afraid - awful might happen 0 0  Total GAD 7 Score 8 7  Anxiety Difficulty Somewhat difficult Somewhat difficult   Anhedonia: no Weight changes: no Insomnia: no   Hypersomnia: yes Fatigue/loss of energy: yes Feelings of worthlessness: no Feelings of guilt: no Impaired concentration/indecisiveness: yes Suicidal  ideations: no  Crying spells: no Recent Stressors/Life Changes: no   Relationship problems: no   Family stress: no     Financial stress: no    Job stress: no    Recent death/loss: no    Relevant past medical, surgical, family and social history reviewed and updated as indicated. Interim medical history since our last visit reviewed. Allergies and medications reviewed and updated.  Review of Systems  Constitutional: Negative.   Respiratory: Negative.   Cardiovascular: Negative.   Musculoskeletal: Positive for back pain, myalgias, neck pain and neck stiffness. Negative for arthralgias, gait problem and joint swelling.  Skin: Negative.   Neurological: Positive for weakness and numbness. Negative for dizziness, tremors, seizures, syncope, facial asymmetry, speech difficulty, light-headedness and headaches.  Hematological: Negative.   Psychiatric/Behavioral: Positive for dysphoric mood. Negative for agitation, behavioral problems, confusion, decreased concentration, hallucinations, self-injury, sleep disturbance and suicidal ideas. The patient is nervous/anxious. The patient is not hyperactive.     Per HPI unless specifically indicated above     Objective:    BP 132/88   Pulse 82   Temp 98.7 F (37.1 C) (Oral)   Ht 5\' 4"  (1.626 m)   Wt 246 lb (111.6 kg)  LMP 07/01/2013 Comment: tubal  SpO2 97%   BMI 42.23 kg/m   Wt Readings from Last 3 Encounters:  07/27/18 246 lb (111.6 kg)  06/29/18 240 lb (108.9 kg)  05/28/18 235 lb 14.4 oz (107 kg)    Physical Exam Vitals signs and nursing note reviewed.  Constitutional:      General: She is not in acute distress.    Appearance: Normal appearance. She is not ill-appearing, toxic-appearing or diaphoretic.  HENT:     Head: Normocephalic and atraumatic.     Right Ear: External ear normal.     Left Ear: External ear normal.     Nose: Nose normal.     Mouth/Throat:     Mouth: Mucous membranes are moist.     Pharynx: Oropharynx is  clear.  Eyes:     General: No scleral icterus.       Right eye: No discharge.        Left eye: No discharge.     Extraocular Movements: Extraocular movements intact.     Conjunctiva/sclera: Conjunctivae normal.     Pupils: Pupils are equal, round, and reactive to light.  Neck:     Musculoskeletal: Normal range of motion and neck supple.  Cardiovascular:     Rate and Rhythm: Normal rate and regular rhythm.     Pulses: Normal pulses.     Heart sounds: Normal heart sounds. No murmur. No friction rub. No gallop.   Pulmonary:     Effort: Pulmonary effort is normal. No respiratory distress.     Breath sounds: Normal breath sounds. No stridor. No wheezing, rhonchi or rales.  Chest:     Chest wall: No tenderness.  Musculoskeletal: Normal range of motion.  Skin:    General: Skin is warm and dry.     Capillary Refill: Capillary refill takes less than 2 seconds.     Coloration: Skin is not jaundiced or pale.     Findings: No bruising, erythema, lesion or rash.  Neurological:     General: No focal deficit present.     Mental Status: She is alert and oriented to person, place, and time. Mental status is at baseline.  Psychiatric:        Mood and Affect: Mood normal.        Behavior: Behavior normal.        Thought Content: Thought content normal.        Judgment: Judgment normal.     Results for orders placed or performed in visit on 05/02/18  CBC With Differential/Platelet  Result Value Ref Range   WBC 4.7 3.4 - 10.8 x10E3/uL   RBC 4.46 3.77 - 5.28 x10E6/uL   Hemoglobin 14.1 11.1 - 15.9 g/dL   Hematocrit 40.1 34.0 - 46.6 %   MCV 90 79 - 97 fL   MCH 31.6 26.6 - 33.0 pg   MCHC 35.2 31.5 - 35.7 g/dL   RDW 12.7 12.3 - 15.4 %   Platelets 211 150 - 450 x10E3/uL   Neutrophils 64 Not Estab. %   Lymphs 28 Not Estab. %   MID 8 Not Estab. %   Neutrophils Absolute 3.0 1.4 - 7.0 x10E3/uL   Lymphocytes Absolute 1.3 0.7 - 3.1 x10E3/uL   MID (Absolute) 0.4 0.1 - 1.6 X10E3/uL  Comprehensive  metabolic panel  Result Value Ref Range   Glucose 85 65 - 99 mg/dL   BUN 18 6 - 24 mg/dL   Creatinine, Ser 0.93 0.57 - 1.00 mg/dL   GFR calc  non Af Amer 70 >59 mL/min/1.73   GFR calc Af Amer 81 >59 mL/min/1.73   BUN/Creatinine Ratio 19 9 - 23   Sodium 140 134 - 144 mmol/L   Potassium 4.7 3.5 - 5.2 mmol/L   Chloride 101 96 - 106 mmol/L   CO2 25 20 - 29 mmol/L   Calcium 9.4 8.7 - 10.2 mg/dL   Total Protein 6.7 6.0 - 8.5 g/dL   Albumin 4.4 3.5 - 5.5 g/dL   Globulin, Total 2.3 1.5 - 4.5 g/dL   Albumin/Globulin Ratio 1.9 1.2 - 2.2   Bilirubin Total 0.2 0.0 - 1.2 mg/dL   Alkaline Phosphatase 62 39 - 117 IU/L   AST 18 0 - 40 IU/L   ALT 17 0 - 32 IU/L      Assessment & Plan:   Problem List Items Addressed This Visit      Nervous and Auditory   Osteoarthritis of spine with radiculopathy, cervical region - Primary    Seeing neurosurgery. Will continue her on her tramadol for now, hopefully will need it less as neurosurgery works with her. Call with any concerns. Refills for 3 months given today. Recheck 3 months.       Relevant Medications   baclofen (LIORESAL) 10 MG tablet   methylPREDNISolone (MEDROL DOSEPAK) 4 MG TBPK tablet   traMADol (ULTRAM) 50 MG tablet   traMADol (ULTRAM) 50 MG tablet (Start on 08/24/2018)   traMADol (ULTRAM) 50 MG tablet (Start on 09/21/2018)     Other   Depression    Doing better on the buspar. Happy where she is. Will continue to monitor. Call with any concerns. Recheck 3 months.           Follow up plan: Return in about 12 weeks (around 10/19/2018).

## 2018-07-27 NOTE — Assessment & Plan Note (Signed)
Doing better on the buspar. Happy where she is. Will continue to monitor. Call with any concerns. Recheck 3 months.

## 2018-07-27 NOTE — Assessment & Plan Note (Signed)
Seeing neurosurgery. Will continue her on her tramadol for now, hopefully will need it less as neurosurgery works with her. Call with any concerns. Refills for 3 months given today. Recheck 3 months.

## 2018-07-30 ENCOUNTER — Other Ambulatory Visit: Payer: Self-pay | Admitting: Student

## 2018-07-30 DIAGNOSIS — M5412 Radiculopathy, cervical region: Secondary | ICD-10-CM

## 2018-08-04 ENCOUNTER — Other Ambulatory Visit: Payer: Self-pay

## 2018-08-04 ENCOUNTER — Ambulatory Visit
Admission: RE | Admit: 2018-08-04 | Discharge: 2018-08-04 | Disposition: A | Discharge status: Home / Self Care | Payer: BC Managed Care – PPO | Source: Ambulatory Visit | Attending: Student | Admitting: Student

## 2018-08-04 DIAGNOSIS — M5442 Lumbago with sciatica, left side: Secondary | ICD-10-CM | POA: Diagnosis not present

## 2018-08-14 ENCOUNTER — Other Ambulatory Visit: Payer: BC Managed Care – PPO

## 2018-08-15 ENCOUNTER — Ambulatory Visit
Admission: RE | Admit: 2018-08-15 | Discharge: 2018-08-15 | Disposition: A | Discharge status: Home / Self Care | Payer: BC Managed Care – PPO | Source: Ambulatory Visit | Attending: Student | Admitting: Student

## 2018-08-15 ENCOUNTER — Other Ambulatory Visit: Payer: Self-pay

## 2018-08-15 DIAGNOSIS — M5412 Radiculopathy, cervical region: Secondary | ICD-10-CM

## 2018-08-15 MED ORDER — TRIAMCINOLONE ACETONIDE 40 MG/ML IJ SUSP (RADIOLOGY)
60.0000 mg | Freq: Once | INTRAMUSCULAR | Status: AC
  Administered 2018-08-15: 60 mg via EPIDURAL

## 2018-08-15 MED ORDER — IOPAMIDOL (ISOVUE-M 300) INJECTION 61%
1.0000 mL | Freq: Once | INTRAMUSCULAR | Status: AC | PRN
  Administered 2018-08-15: 1 mL via EPIDURAL

## 2018-08-15 NOTE — Discharge Instructions (Signed)

## 2018-09-04 ENCOUNTER — Encounter: Payer: Self-pay | Admitting: Family Medicine

## 2018-09-06 ENCOUNTER — Other Ambulatory Visit: Payer: Self-pay | Admitting: Student

## 2018-09-06 DIAGNOSIS — M5412 Radiculopathy, cervical region: Secondary | ICD-10-CM

## 2018-10-25 ENCOUNTER — Encounter: Payer: Self-pay | Admitting: Family Medicine

## 2018-10-25 ENCOUNTER — Ambulatory Visit (INDEPENDENT_AMBULATORY_CARE_PROVIDER_SITE_OTHER): Payer: BC Managed Care – PPO | Admitting: Family Medicine

## 2018-10-25 ENCOUNTER — Other Ambulatory Visit: Payer: Self-pay

## 2018-10-25 DIAGNOSIS — M4722 Other spondylosis with radiculopathy, cervical region: Secondary | ICD-10-CM | POA: Diagnosis not present

## 2018-10-25 DIAGNOSIS — R102 Pelvic and perineal pain: Secondary | ICD-10-CM

## 2018-10-25 MED ORDER — TRAMADOL HCL 50 MG PO TABS: 50.0000 mg | ORAL_TABLET | Freq: Three times a day (TID) | ORAL | 0 refills | Status: AC | PRN

## 2018-10-25 MED ORDER — TRAZODONE HCL 100 MG PO TABS: ORAL_TABLET | ORAL | 1 refills | Status: DC

## 2018-10-25 MED ORDER — BUPROPION HCL ER (SR) 150 MG PO TB12: 150.0000 mg | ORAL_TABLET | Freq: Two times a day (BID) | ORAL | 1 refills | Status: DC

## 2018-10-25 MED ORDER — BUSPIRONE HCL 5 MG PO TABS: 5.0000 mg | ORAL_TABLET | Freq: Three times a day (TID) | ORAL | 3 refills | Status: DC

## 2018-10-25 MED ORDER — DULOXETINE HCL 60 MG PO CPEP: 60.0000 mg | ORAL_CAPSULE | Freq: Every day | ORAL | 1 refills | Status: DC

## 2018-10-25 MED ORDER — VERAPAMIL HCL ER 240 MG PO TBCR: 240.0000 mg | EXTENDED_RELEASE_TABLET | Freq: Every day | ORAL | 1 refills | Status: DC

## 2018-10-25 MED ORDER — LIRAGLUTIDE -WEIGHT MANAGEMENT 18 MG/3ML ~~LOC~~ SOPN: 3.0000 mg | PEN_INJECTOR | Freq: Every day | SUBCUTANEOUS | 1 refills | Status: DC

## 2018-10-25 MED ORDER — TRAMADOL HCL 50 MG PO TABS: 50.0000 mg | ORAL_TABLET | Freq: Two times a day (BID) | ORAL | 0 refills | Status: AC | PRN

## 2018-10-25 MED ORDER — FLUTICASONE FUROATE-VILANTEROL 200-25 MCG/INH IN AEPB: 1.0000 | INHALATION_SPRAY | Freq: Every day | RESPIRATORY_TRACT | 3 refills | Status: DC

## 2018-10-25 MED ORDER — TRAMADOL HCL 50 MG PO TABS: 50.0000 mg | ORAL_TABLET | Freq: Two times a day (BID) | ORAL | 0 refills | Status: AC

## 2018-10-25 MED ORDER — APIXABAN 5 MG PO TABS: ORAL_TABLET | ORAL | 3 refills | Status: DC

## 2018-10-25 NOTE — Progress Notes (Signed)
LMP 07/01/2013 Comment: tubal   Subjective:    Patient ID: Shirley Rogers, female    DOB: January 31, 1966, 53 y.o.   MRN: [id number]  HPI: Shirley Rogers is a 53 y.o. female  Chief Complaint  Patient presents with  . Neck Pain   Shirley Rogers presents today via virtual visit for follow up on her chronic pain. She is not doing great today. 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 This has been very uncomfortable. It has also made her very anxious. She has been having some suprapubic pressure and pain and is wondering if that is from the ulcers or if it is from the Bechets. She would like to come in to check on that. No blood in her urine. No increased frequency or urgency. She is otherwise doing OK with that.   CHRONIC PAIN- Had her first injection and it worked for about 3 days, has been doing PT- sometimes helping and sometimes not. Having another injection on 6/17.  Present dose: 15 Morphine equivalents Pain control status: stable Duration: chronic Location: neck and arm Quality: aching, sore, shooting, numb and tingling Current Pain Level: moderate Previous Pain Level: moderate Breakthrough pain: yes Benefit from narcotic medications: yes What Activities task can be accomplished with current medication? Able to work and do PT and do her ADLs Interested in weaning off narcotics:no- if shots work she would like to   Stool softners/OTC fiber: no  Previous pain specialty evaluation: yes Non-narcotic analgesic meds: yes Narcotic contract: yes  Relevant past medical, surgical, family and social history reviewed and updated as indicated. Interim medical history since our last visit reviewed. Allergies and medications reviewed and updated.  Review of Systems  Constitutional: Negative.   Respiratory: Negative.   Cardiovascular: Negative.   Gastrointestinal: Positive for abdominal pain. Negative for abdominal distention, anal bleeding, blood in stool,  constipation, diarrhea, nausea, rectal pain and vomiting.  Musculoskeletal: Positive for arthralgias, myalgias, neck pain and neck stiffness. Negative for back pain, gait problem and joint swelling.  Skin: Negative.   Neurological: Negative.   Psychiatric/Behavioral: Negative.     Per HPI unless specifically indicated above     Objective:    LMP 07/01/2013 Comment: tubal  Wt Readings from Last 3 Encounters:  07/27/18 246 lb (111.6 kg)  06/29/18 240 lb (108.9 kg)  05/28/18 235 lb 14.4 oz (107 kg)    Physical Exam Vitals signs and nursing note reviewed.  Constitutional:      General: She is not in acute distress.    Appearance: Normal appearance. She is not ill-appearing, toxic-appearing or diaphoretic.  HENT:     Head: Normocephalic and atraumatic.     Right Ear: External ear normal.     Left Ear: External ear normal.     Nose: Nose normal.     Mouth/Throat:     Mouth: Mucous membranes are moist.     Pharynx: Oropharynx is clear.  Eyes:     General: No scleral icterus.       Right eye: No discharge.        Left eye: No discharge.     Conjunctiva/sclera: Conjunctivae normal.     Pupils: Pupils are equal, round, and reactive to light.  Neck:     Musculoskeletal: Normal range of motion.  Pulmonary:     Effort: Pulmonary effort is normal. No respiratory distress.     Comments: Speaking in full sentences Musculoskeletal: Normal range of  motion.  Skin:    Coloration: Skin is not jaundiced or pale.     Findings: No bruising, erythema, lesion or rash.  Neurological:     Mental Status: She is alert and oriented to person, place, and time. Mental status is at baseline.  Psychiatric:        Mood and Affect: Mood normal.        Behavior: Behavior normal.        Thought Content: Thought content normal.        Judgment: Judgment normal.     Results for orders placed or performed in visit on 05/02/18  CBC With Differential/Platelet  Result Value Ref Range   WBC 4.7 3.4 -  10.8 x10E3/uL   RBC 4.46 3.77 - 5.28 x10E6/uL   Hemoglobin 14.1 11.1 - 15.9 g/dL   Hematocrit 40.1 34.0 - 46.6 %   MCV 90 79 - 97 fL   MCH 31.6 26.6 - 33.0 pg   MCHC 35.2 31.5 - 35.7 g/dL   RDW 12.7 12.3 - 15.4 %   Platelets 211 150 - 450 x10E3/uL   Neutrophils 64 Not Estab. %   Lymphs 28 Not Estab. %   MID 8 Not Estab. %   Neutrophils Absolute 3.0 1.4 - 7.0 x10E3/uL   Lymphocytes Absolute 1.3 0.7 - 3.1 x10E3/uL   MID (Absolute) 0.4 0.1 - 1.6 X10E3/uL  Comprehensive metabolic panel  Result Value Ref Range   Glucose 85 65 - 99 mg/dL   BUN 18 6 - 24 mg/dL   Creatinine, Ser 0.93 0.57 - 1.00 mg/dL   GFR calc non Af Amer 70 >59 mL/min/1.73   GFR calc Af Amer 81 >59 mL/min/1.73   BUN/Creatinine Ratio 19 9 - 23   Sodium 140 134 - 144 mmol/L   Potassium 4.7 3.5 - 5.2 mmol/L   Chloride 101 96 - 106 mmol/L   CO2 25 20 - 29 mmol/L   Calcium 9.4 8.7 - 10.2 mg/dL   Total Protein 6.7 6.0 - 8.5 g/dL   Albumin 4.4 3.5 - 5.5 g/dL   Globulin, Total 2.3 1.5 - 4.5 g/dL   Albumin/Globulin Ratio 1.9 1.2 - 2.2   Bilirubin Total 0.2 0.0 - 1.2 mg/dL   Alkaline Phosphatase 62 39 - 117 IU/L   AST 18 0 - 40 IU/L   ALT 17 0 - 32 IU/L      Assessment & Plan:   Problem List Items Addressed This Visit      Nervous and Auditory   Osteoarthritis of spine with radiculopathy, cervical region    Stable. Continue current regimen. Continue to monitor. Refills for 3 months given today. Call with any concerns.       Relevant Medications   hydroxychloroquine (PLAQUENIL) 200 MG tablet   traMADol (ULTRAM) 50 MG tablet   traMADol (ULTRAM) 50 MG tablet (Start on 11/22/2018)   traMADol (ULTRAM) 50 MG tablet (Start on 12/20/2018)   buPROPion (WELLBUTRIN SR) 150 MG 12 hr tablet   busPIRone (BUSPAR) 5 MG tablet   DULoxetine (CYMBALTA) 60 MG capsule   Liraglutide -Weight Management (SAXENDA) 18 MG/3ML SOPN   traZODone (DESYREL) 100 MG tablet    Other Visit Diagnoses    Suprapubic pain    -  Primary   Will  have her come in for wet prep and UA. Await results. Call with any concerns.    Relevant Orders   UA/M w/rflx Culture, Routine (Completed)   WET PREP FOR Shirley Rogers, Shirley Rogers, Shirley Rogers (Completed)  Follow up plan: Return in about 3 months (around 01/25/2019) for Physical.    . This visit was completed via [employer] due to the restrictions of the COVID-19 pandemic. All issues as above were discussed and addressed. Physical exam was done as above through visual confirmation on Doximity. If it was felt that the patient should be evaluated in the office, they were directed there. The patient verbally consented to this visit. . Location of the patient: home . Location of the provider: home . Those involved with this call:  . Provider: Park Liter, DO . CMA: Tiffany Reel, CMA . Front Desk/Registration: Don Perking  . Time spent on call: 25 minutes with patient face to face via video conference. More than 50% of this time was spent in counseling and coordination of care. 40 minutes total spent in review of patient's record and preparation of their chart.

## 2018-10-29 ENCOUNTER — Other Ambulatory Visit: Payer: Self-pay

## 2018-10-29 ENCOUNTER — Other Ambulatory Visit: Payer: BC Managed Care – PPO

## 2018-10-29 DIAGNOSIS — R102 Pelvic and perineal pain: Secondary | ICD-10-CM

## 2018-10-29 LAB — WET PREP FOR TRICH, YEAST, CLUE
Clue Cell Exam: POSITIVE — AB
Trichomonas Exam: NEGATIVE
Yeast Exam: NEGATIVE

## 2018-10-29 MED ORDER — METRONIDAZOLE 500 MG PO TABS: 500.0000 mg | ORAL_TABLET | Freq: Two times a day (BID) | ORAL | 0 refills | Status: DC

## 2018-10-31 LAB — MICROSCOPIC EXAMINATION

## 2018-10-31 LAB — UA/M W/RFLX CULTURE, ROUTINE
Bilirubin, UA: NEGATIVE
Glucose, UA: NEGATIVE
Ketones, UA: NEGATIVE
Leukocytes,UA: NEGATIVE
Nitrite, UA: NEGATIVE
Protein,UA: NEGATIVE
Specific Gravity, UA: >1.03 — ABNORMAL HIGH (ref 1.005–1.030)
Urobilinogen, Ur: 1 mg/dL (ref 0.2–1.0)
pH, UA: 6.5 (ref 5.0–7.5)

## 2018-10-31 LAB — URINE CULTURE, REFLEX

## 2018-10-31 NOTE — Assessment & Plan Note (Signed)
Stable. Continue current regimen. Continue to monitor. Refills for 3 months given today. Call with any concerns.

## 2018-11-02 ENCOUNTER — Encounter: Payer: Self-pay | Admitting: Family Medicine

## 2018-11-02 ENCOUNTER — Other Ambulatory Visit: Payer: Self-pay | Admitting: Family Medicine

## 2018-11-02 MED ORDER — PREDNISONE 50 MG PO TABS: 50.0000 mg | ORAL_TABLET | Freq: Every day | ORAL | 0 refills | Status: DC

## 2018-11-07 ENCOUNTER — Telehealth: Payer: Self-pay | Admitting: Orthopaedic Surgery

## 2018-11-07 NOTE — Telephone Encounter (Signed)
Message routed to Shirley Rogers.

## 2018-11-07 NOTE — Telephone Encounter (Signed)
Patient called asked if she can get the Synvisc One injection in both knees. The number to contact patient is [phone]

## 2018-11-09 ENCOUNTER — Telehealth: Payer: Self-pay

## 2018-11-09 NOTE — Telephone Encounter (Signed)
Noted  

## 2018-11-09 NOTE — Telephone Encounter (Signed)
Submitted VOB for SynviscOne, bilateral knee. 

## 2018-11-10 ENCOUNTER — Other Ambulatory Visit: Payer: Self-pay | Admitting: Family Medicine

## 2018-11-12 ENCOUNTER — Encounter: Payer: Self-pay | Admitting: Family Medicine

## 2018-11-12 ENCOUNTER — Telehealth: Payer: Self-pay

## 2018-11-12 DIAGNOSIS — N3001 Acute cystitis with hematuria: Secondary | ICD-10-CM

## 2018-11-12 MED ORDER — CIPROFLOXACIN HCL 500 MG PO TABS: 500.0000 mg | ORAL_TABLET | Freq: Two times a day (BID) | ORAL | 0 refills | Status: DC

## 2018-11-12 NOTE — Addendum Note (Signed)
Addended by: Valerie Roys on: 11/12/2018 11:24 AM   Modules accepted: Orders

## 2018-11-12 NOTE — Telephone Encounter (Signed)
PA required for SynviscOne, bilateral knee. Faxed completed PA form to [facility] at [phone],

## 2018-11-13 ENCOUNTER — Other Ambulatory Visit: Payer: Self-pay

## 2018-11-13 ENCOUNTER — Other Ambulatory Visit: Payer: BC Managed Care – PPO

## 2018-11-13 DIAGNOSIS — N3001 Acute cystitis with hematuria: Secondary | ICD-10-CM

## 2018-11-13 LAB — UA/M W/RFLX CULTURE, ROUTINE
Bilirubin, UA: NEGATIVE
Glucose, UA: NEGATIVE
Ketones, UA: NEGATIVE
Leukocytes,UA: NEGATIVE
Nitrite, UA: NEGATIVE
Protein,UA: NEGATIVE
Specific Gravity, UA: 1.02 (ref 1.005–1.030)
Urobilinogen, Ur: 0.2 mg/dL (ref 0.2–1.0)
pH, UA: 6.5 (ref 5.0–7.5)

## 2018-11-13 LAB — MICROSCOPIC EXAMINATION
Bacteria, UA: NONE SEEN
WBC, UA: NONE SEEN /hpf (ref 0–5)

## 2018-11-14 ENCOUNTER — Telehealth: Payer: Self-pay

## 2018-11-14 ENCOUNTER — Ambulatory Visit
Admission: RE | Admit: 2018-11-14 | Discharge: 2018-11-14 | Disposition: A | Discharge status: Home / Self Care | Payer: BC Managed Care – PPO | Source: Ambulatory Visit | Attending: Student | Admitting: Student

## 2018-11-14 DIAGNOSIS — M5412 Radiculopathy, cervical region: Secondary | ICD-10-CM

## 2018-11-14 MED ORDER — IOPAMIDOL (ISOVUE-M 300) INJECTION 61%
1.0000 mL | Freq: Once | INTRAMUSCULAR | Status: AC | PRN
  Administered 2018-11-14: 1 mL via EPIDURAL

## 2018-11-14 MED ORDER — TRIAMCINOLONE ACETONIDE 40 MG/ML IJ SUSP (RADIOLOGY)
60.0000 mg | Freq: Once | INTRAMUSCULAR | Status: AC
  Administered 2018-11-14: 08:00:00 60 mg via EPIDURAL

## 2018-11-14 NOTE — Discharge Instructions (Signed)

## 2018-11-14 NOTE — Telephone Encounter (Signed)
Called and left a VM for patient to CB and schedule an appointment with Dr. Erlinda Hong for gel injection.  Approved for SynviscOne, bilateral knee. [place] Covered at 100% after Co-pay Co-pay of $80.00 required PA required PA Approval # [id number] Valid 11/12/2018- 11/12/2019

## 2018-12-07 ENCOUNTER — Other Ambulatory Visit: Payer: Self-pay

## 2018-12-07 ENCOUNTER — Ambulatory Visit (INDEPENDENT_AMBULATORY_CARE_PROVIDER_SITE_OTHER): Payer: BC Managed Care – PPO | Admitting: Orthopaedic Surgery

## 2018-12-07 ENCOUNTER — Encounter: Payer: Self-pay | Admitting: Orthopaedic Surgery

## 2018-12-07 VITALS — Ht 64.0 in | Wt 246.0 lb

## 2018-12-07 DIAGNOSIS — M1711 Unilateral primary osteoarthritis, right knee: Secondary | ICD-10-CM

## 2018-12-07 DIAGNOSIS — M1712 Unilateral primary osteoarthritis, left knee: Secondary | ICD-10-CM

## 2018-12-07 MED ORDER — HYLAN G-F 20 48 MG/6ML IX SOSY
48.0000 mg | PREFILLED_SYRINGE | INTRA_ARTICULAR | Status: AC | PRN
  Administered 2018-12-07: 48 mg via INTRA_ARTICULAR

## 2018-12-07 MED ORDER — LIDOCAINE HCL 1 % IJ SOLN
5.0000 mL | INTRAMUSCULAR | Status: AC | PRN
  Administered 2018-12-07: 5 mL

## 2018-12-07 NOTE — Progress Notes (Signed)
   Procedure Note  Patient: Shirley Rogers             Date of Birth: July 24, 1965           MRN: [id number]             Visit Date: 12/07/2018  Procedures: Visit Diagnoses:  1. Unilateral primary osteoarthritis, left knee   2. Unilateral primary osteoarthritis, right knee     Large Joint Inj: bilateral knee on 12/07/2018 8:33 AM Indications: pain Details: 22 G needle, superolateral approach  Arthrogram: No  Medications (Right): 5 mL lidocaine 1 %; 48 mg Hylan 48 MG/6ML Medications (Left): 5 mL lidocaine 1 %; 48 mg Hylan 48 MG/6ML Outcome: tolerated well, no immediate complications Patient was prepped and draped in the usual sterile fashion.

## 2018-12-13 ENCOUNTER — Telehealth: Payer: Self-pay | Admitting: Family Medicine

## 2018-12-13 NOTE — Telephone Encounter (Signed)
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 Pt will need forms filled out if so. Please advise

## 2018-12-14 NOTE — Telephone Encounter (Signed)
She is. Form filled out.

## 2018-12-24 ENCOUNTER — Other Ambulatory Visit: Payer: Self-pay | Admitting: Family Medicine

## 2018-12-24 NOTE — Telephone Encounter (Signed)
Requested Prescriptions  Pending Prescriptions Disp Refills  . omeprazole (PRILOSEC) 20 MG capsule [Pharmacy Med Name: OMEPRAZOLE 20MG  CAPSULES] 90 capsule 0    Sig: TAKE 1 CAPSULE(20 MG) BY MOUTH AT BEDTIME     Gastroenterology: Proton Pump Inhibitors Passed - 12/24/2018 11:55 AM      Passed - Valid encounter within last 12 months    Recent Outpatient Visits          2 months ago Suprapubic pain   [place], Megan P, DO   5 months ago Osteoarthritis of spine with radiculopathy, cervical region   [facility], Megan P, DO   5 months ago Osteoarthritis of cervical spine, unspecified spinal osteoarthritis complication status   [place] P, DO   7 months ago Left axillary pain   [place], DO   7 months ago Breast pain, left   [place], [place], DO      Future Appointments            In 1 month Johnson, Barb Merino, DO [facility] [employer]

## 2018-12-28 ENCOUNTER — Encounter: Payer: BC Managed Care – PPO | Admitting: Family Medicine

## 2019-01-25 ENCOUNTER — Other Ambulatory Visit: Payer: Self-pay

## 2019-01-25 ENCOUNTER — Encounter: Payer: Self-pay | Admitting: Family Medicine

## 2019-01-25 ENCOUNTER — Ambulatory Visit (INDEPENDENT_AMBULATORY_CARE_PROVIDER_SITE_OTHER): Payer: BC Managed Care – PPO | Admitting: Family Medicine

## 2019-01-25 ENCOUNTER — Other Ambulatory Visit (HOSPITAL_COMMUNITY)
Admission: RE | Admit: 2019-01-25 | Discharge: 2019-01-25 | Disposition: A | Discharge status: Home / Self Care | Payer: BC Managed Care – PPO | Source: Ambulatory Visit | Attending: Family Medicine | Admitting: Family Medicine

## 2019-01-25 VITALS — BP 159/96 | HR 79 | Temp 98.6°F

## 2019-01-25 DIAGNOSIS — Z124 Encounter for screening for malignant neoplasm of cervix: Secondary | ICD-10-CM | POA: Diagnosis not present

## 2019-01-25 DIAGNOSIS — G8191 Hemiplegia, unspecified affecting right dominant side: Secondary | ICD-10-CM

## 2019-01-25 DIAGNOSIS — I129 Hypertensive chronic kidney disease with stage 1 through stage 4 chronic kidney disease, or unspecified chronic kidney disease: Secondary | ICD-10-CM

## 2019-01-25 DIAGNOSIS — R87810 Cervical high risk human papillomavirus (HPV) DNA test positive: Secondary | ICD-10-CM

## 2019-01-25 DIAGNOSIS — Z0000 Encounter for general adult medical examination without abnormal findings: Secondary | ICD-10-CM

## 2019-01-25 DIAGNOSIS — Z23 Encounter for immunization: Secondary | ICD-10-CM | POA: Diagnosis not present

## 2019-01-25 DIAGNOSIS — R3129 Other microscopic hematuria: Secondary | ICD-10-CM

## 2019-01-25 DIAGNOSIS — F331 Major depressive disorder, recurrent, moderate: Secondary | ICD-10-CM

## 2019-01-25 DIAGNOSIS — R519 Headache, unspecified: Secondary | ICD-10-CM

## 2019-01-25 DIAGNOSIS — M352 Behcet's disease: Secondary | ICD-10-CM | POA: Diagnosis not present

## 2019-01-25 DIAGNOSIS — G43409 Hemiplegic migraine, not intractable, without status migrainosus: Secondary | ICD-10-CM

## 2019-01-25 DIAGNOSIS — R0602 Shortness of breath: Secondary | ICD-10-CM

## 2019-01-25 DIAGNOSIS — R87610 Atypical squamous cells of undetermined significance on cytologic smear of cervix (ASC-US): Secondary | ICD-10-CM

## 2019-01-25 DIAGNOSIS — K22719 Barrett's esophagus with dysplasia, unspecified: Secondary | ICD-10-CM

## 2019-01-25 DIAGNOSIS — E041 Nontoxic single thyroid nodule: Secondary | ICD-10-CM | POA: Diagnosis not present

## 2019-01-25 DIAGNOSIS — M329 Systemic lupus erythematosus, unspecified: Secondary | ICD-10-CM

## 2019-01-25 DIAGNOSIS — R609 Edema, unspecified: Secondary | ICD-10-CM

## 2019-01-25 DIAGNOSIS — R51 Headache: Secondary | ICD-10-CM

## 2019-01-25 DIAGNOSIS — E8849 Other mitochondrial metabolism disorders: Secondary | ICD-10-CM

## 2019-01-25 LAB — UA/M W/RFLX CULTURE, ROUTINE
Bilirubin, UA: NEGATIVE
Glucose, UA: NEGATIVE
Ketones, UA: NEGATIVE
Leukocytes,UA: NEGATIVE
Nitrite, UA: NEGATIVE
Protein,UA: NEGATIVE
RBC, UA: NEGATIVE
Specific Gravity, UA: 1.025 (ref 1.005–1.030)
Urobilinogen, Ur: 1 mg/dL (ref 0.2–1.0)
pH, UA: 6.5 (ref 5.0–7.5)

## 2019-01-25 LAB — MICROALBUMIN, URINE WAIVED
Creatinine, Urine Waived: 50 mg/dL (ref 10–300)
Microalb, Ur Waived: 30 mg/L — ABNORMAL HIGH (ref 0–19)

## 2019-01-25 MED ORDER — BUPROPION HCL ER (SR) 200 MG PO TB12: 200.0000 mg | ORAL_TABLET | Freq: Two times a day (BID) | ORAL | 1 refills | Status: DC

## 2019-01-25 MED ORDER — BUSPIRONE HCL 5 MG PO TABS: 5.0000 mg | ORAL_TABLET | Freq: Three times a day (TID) | ORAL | 3 refills | Status: DC

## 2019-01-25 MED ORDER — GABAPENTIN 300 MG PO CAPS: 300.0000 mg | ORAL_CAPSULE | Freq: Three times a day (TID) | ORAL | 1 refills | Status: DC

## 2019-01-25 MED ORDER — FUROSEMIDE 20 MG PO TABS: ORAL_TABLET | ORAL | 1 refills | Status: DC

## 2019-01-25 MED ORDER — OMEPRAZOLE 20 MG PO CPDR: DELAYED_RELEASE_CAPSULE | ORAL | 1 refills | Status: DC

## 2019-01-25 MED ORDER — EPINEPHRINE 0.3 MG/0.3ML IJ SOAJ: INTRAMUSCULAR | 12 refills | Status: AC

## 2019-01-25 NOTE — Progress Notes (Signed)
BP (!) 159/96   Pulse 79   Temp 98.6 F (37 C)   LMP 07/01/2013 Comment: tubal  SpO2 97%    Subjective:    Patient ID: Shirley Rogers, female    DOB: 08/22/1965, 52 y.o.   MRN: [id number]  HPI: Shirley Rogers is a 53 y.o. female presenting on 01/25/2019 for comprehensive medical examination. Current medical complaints include:  Has not been feeling well. Has been having SOB and swelling in her L leg. She has been more tired as well. She has not missed any of her eliquis. She has been having headaches and is myalgias. She is concerned because she has been having headaches and has had 2 relatives die recently from ruptured aneurysms- cousins on both sides of her family. She would like to make sure she doesn't have one.   HYPERTENSION / HYPERLIPIDEMIA Satisfied with current treatment? yes Duration of hypertension: chronic BP monitoring frequency: not checking BP medication side effects: no Past BP meds: lasix, verapamil Duration of hyperlipidemia: chronic Cholesterol medication side effects: not on anything Cholesterol supplements: none Past cholesterol medications: none Medication compliance: excellent compliance Aspirin: no Recent stressors: yes Recurrent headaches: yes Visual changes: yes Palpitations: no Dyspnea: no Chest pain: no Lower extremity edema: yes Dizzy/lightheaded: yes   DEPRESSION Mood status: exacerbated Satisfied with current treatment?: no Symptom severity: moderate  Duration of current treatment : chronic Side effects: no Medication compliance: excellent compliance Psychotherapy/counseling: yes in the past Previous psychiatric medications: cymbalta, wellbutrin Depressed mood: yes Anxious mood: yes Anhedonia: no Significant weight loss or gain: yes Insomnia: yes hard to fall asleep Fatigue: yes Feelings of worthlessness or guilt: yes Impaired concentration/indecisiveness: yes Suicidal ideations: no Hopelessness: no Crying spells: yes  Depression screen [facility] 2/9 07/27/2018 05/02/2018 11/16/2017 04/04/2017 12/13/2016  Decreased Interest 2 1 2  0 0  Down, Depressed, Hopeless 0 1 1 0 0  PHQ - 2 Score 2 2 3  0 0  Altered sleeping 2 2 0 - 1  Tired, decreased energy 2 2 3  - 0  Change in appetite 2 0 2 - 0  Feeling bad or failure about yourself  0 0 0 - 0  Trouble concentrating 1 2 0 - 0  Moving slowly or fidgety/restless 0 0 1 - 0  Suicidal thoughts 0 0 0 - 0  PHQ-9 Score 9 8 9  - 1  Difficult doing work/chores Somewhat difficult Not difficult at all Somewhat difficult - -  Some recent data might be hidden    She currently lives with: room mate Menopausal Symptoms: no  Depression Screen done today and results listed below:  Depression screen [facility] 2/9 07/27/2018 05/02/2018 11/16/2017 04/04/2017 12/13/2016  Decreased Interest 2 1 2  0 0  Down, Depressed, Hopeless 0 1 1 0 0  PHQ - 2 Score 2 2 3  0 0  Altered sleeping 2 2 0 - 1  Tired, decreased energy 2 2 3  - 0  Change in appetite 2 0 2 - 0  Feeling bad or failure about yourself  0 0 0 - 0  Trouble concentrating 1 2 0 - 0  Moving slowly or fidgety/restless 0 0 1 - 0  Suicidal thoughts 0 0 0 - 0  PHQ-9 Score 9 8 9  - 1  Difficult doing work/chores Somewhat difficult Not difficult at all Somewhat difficult - -  Some recent data might be hidden     Past Medical History:  Past Medical History:  Diagnosis Date  . Anxiety    on  xanax in the past  . ASCVD (arteriosclerotic cardiovascular disease)    carotids  . Barrett esophagus   . 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 Carotid atherosclerosis 2003   on ultrasound  . Colon polyp   . Complicated migraine    with facial drooping  . Concussion    1974,1982,1987,2016,2016  . DDD (degenerative disc disease), lumbar    MRI 2008, L3-4, L5-S1, spondylotic changes, multilevel facet joint hypertrophic changes  . Depression    on paxil, lexapro, cymbalta in the past  . Diverticulosis   . Dizziness due to old head injury   . Family history of  adverse reaction to anesthesia    father - slow to wake  . Fatty liver 2013   On CT abdomen/pelvis  . Fibrocystic breast disease   . Fibromyalgia   . Fracture    left knee medial femoral condyle stress fracture, chondromalacia  . GERD (gastroesophageal reflux disease)   . Hemiplegic migraine    daily  . Hemorrhoids   . History of kidney stones   . Hyperlipidemia   . Hypertension    no meds currently.  . IBS (irritable bowel syndrome)   . Insomnia chronic  . Kidney stone 2013   on CT abdomen/pelvis  . Lupus ([place]) 1990   multiple plaques on MRI consistent with lupus vasculitis in 2003, question Behcet's   . Migraine   . Narrowing of intervertebral disc space 2011   C5-6  . Normal cardiac stress test 2009   EF 66%  . Pneumonia   . Pulmonary emboli ([place])   . Recurrent sinusitis   . Recurrent UTI    question about IC in the past  . Sleep apnea    CPAP machine broken  . Stroke ([place])    2000 - no deficits  . Umbilical hernia 0000000   on CT abdomen/pelvis  . Varicose veins    right lower leg  . Vascular spasm ([place])   . Vertigo    from concussive disorder.  seeing neuro 03/23/15    Surgical History:  Past Surgical History:  Procedure Laterality Date  . Biopsy Punch Thyroid    . BREAST EXCISIONAL BIOPSY Right 2000   right mass excision - atypical hyperplasia  . BREAST SURGERY Right    atypical hyperplasia  . CHOLECYSTECTOMY N/A 07/24/2015   Procedure: LAPAROSCOPIC CHOLECYSTECTOMY WITH INTRAOPERATIVE CHOLANGIOGRAM;  Surgeon: Leonie Green, MD;  Location: [facility] ORS;  Service: General;  Laterality: N/A;  . COLONOSCOPY W/ BIOPSIES     Removed 5 polyps  . COLONOSCOPY WITH PROPOFOL N/A 03/03/2017   Procedure: COLONOSCOPY WITH PROPOFOL;  Surgeon: [name], MD;  Location: [facility] ENDOSCOPY;  Service: Gastroenterology;  Laterality: N/A;  . COLONOSCOPY WITH PROPOFOL N/A 01/08/2018   Procedure: COLONOSCOPY WITH PROPOFOL;  Surgeon: [name], MD;  Location: [facility] ENDOSCOPY;   Service: Gastroenterology;  Laterality: N/A;  . ESOPHAGOGASTRODUODENOSCOPY (EGD) WITH PROPOFOL N/A 01/08/2018   Procedure: ESOPHAGOGASTRODUODENOSCOPY (EGD) WITH PROPOFOL;  Surgeon: [name], MD;  Location: [facility] ENDOSCOPY;  Service: Gastroenterology;  Laterality: N/A;  . KNEE ARTHROSCOPY WITH SUBCHONDROPLASTY Left 05/26/2017   Procedure: LEFT KNEE ARTHROSCOPY WITH SUBCHONDROPLASTY, CHONDROPLASTY, SYNOVECTOMY, POSSIBLE MICROFX;  Surgeon: Leandrew Koyanagi, MD;  Location: [place];  Service: Orthopedics;  Laterality: Left;  . TUBAL LIGATION      Medications:  Current Outpatient Medications on File Prior to Visit  Medication Sig  . apixaban (ELIQUIS) 5 MG TABS tablet TAKE 1 TABLET(5 MG) BY MOUTH TWICE DAILY  . b complex  vitamins tablet Take 1 tablet by mouth daily.  . calcium-vitamin D (OSCAL WITH D) 500-200 MG-UNIT tablet Take 1 tablet by mouth 3 (three) times daily.  . diphenhydrAMINE (BENADRYL) 25 mg capsule Take 2 capsules (50 mg total) by mouth every 6 (six) hours as needed. (Patient taking differently: Take 50 mg by mouth every 6 (six) hours as needed (for itching/allergies.). )  . DULoxetine (CYMBALTA) 60 MG capsule Take 1 capsule (60 mg total) by mouth daily.  . fluticasone furoate-vilanterol (BREO ELLIPTA) 200-25 MCG/INH AEPB Inhale 1 puff into the lungs daily.  . hydroxychloroquine (PLAQUENIL) 200 MG tablet Take by mouth.  . Insulin Pen Needle 31G X 6 MM MISC 1 each by Does not apply route daily.  Marland Kitchen linaclotide (LINZESS) 290 MCG CAPS capsule Take 1 capsule (290 mcg total) by mouth daily before breakfast.  . Liraglutide -Weight Management (SAXENDA) 18 MG/3ML SOPN Inject 3 mg into the skin daily.  . Magnesium 500 MG TABS Take 1,000 mg by mouth daily.   . methazolamide (NEPTAZANE) 25 MG tablet Take 1 tablet (25 mg total) by mouth 3 (three) times daily.  . traZODone (DESYREL) 100 MG tablet TAKE 1 TABLET(100 MG) BY MOUTH AT BEDTIME AS NEEDED FOR SLEEP  . verapamil (CALAN-SR) 240 MG CR tablet Take  1 tablet (240 mg total) by mouth daily.   Current Facility-Administered Medications on File Prior to Visit  Medication  . gadopentetate dimeglumine (MAGNEVIST) injection 20 mL    Allergies:  Allergies  Allergen Reactions  . Rocephin [Ceftriaxone] Anaphylaxis    Rocephin - throat swelling, rash  . Statins Anaphylaxis, Diarrhea and Nausea And Vomiting  . Sulfa Antibiotics Diarrhea, Nausea And Vomiting, Rash and Anaphylaxis    Bactrim - severe itching   . 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 Compazine [Prochlorperazine] Other (See Comments)    AKATHESIA  . Erythromycin Itching, Nausea And Vomiting and Rash    Patient states she is allergic to all mycin drugs  . Lidocaine Swelling    SWELLING REACTION UNSPECIFIED   . Meloxicam     UNSPECIFIED REACTION   . Nitrofuran Derivatives     biaxin - severe itching  . Ace Inhibitors Itching  . Biaxin [Clarithromycin] Itching, Nausea And Vomiting and Rash  . Doxycycline Nausea And Vomiting  . Latex Rash  . Morphine And Related Nausea And Vomiting    Roxanol - severe itching  . Sulfamethoxazole-Trimethoprim Diarrhea and Nausea And Vomiting    SULFONAMIDE ANTIBIOTICS     Social History:  Social History   Socioeconomic History  . Marital status: Divorced    Spouse name: Not on file  . Number of children: 1  . Years of education: 42  . 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 Financial resource strain: Not on file  . Food insecurity    Worry: Not on file    Inability: Not on file  . Transportation needs    Medical: Not on file    Non-medical: Not on file  Tobacco Use  . Smoking status: Current Some Day Smoker     Types: Cigarettes    Last attempt to quit: 06/12/2015    Years since  quitting: 3.6  . Smokeless tobacco: Never Used  . Tobacco comment: 1-2 cigarettes some days  Substance and Sexual Activity  . Alcohol use: No    Alcohol/week: 0.0 standard drinks  . Drug use: No  . Sexual activity: Never    Comment: tubal ligation and menopause  Lifestyle  . Physical activity    Days per week: Not on file    Minutes per session: Not on file  . Stress: Not on file  Relationships  . Social Herbalist on phone: Not on file    Gets together: Not on file    Attends religious service: Not on file    Active member of club or organization: Not on file    Attends meetings of clubs or organizations: Not on file    Relationship status: Not on file  . Intimate partner violence    Fear of current or ex partner: Not on file    Emotionally abused: Not on file    Physically abused: Not on file    Forced sexual activity: Not on file  Other Topics Concern  . Not on file  Social History Narrative   Lives at home with friend   Caffeine use- coffee 1 cup daily   Social History   Tobacco Use  Smoking Status Current Some Day Smoker  . Types: Cigarettes  . Last attempt to quit: 06/12/2015  . Years since quitting: 3.6  Smokeless Tobacco Never Used  Tobacco Comment   1-2 cigarettes some days   Social History   Substance and Sexual Activity  Alcohol Use No  . Alcohol/week: 0.0 standard drinks    Family History:  Family History  Problem Relation Age of Onset  . Other Mother        IBS  . Hypertension Mother   . Heart disease Father   . Diverticulosis Father   . Migraines Father   . Other Son        Ulcerative Colitis, Polonidal cyst  . Parkinson's disease Maternal Grandmother   . Cancer Maternal Grandmother   . Mental illness Paternal Grandmother   . Stroke Paternal Grandmother   . Cancer Paternal Grandmother        brain cancer  . Bipolar disorder Paternal Grandmother   . Stroke  Maternal Grandfather   . Cancer Maternal Grandfather        Bladder, Prostate/bladder/prostate  . Heart disease Paternal Grandfather   . Heart attack Paternal Grandfather   . Macular degeneration Other   . Diabetes Brother   . Breast cancer Maternal Aunt     Past medical history, surgical history, medications, allergies, family history and social history reviewed with patient today and changes made to appropriate areas of the chart.   Review of Systems  Constitutional: Positive for malaise/fatigue. Negative for chills, diaphoresis, fever and weight loss.  HENT: Positive for tinnitus. Negative for congestion, ear discharge, ear pain, hearing loss, nosebleeds, sinus pain and sore throat.   Eyes: Negative.   Respiratory: Positive for cough, shortness of breath and wheezing. Negative for hemoptysis, sputum production and stridor.   Cardiovascular: Positive for chest pain and leg swelling. Negative for orthopnea, claudication and PND.  Gastrointestinal: Positive for constipation, diarrhea and nausea. Negative for abdominal pain, blood in stool, heartburn, melena and vomiting.  Genitourinary: Negative.   Musculoskeletal: Positive for back pain, joint pain, myalgias (L leg) and neck pain. Negative for falls.  Skin: Negative.   Neurological: Positive for weakness and headaches. Negative for  dizziness, tingling, tremors, sensory change, speech change, focal weakness, seizures and loss of consciousness.  Endo/Heme/Allergies: Positive for polydipsia. Negative for environmental allergies. Bruises/bleeds easily.  Psychiatric/Behavioral: Positive for depression. Negative for hallucinations, memory loss, substance abuse and suicidal ideas. The patient is nervous/anxious. The patient does not have insomnia.     All other ROS negative except what is listed above and in the HPI.      Objective:    BP (!) 159/96   Pulse 79   Temp 98.6 F (37 C)   LMP 07/01/2013 Comment: tubal  SpO2 97%   Wt  Readings from Last 3 Encounters:  12/07/18 246 lb (111.6 kg)  07/27/18 246 lb (111.6 kg)  06/29/18 240 lb (108.9 kg)    Physical Exam Vitals signs and nursing note reviewed. Exam conducted with a chaperone present.  Constitutional:      General: She is not in acute distress.    Appearance: Normal appearance. She is not ill-appearing, toxic-appearing or diaphoretic.  HENT:     Head: Normocephalic and atraumatic.     Right Ear: Tympanic membrane, ear canal and external ear normal. There is no impacted cerumen.     Left Ear: Tympanic membrane, ear canal and external ear normal. There is no impacted cerumen.     Nose: Nose normal. No congestion or rhinorrhea.     Mouth/Throat:     Mouth: Mucous membranes are moist.     Pharynx: Oropharynx is clear. No oropharyngeal exudate or posterior oropharyngeal erythema.  Eyes:     General: No scleral icterus.       Right eye: No discharge.        Left eye: No discharge.     Extraocular Movements: Extraocular movements intact.     Conjunctiva/sclera: Conjunctivae normal.     Pupils: Pupils are equal, round, and reactive to light.  Neck:     Musculoskeletal: Normal range of motion and neck supple. No neck rigidity or muscular tenderness.     Vascular: No carotid bruit.  Cardiovascular:     Rate and Rhythm: Normal rate and regular rhythm.     Pulses: Normal pulses.     Heart sounds: No murmur. No friction rub. No gallop.   Pulmonary:     Effort: Pulmonary effort is normal. No respiratory distress.     Breath sounds: Normal breath sounds. No stridor. No wheezing, rhonchi or rales.  Chest:     Chest wall: No tenderness.     Breasts:        Right: Normal. No swelling, bleeding, inverted nipple, mass, nipple discharge, skin change or tenderness.        Left: Normal. No swelling, bleeding, inverted nipple, mass, nipple discharge, skin change or tenderness.  Abdominal:     General: Abdomen is flat. Bowel sounds are normal. There is no distension.      Palpations: Abdomen is soft. There is no mass.     Tenderness: There is no abdominal tenderness. There is no right CVA tenderness, left CVA tenderness, guarding or rebound.     Hernia: No hernia is present.  Genitourinary:    General: Normal vulva.     Labia:        Right: No rash, tenderness, lesion or injury.        Left: No rash, tenderness, lesion or injury.      Urethra: No prolapse, urethral pain, urethral swelling or urethral lesion.     Vagina: No vaginal discharge.     Cervix: No  cervical motion tenderness, discharge, friability, lesion, erythema, cervical bleeding or eversion.     Uterus: Normal.   Musculoskeletal:        General: No swelling, tenderness, deformity or signs of injury.     Right lower leg: No edema.     Left lower leg: No edema.  Lymphadenopathy:     Cervical: No cervical adenopathy.     Upper Body:     Right upper body: No supraclavicular, axillary or pectoral adenopathy.     Left upper body: No supraclavicular, axillary or pectoral adenopathy.  Skin:    General: Skin is warm and dry.     Capillary Refill: Capillary refill takes less than 2 seconds.     Coloration: Skin is not jaundiced or pale.     Findings: No bruising, erythema, lesion or rash.  Neurological:     General: No focal deficit present.     Mental Status: She is alert and oriented to person, place, and time. Mental status is at baseline.     Cranial Nerves: No cranial nerve deficit.     Sensory: No sensory deficit.     Motor: No weakness.     Coordination: Coordination normal.     Gait: Gait normal.     Deep Tendon Reflexes: Reflexes normal.  Psychiatric:        Mood and Affect: Mood normal.        Behavior: Behavior normal.        Thought Content: Thought content normal.        Judgment: Judgment normal.     Results for orders placed or performed in visit on 01/25/19  CBC with Differential/Platelet  Result Value Ref Range   WBC 6.4 3.4 - 10.8 x10E3/uL   RBC 4.65 3.77 - 5.28  x10E6/uL   Hemoglobin 14.4 11.1 - 15.9 g/dL   Hematocrit 41.1 34.0 - 46.6 %   MCV 88 79 - 97 fL   MCH 31.0 26.6 - 33.0 pg   MCHC 35.0 31.5 - 35.7 g/dL   RDW 12.3 11.7 - 15.4 %   Platelets 241 150 - 450 x10E3/uL   Neutrophils 60 Not Estab. %   Lymphs 29 Not Estab. %   Monocytes 8 Not Estab. %   Eos 2 Not Estab. %   Basos 1 Not Estab. %   Neutrophils Absolute 3.8 1.4 - 7.0 x10E3/uL   Lymphocytes Absolute 1.9 0.7 - 3.1 x10E3/uL   Monocytes Absolute 0.5 0.1 - 0.9 x10E3/uL   EOS (ABSOLUTE) 0.1 0.0 - 0.4 x10E3/uL   Basophils Absolute 0.0 0.0 - 0.2 x10E3/uL   Immature Granulocytes 0 Not Estab. %   Immature Grans (Abs) 0.0 0.0 - 0.1 x10E3/uL  Comprehensive metabolic panel  Result Value Ref Range   Glucose 75 65 - 99 mg/dL   BUN 12 6 - 24 mg/dL   Creatinine, Ser 0.80 0.57 - 1.00 mg/dL   GFR calc non Af Amer 84 >59 mL/min/1.73   GFR calc Af Amer 97 >59 mL/min/1.73   BUN/Creatinine Ratio 15 9 - 23   Sodium 139 134 - 144 mmol/L   Potassium 4.0 3.5 - 5.2 mmol/L   Chloride 99 96 - 106 mmol/L   CO2 27 20 - 29 mmol/L   Calcium 9.3 8.7 - 10.2 mg/dL   Total Protein 7.0 6.0 - 8.5 g/dL   Albumin 4.7 3.8 - 4.9 g/dL   Globulin, Total 2.3 1.5 - 4.5 g/dL   Albumin/Globulin Ratio 2.0 1.2 - 2.2   Bilirubin  Total 0.4 0.0 - 1.2 mg/dL   Alkaline Phosphatase 55 39 - 117 IU/L   AST 17 0 - 40 IU/L   ALT 11 0 - 32 IU/L  Lipid Panel w/o Chol/HDL Ratio  Result Value Ref Range   Cholesterol, Total 244 (H) 100 - 199 mg/dL   Triglycerides 100 0 - 149 mg/dL   HDL 61 >39 mg/dL   VLDL Cholesterol Cal 20 5 - 40 mg/dL   LDL Calculated 163 (H) 0 - 99 mg/dL  Microalbumin, Urine Waived  Result Value Ref Range   Microalb, Ur Waived 30 (H) 0 - 19 mg/L   Creatinine, Urine Waived 50 10 - 300 mg/dL   Microalb/Creat Ratio 30-300 (H) <30 mg/g  TSH  Result Value Ref Range   TSH 0.690 0.450 - 4.500 uIU/mL  UA/M w/rflx Culture, Routine   Specimen: Urine   URINE  Result Value Ref Range   Specific Gravity, UA  1.025 1.005 - 1.030   pH, UA 6.5 5.0 - 7.5   Color, UA Yellow Yellow   Appearance Ur Clear Clear   Leukocytes,UA Negative Negative   Protein,UA Negative Negative/Trace   Glucose, UA Negative Negative   Ketones, UA Negative Negative   RBC, UA Negative Negative   Bilirubin, UA Negative Negative   Urobilinogen, Ur 1.0 0.2 - 1.0 mg/dL   Nitrite, UA Negative Negative  D-Dimer, Quantitative  Result Value Ref Range   D-DIMER 0.41 0.00 - 0.49 mg/L FEU      Assessment & Plan:   Problem List Items Addressed This Visit      Cardiovascular and Mediastinum   Hemiplegic migraine   Relevant Medications   buPROPion (WELLBUTRIN SR) 200 MG 12 hr tablet   furosemide (LASIX) 20 MG tablet   EPINEPHrine 0.3 mg/0.3 mL IJ SOAJ injection   gabapentin (NEURONTIN) 300 MG capsule   Other Relevant Orders   MR Brain W Wo Contrast   Behcet's disease ([place])     Continue to follow with rheumatology. Feels like she is going into a flare. Will treat with prednisone and she will try to get in to see rheumatology earlier.       Relevant Medications   furosemide (LASIX) 20 MG tablet   EPINEPHrine 0.3 mg/0.3 mL IJ SOAJ injection   Other Relevant Orders   CBC with Differential/Platelet (Completed)   Comprehensive metabolic panel (Completed)     Digestive   Barrett esophagus    Under good control on current regimen. Continue current regimen. Continue to monitor. Call with any concerns. Refills given.        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 Await results.       Relevant Orders   CBC with Differential/Platelet (Completed)   Comprehensive metabolic panel (Completed)     Nervous and Auditory   Right hemiparesis (HCC)    Due to her migraines. Given family history of aneurysm, headaches, and complex migraine- will obtain MRI w/wo. Await results.  Continue to follow with neurology.      Relevant Orders   CBC with Differential/Platelet (Completed)   Comprehensive metabolic panel (Completed)   MR Brain W Wo Contrast     Genitourinary   Benign hypertensive renal disease    Elevated today. Will try to get pain and mood under better control and recheck in about 1 month. Call with  any concerns.       Relevant Orders   CBC with Differential/Platelet (Completed)   Comprehensive metabolic panel (Completed)   Microalbumin, Urine Waived (Completed)   TSH (Completed)   Microhematuria    Urine checked today. Await results. Call with any concerns.       Relevant Orders   CBC with Differential/Platelet (Completed)   Comprehensive metabolic panel (Completed)   UA/M w/rflx Culture, Routine (Completed)   ASCUS with positive high risk HPV cervical    Pap checked today. Await results.       Relevant Orders   CBC with Differential/Platelet (Completed)   Comprehensive metabolic panel (Completed)     Other   Lupus ([place])     Continue to follow with rheumatology. Feels like she is going into a flare. Will treat with prednisone and she will try to get in to see rheumatology earlier.       Relevant Orders   CBC with Differential/Platelet (Completed)   Comprehensive metabolic panel (Completed)   Depression    Not doing great. Will increase her wellbutrin and recheck 1 month. Call with any concerns.       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 Call with any concerns.       Relevant Orders   CBC with Differential/Platelet (Completed)   Comprehensive metabolic panel (Completed)   Morbid obesity ([place])    Restarted saxenda. Continue to monitor. Call with any concerns.       Relevant  Orders   CBC with Differential/Platelet (Completed)   Comprehensive metabolic panel (Completed)    Other Visit Diagnoses    Routine general medical examination at a health care facility    -  Primary   Vaccines updated. Screening labs checked today. Pap done. Continue diet and exercise. Mammogram ordered. Continue to monitor.    Relevant Orders   CBC with Differential/Platelet (Completed)   Comprehensive metabolic panel (Completed)   Lipid Panel w/o Chol/HDL Ratio (Completed)   Microalbumin, Urine Waived (Completed)   TSH (Completed)   UA/M w/rflx Culture, Routine (Completed)   Screening for cervical cancer       Pap done today.   Relevant Orders   Cytology - PAP   Immunization due       Flu shot given today.   Relevant Orders   Flu Vaccine QUAD 6+ mos PF IM (Fluarix Quad PF) (Completed)   Swelling       Likely due to heat and history of hemiplegia on that side- will check d-dimer and if positive, will get CTA given her SOB.    Relevant Orders   D-Dimer, Quantitative (Completed)   SOB (shortness of breath)       Likely due to heat and history of hemiplegia on that side- will check d-dimer and if positive, will get CTA given her SOB.    Relevant Orders   D-Dimer, Quantitative (Completed)   Nonintractable headache, unspecified chronicity pattern, unspecified headache type       Relevant Medications   buPROPion (WELLBUTRIN SR) 200 MG 12 hr tablet   gabapentin (NEURONTIN) 300 MG capsule   Other Relevant Orders   MR Brain W Wo Contrast       Follow up plan: Return in about 4 weeks (around  02/22/2019).   LABORATORY TESTING:  - Pap smear: pap done  IMMUNIZATIONS:   - Tdap: Tetanus vaccination status reviewed: last tetanus booster within 10 years. - Influenza: Administered today - Pneumovax: Up to date - Prevnar: Up to date  SCREENING: -Mammogram: Up to date  - Colonoscopy: Up to date  - Bone Density: Not applicable    PATIENT COUNSELING:   Advised to take 1 mg of  folate supplement per day if capable of pregnancy.   Sexuality: Discussed sexually transmitted diseases, partner selection, use of condoms, avoidance of unintended pregnancy  and contraceptive alternatives.   Advised to avoid cigarette smoking.  I discussed with the patient that most people either abstain from alcohol or drink within safe limits (<=14/week and <=4 drinks/occasion for males, <=7/weeks and <= 3 drinks/occasion for females) and that the risk for alcohol disorders and other health effects rises proportionally with the number of drinks per week and how often a drinker exceeds daily limits.  Discussed cessation/primary prevention of drug use and availability of treatment for abuse.   Diet: Encouraged to adjust caloric intake to maintain  or achieve ideal body weight, to reduce intake of dietary saturated fat and total fat, to limit sodium intake by avoiding high sodium foods and not adding table salt, and to maintain adequate dietary potassium and calcium preferably from fresh fruits, vegetables, and low-fat dairy products.    stressed the importance of regular exercise  Injury prevention: Discussed safety belts, safety helmets, smoke detector, smoking near bedding or upholstery.   Dental health: Discussed importance of regular tooth brushing, flossing, and dental visits.    NEXT PREVENTATIVE PHYSICAL DUE IN 1 YEAR. Return in about 4 weeks (around 02/22/2019).

## 2019-01-26 ENCOUNTER — Encounter: Payer: Self-pay | Admitting: Family Medicine

## 2019-01-26 LAB — CBC WITH DIFFERENTIAL/PLATELET
Basophils Absolute: 0 10*3/uL (ref 0.0–0.2)
Basos: 1 %
EOS (ABSOLUTE): 0.1 10*3/uL (ref 0.0–0.4)
Eos: 2 %
Hematocrit: 41.1 % (ref 34.0–46.6)
Hemoglobin: 14.4 g/dL (ref 11.1–15.9)
Immature Grans (Abs): 0 10*3/uL (ref 0.0–0.1)
Immature Granulocytes: 0 %
Lymphocytes Absolute: 1.9 10*3/uL (ref 0.7–3.1)
Lymphs: 29 %
MCH: 31 pg (ref 26.6–33.0)
MCHC: 35 g/dL (ref 31.5–35.7)
MCV: 88 fL (ref 79–97)
Monocytes Absolute: 0.5 10*3/uL (ref 0.1–0.9)
Monocytes: 8 %
Neutrophils Absolute: 3.8 10*3/uL (ref 1.4–7.0)
Neutrophils: 60 %
Platelets: 241 10*3/uL (ref 150–450)
RBC: 4.65 x10E6/uL (ref 3.77–5.28)
RDW: 12.3 % (ref 11.7–15.4)
WBC: 6.4 10*3/uL (ref 3.4–10.8)

## 2019-01-26 LAB — COMPREHENSIVE METABOLIC PANEL
ALT: 11 IU/L (ref 0–32)
AST: 17 IU/L (ref 0–40)
Albumin/Globulin Ratio: 2 (ref 1.2–2.2)
Albumin: 4.7 g/dL (ref 3.8–4.9)
Alkaline Phosphatase: 55 IU/L (ref 39–117)
BUN/Creatinine Ratio: 15 (ref 9–23)
BUN: 12 mg/dL (ref 6–24)
Bilirubin Total: 0.4 mg/dL (ref 0.0–1.2)
CO2: 27 mmol/L (ref 20–29)
Calcium: 9.3 mg/dL (ref 8.7–10.2)
Chloride: 99 mmol/L (ref 96–106)
Creatinine, Ser: 0.8 mg/dL (ref 0.57–1.00)
GFR calc Af Amer: 97 mL/min/{1.73_m2} (ref >59)
GFR calc non Af Amer: 84 mL/min/{1.73_m2} (ref >59)
Globulin, Total: 2.3 g/dL (ref 1.5–4.5)
Glucose: 75 mg/dL (ref 65–99)
Potassium: 4 mmol/L (ref 3.5–5.2)
Sodium: 139 mmol/L (ref 134–144)
Total Protein: 7 g/dL (ref 6.0–8.5)

## 2019-01-26 LAB — LIPID PANEL W/O CHOL/HDL RATIO
Cholesterol, Total: 244 mg/dL — ABNORMAL HIGH (ref 100–199)
HDL: 61 mg/dL (ref >39)
LDL Calculated: 163 mg/dL — ABNORMAL HIGH (ref 0–99)
Triglycerides: 100 mg/dL (ref 0–149)
VLDL Cholesterol Cal: 20 mg/dL (ref 5–40)

## 2019-01-26 LAB — D-DIMER, QUANTITATIVE: D-DIMER: 0.41 mg/L FEU (ref 0.00–0.49)

## 2019-01-26 LAB — TSH: TSH: 0.69 u[IU]/mL (ref 0.450–4.500)

## 2019-01-26 MED ORDER — PREDNISONE 10 MG PO TABS: ORAL_TABLET | ORAL | 0 refills | Status: DC

## 2019-01-26 NOTE — Assessment & Plan Note (Signed)
Continue to follow with neurology. Call with any concerns.

## 2019-01-26 NOTE — Assessment & Plan Note (Signed)
Under good control on current regimen. Continue current regimen. Continue to monitor. Call with any concerns. Refills given.   

## 2019-01-26 NOTE — Assessment & Plan Note (Signed)
Checking labs today. Await results.  

## 2019-01-26 NOTE — Assessment & Plan Note (Signed)
Urine checked today. Await results. Call with any concerns.

## 2019-01-26 NOTE — Assessment & Plan Note (Signed)
Pap checked today. Await results.

## 2019-01-26 NOTE — Assessment & Plan Note (Signed)
Restarted saxenda. Continue to monitor. Call with any concerns.

## 2019-01-26 NOTE — Assessment & Plan Note (Signed)
Continue to follow with rheumatology. Feels like she is going into a flare. Will treat with prednisone and she will try to get in to see rheumatology earlier.

## 2019-01-26 NOTE — Assessment & Plan Note (Signed)
Elevated today. Will try to get pain and mood under better control and recheck in about 1 month. Call with any concerns.

## 2019-01-26 NOTE — Assessment & Plan Note (Addendum)
Continue to follow with rheumatology. Feels like she is going into a flare. Will treat with prednisone and she will try to get in to see rheumatology earlier.

## 2019-01-26 NOTE — Assessment & Plan Note (Signed)
Not doing great. Will increase her wellbutrin and recheck 1 month. Call with any concerns.

## 2019-01-26 NOTE — Assessment & Plan Note (Signed)
Due to her migraines. Given family history of aneurysm, headaches, and complex migraine- will obtain MRI w/wo. Await results. Continue to follow with neurology.

## 2019-01-30 LAB — CYTOLOGY - PAP
Diagnosis: NEGATIVE
HPV: NOT DETECTED

## 2019-02-05 ENCOUNTER — Telehealth: Payer: Self-pay | Admitting: Family Medicine

## 2019-02-05 NOTE — Telephone Encounter (Signed)
Attempted to get authorization for brain mri.  Denied by [employer]/ Please call for peer to peer.  [phone] Plan number is [id number]

## 2019-02-05 NOTE — Telephone Encounter (Signed)
I do not have time to do this today- when does the case close, so I can make sure to call prior to that?

## 2019-02-06 NOTE — Telephone Encounter (Signed)
Case will close 4;00 PM central time 02/07/19

## 2019-02-06 NOTE — Telephone Encounter (Signed)
Case will close at 4:00 PM central time

## 2019-02-06 NOTE — Telephone Encounter (Signed)
Please let me know when this case is closing so I can call before it does.

## 2019-02-07 NOTE — Telephone Encounter (Signed)
Called for peer to peer- has already been approved MRI brain with and without  [id number] valid from 02/05/19-08/03/19  OK to get scheduled. Approved

## 2019-02-16 ENCOUNTER — Emergency Department
Admission: EM | Admit: 2019-02-16 | Discharge: 2019-02-16 | Disposition: A | Discharge status: Home / Self Care | Payer: BC Managed Care – PPO | Attending: Emergency Medicine | Admitting: Emergency Medicine

## 2019-02-16 ENCOUNTER — Emergency Department: Payer: BC Managed Care – PPO

## 2019-02-16 ENCOUNTER — Encounter: Payer: Self-pay | Admitting: Emergency Medicine

## 2019-02-16 ENCOUNTER — Other Ambulatory Visit: Payer: Self-pay

## 2019-02-16 DIAGNOSIS — S20211A Contusion of right front wall of thorax, initial encounter: Secondary | ICD-10-CM

## 2019-02-16 DIAGNOSIS — Y9289 Other specified places as the place of occurrence of the external cause: Secondary | ICD-10-CM | POA: Insufficient documentation

## 2019-02-16 DIAGNOSIS — I10 Essential (primary) hypertension: Secondary | ICD-10-CM | POA: Insufficient documentation

## 2019-02-16 DIAGNOSIS — Z7901 Long term (current) use of anticoagulants: Secondary | ICD-10-CM | POA: Diagnosis not present

## 2019-02-16 DIAGNOSIS — F17210 Nicotine dependence, cigarettes, uncomplicated: Secondary | ICD-10-CM | POA: Insufficient documentation

## 2019-02-16 DIAGNOSIS — Y999 Unspecified external cause status: Secondary | ICD-10-CM | POA: Insufficient documentation

## 2019-02-16 DIAGNOSIS — I2510 Atherosclerotic heart disease of native coronary artery without angina pectoris: Secondary | ICD-10-CM | POA: Diagnosis not present

## 2019-02-16 DIAGNOSIS — S299XXA Unspecified injury of thorax, initial encounter: Secondary | ICD-10-CM | POA: Diagnosis present

## 2019-02-16 DIAGNOSIS — W01198A Fall on same level from slipping, tripping and stumbling with subsequent striking against other object, initial encounter: Secondary | ICD-10-CM | POA: Insufficient documentation

## 2019-02-16 DIAGNOSIS — Y9389 Activity, other specified: Secondary | ICD-10-CM | POA: Diagnosis not present

## 2019-02-16 MED ORDER — METHOCARBAMOL 500 MG PO TABS: 500.0000 mg | ORAL_TABLET | Freq: Four times a day (QID) | ORAL | 0 refills | Status: DC | PRN

## 2019-02-16 MED ORDER — LIDOCAINE 5 % EX PTCH: MEDICATED_PATCH | CUTANEOUS | 0 refills | Status: DC

## 2019-02-16 MED ORDER — LIDOCAINE 5 % EX PTCH
1.0000 | MEDICATED_PATCH | Freq: Once | CUTANEOUS | Status: DC
  Administered 2019-02-16: 1 via TRANSDERMAL
  Filled 2019-02-16: qty 1

## 2019-02-16 MED ORDER — OXYCODONE-ACETAMINOPHEN 5-325 MG PO TABS: 1.0000 | ORAL_TABLET | Freq: Four times a day (QID) | ORAL | 0 refills | Status: DC | PRN

## 2019-02-16 MED ORDER — NAPROXEN 500 MG PO TABS: 500.0000 mg | ORAL_TABLET | Freq: Two times a day (BID) | ORAL | 0 refills | Status: DC

## 2019-02-16 NOTE — ED Provider Notes (Signed)
[facility] Emergency Department Provider Note  ____________________________________________   First MD Initiated Contact with Patient 02/16/19 1754     (approximate)  I have reviewed the triage vital signs and the nursing notes.   HISTORY  Chief Complaint Rib Injury   HPI Shirley Rogers is a 53 y.o. female resents to the ED with complaint of right posterior rib pain.  Patient states that she fell 2 weeks ago.  She states that she fell backwards and landed on some boxes.  She denies any head injury or loss of consciousness.  Patient states that she is been taking ibuprofen and Tylenol without any relief.  Patient denies any difficulty breathing.  She states that with range of motion pain is reproduced.  She rates her pain as a 10/10.      Past Medical History:  Diagnosis Date   Anxiety    on xanax in the past   ASCVD (arteriosclerotic cardiovascular disease)    carotids   Barrett esophagus    Behcet's syndrome (HCC)    Carotid atherosclerosis 2003   on ultrasound   Colon polyp    Complicated migraine    with facial drooping   Concussion    1974,1982,1987,2016,2016   DDD (degenerative disc disease), lumbar    MRI 2008, L3-4, L5-S1, spondylotic changes, multilevel facet joint hypertrophic changes   Depression    on paxil, lexapro, cymbalta in the past   Diverticulosis    Dizziness due to old head injury    Family history of adverse reaction to anesthesia    father - slow to wake   Fatty liver 2013   On CT abdomen/pelvis   Fibrocystic breast disease    Fibromyalgia    Fracture    left knee medial femoral condyle stress fracture, chondromalacia   GERD (gastroesophageal reflux disease)    Hemiplegic migraine    daily   Hemorrhoids    History of kidney stones    Hyperlipidemia    Hypertension    no meds currently.   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  seeing neuro 03/23/15    Patient Active Problem List   Diagnosis Date Noted   Osteoarthritis of spine with radiculopathy, cervical region 06/29/2018   Chronic pain of both knees 11/17/2017   Morbid obesity ([place]) 11/17/2017   Body mass index 40.0-44.9, adult ([place]) 11/17/2017   Unilateral primary osteoarthritis, right knee 07/11/2017   S/P left knee arthroscopy 06/06/2017   Stress fracture of femur 05/16/2017   Chondromalacia patellae, left knee 05/16/2017   Primary localized osteoarthritis of both knees 04/26/2017   ASCUS with positive high risk HPV cervical 01/18/2017  Increased intracranial pressure 10/06/2016   Vestibular hypofunction of left ear 09/21/2016   Intracranial hypertension, benign 09/21/2016   Chronic daily headache 09/21/2016   Inflammatory arthritis 08/28/2016   Nephrolithiasis 07/22/2016   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   Microhematuria 03/15/2016   Pelvic floor dysfunction 03/15/2016   Urethral caruncle 03/15/2016   History of pulmonary embolus (PE) 03/11/2016   Facial droop 02/04/2016   Right hemiparesis ([place]) 02/04/2016   Tobacco use 12/03/2015   ANA positive 10/19/2015   Transient alteration of awareness 09/24/2015   Snoring 06/10/2015   Sleep apnea 06/10/2015   Bile duct abnormality 05/12/2015   Solitary  nodule of right lobe of thyroid 04/14/2015   Globus sensation 03/30/2015   Post concussion syndrome 03/13/2015   Depression 03/13/2015   Benign hypertensive renal disease    Barrett esophagus    Diverticulosis    Hemiplegic migraine    Lupus ([place])    History of stroke     Past Surgical History:  Procedure Laterality Date   Biopsy Punch Thyroid     BREAST EXCISIONAL BIOPSY Right 2000   right mass excision - atypical hyperplasia   BREAST SURGERY Right    atypical hyperplasia   CHOLECYSTECTOMY N/A 07/24/2015   Procedure: LAPAROSCOPIC CHOLECYSTECTOMY WITH INTRAOPERATIVE CHOLANGIOGRAM;  Surgeon: Leonie Green, MD;  Location: [facility] ORS;  Service: General;  Laterality: N/A;   COLONOSCOPY W/ BIOPSIES     Removed 5 polyps   COLONOSCOPY WITH PROPOFOL N/A 03/03/2017   Procedure: COLONOSCOPY WITH PROPOFOL;  Surgeon: [name], MD;  Location: [facility] ENDOSCOPY;  Service: Gastroenterology;  Laterality: N/A;   COLONOSCOPY WITH PROPOFOL N/A 01/08/2018   Procedure: COLONOSCOPY WITH PROPOFOL;  Surgeon: [name], MD;  Location: [facility] ENDOSCOPY;  Service: Gastroenterology;  Laterality: N/A;   ESOPHAGOGASTRODUODENOSCOPY (EGD) WITH PROPOFOL N/A 01/08/2018   Procedure: ESOPHAGOGASTRODUODENOSCOPY (EGD) WITH PROPOFOL;  Surgeon: [name], MD;  Location: [facility] ENDOSCOPY;  Service: Gastroenterology;  Laterality: N/A;   KNEE ARTHROSCOPY WITH SUBCHONDROPLASTY Left 05/26/2017   Procedure: LEFT KNEE ARTHROSCOPY WITH SUBCHONDROPLASTY, CHONDROPLASTY, SYNOVECTOMY, POSSIBLE MICROFX;  Surgeon: Leandrew Koyanagi, MD;  Location: [place];  Service: Orthopedics;  Laterality: Left;   TUBAL LIGATION      Prior to Admission medications   Medication Sig Start Date End Date Taking? Authorizing Provider  apixaban (ELIQUIS) 5 MG TABS tablet TAKE 1 TABLET(5 MG) BY MOUTH TWICE DAILY 10/25/18   Park Liter P, DO  b complex vitamins tablet Take 1 tablet by mouth daily.    [provider]  buPROPion  (WELLBUTRIN SR) 200 MG 12 hr tablet Take 1 tablet (200 mg total) by mouth 2 (two) times daily. 01/25/19   Johnson, Megan P, DO  busPIRone (BUSPAR) 5 MG tablet Take 1-3 tablets (5-15 mg total) by mouth 3 (three) times daily. 01/25/19   Johnson, Megan P, DO  calcium-vitamin D (OSCAL WITH D) 500-200 MG-UNIT tablet Take 1 tablet by mouth 3 (three) times daily. 05/26/17   Leandrew Koyanagi, MD  diphenhydrAMINE (BENADRYL) 25 mg capsule Take 2 capsules (50 mg total) by mouth every 6 (six) hours as needed. Patient taking differently: Take 50 mg by mouth every 6 (six) hours as needed (for itching/allergies.).  08/02/16   Park Liter P, DO  DULoxetine (CYMBALTA) 60 MG capsule Take 1 capsule (60 mg total) by mouth daily. 10/25/18   Johnson, Megan P, DO  EPINEPHrine 0.3 mg/0.3 mL IJ SOAJ injection INJECT 0.3 ML( 0.3 MG) INTO THE MUSCLE ONCE  01/25/19   Johnson, Megan P, DO  fluticasone furoate-vilanterol (BREO ELLIPTA) 200-25 MCG/INH AEPB Inhale 1 puff into the lungs daily. 10/25/18   Johnson, Megan P, DO  furosemide (LASIX) 20 MG tablet TAKE 1 TABLET(20 MG) BY MOUTH DAILY 01/25/19   Johnson, Megan P, DO  gabapentin (NEURONTIN) 300 MG capsule Take 1 capsule (300 mg total) by mouth 3 (three) times daily. 01/25/19   Johnson, Megan P, DO  hydroxychloroquine (PLAQUENIL) 200 MG tablet Take by mouth. 10/01/18   [provider]  Insulin Pen Needle 31G X 6 MM MISC 1 each by Does not apply route daily. 05/28/18   Park Liter P, DO  lidocaine (LIDODERM) 5 % Remove & Discard patch within 12 hours or as directed by MD Apply to right posterior ribs 02/16/19   Johnn Hai, PA-C  linaclotide [facility]) 290 MCG CAPS capsule Take 1 capsule (290 mcg total) by mouth daily before breakfast. 01/10/18   [name], MD  Liraglutide -Weight Management (SAXENDA) 18 MG/3ML SOPN Inject 3 mg into the skin daily. 10/25/18   Johnson, Megan P, DO  Magnesium 500 MG TABS Take 1,000 mg by mouth daily.     [provider]    methazolamide (NEPTAZANE) 25 MG tablet Take 1 tablet (25 mg total) by mouth 3 (three) times daily. 04/26/17   Volney American, PA-C  methocarbamol (ROBAXIN) 500 MG tablet Take 1 tablet (500 mg total) by mouth every 6 (six) hours as needed for muscle spasms. 02/16/19   Johnn Hai, PA-C  naproxen (NAPROSYN) 500 MG tablet Take 1 tablet (500 mg total) by mouth 2 (two) times daily with a meal. 02/16/19   Johnn Hai, PA-C  omeprazole (PRILOSEC) 20 MG capsule TAKE 1 CAPSULE(20 MG) BY MOUTH AT BEDTIME 01/25/19   Johnson, Megan P, DO  oxyCODONE-acetaminophen (PERCOCET) 5-325 MG tablet Take 1 tablet by mouth every 6 (six) hours as needed for severe pain. 02/16/19   Johnn Hai, PA-C  predniSONE (DELTASONE) 10 MG tablet 6 tabs today and tomorrow 5 tabs the next 2 days, then decrease by 1 every other day until gone. 01/26/19   Johnson, Megan P, DO  traZODone (DESYREL) 100 MG tablet TAKE 1 TABLET(100 MG) BY MOUTH AT BEDTIME AS NEEDED FOR SLEEP 10/25/18   Johnson, Megan P, DO  verapamil (CALAN-SR) 240 MG CR tablet Take 1 tablet (240 mg total) by mouth daily. 10/25/18   Park Liter P, DO    Allergies Rocephin [ceftriaxone], Statins, Sulfa antibiotics, Valproate sodium, Compazine [prochlorperazine], Erythromycin, Lidocaine, Meloxicam, Nitrofuran derivatives, Ace inhibitors, Biaxin [clarithromycin], Doxycycline, Latex, Morphine and related, and Sulfamethoxazole-trimethoprim  Family History  Problem Relation Age of Onset   Other Mother        IBS   Hypertension Mother    Heart disease Father    Diverticulosis Father    Migraines Father    Other Son        Ulcerative Colitis, Polonidal cyst   Parkinson's disease Maternal Grandmother    Cancer Maternal Grandmother    Mental illness Paternal Grandmother    Stroke Paternal Grandmother    Cancer Paternal Grandmother        brain cancer   Bipolar disorder Paternal Grandmother    Stroke Maternal Grandfather    Cancer  Maternal Grandfather        Bladder, Prostate/bladder/prostate   Heart disease Paternal Grandfather    Heart attack Paternal Grandfather    Macular degeneration Other    Diabetes Brother  Breast cancer Maternal Aunt     Social History Social History   Tobacco Use   Smoking status: Current Some Day Smoker    Types: Cigarettes    Last attempt to quit: 06/12/2015    Years since quitting: 3.6   Smokeless tobacco: Never Used   Tobacco comment: 1-2 cigarettes some days  Substance Use Topics   Alcohol use: No    Alcohol/week: 0.0 standard drinks   Drug use: No    Review of Systems Constitutional: No fever/chills Eyes: No visual changes. Cardiovascular: Denies chest pain. Respiratory: Denies shortness of breath. Gastrointestinal: No abdominal pain.  No nausea, no vomiting.  Genitourinary: Negative for dysuria.  Negative for hematuria. Musculoskeletal: Negative for back pain.  Positive for right posterior rib pain. Skin: Negative for rash. Neurological: Negative for headaches, focal weakness or numbness. ___________________________________________   PHYSICAL EXAM:  VITAL SIGNS: ED Triage Vitals  Enc Vitals Group     BP 02/16/19 1730 (!) 153/90     Pulse Rate 02/16/19 1730 89     Resp 02/16/19 1730 16     Temp 02/16/19 1730 98.1 F (36.7 C)     Temp Source 02/16/19 1730 Oral     SpO2 02/16/19 1730 98 %     Weight 02/16/19 1730 252 lb (114.3 kg)     Height 02/16/19 1730 5\' 4"  (1.626 m)     Head Circumference --      Peak Flow --      Pain Score 02/16/19 1736 10     Pain Loc --      Pain Edu? --      Excl. in [place]? --    Constitutional: Alert and oriented. Well appearing and in no acute distress. Eyes: Conjunctivae are normal.  Head: Atraumatic. Neck: No stridor.   Cardiovascular: Normal rate, regular rhythm. Grossly normal heart sounds.  Good peripheral circulation. Respiratory: Normal respiratory effort.  No retractions. Lungs CTAB. Musculoskeletal:  Thoracic and lumbar spine is nontender to palpation.  On palpation of the lower posterior ribs there is pain.  No ecchymosis or abrasions were seen.  No deformity. Neurologic:  Normal speech and language. No gross focal neurologic deficits are appreciated. No gait instability.  Psychiatric: Mood and affect are normal. Speech and behavior are normal.  ____________________________________________   LABS (all labs ordered are listed, but only abnormal results are displayed)  Labs Reviewed - No data to display  RADIOLOGY   Official radiology report(s): Dg Ribs Unilateral W/chest Right  Result Date: 02/16/2019 CLINICAL DATA:  Fall, right posterior chest pain EXAM: RIGHT RIBS AND CHEST - 3+ VIEW COMPARISON:  None. FINDINGS: No fracture or other bone lesions are seen involving the ribs. There is no evidence of pneumothorax or pleural effusion. Both lungs are clear. Heart size and mediastinal contours are within normal limits. Surgical clips in the right upper quadrant. IMPRESSION: Negative. Electronically Signed   By: Prudencio Pair M.D.   On: 02/16/2019 18:16    ____________________________________________   PROCEDURES  Procedure(s) performed (including Critical Care):  Procedures   ____________________________________________   INITIAL IMPRESSION / ASSESSMENT AND PLAN / ED COURSE  As part of my medical decision making, I reviewed the following data within the electronic MEDICAL RECORD NUMBER Notes from prior ED visits and  Controlled Substance Database  53 year old female presents to the ED with complaint of right posterior rib pain after a fall that occurred 2 weeks ago.  Patient denies any difficulty breathing.  She reports that pain is increased  with range of motion.  She has been taking Tylenol and ibuprofen without any relief.  X-rays were reassuring and that there is no fracture seen.  Patient was made aware.  A Lidoderm patch was applied to her ribs prior to her discharge.   Lidocaine was listed as 1 of her allergies however patient states that she recently had 4 teeth pulled and had no side effects from the lidocaine that was used.  Patient also has been taking ibuprofen and has in the past taken Aleve without any difficulty.  History shows that she is allergic to meloxicam.  Patient was given a prescription for continued Lidoderm patches, methocarbamol 500 mg every 6 hours for muscle spasms and Percocet as needed for pain.  Patient was also given a prescription for naproxen 500 mg twice daily with food and she is to discontinue taking the ibuprofen at this time.  She was made aware that if there is no improvement or worsening of her symptoms she is to return to the emergency department.   ____________________________________________   FINAL CLINICAL IMPRESSION(S) / ED DIAGNOSES  Final diagnoses:  Contusion of ribs, right, initial encounter     ED Discharge Orders         Ordered    lidocaine (LIDODERM) 5 %  Status:  Discontinued     02/16/19 1859    methocarbamol (ROBAXIN) 500 MG tablet  Every 6 hours PRN     02/16/19 1859    oxyCODONE-acetaminophen (PERCOCET) 5-325 MG tablet  Every 6 hours PRN     02/16/19 1859    lidocaine (LIDODERM) 5 %     02/16/19 1900    naproxen (NAPROSYN) 500 MG tablet  2 times daily with meals     02/16/19 1902           Note:  This document was prepared using Dragon voice recognition software and may include unintentional dictation errors.    Johnn Hai, PA-C 02/16/19 1942    Blake Divine, MD 02/17/19 [phone]

## 2019-02-16 NOTE — ED Triage Notes (Signed)
2 weeks ago fell and dolly fell on her hitting R posterior chest. Rib pain since.

## 2019-02-16 NOTE — Discharge Instructions (Signed)
Follow-up with your primary care provider if any continued problems.  Use the Lidoderm patch every 12 hours to your right ribs where you are hurting it.  The methocarbamol is every 6 hours if needed for muscle spasms.  This medication cannot be taken while driving or operating machinery.  The oxycodone is for moderate to severe pain is 1 every 6 hours.  Do not take this medication and drive or operate machinery.  You may use ice to your back as needed for discomfort.  If any severe worsening of your symptoms return to the emergency department.

## 2019-03-03 ENCOUNTER — Ambulatory Visit: Payer: BC Managed Care – PPO

## 2019-03-14 ENCOUNTER — Other Ambulatory Visit: Payer: Self-pay | Admitting: Family Medicine

## 2019-03-22 ENCOUNTER — Encounter: Payer: Self-pay | Admitting: Family Medicine

## 2019-03-22 ENCOUNTER — Ambulatory Visit (INDEPENDENT_AMBULATORY_CARE_PROVIDER_SITE_OTHER): Payer: BC Managed Care – PPO | Admitting: Family Medicine

## 2019-03-22 ENCOUNTER — Other Ambulatory Visit: Payer: Self-pay

## 2019-03-22 VITALS — Ht 64.0 in | Wt 252.0 lb

## 2019-03-22 DIAGNOSIS — J069 Acute upper respiratory infection, unspecified: Secondary | ICD-10-CM | POA: Diagnosis not present

## 2019-03-22 DIAGNOSIS — Z20822 Contact with and (suspected) exposure to covid-19: Secondary | ICD-10-CM

## 2019-03-22 MED ORDER — PREDNISONE 50 MG PO TABS: 50.0000 mg | ORAL_TABLET | Freq: Every day | ORAL | 0 refills | Status: DC

## 2019-03-22 NOTE — Progress Notes (Signed)
Ht 5\' 4"  (1.626 m)   Wt 252 lb (114.3 kg)   LMP 07/01/2013 Comment: tubal  BMI 43.26 kg/m    Subjective:    Patient ID: Shirley Rogers, female    DOB: 1966/02/08, 53 y.o.   MRN: [id number]  HPI: Shirley Rogers is a 53 y.o. female  Chief Complaint  Patient presents with  . Fever    yeaterday 100.7  . Chills    symptoms started 5 days ago  . Headache  . Fatigue  . Sore Throat   UPPER RESPIRATORY TRACT INFECTION- had an exposure through her co-worker's daughter Duration: Monday Worst symptom: fatigue, itchy eyes, bad headaches Fever: yes Cough: yes Shortness of breath: yes Wheezing: yes Chest pain: no Chest tightness: no Chest congestion: no Nasal congestion: yes Runny nose: yes Post nasal drip: no Sneezing: yes Sore throat: yes Swollen glands: no Sinus pressure: no Headache: yes Face pain: no Toothache: no Ear pain: no  Ear pressure: no  Eyes red/itching:yes Eye drainage/crusting: yes  Vomiting: no Rash: no Fatigue: yes Sick contacts: yes Strep contacts: no  Context: worse Recurrent sinusitis: no Relief with OTC cold/cough medications: no  Treatments attempted: cold/sinus, mucinex, anti-histamine and pseudoephedrine   Relevant past medical, surgical, family and social history reviewed and updated as indicated. Interim medical history since our last visit reviewed. Allergies and medications reviewed and updated.  Review of Systems  Constitutional: Positive for fatigue and fever. Negative for activity change, appetite change, chills, diaphoresis and unexpected weight change.  HENT: Positive for congestion, postnasal drip, rhinorrhea and sore throat. Negative for dental problem, drooling, ear discharge, ear pain, facial swelling, hearing loss, mouth sores, nosebleeds, sinus pressure, sinus pain, sneezing, tinnitus, trouble swallowing and voice change.   Eyes: Negative.   Respiratory: Positive for cough, shortness of breath and wheezing. Negative for apnea,  choking, chest tightness and stridor.   Cardiovascular: Negative.   Gastrointestinal: Negative.   Psychiatric/Behavioral: Negative.     Per HPI unless specifically indicated above     Objective:    Ht 5\' 4"  (1.626 m)   Wt 252 lb (114.3 kg)   LMP 07/01/2013 Comment: tubal  BMI 43.26 kg/m   Wt Readings from Last 3 Encounters:  03/22/19 252 lb (114.3 kg)  02/16/19 252 lb (114.3 kg)  12/07/18 246 lb (111.6 kg)    Physical Exam Vitals signs and nursing note reviewed.  Constitutional:      General: She is not in acute distress.    Appearance: Normal appearance. She is not ill-appearing, toxic-appearing or diaphoretic.  HENT:     Head: Normocephalic and atraumatic.     Right Ear: External ear normal.     Left Ear: External ear normal.     Nose: Nose normal.     Mouth/Throat:     Mouth: Mucous membranes are moist.     Pharynx: Oropharynx is clear.  Eyes:     General: No scleral icterus.       Right eye: No discharge.        Left eye: No discharge.     Conjunctiva/sclera: Conjunctivae normal.     Pupils: Pupils are equal, round, and reactive to light.  Neck:     Musculoskeletal: Normal range of motion.  Pulmonary:     Effort: Pulmonary effort is normal. No respiratory distress.     Comments: Speaking in full sentences Musculoskeletal: Normal range of motion.  Skin:    Coloration: Skin is not jaundiced or pale.  Findings: No bruising, erythema, lesion or rash.  Neurological:     Mental Status: She is alert and oriented to person, place, and time. Mental status is at baseline.  Psychiatric:        Mood and Affect: Mood normal.        Behavior: Behavior normal.        Thought Content: Thought content normal.        Judgment: Judgment normal.     Results for orders placed or performed in visit on 01/25/19  CBC with Differential/Platelet  Result Value Ref Range   WBC 6.4 3.4 - 10.8 x10E3/uL   RBC 4.65 3.77 - 5.28 x10E6/uL   Hemoglobin 14.4 11.1 - 15.9 g/dL    Hematocrit 41.1 34.0 - 46.6 %   MCV 88 79 - 97 fL   MCH 31.0 26.6 - 33.0 pg   MCHC 35.0 31.5 - 35.7 g/dL   RDW 12.3 11.7 - 15.4 %   Platelets 241 150 - 450 x10E3/uL   Neutrophils 60 Not Estab. %   Lymphs 29 Not Estab. %   Monocytes 8 Not Estab. %   Eos 2 Not Estab. %   Basos 1 Not Estab. %   Neutrophils Absolute 3.8 1.4 - 7.0 x10E3/uL   Lymphocytes Absolute 1.9 0.7 - 3.1 x10E3/uL   Monocytes Absolute 0.5 0.1 - 0.9 x10E3/uL   EOS (ABSOLUTE) 0.1 0.0 - 0.4 x10E3/uL   Basophils Absolute 0.0 0.0 - 0.2 x10E3/uL   Immature Granulocytes 0 Not Estab. %   Immature Grans (Abs) 0.0 0.0 - 0.1 x10E3/uL  Comprehensive metabolic panel  Result Value Ref Range   Glucose 75 65 - 99 mg/dL   BUN 12 6 - 24 mg/dL   Creatinine, Ser 0.80 0.57 - 1.00 mg/dL   GFR calc non Af Amer 84 >59 mL/min/1.73   GFR calc Af Amer 97 >59 mL/min/1.73   BUN/Creatinine Ratio 15 9 - 23   Sodium 139 134 - 144 mmol/L   Potassium 4.0 3.5 - 5.2 mmol/L   Chloride 99 96 - 106 mmol/L   CO2 27 20 - 29 mmol/L   Calcium 9.3 8.7 - 10.2 mg/dL   Total Protein 7.0 6.0 - 8.5 g/dL   Albumin 4.7 3.8 - 4.9 g/dL   Globulin, Total 2.3 1.5 - 4.5 g/dL   Albumin/Globulin Ratio 2.0 1.2 - 2.2   Bilirubin Total 0.4 0.0 - 1.2 mg/dL   Alkaline Phosphatase 55 39 - 117 IU/L   AST 17 0 - 40 IU/L   ALT 11 0 - 32 IU/L  Lipid Panel w/o Chol/HDL Ratio  Result Value Ref Range   Cholesterol, Total 244 (H) 100 - 199 mg/dL   Triglycerides 100 0 - 149 mg/dL   HDL 61 >39 mg/dL   VLDL Cholesterol Cal 20 5 - 40 mg/dL   LDL Calculated 163 (H) 0 - 99 mg/dL  Microalbumin, Urine Waived  Result Value Ref Range   Microalb, Ur Waived 30 (H) 0 - 19 mg/L   Creatinine, Urine Waived 50 10 - 300 mg/dL   Microalb/Creat Ratio 30-300 (H) <30 mg/g  TSH  Result Value Ref Range   TSH 0.690 0.450 - 4.500 uIU/mL  UA/M w/rflx Culture, Routine   Specimen: Urine   URINE  Result Value Ref Range   Specific Gravity, UA 1.025 1.005 - 1.030   pH, UA 6.5 5.0 - 7.5    Color, UA Yellow Yellow   Appearance Ur Clear Clear   Leukocytes,UA Negative Negative  Protein,UA Negative Negative/Trace   Glucose, UA Negative Negative   Ketones, UA Negative Negative   RBC, UA Negative Negative   Bilirubin, UA Negative Negative   Urobilinogen, Ur 1.0 0.2 - 1.0 mg/dL   Nitrite, UA Negative Negative  D-Dimer, Quantitative  Result Value Ref Range   D-DIMER 0.41 0.00 - 0.49 mg/L FEU  Cytology - PAP  Result Value Ref Range   Adequacy      Satisfactory for evaluation  endocervical/transformation zone component PRESENT.   Diagnosis      NEGATIVE FOR INTRAEPITHELIAL LESIONS OR MALIGNANCY. BENIGN REACTIVE/REPARATIVE CHANGES.   HPV NOT Detected    Material Submitted CervicoVaginal Pap [ThinPrep Imaged]       Assessment & Plan:   Problem List Items Addressed This Visit    None    Visit Diagnoses    Upper respiratory tract infection, unspecified type    -  Primary   COVID tested this AM. Will self-quarantine until results/no fever for >72 hours. Will treat with burst of prenisone. Call if not getting better or getting worse       Follow up plan: Return if symptoms worsen or fail to improve.    . This visit was completed via Doximity due to the restrictions of the COVID-19 pandemic. All issues as above were discussed and addressed. Physical exam was done as above through visual confirmation on Doximity. If it was felt that the patient should be evaluated in the office, they were directed there. The patient verbally consented to this visit. . Location of the patient: home . Location of the provider: work . Those involved with this call:  . Provider: Park Liter, DO . CMA: Lesle Chris, [place] . Front Desk/Registration: Don Perking  . Time spent on call: 15 minutes with patient face to face via video conference. More than 50% of this time was spent in counseling and coordination of care. 23 minutes total spent in review of patient's record and preparation of  their chart.

## 2019-03-24 LAB — NOVEL CORONAVIRUS, NAA: SARS-CoV-2, NAA: NOT DETECTED

## 2019-04-16 ENCOUNTER — Other Ambulatory Visit: Payer: Self-pay | Admitting: Family Medicine

## 2019-04-17 ENCOUNTER — Other Ambulatory Visit: Payer: Self-pay

## 2019-04-17 ENCOUNTER — Encounter: Payer: Self-pay | Admitting: Family Medicine

## 2019-04-17 ENCOUNTER — Telehealth: Payer: BC Managed Care – PPO | Admitting: Nurse Practitioner

## 2019-04-17 ENCOUNTER — Ambulatory Visit (INDEPENDENT_AMBULATORY_CARE_PROVIDER_SITE_OTHER): Payer: BC Managed Care – PPO | Admitting: Family Medicine

## 2019-04-17 VITALS — BP 138/84 | HR 87 | Temp 98.3°F

## 2019-04-17 DIAGNOSIS — R1030 Lower abdominal pain, unspecified: Secondary | ICD-10-CM

## 2019-04-17 DIAGNOSIS — R300 Dysuria: Secondary | ICD-10-CM

## 2019-04-17 DIAGNOSIS — M545 Low back pain, unspecified: Secondary | ICD-10-CM

## 2019-04-17 LAB — UA/M W/RFLX CULTURE, ROUTINE
Bilirubin, UA: NEGATIVE
Glucose, UA: NEGATIVE
Ketones, UA: NEGATIVE
Leukocytes,UA: NEGATIVE
Nitrite, UA: NEGATIVE
Protein,UA: NEGATIVE
RBC, UA: NEGATIVE
Specific Gravity, UA: 1.015 (ref 1.005–1.030)
Urobilinogen, Ur: 0.2 mg/dL (ref 0.2–1.0)
pH, UA: 7 (ref 5.0–7.5)

## 2019-04-17 MED ORDER — CIPROFLOXACIN HCL 500 MG PO TABS: 500.0000 mg | ORAL_TABLET | Freq: Two times a day (BID) | ORAL | 0 refills | Status: DC

## 2019-04-17 NOTE — Progress Notes (Signed)
Based on what you shared with me it looks like you have urinary tract infection with back pain.and abdominal pain,that should be evaluated in a face to face office visit. Due to the associated back pain and abdominal pain you will need to have a urinalysis and urine culture in order to be treated properly.    NOTE: If you entered your credit card information for this eVisit, you will not be charged. You may see a "hold" on your card for the $35 but that hold will drop off and you will not have a charge processed.  If you are having a true medical emergency please call 911.     For an urgent face to face visit, [facility] has four urgent care centers for your convenience:   . [facility]    [phone]                  Get Driving Directions  [id number] [place], [place] [address] . 10 am to 8 pm Monday-Friday . 12 pm to 8 pm Saturday-Sunday   . [facility] [place]                  Get Driving Directions  [id number] [place], [place], [place] [address] . 8 am to 8 pm Monday-Friday . 9 am to 6 pm Saturday . 11 am to 6 pm Sunday   . [facility] [place]                  Get Driving Directions   [address] [place], [place] [address] . 8 am to 8 pm Monday-Friday . 8 am to 4 pm Saturday-Sunday    . [facility] [place]                    Get Driving Directions  [id number]  [address], [place],  [address]  . Monday-Friday, 12 PM to 6 PM    Your e-visit answers were reviewed by a board certified advanced clinical practitioner to complete your personal care plan.  Thank you for using e-Visits.

## 2019-04-17 NOTE — Progress Notes (Signed)
BP 138/84   Pulse 87   Temp 98.3 F (36.8 C) (Oral)   LMP 07/01/2013 Comment: tubal  SpO2 96%    Subjective:    Patient ID: Shirley Rogers, female    DOB: Oct 01, 1965, 53 y.o.   MRN: [id number]  HPI: Shirley Rogers is a 53 y.o. female  Chief Complaint  Patient presents with  . Urinary Tract Infection   URINARY SYMPTOMS Duration: 3 days Dysuria: yes Urinary frequency: yes Urgency: yes Small volume voids: yes Symptom severity: severe Urinary incontinence: no Foul odor: yes Hematuria: no Abdominal pain: yes Back pain: yes Suprapubic pain/pressure: yes Flank pain: yes Fever:  low grade Vomiting: no Relief with cranberry juice: no Relief with pyridium: no Status: worse Previous urinary tract infection: no Recurrent urinary tract infection: no History of sexually transmitted disease: no Vaginal discharge: no Treatments attempted: increasing fluids   Relevant past medical, surgical, family and social history reviewed and updated as indicated. Interim medical history since our last visit reviewed. Allergies and medications reviewed and updated.  Review of Systems  Constitutional: Negative.   Respiratory: Negative.   Cardiovascular: Negative.   Gastrointestinal: Positive for abdominal pain. Negative for abdominal distention, anal bleeding, blood in stool, constipation, diarrhea, nausea, rectal pain and vomiting.  Genitourinary: Positive for dysuria, flank pain, frequency, pelvic pain and urgency. Negative for decreased urine volume, difficulty urinating, dyspareunia, enuresis, genital sores, hematuria, menstrual problem, vaginal bleeding, vaginal discharge and vaginal pain.  Musculoskeletal: Positive for back pain and myalgias. Negative for arthralgias, gait problem, joint swelling, neck pain and neck stiffness.  Skin: Negative.   Neurological: Negative.   Psychiatric/Behavioral: Negative.     Per HPI unless specifically indicated above     Objective:    BP  138/84   Pulse 87   Temp 98.3 F (36.8 C) (Oral)   LMP 07/01/2013 Comment: tubal  SpO2 96%   Wt Readings from Last 3 Encounters:  03/22/19 252 lb (114.3 kg)  02/16/19 252 lb (114.3 kg)  12/07/18 246 lb (111.6 kg)    Physical Exam Vitals signs and nursing note reviewed.  Constitutional:      General: She is not in acute distress.    Appearance: Normal appearance. She is not ill-appearing, toxic-appearing or diaphoretic.  HENT:     Head: Normocephalic and atraumatic.     Right Ear: External ear normal.     Left Ear: External ear normal.     Nose: Nose normal.     Mouth/Throat:     Mouth: Mucous membranes are moist.     Pharynx: Oropharynx is clear.  Eyes:     General: No scleral icterus.       Right eye: No discharge.        Left eye: No discharge.     Extraocular Movements: Extraocular movements intact.     Conjunctiva/sclera: Conjunctivae normal.     Pupils: Pupils are equal, round, and reactive to light.  Neck:     Musculoskeletal: Normal range of motion and neck supple.  Cardiovascular:     Rate and Rhythm: Normal rate and regular rhythm.     Pulses: Normal pulses.     Heart sounds: Normal heart sounds. No murmur. No friction rub. No gallop.   Pulmonary:     Effort: Pulmonary effort is normal. No respiratory distress.     Breath sounds: Normal breath sounds. No stridor. No wheezing, rhonchi or rales.  Chest:     Chest wall: No tenderness.  Abdominal:  General: Abdomen is flat. Bowel sounds are normal. There is no distension.     Palpations: Abdomen is soft. There is no mass.     Tenderness: There is no abdominal tenderness. There is no right CVA tenderness, left CVA tenderness, guarding or rebound.     Hernia: No hernia is present.  Musculoskeletal: Normal range of motion.  Skin:    General: Skin is warm and dry.     Capillary Refill: Capillary refill takes less than 2 seconds.     Coloration: Skin is not jaundiced or pale.     Findings: No bruising, erythema,  lesion or rash.  Neurological:     General: No focal deficit present.     Mental Status: She is alert and oriented to person, place, and time. Mental status is at baseline.  Psychiatric:        Mood and Affect: Mood normal.        Behavior: Behavior normal.        Thought Content: Thought content normal.        Judgment: Judgment normal.     Results for orders placed or performed in visit on 04/17/19  UA/M w/rflx Culture, Routine   Specimen: Urine   URINE  Result Value Ref Range   Specific Gravity, UA 1.015 1.005 - 1.030   pH, UA 7.0 5.0 - 7.5   Color, UA Yellow Yellow   Appearance Ur Clear Clear   Leukocytes,UA Negative Negative   Protein,UA Negative Negative/Trace   Glucose, UA Negative Negative   Ketones, UA Negative Negative   RBC, UA Negative Negative   Bilirubin, UA Negative Negative   Urobilinogen, Ur 0.2 0.2 - 1.0 mg/dL   Nitrite, UA Negative Negative      Assessment & Plan:   Problem List Items Addressed This Visit    None    Visit Diagnoses    Dysuria    -  Primary   UA clear. Concern for UTI vs stone, declines CT at this time. Will treat with cipro and follow up in 2 days, if not better- will obtain CT.   Relevant Orders   UA/M w/rflx Culture, Routine (Completed)       Follow up plan: Return in about 2 days (around 04/19/2019).

## 2019-04-19 ENCOUNTER — Telehealth: Payer: Self-pay | Admitting: Family Medicine

## 2019-04-19 ENCOUNTER — Encounter: Payer: Self-pay | Admitting: Family Medicine

## 2019-04-19 ENCOUNTER — Other Ambulatory Visit: Payer: Self-pay

## 2019-04-19 ENCOUNTER — Ambulatory Visit (INDEPENDENT_AMBULATORY_CARE_PROVIDER_SITE_OTHER): Payer: BC Managed Care – PPO | Admitting: Family Medicine

## 2019-04-19 ENCOUNTER — Ambulatory Visit
Admission: RE | Admit: 2019-04-19 | Discharge: 2019-04-19 | Disposition: A | Discharge status: Home / Self Care | Payer: BC Managed Care – PPO | Source: Ambulatory Visit | Attending: Family Medicine | Admitting: Family Medicine

## 2019-04-19 DIAGNOSIS — R1030 Lower abdominal pain, unspecified: Secondary | ICD-10-CM

## 2019-04-19 LAB — POCT I-STAT CREATININE: Creatinine, Ser: 1 mg/dL (ref 0.44–1.00)

## 2019-04-19 MED ORDER — IOHEXOL 300 MG/ML  SOLN
100.0000 mL | Freq: Once | INTRAMUSCULAR | Status: AC | PRN
  Administered 2019-04-19: 100 mL via INTRAVENOUS

## 2019-04-19 NOTE — Telephone Encounter (Signed)
Called patient. She states that she did get Dr. Durenda Age message and her mychart message.

## 2019-04-19 NOTE — Progress Notes (Signed)
LMP 07/01/2013 Comment: tubal   Subjective:    Patient ID: Shirley Rogers, female    DOB: [birth year], 53 y.o.   MRN: [id number]  HPI: Shirley Rogers is a 53 y.o. female  Chief Complaint  Patient presents with  . Dysuria    2 day f/up   ABDOMINAL PAIN  Duration: about 6 days Onset: sudden Severity: severe Quality: sharp, aching Location:  suprapubic". "lower abdominal quadrants  Episode duration: constant Radiation: no Frequency: constant Alleviating factors: nothing Aggravating factors: nothing Status: stable Treatments attempted: none Fever: yes Nausea: yes Vomiting: no Weight loss: no Decreased appetite: yes Diarrhea: no Constipation: no Blood in stool: no Heartburn: no Jaundice: no Rash: no Dysuria/urinary frequency: yes Hematuria: no History of sexually transmitted disease: no Recurrent NSAID use: no  Relevant past medical, surgical, family and social history reviewed and updated as indicated. Interim medical history since our last visit reviewed. Allergies and medications reviewed and updated.  Review of Systems  Constitutional: Negative.   Respiratory: Negative.   Cardiovascular: Negative.   Gastrointestinal: Positive for abdominal pain. Negative for abdominal distention, anal bleeding, blood in stool, constipation, diarrhea, nausea, rectal pain and vomiting.  Genitourinary: Positive for dysuria, flank pain and urgency. Negative for decreased urine volume, difficulty urinating, dyspareunia, enuresis, frequency, genital sores, hematuria, menstrual problem, pelvic pain, vaginal bleeding, vaginal discharge and vaginal pain.  Psychiatric/Behavioral: Negative.     Per HPI unless specifically indicated above     Objective:    LMP 07/01/2013 Comment: tubal  Wt Readings from Last 3 Encounters:  03/22/19 252 lb (114.3 kg)  02/16/19 252 lb (114.3 kg)  12/07/18 246 lb (111.6 kg)    Physical Exam Vitals signs and nursing note reviewed.  Constitutional:       General: She is not in acute distress.    Appearance: Normal appearance. She is not ill-appearing, toxic-appearing or diaphoretic.  HENT:     Head: Normocephalic and atraumatic.     Right Ear: External ear normal.     Left Ear: External ear normal.     Nose: Nose normal.     Mouth/Throat:     Mouth: Mucous membranes are moist.     Pharynx: Oropharynx is clear.  Eyes:     General: No scleral icterus.       Right eye: No discharge.        Left eye: No discharge.     Conjunctiva/sclera: Conjunctivae normal.     Pupils: Pupils are equal, round, and reactive to light.  Neck:     Musculoskeletal: Normal range of motion.  Pulmonary:     Effort: Pulmonary effort is normal. No respiratory distress.     Comments: Speaking in full sentences Musculoskeletal: Normal range of motion.  Skin:    Coloration: Skin is not jaundiced or pale.     Findings: No bruising, erythema, lesion or rash.  Neurological:     Mental Status: She is alert and oriented to person, place, and time. Mental status is at baseline.  Psychiatric:        Mood and Affect: Mood normal.        Behavior: Behavior normal.        Thought Content: Thought content normal.        Judgment: Judgment normal.     Results for orders placed or performed in visit on 04/17/19  UA/M w/rflx Culture, Routine   Specimen: Urine   URINE  Result Value Ref Range   Specific Gravity, UA 1.015  1.005 - 1.030   pH, UA 7.0 5.0 - 7.5   Color, UA Yellow Yellow   Appearance Ur Clear Clear   Leukocytes,UA Negative Negative   Protein,UA Negative Negative/Trace   Glucose, UA Negative Negative   Ketones, UA Negative Negative   RBC, UA Negative Negative   Bilirubin, UA Negative Negative   Urobilinogen, Ur 0.2 0.2 - 1.0 mg/dL   Nitrite, UA Negative Negative      Assessment & Plan:   Problem List Items Addressed This Visit    None    Visit Diagnoses    Lower abdominal pain    -  Primary   No better with treatment with cipro. Concern  for diveriticulitis vs stone. Will obtain STAT CT abdomen- await results. Call with any concerns.    Relevant Orders   CT Abdomen Pelvis W Contrast       Follow up plan: Return Pending results.    . This visit was completed via FaceTime due to the restrictions of the COVID-19 pandemic. All issues as above were discussed and addressed. Physical exam was done as above through visual confirmation on FaceTime. If it was felt that the patient should be evaluated in the office, they were directed there. The patient verbally consented to this visit. . Location of the patient: home . Location of the provider: work . Those involved with this call:  . Provider: Park Liter, DO . CMA: Yvonna Alanis, [place] . Front Desk/Registration: Don Perking  . Time spent on call: 15 minutes with patient face to face via video conference. More than 50% of this time was spent in counseling and coordination of care. 23 minutes total spent in review of patient's record and preparation of their chart.

## 2019-04-19 NOTE — Telephone Encounter (Signed)
Called to give Shirley Rogers results- CT looks good. Very constipated. That may be contributing. She does have a R sided kidney stone- but it's in the kidney not in the ureter, so I doubt that's causing her the pain. Start taking some miralax 3x a day and we'll see if clearing her out gets her feeling better. If she's not better by Monday- let me know.  OK to give her this message if she calls back.

## 2019-05-04 ENCOUNTER — Other Ambulatory Visit: Payer: Self-pay | Admitting: Family Medicine

## 2019-05-04 NOTE — Telephone Encounter (Signed)
Requested medication (s) are due for refill today: yes  Requested medication (s) are on the active medication list: yes  Last refill: 10/25/2018  Future visit scheduled: no  Notes to clinic:refill cannot be delegated    Requested Prescriptions  Pending Prescriptions Disp Refills   traMADol (ULTRAM) 50 MG tablet [Pharmacy Med Name: TRAMADOL 50MG  TABLETS] 56 tablet     Sig: TAKE 1 TABLET(50 MG) BY MOUTH TWICE DAILY     Not Delegated - Analgesics:  Opioid Agonists Failed - 05/04/2019 10:12 AM      Failed - This refill cannot be delegated      Failed - Urine Drug Screen completed in last 360 days.      Passed - Valid encounter within last 6 months    Recent Outpatient Visits          2 weeks ago Lower abdominal pain   [place], Megan P, DO   2 weeks ago [place], Megan P, DO   1 month ago Upper respiratory tract infection, unspecified type   [place], Megan P, DO   3 months ago Routine general medical examination at a health care facility   [facility], [place] P, DO   6 months ago Suprapubic pain   [facility] Valerie Roys, DO      Future Appointments            In 3 days Leandrew Koyanagi, MD [facility]

## 2019-05-06 NOTE — Telephone Encounter (Signed)
Needs appointment

## 2019-05-06 NOTE — Telephone Encounter (Signed)
Routing to provider  

## 2019-05-07 ENCOUNTER — Encounter: Payer: Self-pay | Admitting: Family Medicine

## 2019-05-07 ENCOUNTER — Ambulatory Visit: Payer: BC Managed Care – PPO | Admitting: Orthopaedic Surgery

## 2019-05-07 ENCOUNTER — Other Ambulatory Visit: Payer: Self-pay

## 2019-05-07 ENCOUNTER — Ambulatory Visit: Payer: Self-pay

## 2019-05-07 DIAGNOSIS — M25562 Pain in left knee: Secondary | ICD-10-CM | POA: Diagnosis not present

## 2019-05-07 MED ORDER — BUPIVACAINE HCL 0.5 % IJ SOLN
2.0000 mL | INTRAMUSCULAR | Status: AC | PRN
  Administered 2019-05-07: 15:00:00 2 mL via INTRA_ARTICULAR

## 2019-05-07 MED ORDER — LIDOCAINE HCL 1 % IJ SOLN
2.0000 mL | INTRAMUSCULAR | Status: AC | PRN
  Administered 2019-05-07: 15:00:00 2 mL

## 2019-05-07 MED ORDER — METHYLPREDNISOLONE ACETATE 40 MG/ML IJ SUSP
40.0000 mg | INTRAMUSCULAR | Status: AC | PRN
  Administered 2019-05-07: 15:00:00 40 mg via INTRA_ARTICULAR

## 2019-05-07 NOTE — Progress Notes (Signed)
Office Visit Note   Patient: Shirley Rogers           Date of Birth: May 10, 1966           MRN: [id number] Visit Date: 05/07/2019              Requested by: Valerie Roys, DO [place],  [place] [address] PCP: Valerie Roys, DO   Assessment & Plan: Visit Diagnoses:  1. Acute pain of left knee     Plan: Impression is left knee osteoarthritis exacerbation from recent injury.  Aspiration injection performed today.  She will take it easy over the next couple weeks until she feels better.  Patient instructed to follow-up if she does not get any improvement from this.  Follow-up as needed otherwise.  Follow-Up Instructions: Return if symptoms worsen or fail to improve.   Orders:  Orders Placed This Encounter  Procedures  . XR KNEE 3 VIEW LEFT   No orders of the defined types were placed in this encounter.     Procedures: Large Joint Inj: L knee on 05/07/2019 3:15 PM Details: 22 G needle Medications: 2 mL bupivacaine 0.5 %; 2 mL lidocaine 1 %; 40 mg methylPREDNISolone acetate 40 MG/ML Aspirate: 17 mL yellow Outcome: tolerated well, no immediate complications Patient was prepped and draped in the usual sterile fashion.       Clinical Data: No additional findings.   Subjective: Chief Complaint  Patient presents with  . Left Knee - Pain    Shirley Rogers comes in today for evaluation of left knee pain.  She slipped on wet grass last Thursday and she jammed her leg and knee into a pothole.  Since then has been painful and swollen.  She endorses pain on the medial side as well as anterior lateral and posterior.  She feels like the knee is really tight.   Review of Systems  Constitutional: Negative.   HENT: Negative.   Eyes: Negative.   Respiratory: Negative.   Cardiovascular: Negative.   Endocrine: Negative.   Musculoskeletal: Negative.   Neurological: Negative.   Hematological: Negative.   Psychiatric/Behavioral: Negative.   All other systems reviewed and are  negative.    Objective: Vital Signs: LMP 07/01/2013 Comment: tubal  Physical Exam Vitals signs and nursing note reviewed.  Constitutional:      Appearance: She is well-developed.  Pulmonary:     Effort: Pulmonary effort is normal.  Skin:    General: Skin is warm.     Capillary Refill: Capillary refill takes less than 2 seconds.  Neurological:     Mental Status: She is alert and oriented to person, place, and time.  Psychiatric:        Behavior: Behavior normal.        Thought Content: Thought content normal.        Judgment: Judgment normal.     Ortho Exam Left knee exam shows a joint effusion.  She has medial joint line tenderness.  Collaterals and cruciates are stable.  Range of motion is slightly restricted secondary to effusion. Specialty Comments:  No specialty comments available.  Imaging: Xr Knee 3 View Left  Result Date: 05/07/2019 No acute or structural abnormalities.  There is mild joint space narrowing    PMFS History: Patient Active Problem List   Diagnosis Date Noted  . Osteoarthritis of spine with radiculopathy, cervical region 06/29/2018  . Chronic pain of both knees 11/17/2017  . Morbid obesity ([place]) 11/17/2017  . Body mass  index 40.0-44.9, adult ([place]) 11/17/2017  . Unilateral primary osteoarthritis, right knee 07/11/2017  . S/P left knee arthroscopy 06/06/2017  . Stress fracture of femur 05/16/2017  . Chondromalacia patellae, left knee 05/16/2017  . Primary localized osteoarthritis of both knees 04/26/2017  . ASCUS with positive high risk HPV cervical 01/18/2017  . Increased intracranial pressure 10/06/2016  . Vestibular hypofunction of left ear 09/21/2016  . Intracranial hypertension, benign 09/21/2016  . Chronic daily headache 09/21/2016  . Inflammatory arthritis 08/28/2016  . Nephrolithiasis 07/22/2016  . 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 Microhematuria 03/15/2016  . Pelvic floor  dysfunction 03/15/2016  . Urethral caruncle 03/15/2016  . History of pulmonary embolus (PE) 03/11/2016  . Facial droop 02/04/2016  . Right hemiparesis ([place]) 02/04/2016  . Tobacco use 12/03/2015  . ANA positive 10/19/2015  . Transient alteration of awareness 09/24/2015  . Snoring 06/10/2015  . Sleep apnea 06/10/2015  . 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 Globus sensation 03/30/2015  . Post concussion syndrome 03/13/2015  . Depression 03/13/2015  . Benign hypertensive renal disease   . Barrett esophagus   . Diverticulosis   . Hemiplegic migraine   . Lupus ([place])   . History of stroke    Past Medical History:  Diagnosis Date  . Anxiety    on xanax in the past  . ASCVD (arteriosclerotic cardiovascular disease)    carotids  . Barrett esophagus   . 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 Carotid atherosclerosis 2003   on ultrasound  . Collagen vascular disease ([place])   . Colon polyp   . Complicated migraine    with facial drooping  . Concussion    1974,1982,1987,2016,2016  . DDD (degenerative disc disease), lumbar    MRI 2008, L3-4, L5-S1, spondylotic changes, multilevel facet joint hypertrophic changes  . Depression    on paxil, lexapro, cymbalta in the past  . Diverticulosis   . Dizziness due to old head injury   . Family history of adverse reaction to anesthesia    father - slow to wake  . Fatty liver 2013   On CT abdomen/pelvis  . Fibrocystic breast disease   . Fibromyalgia   . Fracture    left knee medial femoral condyle stress fracture, chondromalacia  . GERD (gastroesophageal reflux disease)   . Hemiplegic migraine    daily  . Hemorrhoids   . History of kidney stones   . Hyperlipidemia   . Hypertension    no meds currently.  . IBS (irritable bowel syndrome)   . Insomnia chronic  . Kidney stone 2013   on CT abdomen/pelvis  . Lupus ([place]) 1990   multiple plaques on MRI consistent with lupus vasculitis in 2003,  question Behcet's   . Migraine   . Narrowing of intervertebral disc space 2011   C5-6  . Normal cardiac stress test 2009   EF 66%  . Pneumonia   . Pulmonary emboli ([place])   . Recurrent sinusitis   . Recurrent UTI    question about IC in the past  . Sleep apnea    CPAP machine broken  . Stroke ([place])    2000 - no deficits  . Umbilical hernia 0000000   on CT abdomen/pelvis  . Varicose veins    right lower leg  . Vascular spasm ([place])   . Vertigo    from concussive disorder.  seeing neuro 03/23/15    Family  History  Problem Relation Age of Onset  . Other Mother        IBS  . Hypertension Mother   . Heart disease Father   . Diverticulosis Father   . Migraines Father   . Other Son        Ulcerative Colitis, Polonidal cyst  . Parkinson's disease Maternal Grandmother   . Cancer Maternal Grandmother   . Mental illness Paternal Grandmother   . Stroke Paternal Grandmother   . Cancer Paternal Grandmother        brain cancer  . Bipolar disorder Paternal Grandmother   . Stroke Maternal Grandfather   . Cancer Maternal Grandfather        Bladder, Prostate/bladder/prostate  . Heart disease Paternal Grandfather   . Heart attack Paternal Grandfather   . Macular degeneration Other   . Diabetes Brother   . Breast cancer Maternal Aunt     Past Surgical History:  Procedure Laterality Date  . Biopsy Punch Thyroid    . BREAST EXCISIONAL BIOPSY Right 2000   right mass excision - atypical hyperplasia  . BREAST SURGERY Right    atypical hyperplasia  . CHOLECYSTECTOMY N/A 07/24/2015   Procedure: LAPAROSCOPIC CHOLECYSTECTOMY WITH INTRAOPERATIVE CHOLANGIOGRAM;  Surgeon: Leonie Green, MD;  Location: [facility] ORS;  Service: General;  Laterality: N/A;  . COLONOSCOPY W/ BIOPSIES     Removed 5 polyps  . COLONOSCOPY WITH PROPOFOL N/A 03/03/2017   Procedure: COLONOSCOPY WITH PROPOFOL;  Surgeon: [name], MD;  Location: [facility] ENDOSCOPY;  Service: Gastroenterology;  Laterality: N/A;  .  COLONOSCOPY WITH PROPOFOL N/A 01/08/2018   Procedure: COLONOSCOPY WITH PROPOFOL;  Surgeon: [name], MD;  Location: [facility] ENDOSCOPY;  Service: Gastroenterology;  Laterality: N/A;  . ESOPHAGOGASTRODUODENOSCOPY (EGD) WITH PROPOFOL N/A 01/08/2018   Procedure: ESOPHAGOGASTRODUODENOSCOPY (EGD) WITH PROPOFOL;  Surgeon: [name], MD;  Location: [facility] ENDOSCOPY;  Service: Gastroenterology;  Laterality: N/A;  . KNEE ARTHROSCOPY WITH SUBCHONDROPLASTY Left 05/26/2017   Procedure: LEFT KNEE ARTHROSCOPY WITH SUBCHONDROPLASTY, CHONDROPLASTY, SYNOVECTOMY, POSSIBLE MICROFX;  Surgeon: Leandrew Koyanagi, MD;  Location: [facility];  Service: Orthopedics;  Laterality: Left;  . 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 Smoking status: Current Some Day Smoker    Types: Cigarettes    Last attempt to quit: 06/12/2015    Years since quitting: 3.9  . Smokeless tobacco: Never Used  . Tobacco comment: 1-2 cigarettes some days  Substance and Sexual Activity  . Alcohol use: No    Alcohol/week: 0.0 standard drinks  . Drug use: No  . Sexual activity: Never    Comment: tubal ligation and menopause

## 2019-05-07 NOTE — Telephone Encounter (Signed)
Called pt, no answer, vm not set up, sending letter

## 2019-05-15 ENCOUNTER — Encounter: Payer: Self-pay | Admitting: Family Medicine

## 2019-06-06 ENCOUNTER — Other Ambulatory Visit: Payer: Self-pay | Admitting: Family Medicine

## 2019-06-06 ENCOUNTER — Telehealth: Payer: Self-pay

## 2019-06-06 NOTE — Telephone Encounter (Signed)
Patient called and left a VM concerning pain in the back of her left knee.  Tried calling patient back, but no answer and no VM setup to leave a message.  Will try again.

## 2019-06-11 ENCOUNTER — Ambulatory Visit: Payer: BC Managed Care – PPO | Admitting: Orthopaedic Surgery

## 2019-06-11 ENCOUNTER — Other Ambulatory Visit: Payer: Self-pay

## 2019-06-11 ENCOUNTER — Encounter: Payer: Self-pay | Admitting: Orthopaedic Surgery

## 2019-06-11 DIAGNOSIS — M1712 Unilateral primary osteoarthritis, left knee: Secondary | ICD-10-CM | POA: Diagnosis not present

## 2019-06-11 MED ORDER — TRAMADOL HCL 50 MG PO TABS: 50.0000 mg | ORAL_TABLET | Freq: Three times a day (TID) | ORAL | 1 refills | Status: DC | PRN

## 2019-06-11 MED ORDER — ONDANSETRON HCL 4 MG PO TABS: 4.0000 mg | ORAL_TABLET | Freq: Three times a day (TID) | ORAL | 0 refills | Status: DC | PRN

## 2019-06-11 NOTE — Progress Notes (Signed)
Office Visit Note   Patient: Shirley Rogers           Date of Birth: 12-21-1965           MRN: [id number] Visit Date: 06/11/2019              Requested by: Valerie Roys, DO [place],  [place] [address] PCP: Valerie Roys, DO   Assessment & Plan: Visit Diagnoses:  1. Unilateral primary osteoarthritis, left knee     Plan: Impression is left knee arthritis flareup versus questionable medial meniscus tear.  At this point, we will proceed with MRI to further evaluate for structural abnormalities.  She will follow-up with [place] once that has been completed.  Call with concerns or questions in the meantime.  Follow-Up Instructions: Return for after MRI.   Orders:  No orders of the defined types were placed in this encounter.  Meds ordered this encounter  Medications  . traMADol (ULTRAM) 50 MG tablet    Sig: Take 1 tablet (50 mg total) by mouth 3 (three) times daily as needed.    Dispense:  30 tablet    Refill:  1  . ondansetron (ZOFRAN) 4 MG tablet    Sig: Take 1 tablet (4 mg total) by mouth every 8 (eight) hours as needed for nausea or vomiting.    Dispense:  40 tablet    Refill:  0      Procedures: No procedures performed   Clinical Data: No additional findings.   Subjective: Chief Complaint  Patient presents with  . Left Knee - Pain    HPI patient is a pleasant 54 year old female who comes in today with a new injury to her left knee.  She notes that approximately 1 week ago she was turning and felt a pop to the medial and lateral aspect of the left knee.  She has had pain to both places since.  She describes this as sharp and shooting in nature.  No mechanical symptoms.  Her pain is exacerbated with standing, pivoting as well as flexion of the knee.  She has tried ice and heat without relief of symptoms.  She does note a tingling sensation to her third through fifth toes following this twisting injury.  Of note, she is approximately 2 years status post left  knee subchondroplasty medial femoral condyle with grade 3 and 4 changes.  She was seen in our office this past December after stepping in a pothole injuring her left knee.  Her left knee was aspirated and injected with cortisone.  This helped until this most recent injury.  Review of Systems as detailed in HPI.  All others reviewed and are negative.   Objective: Vital Signs: LMP 07/01/2013 Comment: tubal  Physical Exam well-developed well-nourished female no acute distress.  Alert oriented x3.  Ortho Exam examination of her left knee shows a small effusion.  Range of motion 0 to 100 degrees.  Medial joint line tenderness.  Mild patellofemoral crepitus.  Ligaments are stable.  She is neurovascular intact distally.  Specialty Comments:  No specialty comments available.  Imaging: No new imaging   PMFS History: Patient Active Problem List   Diagnosis Date Noted  . Osteoarthritis of spine with radiculopathy, cervical region 06/29/2018  . Chronic pain of both knees 11/17/2017  . Morbid obesity ([place]) 11/17/2017  . Body mass index 40.0-44.9, adult ([place]) 11/17/2017  . Unilateral primary osteoarthritis, right knee 07/11/2017  . S/P left knee arthroscopy 06/06/2017  .  Stress fracture of femur 05/16/2017  . Chondromalacia patellae, left knee 05/16/2017  . Primary localized osteoarthritis of both knees 04/26/2017  . ASCUS with positive high risk HPV cervical 01/18/2017  . Increased intracranial pressure 10/06/2016  . Vestibular hypofunction of left ear 09/21/2016  . Intracranial hypertension, benign 09/21/2016  . Chronic daily headache 09/21/2016  . Inflammatory arthritis 08/28/2016  . Nephrolithiasis 07/22/2016  . 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 Microhematuria 03/15/2016  . Pelvic floor dysfunction 03/15/2016  . Urethral caruncle 03/15/2016  . History of pulmonary embolus (PE) 03/11/2016  . Facial droop 02/04/2016  . Right  hemiparesis ([place]) 02/04/2016  . Tobacco use 12/03/2015  . ANA positive 10/19/2015  . Transient alteration of awareness 09/24/2015  . Snoring 06/10/2015  . Sleep apnea 06/10/2015  . 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 Globus sensation 03/30/2015  . Post concussion syndrome 03/13/2015  . Depression 03/13/2015  . Benign hypertensive renal disease   . Barrett esophagus   . Diverticulosis   . Hemiplegic migraine   . Lupus ([place])   . History of stroke    Past Medical History:  Diagnosis Date  . Anxiety    on xanax in the past  . ASCVD (arteriosclerotic cardiovascular disease)    carotids  . Barrett esophagus   . 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 Carotid atherosclerosis 2003   on ultrasound  . Collagen vascular disease ([place])   . Colon polyp   . Complicated migraine    with facial drooping  . Concussion    1974,1982,1987,2016,2016  . DDD (degenerative disc disease), lumbar    MRI 2008, L3-4, L5-S1, spondylotic changes, multilevel facet joint hypertrophic changes  . Depression    on paxil, lexapro, cymbalta in the past  . Diverticulosis   . Dizziness due to old head injury   . Family history of adverse reaction to anesthesia    father - slow to wake  . Fatty liver 2013   On CT abdomen/pelvis  . Fibrocystic breast disease   . Fibromyalgia   . Fracture    left knee medial femoral condyle stress fracture, chondromalacia  . GERD (gastroesophageal reflux disease)   . Hemiplegic migraine    daily  . Hemorrhoids   . History of kidney stones   . Hyperlipidemia   . Hypertension    no meds currently.  . IBS (irritable bowel syndrome)   . Insomnia chronic  . Kidney stone 2013   on CT abdomen/pelvis  . Lupus ([place]) 1990   multiple plaques on MRI consistent with lupus vasculitis in 2003, question Behcet's   . Migraine   . Narrowing of intervertebral disc space 2011   C5-6  . Normal cardiac stress test 2009   EF 66%  .  Pneumonia   . Pulmonary emboli ([place])   . Recurrent sinusitis   . Recurrent UTI    question about IC in the past  . Sleep apnea    CPAP machine broken  . Stroke ([place])    2000 - no deficits  . Umbilical hernia 0000000   on CT abdomen/pelvis  . Varicose veins    right lower leg  . Vascular spasm ([place])   . Vertigo    from concussive disorder.  seeing neuro 03/23/15    Family History  Problem Relation Age of Onset  . Other Mother        IBS  . Hypertension  Mother   . Heart disease Father   . Diverticulosis Father   . Migraines Father   . Other Son        Ulcerative Colitis, Polonidal cyst  . Parkinson's disease Maternal Grandmother   . Cancer Maternal Grandmother   . Mental illness Paternal Grandmother   . Stroke Paternal Grandmother   . Cancer Paternal Grandmother        brain cancer  . Bipolar disorder Paternal Grandmother   . Stroke Maternal Grandfather   . Cancer Maternal Grandfather        Bladder, Prostate/bladder/prostate  . Heart disease Paternal Grandfather   . Heart attack Paternal Grandfather   . Macular degeneration Other   . Diabetes Brother   . Breast cancer Maternal Aunt     Past Surgical History:  Procedure Laterality Date  . Biopsy Punch Thyroid    . BREAST EXCISIONAL BIOPSY Right 2000   right mass excision - atypical hyperplasia  . BREAST SURGERY Right    atypical hyperplasia  . CHOLECYSTECTOMY N/A 07/24/2015   Procedure: LAPAROSCOPIC CHOLECYSTECTOMY WITH INTRAOPERATIVE CHOLANGIOGRAM;  Surgeon: Leonie Green, MD;  Location: [facility] ORS;  Service: General;  Laterality: N/A;  . COLONOSCOPY W/ BIOPSIES     Removed 5 polyps  . COLONOSCOPY WITH PROPOFOL N/A 03/03/2017   Procedure: COLONOSCOPY WITH PROPOFOL;  Surgeon: [name], MD;  Location: [facility] ENDOSCOPY;  Service: Gastroenterology;  Laterality: N/A;  . COLONOSCOPY WITH PROPOFOL N/A 01/08/2018   Procedure: COLONOSCOPY WITH PROPOFOL;  Surgeon: [name], MD;  Location: [facility] ENDOSCOPY;  Service:  Gastroenterology;  Laterality: N/A;  . ESOPHAGOGASTRODUODENOSCOPY (EGD) WITH PROPOFOL N/A 01/08/2018   Procedure: ESOPHAGOGASTRODUODENOSCOPY (EGD) WITH PROPOFOL;  Surgeon: [name], MD;  Location: [facility] ENDOSCOPY;  Service: Gastroenterology;  Laterality: N/A;  . KNEE ARTHROSCOPY WITH SUBCHONDROPLASTY Left 05/26/2017   Procedure: LEFT KNEE ARTHROSCOPY WITH SUBCHONDROPLASTY, CHONDROPLASTY, SYNOVECTOMY, POSSIBLE MICROFX;  Surgeon: Leandrew Koyanagi, MD;  Location: [facility];  Service: Orthopedics;  Laterality: Left;  . 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 Smoking status: Current Some Day Smoker    Types: Cigarettes    Last attempt to quit: 06/12/2015    Years since quitting: 4.0  . Smokeless tobacco: Never Used  . Tobacco comment: 1-2 cigarettes some days  Substance and Sexual Activity  . Alcohol use: No    Alcohol/week: 0.0 standard drinks  . Drug use: No  . Sexual activity: Never    Comment: tubal ligation and menopause

## 2019-06-12 NOTE — Addendum Note (Signed)
Addended by: Precious Bard on: 06/12/2019 10:35 AM   Modules accepted: Orders

## 2019-06-19 ENCOUNTER — Other Ambulatory Visit: Payer: Self-pay

## 2019-06-19 ENCOUNTER — Ambulatory Visit
Admission: RE | Admit: 2019-06-19 | Discharge: 2019-06-19 | Disposition: A | Discharge status: Home / Self Care | Payer: BC Managed Care – PPO | Source: Ambulatory Visit | Attending: Orthopaedic Surgery | Admitting: Orthopaedic Surgery

## 2019-06-19 DIAGNOSIS — M1712 Unilateral primary osteoarthritis, left knee: Secondary | ICD-10-CM

## 2019-06-25 ENCOUNTER — Ambulatory Visit: Payer: BC Managed Care – PPO | Admitting: Orthopaedic Surgery

## 2019-06-27 ENCOUNTER — Other Ambulatory Visit: Payer: Self-pay | Admitting: Family Medicine

## 2019-07-09 ENCOUNTER — Ambulatory Visit: Payer: BC Managed Care – PPO | Admitting: Orthopaedic Surgery

## 2019-07-09 ENCOUNTER — Encounter: Payer: Self-pay | Admitting: Orthopaedic Surgery

## 2019-07-09 ENCOUNTER — Other Ambulatory Visit: Payer: Self-pay

## 2019-07-09 VITALS — Ht 64.0 in | Wt 253.0 lb

## 2019-07-09 DIAGNOSIS — M25562 Pain in left knee: Secondary | ICD-10-CM | POA: Diagnosis not present

## 2019-07-09 NOTE — Progress Notes (Signed)
Office Visit Note   Patient: Shirley Rogers           Date of Birth: 1966-01-17           MRN: [id number] Visit Date: 07/09/2019              Requested by: Valerie Roys, DO [place],  [place] [id number] PCP: Valerie Roys, DO   Assessment & Plan: Visit Diagnoses:  1. Acute pain of left knee     Plan: Left knee MRI shows a medial meniscal root tear with extrusion.  She has tricompartmental DJD.  These findings were discussed with Claiborne Billings in detail and various treatment options were discussed at length today.  Based on our discussion she will work on weight loss to get her BMI down to 40 so that she could be a candidate for knee replacement.  She has gotten some mild relief from the previous cortisone injection.  We briefly discussed possibility of arthroscopic medial meniscus debridement as an intermediate step and possibly achieving pain relief.  Follow-Up Instructions: Return if symptoms worsen or fail to improve.   Orders:  No orders of the defined types were placed in this encounter.  No orders of the defined types were placed in this encounter.     Procedures: No procedures performed   Clinical Data: No additional findings.   Subjective: Chief Complaint  Patient presents with  . Left Knee - Pain    Deni returns today for MRI review of the left knee.  No new numbness or tingling.   Review of Systems  Constitutional: Negative.   HENT: Negative.   Eyes: Negative.   Respiratory: Negative.   Cardiovascular: Negative.   Endocrine: Negative.   Musculoskeletal: Negative.   Neurological: Negative.   Hematological: Negative.   Psychiatric/Behavioral: Negative.   All other systems reviewed and are negative.    Objective: Vital Signs: Ht 5\' 4"  (1.626 m)   Wt 253 lb (114.8 kg)   LMP 07/01/2013 Comment: tubal  BMI 43.43 kg/m   Physical Exam Vitals and nursing note reviewed.  Constitutional:      Appearance: She is well-developed.  Pulmonary:    Effort: Pulmonary effort is normal.  Skin:    General: Skin is warm.     Capillary Refill: Capillary refill takes less than 2 seconds.  Neurological:     Mental Status: She is alert and oriented to person, place, and time.  Psychiatric:        Behavior: Behavior normal.        Thought Content: Thought content normal.        Judgment: Judgment normal.     Ortho Exam Left knee exam is unchanged. Specialty Comments:  No specialty comments available.  Imaging: No results found.   PMFS History: Patient Active Problem List   Diagnosis Date Noted  . Osteoarthritis of spine with radiculopathy, cervical region 06/29/2018  . Chronic pain of both knees 11/17/2017  . Morbid obesity ([place]) 11/17/2017  . Body mass index 40.0-44.9, adult ([place]) 11/17/2017  . Unilateral primary osteoarthritis, right knee 07/11/2017  . S/P left knee arthroscopy 06/06/2017  . Stress fracture of femur 05/16/2017  . Chondromalacia patellae, left knee 05/16/2017  . Primary localized osteoarthritis of both knees 04/26/2017  . ASCUS with positive high risk HPV cervical 01/18/2017  . Increased intracranial pressure 10/06/2016  . Vestibular hypofunction of left ear 09/21/2016  . Intracranial hypertension, benign 09/21/2016  . Chronic daily headache 09/21/2016  .  Inflammatory arthritis 08/28/2016  . Nephrolithiasis 07/22/2016  . 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 Microhematuria 03/15/2016  . Pelvic floor dysfunction 03/15/2016  . Urethral caruncle 03/15/2016  . History of pulmonary embolus (PE) 03/11/2016  . Facial droop 02/04/2016  . Right hemiparesis ([place]) 02/04/2016  . Tobacco use 12/03/2015  . ANA positive 10/19/2015  . Transient alteration of awareness 09/24/2015  . Snoring 06/10/2015  . Sleep apnea 06/10/2015  . 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 Globus sensation 03/30/2015  . Post  concussion syndrome 03/13/2015  . Depression 03/13/2015  . Benign hypertensive renal disease   . Barrett esophagus   . Diverticulosis   . Hemiplegic migraine   . Lupus ([place])   . History of stroke    Past Medical History:  Diagnosis Date  . Anxiety    on xanax in the past  . ASCVD (arteriosclerotic cardiovascular disease)    carotids  . Barrett esophagus   . 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 Carotid atherosclerosis 2003   on ultrasound  . Collagen vascular disease ([place])   . Colon polyp   . Complicated migraine    with facial drooping  . Concussion    1974,1982,1987,2016,2016  . DDD (degenerative disc disease), lumbar    MRI 2008, L3-4, L5-S1, spondylotic changes, multilevel facet joint hypertrophic changes  . Depression    on paxil, lexapro, cymbalta in the past  . Diverticulosis   . Dizziness due to old head injury   . Family history of adverse reaction to anesthesia    father - slow to wake  . Fatty liver 2013   On CT abdomen/pelvis  . Fibrocystic breast disease   . Fibromyalgia   . Fracture    left knee medial femoral condyle stress fracture, chondromalacia  . GERD (gastroesophageal reflux disease)   . Hemiplegic migraine    daily  . Hemorrhoids   . History of kidney stones   . Hyperlipidemia   . Hypertension    no meds currently.  . IBS (irritable bowel syndrome)   . Insomnia chronic  . Kidney stone 2013   on CT abdomen/pelvis  . Lupus ([place]) 1990   multiple plaques on MRI consistent with lupus vasculitis in 2003, question Behcet's   . Migraine   . Narrowing of intervertebral disc space 2011   C5-6  . Normal cardiac stress test 2009   EF 66%  . Pneumonia   . Pulmonary emboli ([place])   . Recurrent sinusitis   . Recurrent UTI    question about IC in the past  . Sleep apnea    CPAP machine broken  . Stroke ([place])    2000 - no deficits  . Umbilical hernia 0000000   on CT abdomen/pelvis  . Varicose veins    right lower leg  . Vascular spasm ([place])   . Vertigo      from concussive disorder.  seeing neuro 03/23/15    Family History  Problem Relation Age of Onset  . Other Mother        IBS  . Hypertension Mother   . Heart disease Father   . Diverticulosis Father   . Migraines Father   . Other Son        Ulcerative Colitis, Polonidal cyst  . Parkinson's disease Maternal Grandmother   . Cancer Maternal Grandmother   . Mental illness Paternal Grandmother   . Stroke Paternal Grandmother   .  Cancer Paternal Grandmother        brain cancer  . Bipolar disorder Paternal Grandmother   . Stroke Maternal Grandfather   . Cancer Maternal Grandfather        Bladder, Prostate/bladder/prostate  . Heart disease Paternal Grandfather   . Heart attack Paternal Grandfather   . Macular degeneration Other   . Diabetes Brother   . Breast cancer Maternal Aunt     Past Surgical History:  Procedure Laterality Date  . Biopsy Punch Thyroid    . BREAST EXCISIONAL BIOPSY Right 2000   right mass excision - atypical hyperplasia  . BREAST SURGERY Right    atypical hyperplasia  . CHOLECYSTECTOMY N/A 07/24/2015   Procedure: LAPAROSCOPIC CHOLECYSTECTOMY WITH INTRAOPERATIVE CHOLANGIOGRAM;  Surgeon: Leonie Green, MD;  Location: [facility] ORS;  Service: General;  Laterality: N/A;  . COLONOSCOPY W/ BIOPSIES     Removed 5 polyps  . COLONOSCOPY WITH PROPOFOL N/A 03/03/2017   Procedure: COLONOSCOPY WITH PROPOFOL;  Surgeon: [name], MD;  Location: [facility] ENDOSCOPY;  Service: Gastroenterology;  Laterality: N/A;  . COLONOSCOPY WITH PROPOFOL N/A 01/08/2018   Procedure: COLONOSCOPY WITH PROPOFOL;  Surgeon: [name], MD;  Location: [facility] ENDOSCOPY;  Service: Gastroenterology;  Laterality: N/A;  . ESOPHAGOGASTRODUODENOSCOPY (EGD) WITH PROPOFOL N/A 01/08/2018   Procedure: ESOPHAGOGASTRODUODENOSCOPY (EGD) WITH PROPOFOL;  Surgeon: [name], MD;  Location[facility] END[facility]COPY;  Service: Gastroenterology;  Laterality: N/A;  . KNEE ARTHROSCOPY WITH SUBCHONDROPLASTY Left 05/26/2017    Procedure: LEFT KNEE ARTHROSCOPY WITH SUBCHONDROPLASTY, CHONDROPLASTY, SYNOVECTOMY, POSSIBLE MICROFX;  Surgeon: Leandrew Koyanagi, MD;  Location: [place];  Service: Orthopedics;  Laterality: Left;  . 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 Smoking status: Current Some Day Smoker    Types: Cigarettes    Last attempt to quit: 06/12/2015    Years since quitting: 4.0  . Smokeless tobacco: Never Used  . Tobacco comment: 1-2 cigarettes some days  Substance and Sexual Activity  . Alcohol use: No    Alcohol/week: 0.0 standard drinks  . Drug use: No  . Sexual activity: Never    Comment: tubal ligation and menopause

## 2019-07-12 ENCOUNTER — Other Ambulatory Visit: Payer: Self-pay | Admitting: Family Medicine

## 2019-07-12 NOTE — Telephone Encounter (Signed)
Requested medication (s) are due for refill today: yes  Requested medication (s) are on the active medication list: yes  Last refill:04/16/2019  Future visit scheduled: no  Notes to clinic: review weight management medication for refill Other medication has been increased but was unable to just refuse one medication    Requested Prescriptions  Pending Prescriptions Disp Refills   buPROPion (WELLBUTRIN SR) 150 MG 12 hr tablet [Pharmacy Med Name: BUPROPION SR 150MG  TABLETS (12 H)] 180 tablet 1    Sig: TAKE 1 TABLET(150 MG) BY MOUTH TWICE DAILY      Psychiatry: Antidepressants - bupropion Passed - 07/12/2019 11:51 AM      Passed - Completed PHQ-2 or PHQ-9 in the last 360 days.      Passed - Last BP in normal range    BP Readings from Last 1 Encounters:  04/17/19 138/84          Passed - Valid encounter within last 6 months    Recent Outpatient Visits           2 months ago Lower abdominal pain   [place], [place], DO   2 months ago [place], Megan P, DO   3 months ago Upper respiratory tract infection, unspecified type   [place], Megan P, DO   5 months ago Routine general medical examination at a health care facility   [facility] [place] P, DO   8 months ago Suprapubic pain   [place], Megan P, DO                SAXENDA 18 MG/3ML SOPN [Pharmacy Med Name: SAXENDA 6MG /ML PEN INJ 3ML] 45 mL 0    Sig: ADMINISTER 3 MG UNDER THE SKIN DAILY      Endocrinology:  Diabetes - GLP-1 Receptor Agonists Failed - 07/12/2019 11:51 AM      Failed - HBA1C is between 0 and 7.9 and within 180 days    HB A1C (BAYER DCA - WAIVED)  Date Value Ref Range Status  11/16/2017 5.2 <7.0 % Final    Comment:                                          Diabetic Adult            <7.0                                       Healthy Adult        4.3 - 5.7                                     (DCCT/NGSP) American Diabetes Association's Summary of Glycemic Recommendations for Adults with Diabetes: Hemoglobin A1c <7.0%. More stringent glycemic goals (A1c <6.0%) may further reduce complications at the cost of increased risk of hypoglycemia.           Passed - Valid encounter within last 6 months    Recent Outpatient Visits           2 months ago Lower abdominal pain   [place], Megan P, DO   2 months ago Dysuria  [place], Megan P, DO   3 months ago Upper respiratory tract infection, unspecified type   [place], DO   5 months ago Routine general medical examination at a health care facility   [facility], [place] P, DO   8 months ago Suprapubic pain   [place], [place], [place]

## 2019-07-18 NOTE — Telephone Encounter (Signed)
Due for follow up

## 2019-07-19 NOTE — Telephone Encounter (Signed)
Scheduled pt for 08/02/19

## 2019-07-19 NOTE — Telephone Encounter (Signed)
Called pt to let her know that rx has been sent to pharmacy, no answer, vm not set up

## 2019-07-23 ENCOUNTER — Other Ambulatory Visit: Payer: Self-pay | Admitting: Family Medicine

## 2019-07-23 NOTE — Telephone Encounter (Signed)
Requested Prescriptions  Pending Prescriptions Disp Refills  . furosemide (LASIX) 20 MG tablet [Pharmacy Med Name: FUROSEMIDE 20MG  TABLETS] 90 tablet 0    Sig: TAKE 1 TABLET(20 MG) BY MOUTH DAILY     Cardiovascular:  Diuretics - Loop Passed - 07/23/2019  4:09 PM      Passed - K in normal range and within 360 days    Potassium  Date Value Ref Range Status  01/25/2019 4.0 3.5 - 5.2 mmol/L Final  06/07/2014 4.0 3.5 - 5.1 mmol/L Final         Passed - Ca in normal range and within 360 days    Calcium  Date Value Ref Range Status  01/25/2019 9.3 8.7 - 10.2 mg/dL Final   Calcium, Total  Date Value Ref Range Status  06/07/2014 8.8 8.5 - 10.1 mg/dL Final         Passed - Na in normal range and within 360 days    Sodium  Date Value Ref Range Status  01/25/2019 139 134 - 144 mmol/L Final  06/07/2014 138 136 - 145 mmol/L Final         Passed - Cr in normal range and within 360 days    Creatinine  Date Value Ref Range Status  06/07/2014 0.87 0.60 - 1.30 mg/dL Final   Creatinine, Ser  Date Value Ref Range Status  04/19/2019 1.00 0.44 - 1.00 mg/dL Final         Passed - Last BP in normal range    BP Readings from Last 1 Encounters:  04/17/19 138/84         Passed - Valid encounter within last 6 months    Recent Outpatient Visits          3 months ago Lower abdominal pain   [place], Megan P, DO   3 months ago [place], Megan P, DO   4 months ago Upper respiratory tract infection, unspecified type   [place], Megan P, DO   5 months ago Routine general medical examination at a health care facility   [facility], [place] P, DO   9 months ago Suprapubic pain   [place], Barb Merino, DO      Future Appointments            In 1 week Valerie Roys, DO [facility]

## 2019-07-27 ENCOUNTER — Ambulatory Visit: Payer: BC Managed Care – PPO

## 2019-07-28 ENCOUNTER — Ambulatory Visit: Payer: BC Managed Care – PPO | Attending: Internal Medicine

## 2019-07-28 DIAGNOSIS — Z23 Encounter for immunization: Secondary | ICD-10-CM | POA: Insufficient documentation

## 2019-07-28 NOTE — Progress Notes (Signed)
   Covid-19 Vaccination Clinic  Name:  Shirley Rogers    MRN: [id number] DOB: 1965-12-07  07/28/2019  [name] was observed post Covid-19 immunization for 15 minutes without incidence. She was provided with Vaccine Information Sheet and instruction to access the V-Safe system.   [name] was instructed to call 911 with any severe reactions post vaccine: Marland Kitchen Difficulty breathing  . Swelling of your face and throat  . A fast heartbeat  . A bad rash all over your body  . Dizziness and weakness    Immunizations Administered    Name Date Dose VIS Date Route   Pfizer COVID-19 Vaccine 07/28/2019 12:37 PM 0.3 mL 05/10/2019 Intramuscular   Manufacturer: [place]   Lot: [id number]   [place]: [id number]

## 2019-08-02 ENCOUNTER — Encounter: Payer: Self-pay | Admitting: Family Medicine

## 2019-08-02 ENCOUNTER — Ambulatory Visit (INDEPENDENT_AMBULATORY_CARE_PROVIDER_SITE_OTHER): Payer: BC Managed Care – PPO | Admitting: Family Medicine

## 2019-08-02 ENCOUNTER — Other Ambulatory Visit: Payer: Self-pay

## 2019-08-02 VITALS — BP 138/73 | HR 84 | Temp 98.0°F

## 2019-08-02 DIAGNOSIS — Z8673 Personal history of transient ischemic attack (TIA), and cerebral infarction without residual deficits: Secondary | ICD-10-CM | POA: Diagnosis not present

## 2019-08-02 DIAGNOSIS — M329 Systemic lupus erythematosus, unspecified: Secondary | ICD-10-CM | POA: Diagnosis not present

## 2019-08-02 DIAGNOSIS — Z13220 Encounter for screening for lipoid disorders: Secondary | ICD-10-CM

## 2019-08-02 DIAGNOSIS — G8929 Other chronic pain: Secondary | ICD-10-CM | POA: Diagnosis not present

## 2019-08-02 DIAGNOSIS — M25562 Pain in left knee: Secondary | ICD-10-CM

## 2019-08-02 DIAGNOSIS — R8281 Pyuria: Secondary | ICD-10-CM

## 2019-08-02 DIAGNOSIS — F331 Major depressive disorder, recurrent, moderate: Secondary | ICD-10-CM

## 2019-08-02 DIAGNOSIS — I129 Hypertensive chronic kidney disease with stage 1 through stage 4 chronic kidney disease, or unspecified chronic kidney disease: Secondary | ICD-10-CM

## 2019-08-02 DIAGNOSIS — M352 Behcet's disease: Secondary | ICD-10-CM

## 2019-08-02 MED ORDER — NAPROXEN 500 MG PO TABS: 500.0000 mg | ORAL_TABLET | Freq: Two times a day (BID) | ORAL | 1 refills | Status: AC

## 2019-08-02 MED ORDER — VERAPAMIL HCL ER 240 MG PO TBCR: EXTENDED_RELEASE_TABLET | ORAL | 1 refills | Status: DC

## 2019-08-02 MED ORDER — TRAZODONE HCL 100 MG PO TABS: 100.0000 mg | ORAL_TABLET | Freq: Every day | ORAL | 1 refills | Status: AC

## 2019-08-02 MED ORDER — GABAPENTIN 300 MG PO CAPS: 300.0000 mg | ORAL_CAPSULE | Freq: Three times a day (TID) | ORAL | 1 refills | Status: AC

## 2019-08-02 MED ORDER — OMEPRAZOLE 20 MG PO CPDR: DELAYED_RELEASE_CAPSULE | ORAL | 1 refills | Status: AC

## 2019-08-02 MED ORDER — FUROSEMIDE 20 MG PO TABS: 20.0000 mg | ORAL_TABLET | Freq: Every day | ORAL | 1 refills | Status: DC

## 2019-08-02 MED ORDER — BUSPIRONE HCL 5 MG PO TABS: 5.0000 mg | ORAL_TABLET | Freq: Three times a day (TID) | ORAL | 3 refills | Status: AC

## 2019-08-02 MED ORDER — DULOXETINE HCL 60 MG PO CPEP: ORAL_CAPSULE | ORAL | 1 refills | Status: AC

## 2019-08-02 MED ORDER — BREO ELLIPTA 200-25 MCG/INH IN AEPB: 1.0000 | INHALATION_SPRAY | Freq: Every day | RESPIRATORY_TRACT | 5 refills | Status: AC

## 2019-08-02 MED ORDER — BUPROPION HCL ER (SR) 200 MG PO TB12: 200.0000 mg | ORAL_TABLET | Freq: Two times a day (BID) | ORAL | 1 refills | Status: AC

## 2019-08-02 MED ORDER — APIXABAN 5 MG PO TABS: ORAL_TABLET | ORAL | 3 refills | Status: AC

## 2019-08-02 NOTE — Progress Notes (Signed)
BP 138/73 (BP Location: Left Arm, Patient Position: Sitting, Cuff Size: Large)   Pulse 84   Temp 98 F (36.7 C) (Oral)   LMP 07/01/2013 Comment: tubal  SpO2 93%    Subjective:    Patient ID: Shirley Rogers, female    DOB: 08-09-1965, 54 y.o.   MRN: [id number]  HPI: Shirley Rogers is a 54 y.o. female  Chief Complaint  Patient presents with  . Knee Pain    wants to ask about a cortisone shot in left knee   KNEE PAIN- needs a knee replacement, but needs to lose 25lbs to be able to have it done Duration: chronic Involved knee: bilateral L>>R Mechanism of injury: unknown Location:diffuse Onset: gradual Severity: severe  Quality:  dull and aching Frequency: constant Radiation: no Aggravating factors: weight bearing, walking, running and stairs  Alleviating factors: ice, physical therapy, HEP, APAP, NSAIDs, brace and crutches  Status: worse Treatments attempted: steroid injection, rest, ice, heat, APAP, ibuprofen, aleve, physical therapy and HEP  Relief with NSAIDs?:  no Weakness with weight bearing or walking: yes Sensation of giving way: yes Locking: yes Popping: yes Bruising: yes Swelling: yes Redness: no Paresthesias/decreased sensation: no Fevers: no  HYPERTENSION Hypertension status: controlled  Satisfied with current treatment? yes Duration of hypertension: chronic BP monitoring frequency:  not checking BP medication side effects:  no Medication compliance: excellent compliance Aspirin: no Recurrent headaches: no Visual changes: no Palpitations: no Dyspnea: no Chest pain: no Lower extremity edema: no Dizzy/lightheaded: no  DEPRESSION Mood status: controlled Satisfied with current treatment?: yes Symptom severity: mild  Duration of current treatment : chronic Side effects: no Medication compliance: excellent compliance Psychotherapy/counseling: no  Depressed mood: no Anxious mood: no Anhedonia: no Significant weight loss or gain: no Insomnia: no   Fatigue: yes Feelings of worthlessness or guilt: no Impaired concentration/indecisiveness: no Suicidal ideations: no Hopelessness: no Crying spells: no Depression screen [facility] 2/9 07/27/2018 05/02/2018 11/16/2017 04/04/2017 12/13/2016  Decreased Interest 2 1 2  0 0  Down, Depressed, Hopeless 0 1 1 0 0  PHQ - 2 Score 2 2 3  0 0  Altered sleeping 2 2 0 - 1  Tired, decreased energy 2 2 3  - 0  Change in appetite 2 0 2 - 0  Feeling bad or failure about yourself  0 0 0 - 0  Trouble concentrating 1 2 0 - 0  Moving slowly or fidgety/restless 0 0 1 - 0  Suicidal thoughts 0 0 0 - 0  PHQ-9 Score 9 8 9  - 1  Difficult doing work/chores Somewhat difficult Not difficult at all Somewhat difficult - -  Some recent data might be hidden     Relevant past medical, surgical, family and social history reviewed and updated as indicated. Interim medical history since our last visit reviewed. Allergies and medications reviewed and updated.  Review of Systems  Constitutional: Negative.   Respiratory: Negative.   Cardiovascular: Negative.   Musculoskeletal: Positive for arthralgias, gait problem and joint swelling. Negative for back pain, myalgias, neck pain and neck stiffness.  Skin: Negative.   Hematological: Negative.   Psychiatric/Behavioral: Negative.     Per HPI unless specifically indicated above     Objective:    BP 138/73 (BP Location: Left Arm, Patient Position: Sitting, Cuff Size: Large)   Pulse 84   Temp 98 F (36.7 C) (Oral)   LMP 07/01/2013 Comment: tubal  SpO2 93%   Wt Readings from Last 3 Encounters:  07/09/19 253 lb (114.8 kg)  03/22/19 252 lb (114.3 kg)  02/16/19 252 lb (114.3 kg)    Physical Exam Vitals and nursing note reviewed.  Constitutional:      General: She is not in acute distress.    Appearance: Normal appearance. She is not ill-appearing, toxic-appearing or diaphoretic.  HENT:     Head: Normocephalic and atraumatic.     Right Ear: External ear normal.     Left  Ear: External ear normal.     Nose: Nose normal.     Mouth/Throat:     Mouth: Mucous membranes are moist.     Pharynx: Oropharynx is clear.  Eyes:     General: No scleral icterus.       Right eye: No discharge.        Left eye: No discharge.     Extraocular Movements: Extraocular movements intact.     Conjunctiva/sclera: Conjunctivae normal.     Pupils: Pupils are equal, round, and reactive to light.  Cardiovascular:     Rate and Rhythm: Normal rate and regular rhythm.     Pulses: Normal pulses.     Heart sounds: Normal heart sounds. No murmur. No friction rub. No gallop.   Pulmonary:     Effort: Pulmonary effort is normal. No respiratory distress.     Breath sounds: Normal breath sounds. No stridor. No wheezing, rhonchi or rales.  Chest:     Chest wall: No tenderness.  Musculoskeletal:        General: Normal range of motion.     Cervical back: Normal range of motion and neck supple.  Skin:    General: Skin is warm and dry.     Capillary Refill: Capillary refill takes less than 2 seconds.     Coloration: Skin is not jaundiced or pale.     Findings: No bruising, erythema, lesion or rash.  Neurological:     General: No focal deficit present.     Mental Status: She is alert and oriented to person, place, and time. Mental status is at baseline.  Psychiatric:        Mood and Affect: Mood normal.        Behavior: Behavior normal.        Thought Content: Thought content normal.        Judgment: Judgment normal.     Results for orders placed or performed in visit on 08/02/19  Microscopic Examination   URINE  Result Value Ref Range   WBC, UA 6-10 (A) 0 - 5 /hpf   RBC 0-2 0 - 2 /hpf   Epithelial Cells (non renal) 0-10 0 - 10 /hpf   Bacteria, UA Moderate (A) None seen/Few  Urine Culture, Reflex   URINE  Result Value Ref Range   Urine Culture, Routine Final report    Organism ID, Bacteria No growth   Bayer DCA Hb A1c Waived  Result Value Ref Range   HB A1C (BAYER DCA -  WAIVED) 5.2 <7.0 %  CBC with Differential/Platelet  Result Value Ref Range   WBC 5.6 3.4 - 10.8 x10E3/uL   RBC 4.49 3.77 - 5.28 x10E6/uL   Hemoglobin 13.9 11.1 - 15.9 g/dL   Hematocrit 40.4 34.0 - 46.6 %   MCV 90 79 - 97 fL   MCH 31.0 26.6 - 33.0 pg   MCHC 34.4 31.5 - 35.7 g/dL   RDW 12.1 11.7 - 15.4 %   Platelets 255 150 - 450 x10E3/uL   Neutrophils 53 Not Estab. %   Lymphs 35  Not Estab. %   Monocytes 9 Not Estab. %   Eos 2 Not Estab. %   Basos 1 Not Estab. %   Neutrophils Absolute 3.0 1.4 - 7.0 x10E3/uL   Lymphocytes Absolute 1.9 0.7 - 3.1 x10E3/uL   Monocytes Absolute 0.5 0.1 - 0.9 x10E3/uL   EOS (ABSOLUTE) 0.1 0.0 - 0.4 x10E3/uL   Basophils Absolute 0.0 0.0 - 0.2 x10E3/uL   Immature Granulocytes 0 Not Estab. %   Immature Grans (Abs) 0.0 0.0 - 0.1 x10E3/uL  Comprehensive metabolic panel  Result Value Ref Range   Glucose 86 65 - 99 mg/dL   BUN 19 6 - 24 mg/dL   Creatinine, Ser 0.95 0.57 - 1.00 mg/dL   GFR calc non Af Amer 68 >59 mL/min/1.73   GFR calc Af Amer 78 >59 mL/min/1.73   BUN/Creatinine Ratio 20 9 - 23   Sodium 141 134 - 144 mmol/L   Potassium 4.4 3.5 - 5.2 mmol/L   Chloride 98 96 - 106 mmol/L   CO2 28 20 - 29 mmol/L   Calcium 9.6 8.7 - 10.2 mg/dL   Total Protein 6.9 6.0 - 8.5 g/dL   Albumin 4.6 3.8 - 4.9 g/dL   Globulin, Total 2.3 1.5 - 4.5 g/dL   Albumin/Globulin Ratio 2.0 1.2 - 2.2   Bilirubin Total 0.2 0.0 - 1.2 mg/dL   Alkaline Phosphatase 61 39 - 117 IU/L   AST 16 0 - 40 IU/L   ALT 11 0 - 32 IU/L  Lipid Panel w/o Chol/HDL Ratio  Result Value Ref Range   Cholesterol, Total 237 (H) 100 - 199 mg/dL   Triglycerides 148 0 - 149 mg/dL   HDL 50 >39 mg/dL   VLDL Cholesterol Cal 27 5 - 40 mg/dL   LDL Chol Calc (NIH) 160 (H) 0 - 99 mg/dL  Microalbumin, Urine Waived  Result Value Ref Range   Microalb, Ur Waived 80 (H) 0 - 19 mg/L   Creatinine, Urine Waived 100 10 - 300 mg/dL   Microalb/Creat Ratio 30-300 (H) <30 mg/g  TSH  Result Value Ref Range   TSH  1.360 0.450 - 4.500 uIU/mL  UA/M w/rflx Culture, Routine   Specimen: Urine   URINE  Result Value Ref Range   Specific Gravity, UA >1.030 (H) 1.005 - 1.030   pH, UA 7.0 5.0 - 7.5   Color, UA Yellow Yellow   Appearance Ur Clear Clear   Leukocytes,UA Negative Negative   Protein,UA Trace (A) Negative/Trace   Glucose, UA Negative Negative   Ketones, UA Negative Negative   RBC, UA Negative Negative   Bilirubin, UA Negative Negative   Urobilinogen, Ur 2.0 (H) 0.2 - 1.0 mg/dL   Nitrite, UA Negative Negative   Microscopic Examination See below:    Urinalysis Reflex Comment       Assessment & Plan:   Problem List Items Addressed This Visit      Cardiovascular and Mediastinum   Behcet's disease ([place])    Stable. Continue to follow with rheumatology. Call with any concerns.       Relevant Medications   apixaban (ELIQUIS) 5 MG TABS tablet   furosemide (LASIX) 20 MG tablet   verapamil (CALAN-SR) 240 MG CR tablet   Other Relevant Orders   Bayer DCA Hb A1c Waived (Completed)   CBC with Differential/Platelet (Completed)   Comprehensive metabolic panel (Completed)   TSH (Completed)   UA/M w/rflx Culture, Routine (Completed)     Genitourinary   Benign hypertensive renal disease  Under good control on current regimen. Continue current regimen. Continue to monitor. Call with any concerns. Refills given. Labs drawn today.       Relevant Orders   Bayer DCA Hb A1c Waived (Completed)   CBC with Differential/Platelet (Completed)   Comprehensive metabolic panel (Completed)   Microalbumin, Urine Waived (Completed)   TSH (Completed)   UA/M w/rflx Culture, Routine (Completed)     Other   Lupus (HCC)    Stable. Continue to follow with rheumatology. Call with any concerns.       Relevant Orders   Bayer DCA Hb A1c Waived (Completed)   CBC with Differential/Platelet (Completed)   Comprehensive metabolic panel (Completed)   TSH (Completed)   UA/M w/rflx Culture, Routine (Completed)    History of stroke    Stable. Continue to monitor. Call with any concerns.       Relevant Orders   Bayer DCA Hb A1c Waived (Completed)   CBC with Differential/Platelet (Completed)   Comprehensive metabolic panel (Completed)   TSH (Completed)   UA/M w/rflx Culture, Routine (Completed)   Depression    Under good control on current regimen. Continue current regimen. Continue to monitor. Call with any concerns. Refills given. Labs drawn today.       Relevant Medications   buPROPion (WELLBUTRIN SR) 200 MG 12 hr tablet   busPIRone (BUSPAR) 5 MG tablet   DULoxetine (CYMBALTA) 60 MG capsule   traZODone (DESYREL) 100 MG tablet   Other Relevant Orders   Bayer DCA Hb A1c Waived (Completed)   CBC with Differential/Platelet (Completed)   Comprehensive metabolic panel (Completed)   TSH (Completed)   UA/M w/rflx Culture, Routine (Completed)   Morbid obesity ([place])    Will restart saxenda- knowing that we may not be able to push the dose too high. Continue to monitor. Recheck 1 month.       Relevant Orders   Bayer DCA Hb A1c Waived (Completed)   CBC with Differential/Platelet (Completed)   Comprehensive metabolic panel (Completed)   TSH (Completed)   UA/M w/rflx Culture, Routine (Completed)    Other Visit Diagnoses    Chronic pain of left knee    -  Primary   Steroid injection and fluid drained today. Will work on losing weight to be able to have surgery. Continue to monitor.    Relevant Medications   buPROPion (WELLBUTRIN SR) 200 MG 12 hr tablet   DULoxetine (CYMBALTA) 60 MG capsule   naproxen (NAPROSYN) 500 MG tablet   traZODone (DESYREL) 100 MG tablet   gabapentin (NEURONTIN) 300 MG capsule   Screening for cholesterol level       Labs drawn today. Await results.    Relevant Orders   Lipid Panel w/o Chol/HDL Ratio (Completed)   TSH (Completed)   UA/M w/rflx Culture, Routine (Completed)      Procedure: Left  Knee Intraarticular Steroid Injection        Diagnosis:   ICD-10-CM     1. Chronic pain of left knee  M25.562    G89.29    Steroid injection and fluid drained today. Will work on losing weight to be able to have surgery. Continue to monitor.   2. Benign hypertensive renal disease  I12.9 Bayer DCA Hb A1c Waived    CBC with Differential/Platelet    Comprehensive metabolic panel    Microalbumin, Urine Waived    TSH    UA/M w/rflx Culture, Routine  3. Lupus ([place])  M32.9 Bayer DCA Hb A1c Waived    CBC  with Differential/Platelet    Comprehensive metabolic panel    TSH    UA/M w/rflx Culture, Routine  4. History of stroke  Z86.73 Bayer DCA Hb A1c Waived    CBC with Differential/Platelet    Comprehensive metabolic panel    TSH    UA/M w/rflx Culture, Routine  5. Moderate episode of recurrent major depressive disorder (HCC)  F33.1 Bayer DCA Hb A1c Waived    CBC with Differential/Platelet    Comprehensive metabolic panel    TSH    UA/M w/rflx Culture, Routine  6. Behcet's disease ([place])  M35.2 Bayer DCA Hb A1c Waived    CBC with Differential/Platelet    Comprehensive metabolic panel    TSH    UA/M w/rflx Culture, Routine  7. Morbid obesity ([place])  E66.01 Bayer DCA Hb A1c Waived    CBC with Differential/Platelet    Comprehensive metabolic panel    TSH    UA/M w/rflx Culture, Routine  8. Screening for cholesterol level  Z13.220 Lipid Panel w/o Chol/HDL Ratio    TSH    UA/M w/rflx Culture, Routine   Labs drawn today. Await results.     Physician: MJ Consent:  Risks, benefits, and alternative treatments discussed and all questions were answered.  Patient elected to proceed and verbal consent obtained.  Description: Area prepped and draped using  semi-sterile technique.  Using a anterior/lateral approach, 96mL of serosanguinous fluid drained from the knee. Then a mixture of 4 cc of  1% lidocaine & 1 cc of Kenalog 40 was injected into knee joint.  A bandage was then placed over the injection site Complications: none Post Procedure Instructions: Wound care  instructions discussed and patient was instructed to keep area clean and dry.  Signs and symptoms of infection discussed, patient agrees to contact the office ASAP should they occur.  Follow Up: Return in about 4 weeks (around 08/30/2019).   Follow up plan: Return in about 4 weeks (around 08/30/2019).

## 2019-08-03 LAB — COMPREHENSIVE METABOLIC PANEL
ALT: 11 IU/L (ref 0–32)
AST: 16 IU/L (ref 0–40)
Albumin/Globulin Ratio: 2 (ref 1.2–2.2)
Albumin: 4.6 g/dL (ref 3.8–4.9)
Alkaline Phosphatase: 61 IU/L (ref 39–117)
BUN/Creatinine Ratio: 20 (ref 9–23)
BUN: 19 mg/dL (ref 6–24)
Bilirubin Total: 0.2 mg/dL (ref 0.0–1.2)
CO2: 28 mmol/L (ref 20–29)
Calcium: 9.6 mg/dL (ref 8.7–10.2)
Chloride: 98 mmol/L (ref 96–106)
Creatinine, Ser: 0.95 mg/dL (ref 0.57–1.00)
GFR calc Af Amer: 78 mL/min/{1.73_m2} (ref >59)
GFR calc non Af Amer: 68 mL/min/{1.73_m2} (ref >59)
Globulin, Total: 2.3 g/dL (ref 1.5–4.5)
Glucose: 86 mg/dL (ref 65–99)
Potassium: 4.4 mmol/L (ref 3.5–5.2)
Sodium: 141 mmol/L (ref 134–144)
Total Protein: 6.9 g/dL (ref 6.0–8.5)

## 2019-08-03 LAB — CBC WITH DIFFERENTIAL/PLATELET
Basophils Absolute: 0 10*3/uL (ref 0.0–0.2)
Basos: 1 %
EOS (ABSOLUTE): 0.1 10*3/uL (ref 0.0–0.4)
Eos: 2 %
Hematocrit: 40.4 % (ref 34.0–46.6)
Hemoglobin: 13.9 g/dL (ref 11.1–15.9)
Immature Grans (Abs): 0 10*3/uL (ref 0.0–0.1)
Immature Granulocytes: 0 %
Lymphocytes Absolute: 1.9 10*3/uL (ref 0.7–3.1)
Lymphs: 35 %
MCH: 31 pg (ref 26.6–33.0)
MCHC: 34.4 g/dL (ref 31.5–35.7)
MCV: 90 fL (ref 79–97)
Monocytes Absolute: 0.5 10*3/uL (ref 0.1–0.9)
Monocytes: 9 %
Neutrophils Absolute: 3 10*3/uL (ref 1.4–7.0)
Neutrophils: 53 %
Platelets: 255 10*3/uL (ref 150–450)
RBC: 4.49 x10E6/uL (ref 3.77–5.28)
RDW: 12.1 % (ref 11.7–15.4)
WBC: 5.6 10*3/uL (ref 3.4–10.8)

## 2019-08-03 LAB — LIPID PANEL W/O CHOL/HDL RATIO
Cholesterol, Total: 237 mg/dL — ABNORMAL HIGH (ref 100–199)
HDL: 50 mg/dL (ref >39)
LDL Chol Calc (NIH): 160 mg/dL — ABNORMAL HIGH (ref 0–99)
Triglycerides: 148 mg/dL (ref 0–149)
VLDL Cholesterol Cal: 27 mg/dL (ref 5–40)

## 2019-08-03 LAB — TSH: TSH: 1.36 u[IU]/mL (ref 0.450–4.500)

## 2019-08-04 ENCOUNTER — Encounter: Payer: Self-pay | Admitting: Family Medicine

## 2019-08-04 LAB — UA/M W/RFLX CULTURE, ROUTINE
Bilirubin, UA: NEGATIVE
Glucose, UA: NEGATIVE
Ketones, UA: NEGATIVE
Leukocytes,UA: NEGATIVE
Nitrite, UA: NEGATIVE
RBC, UA: NEGATIVE
Specific Gravity, UA: >1.03 — ABNORMAL HIGH (ref 1.005–1.030)
Urobilinogen, Ur: 2 mg/dL — ABNORMAL HIGH (ref 0.2–1.0)
pH, UA: 7 (ref 5.0–7.5)

## 2019-08-04 LAB — BAYER DCA HB A1C WAIVED: HB A1C (BAYER DCA - WAIVED): 5.2 % (ref <7.0)

## 2019-08-04 LAB — MICROSCOPIC EXAMINATION

## 2019-08-04 LAB — MICROALBUMIN, URINE WAIVED
Creatinine, Urine Waived: 100 mg/dL (ref 10–300)
Microalb, Ur Waived: 80 mg/L — ABNORMAL HIGH (ref 0–19)

## 2019-08-04 LAB — URINE CULTURE, REFLEX: Organism ID, Bacteria: NO GROWTH

## 2019-08-04 NOTE — Assessment & Plan Note (Signed)
Will restart saxenda- knowing that we may not be able to push the dose too high. Continue to monitor. Recheck 1 month.

## 2019-08-04 NOTE — Assessment & Plan Note (Signed)
Stable. Continue to follow with rheumatology. Call with any concerns.  

## 2019-08-04 NOTE — Assessment & Plan Note (Signed)
Under good control on current regimen. Continue current regimen. Continue to monitor. Call with any concerns. Refills given. Labs drawn today.   

## 2019-08-04 NOTE — Assessment & Plan Note (Signed)
Stable. Continue to monitor. Call with any concerns.  ?

## 2019-08-09 ENCOUNTER — Ambulatory Visit (INDEPENDENT_AMBULATORY_CARE_PROVIDER_SITE_OTHER): Payer: BC Managed Care – PPO | Admitting: Family Medicine

## 2019-08-09 ENCOUNTER — Encounter: Payer: Self-pay | Admitting: Family Medicine

## 2019-08-09 ENCOUNTER — Other Ambulatory Visit: Payer: Self-pay

## 2019-08-09 VITALS — BP 139/86 | HR 83 | Temp 98.6°F

## 2019-08-09 DIAGNOSIS — M1711 Unilateral primary osteoarthritis, right knee: Secondary | ICD-10-CM | POA: Diagnosis not present

## 2019-08-09 NOTE — Progress Notes (Signed)
BP 139/86 (BP Location: Right Arm, Patient Position: Sitting, Cuff Size: Normal)   Pulse 83   Temp 98.6 F (37 C) (Oral)   LMP 07/01/2013 Comment: tubal  SpO2 96%    Subjective:    Patient ID: [name], female    DOB: 04-Jan-1966, 54 y.o.   MRN: [id number]  HPI: RAKEL SUMEY is a 54 y.o. female  Chief Complaint  Patient presents with  . Knee Pain    cortisone shot   Tayiah returns today for a steroid injection in her R knee. She did well with the injection in the L knee last week. Pain is aching and sore. Tends to catch. Chronic, Better with rest and worse with movement. No other concerns or complaints at this time.   Relevant past medical, surgical, family and social history reviewed and updated as indicated. Interim medical history since our last visit reviewed. Allergies and medications reviewed and updated.  Review of Systems  Constitutional: Negative.   Respiratory: Negative.   Cardiovascular: Negative.   Musculoskeletal: Positive for arthralgias, gait problem and joint swelling. Negative for back pain, myalgias, neck pain and neck stiffness.  Skin: Negative.   Psychiatric/Behavioral: Negative.     Per HPI unless specifically indicated above     Objective:    BP 139/86 (BP Location: Right Arm, Patient Position: Sitting, Cuff Size: Normal)   Pulse 83   Temp 98.6 F (37 C) (Oral)   LMP 07/01/2013 Comment: tubal  SpO2 96%   Wt Readings from Last 3 Encounters:  07/09/19 253 lb (114.8 kg)  03/22/19 252 lb (114.3 kg)  02/16/19 252 lb (114.3 kg)    Physical Exam Vitals and nursing note reviewed.  Constitutional:      General: She is not in acute distress.    Appearance: Normal appearance. She is not ill-appearing, toxic-appearing or diaphoretic.  HENT:     Head: Normocephalic and atraumatic.     Right Ear: External ear normal.     Left Ear: External ear normal.     Nose: Nose normal.     Mouth/Throat:     Mouth: Mucous membranes are moist.   Pharynx: Oropharynx is clear.  Eyes:     General: No scleral icterus.       Right eye: No discharge.        Left eye: No discharge.     Extraocular Movements: Extraocular movements intact.     Conjunctiva/sclera: Conjunctivae normal.     Pupils: Pupils are equal, round, and reactive to light.  Cardiovascular:     Rate and Rhythm: Normal rate and regular rhythm.     Pulses: Normal pulses.     Heart sounds: Normal heart sounds. No murmur. No friction rub. No gallop.   Pulmonary:     Effort: Pulmonary effort is normal. No respiratory distress.     Breath sounds: Normal breath sounds. No stridor. No wheezing, rhonchi or rales.  Chest:     Chest wall: No tenderness.  Musculoskeletal:        General: Normal range of motion.     Cervical back: Normal range of motion and neck supple.     Comments: Tenderness along joint line   Skin:    General: Skin is warm and dry.     Capillary Refill: Capillary refill takes less than 2 seconds.     Coloration: Skin is not jaundiced or pale.     Findings: No bruising, erythema, lesion or rash.  Neurological:  General: No focal deficit present.     Mental Status: She is alert and oriented to person, place, and time. Mental status is at baseline.  Psychiatric:        Mood and Affect: Mood normal.        Behavior: Behavior normal.        Thought Content: Thought content normal.        Judgment: Judgment normal.     Results for orders placed or performed in visit on 08/02/19  Microscopic Examination   URINE  Result Value Ref Range   WBC, UA 6-10 (A) 0 - 5 /hpf   RBC 0-2 0 - 2 /hpf   Epithelial Cells (non renal) 0-10 0 - 10 /hpf   Bacteria, UA Moderate (A) None seen/Few  Urine Culture, Reflex   URINE  Result Value Ref Range   Urine Culture, Routine Final report    Organism ID, Bacteria No growth   Bayer DCA Hb A1c Waived  Result Value Ref Range   HB A1C (BAYER DCA - WAIVED) 5.2 <7.0 %  CBC with Differential/Platelet  Result Value Ref  Range   WBC 5.6 3.4 - 10.8 x10E3/uL   RBC 4.49 3.77 - 5.28 x10E6/uL   Hemoglobin 13.9 11.1 - 15.9 g/dL   Hematocrit 40.4 34.0 - 46.6 %   MCV 90 79 - 97 fL   MCH 31.0 26.6 - 33.0 pg   MCHC 34.4 31.5 - 35.7 g/dL   RDW 12.1 11.7 - 15.4 %   Platelets 255 150 - 450 x10E3/uL   Neutrophils 53 Not Estab. %   Lymphs 35 Not Estab. %   Monocytes 9 Not Estab. %   Eos 2 Not Estab. %   Basos 1 Not Estab. %   Neutrophils Absolute 3.0 1.4 - 7.0 x10E3/uL   Lymphocytes Absolute 1.9 0.7 - 3.1 x10E3/uL   Monocytes Absolute 0.5 0.1 - 0.9 x10E3/uL   EOS (ABSOLUTE) 0.1 0.0 - 0.4 x10E3/uL   Basophils Absolute 0.0 0.0 - 0.2 x10E3/uL   Immature Granulocytes 0 Not Estab. %   Immature Grans (Abs) 0.0 0.0 - 0.1 x10E3/uL  Comprehensive metabolic panel  Result Value Ref Range   Glucose 86 65 - 99 mg/dL   BUN 19 6 - 24 mg/dL   Creatinine, Ser 0.95 0.57 - 1.00 mg/dL   GFR calc non Af Amer 68 >59 mL/min/1.73   GFR calc Af Amer 78 >59 mL/min/1.73   BUN/Creatinine Ratio 20 9 - 23   Sodium 141 134 - 144 mmol/L   Potassium 4.4 3.5 - 5.2 mmol/L   Chloride 98 96 - 106 mmol/L   CO2 28 20 - 29 mmol/L   Calcium 9.6 8.7 - 10.2 mg/dL   Total Protein 6.9 6.0 - 8.5 g/dL   Albumin 4.6 3.8 - 4.9 g/dL   Globulin, Total 2.3 1.5 - 4.5 g/dL   Albumin/Globulin Ratio 2.0 1.2 - 2.2   Bilirubin Total 0.2 0.0 - 1.2 mg/dL   Alkaline Phosphatase 61 39 - 117 IU/L   AST 16 0 - 40 IU/L   ALT 11 0 - 32 IU/L  Lipid Panel w/o Chol/HDL Ratio  Result Value Ref Range   Cholesterol, Total 237 (H) 100 - 199 mg/dL   Triglycerides 148 0 - 149 mg/dL   HDL 50 >39 mg/dL   VLDL Cholesterol Cal 27 5 - 40 mg/dL   LDL Chol Calc (NIH) 160 (H) 0 - 99 mg/dL  Microalbumin, Urine Waived  Result Value Ref Range  Microalb, Ur Waived 80 (H) 0 - 19 mg/L   Creatinine, Urine Waived 100 10 - 300 mg/dL   Microalb/Creat Ratio 30-300 (H) <30 mg/g  TSH  Result Value Ref Range   TSH 1.360 0.450 - 4.500 uIU/mL  UA/M w/rflx Culture, Routine   Specimen:  Urine   URINE  Result Value Ref Range   Specific Gravity, UA >1.030 (H) 1.005 - 1.030   pH, UA 7.0 5.0 - 7.5   Color, UA Yellow Yellow   Appearance Ur Clear Clear   Leukocytes,UA Negative Negative   Protein,UA Trace (A) Negative/Trace   Glucose, UA Negative Negative   Ketones, UA Negative Negative   RBC, UA Negative Negative   Bilirubin, UA Negative Negative   Urobilinogen, Ur 2.0 (H) 0.2 - 1.0 mg/dL   Nitrite, UA Negative Negative   Microscopic Examination See below:    Urinalysis Reflex Comment       Assessment & Plan:   Problem List Items Addressed This Visit    None    Visit Diagnoses    Osteoarthritis of right knee, unspecified osteoarthritis type    -  Primary   Steroid injection given today as below. Call with any concerns.      Procedure: Right  Knee Intraarticular Steroid Injection        Diagnosis:   ICD-10-CM   1. Osteoarthritis of right knee, unspecified osteoarthritis type  M17.11    Steroid injection given today as below. Call with any concerns.     Physician: MJ Consent:  Risks, benefits, and alternative treatments discussed and all questions were answered.  Patient elected to proceed and verbal consent obtained.  Description: Area prepped and draped using  semi-sterile technique.  UUsing a anterior/lateral approach, a mixture of 4 cc of  1% lidocaine & 1 cc of Kenalog 40 was injected into knee joint.  A bandage was then placed over the injection site. Complications: none Post Procedure Instructions: Wound care instructions discussed and patient was instructed to keep area clean and dry.  Signs and symptoms of infection discussed, patient agrees to contact the office ASAP should they occur.  Follow Up: Return if symptoms worsen or fail to improve.    Follow up plan: Return if symptoms worsen or fail to improve.

## 2019-08-14 ENCOUNTER — Encounter: Payer: Self-pay | Admitting: Emergency Medicine

## 2019-08-14 ENCOUNTER — Emergency Department: Payer: No Typology Code available for payment source

## 2019-08-14 ENCOUNTER — Emergency Department
Admission: EM | Admit: 2019-08-14 | Discharge: 2019-08-14 | Disposition: A | Discharge status: Home / Self Care | Payer: No Typology Code available for payment source | Attending: Emergency Medicine | Admitting: Emergency Medicine

## 2019-08-14 ENCOUNTER — Other Ambulatory Visit: Payer: Self-pay

## 2019-08-14 ENCOUNTER — Emergency Department: Payer: BC Managed Care – PPO

## 2019-08-14 DIAGNOSIS — Y929 Unspecified place or not applicable: Secondary | ICD-10-CM | POA: Insufficient documentation

## 2019-08-14 DIAGNOSIS — I10 Essential (primary) hypertension: Secondary | ICD-10-CM | POA: Insufficient documentation

## 2019-08-14 DIAGNOSIS — M545 Low back pain: Secondary | ICD-10-CM | POA: Diagnosis not present

## 2019-08-14 DIAGNOSIS — Y990 Civilian activity done for income or pay: Secondary | ICD-10-CM | POA: Diagnosis not present

## 2019-08-14 DIAGNOSIS — Z7901 Long term (current) use of anticoagulants: Secondary | ICD-10-CM | POA: Diagnosis not present

## 2019-08-14 DIAGNOSIS — S0990XA Unspecified injury of head, initial encounter: Secondary | ICD-10-CM | POA: Diagnosis present

## 2019-08-14 DIAGNOSIS — S060X0A Concussion without loss of consciousness, initial encounter: Secondary | ICD-10-CM | POA: Diagnosis not present

## 2019-08-14 DIAGNOSIS — Z9049 Acquired absence of other specified parts of digestive tract: Secondary | ICD-10-CM | POA: Diagnosis not present

## 2019-08-14 DIAGNOSIS — Y939 Activity, unspecified: Secondary | ICD-10-CM | POA: Diagnosis not present

## 2019-08-14 DIAGNOSIS — M542 Cervicalgia: Secondary | ICD-10-CM | POA: Insufficient documentation

## 2019-08-14 DIAGNOSIS — W01198A Fall on same level from slipping, tripping and stumbling with subsequent striking against other object, initial encounter: Secondary | ICD-10-CM | POA: Insufficient documentation

## 2019-08-14 DIAGNOSIS — Z8673 Personal history of transient ischemic attack (TIA), and cerebral infarction without residual deficits: Secondary | ICD-10-CM | POA: Insufficient documentation

## 2019-08-14 DIAGNOSIS — Z91040 Latex allergy status: Secondary | ICD-10-CM | POA: Diagnosis not present

## 2019-08-14 DIAGNOSIS — Z79899 Other long term (current) drug therapy: Secondary | ICD-10-CM | POA: Diagnosis not present

## 2019-08-14 DIAGNOSIS — M546 Pain in thoracic spine: Secondary | ICD-10-CM | POA: Diagnosis not present

## 2019-08-14 DIAGNOSIS — F17210 Nicotine dependence, cigarettes, uncomplicated: Secondary | ICD-10-CM | POA: Diagnosis not present

## 2019-08-14 MED ORDER — PROMETHAZINE HCL 25 MG/ML IJ SOLN
12.5000 mg | Freq: Once | INTRAMUSCULAR | Status: AC
  Administered 2019-08-14: 14:00:00 12.5 mg via INTRAVENOUS
  Filled 2019-08-14: qty 1

## 2019-08-14 MED ORDER — ONDANSETRON HCL 4 MG/2ML IJ SOLN
INTRAMUSCULAR | Status: AC
  Filled 2019-08-14: qty 2

## 2019-08-14 MED ORDER — TRAMADOL HCL 50 MG PO TABS
50.0000 mg | ORAL_TABLET | Freq: Once | ORAL | Status: AC
  Administered 2019-08-14: 50 mg via ORAL
  Filled 2019-08-14: qty 1

## 2019-08-14 MED ORDER — TRAMADOL HCL 50 MG PO TABS: 50.0000 mg | ORAL_TABLET | Freq: Four times a day (QID) | ORAL | 0 refills | Status: DC | PRN

## 2019-08-14 MED ORDER — ONDANSETRON HCL 4 MG/2ML IJ SOLN
4.0000 mg | Freq: Once | INTRAMUSCULAR | Status: AC
  Administered 2019-08-14: 4 mg via INTRAVENOUS

## 2019-08-14 MED ORDER — MORPHINE SULFATE (PF) 2 MG/ML IV SOLN
2.0000 mg | Freq: Once | INTRAVENOUS | Status: AC
  Administered 2019-08-14: 2 mg via INTRAVENOUS
  Filled 2019-08-14: qty 1

## 2019-08-14 NOTE — ED Notes (Signed)
C-collar placed per this RN.

## 2019-08-14 NOTE — ED Provider Notes (Signed)
[facility] Emergency Department Provider Note  ____________________________________________   First MD Initiated Contact with Patient 08/14/19 1333     (approximate)  I have reviewed the triage vital signs and the nursing notes.   HISTORY  Chief Complaint Fall    HPI Shirley Rogers is a 54 y.o. female with below list of previous medical conditions presents to the emergency department following an accidental fall with resultant head injury.  Patient admits to headache and dizziness at present.  Patient also admits to posterior neck upper back and lower back pain.  Patient states that her current pain score is 8 out of 10.  Patient does admit to 6 previous concussions.  Of considerable deficiency patient is currently taking Eliquis.        Past Medical History:  Diagnosis Date  . Anxiety    on xanax in the past  . ASCVD (arteriosclerotic cardiovascular disease)    carotids  . Barrett esophagus   . 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 Carotid atherosclerosis 2003   on ultrasound  . Collagen vascular disease ([place])   . Colon polyp   . Complicated migraine    with facial drooping  . Concussion    1974,1982,1987,2016,2016  . DDD (degenerative disc disease), lumbar    MRI 2008, L3-4, L5-S1, spondylotic changes, multilevel facet joint hypertrophic changes  . Depression    on paxil, lexapro, cymbalta in the past  . Diverticulosis   . Dizziness due to old head injury   . Family history of adverse reaction to anesthesia    father - slow to wake  . Fatty liver 2013   On CT abdomen/pelvis  . Fibrocystic breast disease   . Fibromyalgia   . Fracture    left knee medial femoral condyle stress fracture, chondromalacia  . GERD (gastroesophageal reflux disease)   . Hemiplegic migraine    daily  . Hemorrhoids   . History of kidney stones   . Hyperlipidemia   . Hypertension    no meds currently.  . IBS (irritable bowel syndrome)   . Insomnia chronic  .  Kidney stone 2013   on CT abdomen/pelvis  . 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 Migraine   . Narrowing of intervertebral disc space 2011   C5-6  . Normal cardiac stress test 2009   EF 66%  . Pneumonia   . Pulmonary emboli ([place])   . Recurrent sinusitis   . Recurrent UTI    question about IC in the past  . Sleep apnea    CPAP machine broken  . Stroke ([place])    2000 - no deficits  . Umbilical hernia 0000000   on CT abdomen/pelvis  . Varicose veins    right lower leg  . Vascular spasm ([place])   . Vertigo    from concussive disorder.  seeing neuro 03/23/15    Patient Active Problem List   Diagnosis Date Noted  . Osteoarthritis of spine with radiculopathy, cervical region 06/29/2018  . Chronic pain of both knees 11/17/2017  . Morbid obesity ([place]) 11/17/2017  . Body mass index 40.0-44.9, adult ([place]) 11/17/2017  . Unilateral primary osteoarthritis, right knee 07/11/2017  . S/P left knee arthroscopy 06/06/2017  . Stress fracture of femur 05/16/2017  . Chondromalacia patellae, left knee 05/16/2017  . Primary localized osteoarthritis of both knees 04/26/2017  . ASCUS with positive high risk HPV cervical 01/18/2017  .  Increased intracranial pressure 10/06/2016  . Vestibular hypofunction of left ear 09/21/2016  . Intracranial hypertension, benign 09/21/2016  . Chronic daily headache 09/21/2016  . Inflammatory arthritis 08/28/2016  . Nephrolithiasis 07/22/2016  . 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 Behcet's disease ([place]) 04/24/2016  . Microhematuria 03/15/2016  . Pelvic floor dysfunction 03/15/2016  . Urethral caruncle 03/15/2016  . History of pulmonary embolus (PE) 03/11/2016  . Facial droop 02/04/2016  . Right hemiparesis ([place]) 02/04/2016  . Tobacco use 12/03/2015  . ANA positive 10/19/2015  . Transient alteration of awareness 09/24/2015  . Snoring 06/10/2015  . Sleep apnea 06/10/2015  . Bile  duct abnormality 05/12/2015  . 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 Globus sensation 03/30/2015  . Post concussion syndrome 03/13/2015  . Depression 03/13/2015  . Benign hypertensive renal disease   . Barrett esophagus   . Diverticulosis   . Hemiplegic migraine   . Lupus ([place])   . History of stroke     Past Surgical History:  Procedure Laterality Date  . Biopsy Punch Thyroid    . BREAST EXCISIONAL BIOPSY Right 2000   right mass excision - atypical hyperplasia  . BREAST SURGERY Right    atypical hyperplasia  . CHOLECYSTECTOMY N/A 07/24/2015   Procedure: LAPAROSCOPIC CHOLECYSTECTOMY WITH INTRAOPERATIVE CHOLANGIOGRAM;  Surgeon: Leonie Green, MD;  Location: [facility] ORS;  Service: General;  Laterality: N/A;  . COLONOSCOPY W/ BIOPSIES     Removed 5 polyps  . COLONOSCOPY WITH PROPOFOL N/A 03/03/2017   Procedure: COLONOSCOPY WITH PROPOFOL;  Surgeon: [name], MD;  Location: [facility] ENDOSCOPY;  Service: Gastroenterology;  Laterality: N/A;  . COLONOSCOPY WITH PROPOFOL N/A 01/08/2018   Procedure: COLONOSCOPY WITH PROPOFOL;  Surgeon: [name], MD;  Location[facility] ENDOSCOPY;  Service: Gastroenterology;  Laterality: N/A;  . ESOPHAGOGASTRODUODENOSCOPY (EGD) WITH PROPOFOL N/A 01/08/2018   Procedure: ESOPHAGOGASTRODUODENOSCOPY (EGD) WITH PROPOFOL;  Surgeon: [name], MD;  Location: [facility] ENDOSCOPY;  Service: Gastroenterology;  Laterality: N/A;  . KNEE ARTHROSCOPY WITH SUBCHONDROPLASTY Left 05/26/2017   Procedure: LEFT KNEE ARTHROSCOPY WITH SUBCHONDROPLASTY, CHONDROPLASTY, SYNOVECTOMY, POSSIBLE MICROFX;  Surgeon: Leandrew Koyanagi, MD;  Location: [place];  Service: Orthopedics;  Laterality: Left;  . TUBAL LIGATION      Prior to Admission medications   Medication Sig Start Date End Date Taking? Authorizing Provider  apixaban (ELIQUIS) 5 MG TABS tablet TAKE 1 TABLET(5 MG) BY MOUTH TWICE DAILY 08/02/19   Park Liter P, DO  b complex vitamins tablet Take 1 tablet by mouth daily.     [provider]  buPROPion (WELLBUTRIN SR) 200 MG 12 hr tablet Take 1 tablet (200 mg total) by mouth 2 (two) times daily. 08/02/19   Johnson, Megan P, DO  busPIRone (BUSPAR) 5 MG tablet Take 1-3 tablets (5-15 mg total) by mouth 3 (three) times daily. 08/02/19   Johnson, Megan P, DO  calcium-vitamin D (OSCAL WITH D) 500-200 MG-UNIT tablet Take 1 tablet by mouth 3 (three) times daily. 05/26/17   Leandrew Koyanagi, MD  DULoxetine (CYMBALTA) 60 MG capsule TAKE 1 CAPSULE(60 MG) BY MOUTH DAILY 08/02/19   Johnson, Megan P, DO  EPINEPHrine 0.3 mg/0.3 mL IJ SOAJ injection INJECT 0.3 ML( 0.3 MG) INTO THE MUSCLE ONCE 01/25/19   Johnson, Megan P, DO  fluticasone furoate-vilanterol (BREO ELLIPTA) 200-25 MCG/INH AEPB Inhale 1 puff into the lungs daily. 08/02/19   Johnson, Megan P, DO  furosemide (LASIX) 20 MG tablet Take 1 tablet (20 mg total) by mouth daily. 08/02/19  Johnson, Megan P, DO  gabapentin (NEURONTIN) 300 MG capsule Take 1 capsule (300 mg total) by mouth 3 (three) times daily. 08/02/19   Johnson, Megan P, DO  hydroxychloroquine (PLAQUENIL) 200 MG tablet Take by mouth. 10/01/18   [provider]  Insulin Pen Needle 31G X 6 MM MISC 1 each by Does not apply route daily. 05/28/18   Valerie Roys, DO  linaclotide (LINZESS) 290 MCG CAPS capsule Take 1 capsule (290 mcg total) by mouth daily before breakfast. 01/10/18   [name], MD  Magnesium 500 MG TABS Take 1,000 mg by mouth daily.     [provider]  methazolamide (NEPTAZANE) 25 MG tablet Take 1 tablet (25 mg total) by mouth 3 (three) times daily. 04/26/17   Volney American, PA-C  naproxen (NAPROSYN) 500 MG tablet Take 1 tablet (500 mg total) by mouth 2 (two) times daily with a meal. 08/02/19   Johnson, Megan P, DO  omeprazole (PRILOSEC) 20 MG capsule TAKE 1 CAPSULE(20 MG) BY MOUTH AT BEDTIME 08/02/19   Johnson, Megan P, DO  ondansetron (ZOFRAN) 4 MG tablet Take 1 tablet (4 mg total) by mouth every 8 (eight) hours as needed for nausea or  vomiting. 06/11/19   Aundra Dubin, PA-C  SAXENDA 18 MG/3ML SOPN ADMINISTER 3 MG UNDER THE SKIN DAILY 07/19/19   Park Liter P, DO  traMADol (ULTRAM) 50 MG tablet Take 1 tablet (50 mg total) by mouth 3 (three) times daily as needed. 06/11/19   Aundra Dubin, PA-C  traZODone (DESYREL) 100 MG tablet Take 1 tablet (100 mg total) by mouth at bedtime. 08/02/19   Johnson, Megan P, DO  verapamil (CALAN-SR) 240 MG CR tablet TAKE 1 TABLET(240 MG) BY MOUTH DAILY 08/02/19   Park Liter P, DO    Allergies Rocephin [ceftriaxone], Statins, Sulfa antibiotics, Valproate sodium, Compazine [prochlorperazine], Erythromycin, Ace inhibitors, Biaxin [clarithromycin], Doxycycline, Latex, Lidocaine, Meloxicam, Morphine and related, Nitrofuran derivatives, and Sulfamethoxazole-trimethoprim  Family History  Problem Relation Age of Onset  . Other Mother        IBS  . Hypertension Mother   . Heart disease Father   . Diverticulosis Father   . Migraines Father   . Other Son        Ulcerative Colitis, Polonidal cyst  . Parkinson's disease Maternal Grandmother   . Cancer Maternal Grandmother   . Mental illness Paternal Grandmother   . Stroke Paternal Grandmother   . Cancer Paternal Grandmother        brain cancer  . Bipolar disorder Paternal Grandmother   . Stroke Maternal Grandfather   . Cancer Maternal Grandfather        Bladder, Prostate/bladder/prostate  . Heart disease Paternal Grandfather   . Heart attack Paternal Grandfather   . Macular degeneration Other   . Diabetes Brother   . Breast cancer Maternal Aunt     Social History Social History   Tobacco Use  . Smoking status: Current Some Day Smoker    Types: Cigarettes    Last attempt to quit: 06/12/2015    Years since quitting: 4.1  . Smokeless tobacco: Never Used  Substance Use Topics  . Alcohol use: No    Alcohol/week: 0.0 standard drinks  . Drug use: No    Review of Systems Constitutional: No fever/chills Eyes: No visual  changes. ENT: No sore throat. Cardiovascular: Denies chest pain. Respiratory: Denies shortness of breath. Gastrointestinal: No abdominal pain.  No nausea, no vomiting.  No diarrhea.  No constipation. Genitourinary:  Negative for dysuria. Musculoskeletal: Negative for neck pain.  Negative for back pain. Integumentary: Negative for rash. Neurological: Negative for headaches, focal weakness or numbness.   ____________________________________________   PHYSICAL EXAM:  VITAL SIGNS: ED Triage Vitals  Enc Vitals Group     BP 08/14/19 1124 (!) 140/104     Pulse Rate 08/14/19 1124 78     Resp 08/14/19 1124 16     Temp 08/14/19 1124 98.7 F (37.1 C)     Temp Source 08/14/19 1124 Oral     SpO2 08/14/19 1124 100 %     Weight 08/14/19 1125 108.9 kg (240 lb)     Height 08/14/19 1125 1.626 m (5\' 4" )     Head Circumference --      Peak Flow --      Pain Score 08/14/19 1125 8     Pain Loc --      Pain Edu? --      Excl. in [place]? --     Constitutional: Alert and oriented.  Eyes: Conjunctivae are normal.  Head: Atraumatic. Mouth/Throat: Patient is wearing a mask. Neck: No stridor.  No meningeal signs.   Cardiovascular: Normal rate, regular rhythm. Good peripheral circulation. Grossly normal heart sounds. Respiratory: Normal respiratory effort.  No retractions. Gastrointestinal: Soft and nontender. No distention.  Musculoskeletal: No lower extremity tenderness nor edema. No gross deformities of extremities. Neurologic:  Normal speech and language. No gross focal neurologic deficits are appreciated.  Skin:  Skin is warm, dry and intact. Psychiatric: Mood and affect are normal. Speech and behavior are normal.  ____________________________________________   LABS (all labs ordered are listed, but only abnormal results are displayed)  Labs Reviewed - No data to display _______________________________________ RADIOLOGY I, [place], personally viewed and evaluated these images  (plain radiographs) as part of my medical decision making, as well as reviewing the written report by the radiologist.  ED MD interpretation: No acute findings noted on CT head or cervical spine.  Likewise no acute findings noted on thoracic or lumbar spine x-ray.  Official radiology report(s): CT Head Wo Contrast  Result Date: 08/14/2019 CLINICAL DATA:  Patient fell backwards at work hitting the back of her head. Head and neck pain. Past hx head trauma/concussions. Right side stroke. EXAM: CT HEAD WITHOUT CONTRAST CT CERVICAL SPINE WITHOUT CONTRAST TECHNIQUE: Multidetector CT imaging of the head and cervical spine was performed following the standard protocol without intravenous contrast. Multiplanar CT image reconstructions of the cervical spine were also generated. COMPARISON:  None. FINDINGS: CT HEAD FINDINGS Brain: Small focus of hypoattenuation noted along the posterior right external capsule and adjacent basal ganglia, consistent old infarct, indeterminate chronicity, likely old. Well-defined dilated perivascular space along inferior left basal ganglia. No other evidence of an infarct, no parenchymal masses or mass effect, no extra-axial masses or abnormal fluid collections and no intracranial hemorrhage. Ventricles are normal in size and configuration Vascular: No hyperdense vessel or unexpected calcification. Skull: Normal. Negative for fracture or focal lesion. Sinuses/Orbits: Globes and orbits are unremarkable. Visualized sinuses and mastoid air cells are clear. Other: None. CT CERVICAL SPINE FINDINGS Alignment: Mild reversal of the normal cervical lordosis, apex at C5-C6. No spondylolisthesis/subluxation. Skull base and vertebrae: No acute fracture. No primary bone lesion or focal pathologic process. Soft tissues and spinal canal: No prevertebral fluid or swelling. No visible canal hematoma. Disc levels: Moderate loss of disc height with endplate sclerosis osteophytes at C5-C6. Mild loss of disc  height at C6-C7. Mild diffuse spondylotic disc  bulging at C5-C6 with uncovertebral spurring causing mild bilateral neural foraminal narrowing. No other disc bulging. No evidence of a disc herniation. No other stenosis. Upper chest: Enlarged right thyroid lobe with an apparent 1.9 cm nodule. No masses. No enlarged lymph nodes. Lung apices are clear. Other: None. IMPRESSION: HEAD CT 1. Small area of hypoattenuation along the posterior aspect of the right external capsule consistent with a lacunar infarction. This is of unclear chronicity, but likely old. 2. No other significant intracranial abnormality. 3. No skull fracture. CERVICAL CT 1. No fracture or acute finding. 2. Disc degenerative changes at C5-C6. 3. Poorly defined right thyroid nodule fell approximately 1.9 cm. Recommend follow-up ultrasound for further assessment. Electronically Signed   By: Lajean Manes M.D.   On: 08/14/2019 12:17   CT Cervical Spine Wo Contrast  Result Date: 08/14/2019 CLINICAL DATA:  Patient fell backwards at work hitting the back of her head. Head and neck pain. Past hx head trauma/concussions. Right side stroke. EXAM: CT HEAD WITHOUT CONTRAST CT CERVICAL SPINE WITHOUT CONTRAST TECHNIQUE: Multidetector CT imaging of the head and cervical spine was performed following the standard protocol without intravenous contrast. Multiplanar CT image reconstructions of the cervical spine were also generated. COMPARISON:  None. FINDINGS: CT HEAD FINDINGS Brain: Small focus of hypoattenuation noted along the posterior right external capsule and adjacent basal ganglia, consistent old infarct, indeterminate chronicity, likely old. Well-defined dilated perivascular space along inferior left basal ganglia. No other evidence of an infarct, no parenchymal masses or mass effect, no extra-axial masses or abnormal fluid collections and no intracranial hemorrhage. Ventricles are normal in size and configuration Vascular: No hyperdense vessel or  unexpected calcification. Skull: Normal. Negative for fracture or focal lesion. Sinuses/Orbits: Globes and orbits are unremarkable. Visualized sinuses and mastoid air cells are clear. Other: None. CT CERVICAL SPINE FINDINGS Alignment: Mild reversal of the normal cervical lordosis, apex at C5-C6. No spondylolisthesis/subluxation. Skull base and vertebrae: No acute fracture. No primary bone lesion or focal pathologic process. Soft tissues and spinal canal: No prevertebral fluid or swelling. No visible canal hematoma. Disc levels: Moderate loss of disc height with endplate sclerosis osteophytes at C5-C6. Mild loss of disc height at C6-C7. Mild diffuse spondylotic disc bulging at C5-C6 with uncovertebral spurring causing mild bilateral neural foraminal narrowing. No other disc bulging. No evidence of a disc herniation. No other stenosis. Upper chest: Enlarged right thyroid lobe with an apparent 1.9 cm nodule. No masses. No enlarged lymph nodes. Lung apices are clear. Other: None. IMPRESSION: HEAD CT 1. Small area of hypoattenuation along the posterior aspect of the right external capsule consistent with a lacunar infarction. This is of unclear chronicity, but likely old. 2. No other significant intracranial abnormality. 3. No skull fracture. CERVICAL CT 1. No fracture or acute finding. 2. Disc degenerative changes at C5-C6. 3. Poorly defined right thyroid nodule fell approximately 1.9 cm. Recommend follow-up ultrasound for further assessment. Electronically Signed   By: Lajean Manes M.D.   On: 08/14/2019 12:17    Procedures   ____________________________________________   INITIAL IMPRESSION / MDM / [place] / ED COURSE  As part of my medical decision making, I reviewed the following data within the electronic MEDICAL RECORD NUMBER  54 year old female presented with above-stated history and physical exam distant with a concussion.  CT head cervical spine performed to evaluate for intracranial  hemorrhage or fracture are normal.  Likewise x-ray of the lumbar and thoracic spine were done which were also negative.  Patient given  IV morphine and Phenergan in the emergency department secondary to headache stable previous history of migraine.  Patient did achieve pain improvement.     ____________________________________________  FINAL CLINICAL IMPRESSION(S) / ED DIAGNOSES  Final diagnoses:  Concussion without loss of consciousness, initial encounter     MEDICATIONS GIVEN DURING THIS VISIT:  Medications  morphine 2 MG/ML injection 2 mg (has no administration in time range)  promethazine (PHENERGAN) injection 12.5 mg (has no administration in time range)     ED Discharge Orders    None      *Please note:  PRANISHA CHANNEL was evaluated in Emergency Department on 08/14/2019 for the symptoms described in the history of present illness. She was evaluated in the context of the global COVID-19 pandemic, which necessitated consideration that the patient might be at risk for infection with the SARS-CoV-2 virus that causes COVID-19. Institutional protocols and algorithms that pertain to the evaluation of patients at risk for COVID-19 are in a state of rapid change based on information released by regulatory bodies including the CDC and federal and state organizations. These policies and algorithms were followed during the patient's care in the ED.  Some ED evaluations and interventions may be delayed as a result of limited staffing during the pandemic.*  Note:  This document was prepared using Dragon voice recognition software and may include unintentional dictation errors.   Gregor Hams, MD 08/14/19 2047

## 2019-08-14 NOTE — ED Triage Notes (Signed)
Pt in via POV, reports mechanical fall today at work, hitting head on table, LOC unknown.  Complaints of headache, neck and lower back pain.  Reports taking Eliquis.  Ambulatory to triage, NAD noted at this time.

## 2019-08-14 NOTE — ED Notes (Signed)
Pt reports she slipped, had a fall this morning at approx 645am, hit her head "saw stars". Reports taking eliquis, neck and back pain, dizziness. Reports hitting tail bone as well.

## 2019-08-14 NOTE — ED Notes (Signed)
Pt to XRAY at this time.

## 2019-08-21 ENCOUNTER — Ambulatory Visit: Payer: BC Managed Care – PPO | Attending: Internal Medicine

## 2019-08-21 DIAGNOSIS — Z23 Encounter for immunization: Secondary | ICD-10-CM

## 2019-08-21 NOTE — Progress Notes (Signed)
   Covid-19 Vaccination Clinic  Name:  Shirley Rogers    MRN: [id number] DOB: 1965/12/08  08/21/2019  Shirley Rogers was observed post Covid-19 immunization for 15 minutes without incident. She was provided with Vaccine Information Sheet and instruction to access the V-Safe system.   Shirley Rogers was instructed to call 911 with any severe reactions post vaccine: Marland Kitchen Difficulty breathing  . Swelling of face and throat  . A fast heartbeat  . A bad rash all over body  . Dizziness and weakness   Immunizations Administered    Name Date Dose VIS Date Route   Pfizer COVID-19 Vaccine 08/21/2019  2:01 PM 0.3 mL 05/10/2019 Intramuscular   Manufacturer: [place]   Lot: [id number]   [place]: [id number]

## 2019-09-05 ENCOUNTER — Ambulatory Visit: Payer: BC Managed Care – PPO | Admitting: Family Medicine

## 2019-09-06 ENCOUNTER — Ambulatory Visit (INDEPENDENT_AMBULATORY_CARE_PROVIDER_SITE_OTHER): Payer: BC Managed Care – PPO | Admitting: Family Medicine

## 2019-09-06 ENCOUNTER — Other Ambulatory Visit: Payer: Self-pay

## 2019-09-06 ENCOUNTER — Encounter: Payer: Self-pay | Admitting: Family Medicine

## 2019-09-06 VITALS — BP 168/103 | HR 77 | Temp 98.6°F | Ht 64.37 in | Wt 251.6 lb

## 2019-09-06 DIAGNOSIS — M9900 Segmental and somatic dysfunction of head region: Secondary | ICD-10-CM

## 2019-09-06 DIAGNOSIS — S060X0A Concussion without loss of consciousness, initial encounter: Secondary | ICD-10-CM

## 2019-09-06 NOTE — Progress Notes (Signed)
BP (!) 168/103 (BP Location: Left Arm, Patient Position: Sitting, Cuff Size: Normal)   Pulse 77   Temp 98.6 F (37 C) (Oral)   Ht 5' 4.37" (1.635 m)   Wt 251 lb 9.6 oz (114.1 kg)   LMP 07/01/2013 Comment: tubal  SpO2 98%   BMI 42.69 kg/m    Subjective:    Patient ID: Shirley Rogers, female    DOB: 04-25-1966, 54 y.o.   MRN: [id number]  HPI: Shirley Rogers is a 54 y.o. female  Chief Complaint  Patient presents with  . Concussion    happened around 3/30  . Dizziness  . losing balance    falling to the left  . Insomnia  . Memory Loss    word finding, recalling names, directions, every day activities.    ER FOLLOW UP Time since discharge: 3.5 weeks Hospital/facility: [facility] Diagnosis: Concussion Procedures/tests:  CT Head Wo Contrast  Result Date: 08/14/2019 CLINICAL DATA:  Patient fell backwards at work hitting the back of her head. Head and neck pain. Past hx head trauma/concussions. Right side stroke. EXAM: CT HEAD WITHOUT CONTRAST CT CERVICAL SPINE WITHOUT CONTRAST TECHNIQUE: Multidetector CT imaging of the head and cervical spine was performed following the standard protocol without intravenous contrast. Multiplanar CT image reconstructions of the cervical spine were also generated. COMPARISON:  None. FINDINGS: CT HEAD FINDINGS Brain: Small focus of hypoattenuation noted along the posterior right external capsule and adjacent basal ganglia, consistent old infarct, indeterminate chronicity, likely old. Well-defined dilated perivascular space along inferior left basal ganglia. No other evidence of an infarct, no parenchymal masses or mass effect, no extra-axial masses or abnormal fluid collections and no intracranial hemorrhage. Ventricles are normal in size and configuration Vascular: No hyperdense vessel or unexpected calcification. Skull: Normal. Negative for fracture or focal lesion. Sinuses/Orbits: Globes and orbits are unremarkable. Visualized sinuses and mastoid air cells  are clear. Other: None. CT CERVICAL SPINE FINDINGS Alignment: Mild reversal of the normal cervical lordosis, apex at C5-C6. No spondylolisthesis/subluxation. Skull base and vertebrae: No acute fracture. No primary bone lesion or focal pathologic process. Soft tissues and spinal canal: No prevertebral fluid or swelling. No visible canal hematoma. Disc levels: Moderate loss of disc height with endplate sclerosis osteophytes at C5-C6. Mild loss of disc height at C6-C7. Mild diffuse spondylotic disc bulging at C5-C6 with uncovertebral spurring causing mild bilateral neural foraminal narrowing. No other disc bulging. No evidence of a disc herniation. No other stenosis. Upper chest: Enlarged right thyroid lobe with an apparent 1.9 cm nodule. No masses. No enlarged lymph nodes. Lung apices are clear. Other: None. IMPRESSION: HEAD CT 1. Small area of hypoattenuation along the posterior aspect of the right external capsule consistent with a lacunar infarction. This is of unclear chronicity, but likely old. 2. No other significant intracranial abnormality. 3. No skull fracture. CERVICAL CT 1. No fracture or acute finding. 2. Disc degenerative changes at C5-C6. 3. Poorly defined right thyroid nodule fell approximately 1.9 cm. Recommend follow-up ultrasound for further assessment. Electronically Signed   By: Lajean Manes M.D.   On: 08/14/2019 12:17   CT Cervical Spine Wo Contrast  Result Date: 08/14/2019 CLINICAL DATA:  Patient fell backwards at work hitting the back of her head. Head and neck pain. Past hx head trauma/concussions. Right side stroke. EXAM: CT HEAD WITHOUT CONTRAST CT CERVICAL SPINE WITHOUT CONTRAST TECHNIQUE: Multidetector CT imaging of the head and cervical spine was performed following the standard protocol without intravenous contrast. Multiplanar CT image  reconstructions of the cervical spine were also generated. COMPARISON:  None. FINDINGS: CT HEAD FINDINGS Brain: Small focus of hypoattenuation  noted along the posterior right external capsule and adjacent basal ganglia, consistent old infarct, indeterminate chronicity, likely old. Well-defined dilated perivascular space along inferior left basal ganglia. No other evidence of an infarct, no parenchymal masses or mass effect, no extra-axial masses or abnormal fluid collections and no intracranial hemorrhage. Ventricles are normal in size and configuration Vascular: No hyperdense vessel or unexpected calcification. Skull: Normal. Negative for fracture or focal lesion. Sinuses/Orbits: Globes and orbits are unremarkable. Visualized sinuses and mastoid air cells are clear. Other: None. CT CERVICAL SPINE FINDINGS Alignment: Mild reversal of the normal cervical lordosis, apex at C5-C6. No spondylolisthesis/subluxation. Skull base and vertebrae: No acute fracture. No primary bone lesion or focal pathologic process. Soft tissues and spinal canal: No prevertebral fluid or swelling. No visible canal hematoma. Disc levels: Moderate loss of disc height with endplate sclerosis osteophytes at C5-C6. Mild loss of disc height at C6-C7. Mild diffuse spondylotic disc bulging at C5-C6 with uncovertebral spurring causing mild bilateral neural foraminal narrowing. No other disc bulging. No evidence of a disc herniation. No other stenosis. Upper chest: Enlarged right thyroid lobe with an apparent 1.9 cm nodule. No masses. No enlarged lymph nodes. Lung apices are clear. Other: None. IMPRESSION: HEAD CT 1. Small area of hypoattenuation along the posterior aspect of the right external capsule consistent with a lacunar infarction. This is of unclear chronicity, but likely old. 2. No other significant intracranial abnormality. 3. No skull fracture. CERVICAL CT 1. No fracture or acute finding. 2. Disc degenerative changes at C5-C6. 3. Poorly defined right thyroid nodule fell approximately 1.9 cm. Recommend follow-up ultrasound for further assessment. Electronically Signed   By: Lajean Manes M.D.   On: 08/14/2019 12:17  CLINICAL DATA:  Fall, back pain  EXAM: LUMBAR SPINE - COMPLETE 4+ VIEW  COMPARISON:  None.  FINDINGS: Degenerative disc disease at L4-5 and L5-S1. Degenerative facet disease diffusely throughout the lumbar spine, most pronounced in the lower lumbar spine. No fracture or malalignment. SI joints symmetric and unremarkable.  IMPRESSION: Degenerative disc and facet disease as above. No acute bony Abnormality.  CLINICAL DATA:  Fall, pain  EXAM: THORACIC SPINE 2 VIEWS  COMPARISON:  None.  FINDINGS: There is no evidence of thoracic spine fracture. Alignment is normal. No other significant bone abnormalities are identified.  IMPRESSION: No acute bony abnormality.  Consultants: None New medications: None Discharge instructions: Follow up here   Status: stable   Reiya presents today following her concussion. She's doing about the same. She continues with dizziness, confusion, lots of sleep, word finding difficulties, sensitivity to light. She has not been able to do much brain rest. 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 She notes that her headaches are bad, but not any different than previously, so she's not sure if that has been exacerbated by her concussion. She's otherwise doing OK.  Relevant past medical, surgical, family and social history reviewed and updated as indicated. Interim medical history since our last visit reviewed. Allergies and medications reviewed and updated.  Review of Systems  Constitutional: Negative.   Respiratory: Negative.   Gastrointestinal: Negative.   Endocrine: Negative.   Genitourinary: Negative.   Musculoskeletal: Negative.   Allergic/Immunologic: Negative.   Neurological: Positive for speech difficulty, light-headedness and headaches. Negative for dizziness, tremors, seizures, syncope, facial asymmetry, weakness and  numbness.    Psychiatric/Behavioral: Negative.     Per HPI unless specifically indicated above     Objective:    BP (!) 168/103 (BP Location: Left Arm, Patient Position: Sitting, Cuff Size: Normal)   Pulse 77   Temp 98.6 F (37 C) (Oral)   Ht 5' 4.37" (1.635 m)   Wt 251 lb 9.6 oz (114.1 kg)   LMP 07/01/2013 Comment: tubal  SpO2 98%   BMI 42.69 kg/m   Wt Readings from Last 3 Encounters:  09/06/19 251 lb 9.6 oz (114.1 kg)  08/14/19 240 lb (108.9 kg)  07/09/19 253 lb (114.8 kg)    Physical Exam Vitals and nursing note reviewed.  Constitutional:      General: She is not in acute distress.    Appearance: Normal appearance. She is not ill-appearing, toxic-appearing or diaphoretic.  HENT:     Head: Normocephalic and atraumatic.     Right Ear: External ear normal.     Left Ear: External ear normal.     Nose: Nose normal.     Mouth/Throat:     Mouth: Mucous membranes are moist.     Pharynx: Oropharynx is clear.  Eyes:     General: No scleral icterus.       Right eye: No discharge.        Left eye: No discharge.     Extraocular Movements: Extraocular movements intact.     Conjunctiva/sclera: Conjunctivae normal.     Pupils: Pupils are equal, round, and reactive to light.  Cardiovascular:     Rate and Rhythm: Normal rate and regular rhythm.     Pulses: Normal pulses.     Heart sounds: Normal heart sounds. No murmur. No friction rub. No gallop.   Pulmonary:     Effort: Pulmonary effort is normal. No respiratory distress.     Breath sounds: Normal breath sounds. No stridor. No wheezing, rhonchi or rales.  Chest:     Chest wall: No tenderness.  Musculoskeletal:        General: Normal range of motion.     Cervical back: Normal range of motion and neck supple.  Skin:    General: Skin is warm and dry.     Capillary Refill: Capillary refill takes less than 2 seconds.     Coloration: Skin is not jaundiced or pale.     Findings: No bruising, erythema, lesion or rash.  Neurological:      General: No focal deficit present.     Mental Status: She is alert and oriented to person, place, and time. Mental status is at baseline.     Coordination: Coordination abnormal.     Gait: Gait abnormal.  Psychiatric:        Mood and Affect: Mood normal.        Behavior: Behavior normal.        Thought Content: Thought content normal.        Judgment: Judgment normal.    Musculoskeletal:  Exam found Decreased ROM, Tissue texture changes, Tenderness to palpation and Asymmetry of patient's  head Osteopathic Structural Exam:   Head: OAESSL, hypertonic supboccipital muscles, L lateral strain, decreased cranial motion on the R, R temporal posterior and inferior   Results for orders placed or performed in visit on 08/02/19  Microscopic Examination   URINE  Result Value Ref Range   WBC, UA 6-10 (A) 0 - 5 /hpf   RBC 0-2 0 - 2 /hpf   Epithelial Cells (non renal) 0-10 0 -  10 /hpf   Bacteria, UA Moderate (A) None seen/Few  Urine Culture, Reflex   URINE  Result Value Ref Range   Urine Culture, Routine Final report    Organism ID, Bacteria No growth   Bayer DCA Hb A1c Waived  Result Value Ref Range   HB A1C (BAYER DCA - WAIVED) 5.2 <7.0 %  CBC with Differential/Platelet  Result Value Ref Range   WBC 5.6 3.4 - 10.8 x10E3/uL   RBC 4.49 3.77 - 5.28 x10E6/uL   Hemoglobin 13.9 11.1 - 15.9 g/dL   Hematocrit 40.4 34.0 - 46.6 %   MCV 90 79 - 97 fL   MCH 31.0 26.6 - 33.0 pg   MCHC 34.4 31.5 - 35.7 g/dL   RDW 12.1 11.7 - 15.4 %   Platelets 255 150 - 450 x10E3/uL   Neutrophils 53 Not Estab. %   Lymphs 35 Not Estab. %   Monocytes 9 Not Estab. %   Eos 2 Not Estab. %   Basos 1 Not Estab. %   Neutrophils Absolute 3.0 1.4 - 7.0 x10E3/uL   Lymphocytes Absolute 1.9 0.7 - 3.1 x10E3/uL   Monocytes Absolute 0.5 0.1 - 0.9 x10E3/uL   EOS (ABSOLUTE) 0.1 0.0 - 0.4 x10E3/uL   Basophils Absolute 0.0 0.0 - 0.2 x10E3/uL   Immature Granulocytes 0 Not Estab. %   Immature Grans (Abs) 0.0 0.0 - 0.1 x10E3/uL    Comprehensive metabolic panel  Result Value Ref Range   Glucose 86 65 - 99 mg/dL   BUN 19 6 - 24 mg/dL   Creatinine, Ser 0.95 0.57 - 1.00 mg/dL   GFR calc non Af Amer 68 >59 mL/min/1.73   GFR calc Af Amer 78 >59 mL/min/1.73   BUN/Creatinine Ratio 20 9 - 23   Sodium 141 134 - 144 mmol/L   Potassium 4.4 3.5 - 5.2 mmol/L   Chloride 98 96 - 106 mmol/L   CO2 28 20 - 29 mmol/L   Calcium 9.6 8.7 - 10.2 mg/dL   Total Protein 6.9 6.0 - 8.5 g/dL   Albumin 4.6 3.8 - 4.9 g/dL   Globulin, Total 2.3 1.5 - 4.5 g/dL   Albumin/Globulin Ratio 2.0 1.2 - 2.2   Bilirubin Total 0.2 0.0 - 1.2 mg/dL   Alkaline Phosphatase 61 39 - 117 IU/L   AST 16 0 - 40 IU/L   ALT 11 0 - 32 IU/L  Lipid Panel w/o Chol/HDL Ratio  Result Value Ref Range   Cholesterol, Total 237 (H) 100 - 199 mg/dL   Triglycerides 148 0 - 149 mg/dL   HDL 50 >39 mg/dL   VLDL Cholesterol Cal 27 5 - 40 mg/dL   LDL Chol Calc (NIH) 160 (H) 0 - 99 mg/dL  Microalbumin, Urine Waived  Result Value Ref Range   Microalb, Ur Waived 80 (H) 0 - 19 mg/L   Creatinine, Urine Waived 100 10 - 300 mg/dL   Microalb/Creat Ratio 30-300 (H) <30 mg/g  TSH  Result Value Ref Range   TSH 1.360 0.450 - 4.500 uIU/mL  UA/M w/rflx Culture, Routine   Specimen: Urine   URINE  Result Value Ref Range   Specific Gravity, UA >1.030 (H) 1.005 - 1.030   pH, UA 7.0 5.0 - 7.5   Color, UA Yellow Yellow   Appearance Ur Clear Clear   Leukocytes,UA Negative Negative   Protein,UA Trace (A) Negative/Trace   Glucose, UA Negative Negative   Ketones, UA Negative Negative   RBC, UA Negative Negative   Bilirubin, UA  Negative Negative   Urobilinogen, Ur 2.0 (H) 0.2 - 1.0 mg/dL   Nitrite, UA Negative Negative   Microscopic Examination See below:    Urinalysis Reflex Comment       Assessment & Plan:   Problem List Items Addressed This Visit    None    Visit Diagnoses    Concussion without loss of consciousness, initial encounter    -  Primary   Discussed brain rest.  She has somatic dysfunction contributing to her symptoms. She was treated today with OMT for good results. Call with any concerns.    Head region somatic dysfunction        After verbal consent was obtained, patient was treated today with osteopathic manipulative medicine to the regions of the head using the techniques of cranial. Areas of compensation relating to her primary pain source also treated. Patient tolerated the procedure well with good objective and fair subjective improvement in symptoms. She left the room in good condition. She was advised to stay well hydrated and that she may have some soreness following the procedure. If not improving or worsening, she will call and come in. She will return for reevaluation  in 1-2 weeks if needed.   Follow up plan: Return in about 1 week (around 09/13/2019) for OMM if needed.

## 2019-09-11 ENCOUNTER — Telehealth: Payer: BC Managed Care – PPO | Admitting: Family Medicine

## 2019-09-20 ENCOUNTER — Other Ambulatory Visit: Payer: Self-pay | Admitting: Family Medicine

## 2019-10-06 ENCOUNTER — Other Ambulatory Visit: Payer: Self-pay | Admitting: Family Medicine

## 2019-10-27 ENCOUNTER — Other Ambulatory Visit: Payer: Self-pay | Admitting: Family Medicine

## 2019-12-05 ENCOUNTER — Other Ambulatory Visit: Payer: Self-pay | Admitting: Family Medicine

## 2019-12-05 NOTE — Telephone Encounter (Signed)
Requested Prescriptions  Pending Prescriptions Disp Refills  . verapamil (CALAN-SR) 240 MG CR tablet [Pharmacy Med Name: VERAPAMIL ER 240MG  TABLETS] 90 tablet 0    Sig: TAKE 1 TABLET(240 MG) BY MOUTH DAILY     Cardiovascular:  Calcium Channel Blockers Failed - 12/05/2019  4:14 PM      Failed - Last BP in normal range    BP Readings from Last 1 Encounters:  09/06/19 (!) 168/103         Passed - Valid encounter within last 6 months    Recent Outpatient Visits          3 months ago Concussion without loss of consciousness, initial encounter   [employer], Megan P, DO   3 months ago Osteoarthritis of right knee, unspecified osteoarthritis type   [place], Megan P, DO   4 months ago Chronic pain of left knee   [place], Megan P, DO   7 months ago Lower abdominal pain   [place], [place], DO   7 months ago [place], [place], DO

## 2019-12-08 ENCOUNTER — Emergency Department
Admission: EM | Admit: 2019-12-08 | Discharge: 2019-12-08 | Disposition: A | Discharge status: Home / Self Care | Payer: BC Managed Care – PPO | Attending: Emergency Medicine | Admitting: Emergency Medicine

## 2019-12-08 ENCOUNTER — Other Ambulatory Visit: Payer: Self-pay

## 2019-12-08 DIAGNOSIS — Z5321 Procedure and treatment not carried out due to patient leaving prior to being seen by health care provider: Secondary | ICD-10-CM | POA: Insufficient documentation

## 2019-12-08 LAB — COMPREHENSIVE METABOLIC PANEL
ALT: 17 U/L (ref 0–44)
AST: 20 U/L (ref 15–41)
Albumin: 4.7 g/dL (ref 3.5–5.0)
Alkaline Phosphatase: 48 U/L (ref 38–126)
Anion gap: 12 (ref 5–15)
BUN: 14 mg/dL (ref 6–20)
CO2: 22 mmol/L (ref 22–32)
Calcium: 9.1 mg/dL (ref 8.9–10.3)
Chloride: 106 mmol/L (ref 98–111)
Creatinine, Ser: 0.94 mg/dL (ref 0.44–1.00)
GFR calc Af Amer: >60 mL/min (ref >60)
GFR calc non Af Amer: >60 mL/min (ref >60)
Glucose, Bld: 96 mg/dL (ref 70–99)
Potassium: 3.6 mmol/L (ref 3.5–5.1)
Sodium: 140 mmol/L (ref 135–145)
Total Bilirubin: 0.8 mg/dL (ref 0.3–1.2)
Total Protein: 7.5 g/dL (ref 6.5–8.1)

## 2019-12-08 LAB — URINALYSIS, COMPLETE (UACMP) WITH MICROSCOPIC
Glucose, UA: NEGATIVE mg/dL
Ketones, ur: NEGATIVE mg/dL
Leukocytes,Ua: NEGATIVE
Nitrite: NEGATIVE
Protein, ur: 100 mg/dL — AB
RBC / HPF: >50 RBC/hpf — ABNORMAL HIGH (ref 0–5)
Specific Gravity, Urine: 1.029 (ref 1.005–1.030)
pH: 5 (ref 5.0–8.0)

## 2019-12-08 LAB — CBC
HCT: 42.4 % (ref 36.0–46.0)
Hemoglobin: 14.6 g/dL (ref 12.0–15.0)
MCH: 30.7 pg (ref 26.0–34.0)
MCHC: 34.4 g/dL (ref 30.0–36.0)
MCV: 89.1 fL (ref 80.0–100.0)
Platelets: 226 10*3/uL (ref 150–400)
RBC: 4.76 MIL/uL (ref 3.87–5.11)
RDW: 11.9 % (ref 11.5–15.5)
WBC: 6.4 10*3/uL (ref 4.0–10.5)
nRBC: 0 % (ref 0.0–0.2)

## 2019-12-08 LAB — LIPASE, BLOOD: Lipase: 27 U/L (ref 11–51)

## 2019-12-08 NOTE — ED Triage Notes (Signed)
Patient reports symptoms for 3 weeks, mid abdomen. Reports now her belly button has liquid coming out of it.  Also reports nausea and vomiting.

## 2019-12-09 ENCOUNTER — Telehealth: Payer: Self-pay | Admitting: Emergency Medicine

## 2019-12-09 ENCOUNTER — Encounter: Payer: Self-pay | Admitting: Family Medicine

## 2019-12-09 NOTE — Telephone Encounter (Signed)
Called patient due to lwot to inquire about condition and follow up plans.  No answer and no voicemail  

## 2019-12-12 ENCOUNTER — Telehealth (INDEPENDENT_AMBULATORY_CARE_PROVIDER_SITE_OTHER): Payer: BC Managed Care – PPO | Admitting: Family Medicine

## 2019-12-12 ENCOUNTER — Encounter: Payer: Self-pay | Admitting: Family Medicine

## 2019-12-12 VITALS — BP 141/98 | Temp 98.8°F

## 2019-12-12 DIAGNOSIS — N200 Calculus of kidney: Secondary | ICD-10-CM

## 2019-12-12 DIAGNOSIS — R310 Gross hematuria: Secondary | ICD-10-CM

## 2019-12-12 DIAGNOSIS — R1030 Lower abdominal pain, unspecified: Secondary | ICD-10-CM

## 2019-12-12 DIAGNOSIS — R112 Nausea with vomiting, unspecified: Secondary | ICD-10-CM | POA: Diagnosis not present

## 2019-12-12 MED ORDER — HYDROCODONE-ACETAMINOPHEN 5-325 MG PO TABS: 1.0000 | ORAL_TABLET | ORAL | 0 refills | Status: DC | PRN

## 2019-12-12 MED ORDER — TAMSULOSIN HCL 0.4 MG PO CAPS: 0.4000 mg | ORAL_CAPSULE | Freq: Every day | ORAL | 0 refills | Status: AC

## 2019-12-12 NOTE — Assessment & Plan Note (Signed)
x2 weeks, worsening. Significant hematuria on UA in ER. Will obtain CT to look for stone and treat with flomax and vicodin. Await results.

## 2019-12-12 NOTE — Progress Notes (Signed)
BP (!) 141/98   Temp 98.8 F (37.1 C) (Temporal)   LMP 07/01/2013 Comment: tubal   Subjective:    Patient ID: Shirley Rogers, female    DOB: [birth year], 54 y.o.   MRN: [id number]  HPI: Shirley Rogers is a 55 y.o. female  Chief Complaint  Patient presents with  . Back Pain  . painful urination  . Bloated  . Knee injection    Pt inquiring if it is time   URINARY SYMPTOMS Duration: 2 weeks Dysuria: burning Urinary frequency: no Urgency: yes Small volume voids: yes Symptom severity: severe Urinary incontinence: no Foul odor: no Hematuria: yes Abdominal pain: yes Back pain: yes Suprapubic pain/pressure: yes Flank pain: yes Fever:  subjective Vomiting: no Relief with cranberry juice: no Relief with pyridium: no Status: worse Previous urinary tract infection: yes Recurrent urinary tract infection: no History of sexually transmitted disease: no Vaginal discharge: no Treatments attempted: increasing fluids   Relevant past medical, surgical, family and social history reviewed and updated as indicated. Interim medical history since our last visit reviewed. Allergies and medications reviewed and updated.  Review of Systems  Constitutional: Positive for chills, diaphoresis and fever. Negative for activity change, appetite change, fatigue and unexpected weight change.  Respiratory: Negative.   Cardiovascular: Negative.   Gastrointestinal: Positive for abdominal distention, abdominal pain, diarrhea and nausea. Negative for anal bleeding, blood in stool, constipation, rectal pain and vomiting.  Genitourinary: Positive for decreased urine volume, difficulty urinating, dysuria, flank pain, frequency, hematuria, pelvic pain and urgency. Negative for dyspareunia, enuresis, genital sores, menstrual problem, vaginal bleeding, vaginal discharge and vaginal pain.  Musculoskeletal: Positive for back pain and myalgias. Negative for arthralgias, gait problem, joint swelling, neck pain and  neck stiffness.  Neurological: Negative.   Psychiatric/Behavioral: Negative.     Per HPI unless specifically indicated above     Objective:    BP (!) 141/98   Temp 98.8 F (37.1 C) (Temporal)   LMP 07/01/2013 Comment: tubal  Wt Readings from Last 3 Encounters:  12/08/19 240 lb (108.9 kg)  09/06/19 251 lb 9.6 oz (114.1 kg)  08/14/19 240 lb (108.9 kg)    Physical Exam Vitals and nursing note reviewed.  Constitutional:      General: She is not in acute distress.    Appearance: Normal appearance. She is not ill-appearing, toxic-appearing or diaphoretic.  HENT:     Head: Normocephalic and atraumatic.     Right Ear: External ear normal.     Left Ear: External ear normal.     Nose: Nose normal.     Mouth/Throat:     Mouth: Mucous membranes are moist.     Pharynx: Oropharynx is clear.  Eyes:     General: No scleral icterus.       Right eye: No discharge.        Left eye: No discharge.     Conjunctiva/sclera: Conjunctivae normal.     Pupils: Pupils are equal, round, and reactive to light.  Pulmonary:     Effort: Pulmonary effort is normal. No respiratory distress.     Comments: Speaking in full sentences Musculoskeletal:        General: Normal range of motion.     Cervical back: Normal range of motion.  Skin:    Coloration: Skin is not jaundiced or pale.     Findings: No bruising, erythema, lesion or rash.  Neurological:     Mental Status: She is alert and oriented to person, place, and time.  Mental status is at baseline.  Psychiatric:        Mood and Affect: Mood normal.        Behavior: Behavior normal.        Thought Content: Thought content normal.        Judgment: Judgment normal.     Results for orders placed or performed during the hospital encounter of 12/08/19  Lipase, blood  Result Value Ref Range   Lipase 27 11 - 51 U/L  Comprehensive metabolic panel  Result Value Ref Range   Sodium 140 135 - 145 mmol/L   Potassium 3.6 3.5 - 5.1 mmol/L   Chloride 106  98 - 111 mmol/L   CO2 22 22 - 32 mmol/L   Glucose, Bld 96 70 - 99 mg/dL   BUN 14 6 - 20 mg/dL   Creatinine, Ser 0.94 0.44 - 1.00 mg/dL   Calcium 9.1 8.9 - 10.3 mg/dL   Total Protein 7.5 6.5 - 8.1 g/dL   Albumin 4.7 3.5 - 5.0 g/dL   AST 20 15 - 41 U/L   ALT 17 0 - 44 U/L   Alkaline Phosphatase 48 38 - 126 U/L   Total Bilirubin 0.8 0.3 - 1.2 mg/dL   GFR calc non Af Amer >60 >60 mL/min   GFR calc Af Amer >60 >60 mL/min   Anion gap 12 5 - 15  CBC  Result Value Ref Range   WBC 6.4 4.0 - 10.5 K/uL   RBC 4.76 3.87 - 5.11 MIL/uL   Hemoglobin 14.6 12.0 - 15.0 g/dL   HCT 42.4 36 - 46 %   MCV 89.1 80.0 - 100.0 fL   MCH 30.7 26.0 - 34.0 pg   MCHC 34.4 30.0 - 36.0 g/dL   RDW 11.9 11.5 - 15.5 %   Platelets 226 150 - 400 K/uL   nRBC 0.0 0.0 - 0.2 %  Urinalysis, Complete w Microscopic  Result Value Ref Range   Color, Urine AMBER (A) YELLOW   APPearance HAZY (A) CLEAR   Specific Gravity, Urine 1.029 1.005 - 1.030   pH 5.0 5.0 - 8.0   Glucose, UA NEGATIVE NEGATIVE mg/dL   Hgb urine dipstick MODERATE (A) NEGATIVE   Bilirubin Urine SMALL (A) NEGATIVE   Ketones, ur NEGATIVE NEGATIVE mg/dL   Protein, ur 100 (A) NEGATIVE mg/dL   Nitrite NEGATIVE NEGATIVE   Leukocytes,Ua NEGATIVE NEGATIVE   RBC / HPF >50 (H) 0 - 5 RBC/hpf   WBC, UA 6-10 0 - 5 WBC/hpf   Bacteria, UA RARE (A) NONE SEEN   Squamous Epithelial / LPF 0-5 0 - 5   Mucus PRESENT    Hyaline Casts, UA PRESENT       Assessment & Plan:   Problem List Items Addressed This Visit      Genitourinary   Nephrolithiasis    x2 weeks, worsening. Significant hematuria on UA in ER. Will obtain CT to look for stone and treat with flomax and vicodin. Await results.       Relevant Medications   HYDROcodone-acetaminophen (NORCO/VICODIN) 5-325 MG tablet   Other Relevant Orders   CT Abdomen Pelvis Wo Contrast    Other Visit Diagnoses    Gross hematuria    -  Primary   Concern for stone with history. Will treat with flomax and vicodin.  Check CT to r/o stone. Await results. Increase fluids.    Relevant Orders   CT Abdomen Pelvis Wo Contrast   Lower abdominal pain  Relevant Orders   CT Abdomen Pelvis Wo Contrast   Nausea and vomiting, intractability of vomiting not specified, unspecified vomiting type       Relevant Orders   CT Abdomen Pelvis Wo Contrast       Follow up plan: Return if symptoms worsen or fail to improve.    . This visit was completed via MyChart due to the restrictions of the COVID-19 pandemic. All issues as above were discussed and addressed. Physical exam was done as above through visual confirmation on MyChart. If it was felt that the patient should be evaluated in the office, they were directed there. The patient verbally consented to this visit. . Location of the patient: work . Location of the provider: work . Those involved with this call:  . Provider: Park Liter, DO . CMA: Lauretta Grill, RMA . Front Desk/Registration: Don Perking  . Time spent on call: 15 minutes with patient face to face via video conference. More than 50% of this time was spent in counseling and coordination of care. 23 minutes total spent in review of patient's record and preparation of their chart.

## 2019-12-13 ENCOUNTER — Telehealth: Payer: Self-pay | Admitting: Family Medicine

## 2019-12-13 DIAGNOSIS — N200 Calculus of kidney: Secondary | ICD-10-CM

## 2019-12-13 MED ORDER — HYDROCODONE-ACETAMINOPHEN 5-325 MG PO TABS: 1.0000 | ORAL_TABLET | ORAL | 0 refills | Status: AC | PRN

## 2019-12-13 NOTE — Telephone Encounter (Signed)
Pt was told at her video visit that Vicodin would be sent into pharmacy.  She says when she got there, no rx for pain medicine.  Pt is in a lot of pain.  Thank you

## 2019-12-18 ENCOUNTER — Ambulatory Visit: Payer: BC Managed Care – PPO | Attending: Family Medicine

## 2019-12-20 ENCOUNTER — Ambulatory Visit: Payer: BC Managed Care – PPO

## 2021-01-01 ENCOUNTER — Telehealth: Payer: BC Managed Care – PPO

## 2021-01-01 NOTE — Telephone Encounter (Signed)
Copied from [place] [id number]. Topic: General - Inquiry >> Jan 01, 2021  1:29 PM Oneta Rack wrote: Osvaldo Human name: Shirley Rogers  Relation to pt: from [employer] company Call back number: [phone]    Reason for call:  Requesting confirmation if insurance is active  company would like to send C PAP supplies to patient

## 2021-01-04 NOTE — Telephone Encounter (Signed)
Spoke with [employer] regarding patients C PAP supplies. Informed Donnie the representative from [place], that patient hasn't been seen here in our office since July of last year 2021. Patient does not have any scheduled upcoming appointments with our office. Donnie states she would reach out to patient regarding updated or active Set designer.
